# Patient Record
Sex: Male | Born: 1945 | Hispanic: No | Marital: Married | Smoking: Never smoker
Health system: Southern US, Community
[De-identification: ages and names within clinical notes are randomized; demographics above are authoritative.]

## PROBLEM LIST (undated history)

## (undated) DIAGNOSIS — B54 Unspecified malaria: Secondary | ICD-10-CM

## (undated) DIAGNOSIS — A159 Respiratory tuberculosis unspecified: Secondary | ICD-10-CM

## (undated) DIAGNOSIS — D649 Anemia, unspecified: Secondary | ICD-10-CM

## (undated) DIAGNOSIS — C9000 Multiple myeloma not having achieved remission: Secondary | ICD-10-CM

## (undated) DIAGNOSIS — K769 Liver disease, unspecified: Secondary | ICD-10-CM

## (undated) DIAGNOSIS — C801 Malignant (primary) neoplasm, unspecified: Secondary | ICD-10-CM

## (undated) DIAGNOSIS — R490 Dysphonia: Secondary | ICD-10-CM

## (undated) DIAGNOSIS — K5900 Constipation, unspecified: Secondary | ICD-10-CM

## (undated) DIAGNOSIS — H9190 Unspecified hearing loss, unspecified ear: Secondary | ICD-10-CM

## (undated) MED ORDER — SODIUM CHLORIDE 0.9 % IV SOLN
4.00 mg | Freq: Once | INTRAVENOUS | Status: AC
Start: 2016-08-02 — End: 2016-07-29

## (undated) MED ORDER — DIPHENHYDRAMINE HCL 50 MG/ML IJ SOLN
25.00 mg | Freq: Once | INTRAMUSCULAR | Status: AC | PRN
Start: 2017-12-15 — End: 2017-12-15

## (undated) MED ORDER — ALBUTEROL SULFATE (5 MG/ML) 0.5% IN NEBU
2.50 mg | INHALATION_SOLUTION | Freq: Once | RESPIRATORY_TRACT | Status: AC | PRN
Start: 2017-12-15 — End: 2017-12-15

## (undated) MED ORDER — SODIUM CHLORIDE 0.9 % IV SOLN
4.00 mg | Freq: Once | INTRAVENOUS | Status: AC
Start: 2016-10-21 — End: 2016-10-21

## (undated) MED ORDER — ACETAMINOPHEN 325 MG PO TABS
650.00 mg | ORAL_TABLET | Freq: Once | ORAL | Status: AC | PRN
Start: 2017-12-29 — End: 2017-12-29

## (undated) MED ORDER — SODIUM CHLORIDE 0.9 % IV SOLN
INTRAVENOUS | Status: AC
Start: 2017-11-03 — End: 2017-11-03

## (undated) MED ORDER — SODIUM CHLORIDE 0.9 % IV SOLN
12.00 mg | Freq: Once | INTRAVENOUS | Status: AC | PRN
Start: 2017-12-15 — End: 2017-12-15

## (undated) MED ORDER — CETIRIZINE HCL 10 MG OR TABS
10.00 mg | ORAL_TABLET | Freq: Once | ORAL | Status: AC | PRN
Start: 2017-12-15 — End: 2017-12-15

## (undated) MED ORDER — CETIRIZINE HCL 10 MG OR TABS
10.00 mg | ORAL_TABLET | Freq: Once | ORAL | Status: AC | PRN
Start: 2017-11-03 — End: 2017-11-03

## (undated) MED ORDER — SODIUM CHLORIDE 0.9 % IV SOLN
INTRAVENOUS | Status: AC
Start: 2017-12-15 — End: 2017-12-15

## (undated) MED ORDER — SODIUM CHLORIDE 0.9 % IV SOLN
INTRAVENOUS | Status: AC
Start: 2017-10-13 — End: 2017-10-13

## (undated) MED ORDER — ZOLEDRONIC ACID 4 MG/5ML IV CONC
4.00 mg | Freq: Once | INTRAVENOUS | Status: AC
Start: 2016-10-25 — End: 2016-10-25

## (undated) MED ORDER — SODIUM CHLORIDE 0.9 % IV SOLN
16.00 mg/kg | Freq: Once | INTRAVENOUS | Status: AC
Start: 2017-10-27 — End: 2017-10-27

## (undated) MED ORDER — DIPHENHYDRAMINE HCL 50 MG/ML IJ SOLN
25.00 mg | Freq: Once | INTRAMUSCULAR | Status: AC | PRN
Start: 2017-11-03 — End: 2017-11-03

## (undated) MED ORDER — MEPERIDINE HCL 25 MG/ML IJ SOLN
25.00 mg | Freq: Once | INTRAMUSCULAR | Status: AC | PRN
Start: 2017-11-03 — End: ?

## (undated) MED ORDER — CETIRIZINE HCL 10 MG OR TABS
10.00 mg | ORAL_TABLET | Freq: Once | ORAL | Status: AC
Start: 2017-10-13 — End: 2017-10-13

## (undated) MED ORDER — DIPHENHYDRAMINE HCL 50 MG/ML IJ SOLN
25.00 mg | Freq: Once | INTRAMUSCULAR | Status: AC | PRN
Start: 2017-11-17 — End: 2017-11-17

## (undated) MED ORDER — MEPERIDINE HCL 25 MG/ML IJ SOLN
25.00 mg | Freq: Once | INTRAMUSCULAR | Status: AC | PRN
Start: 2017-10-27 — End: ?

## (undated) MED ORDER — MEPERIDINE HCL 25 MG/ML IJ SOLN
25.00 mg | Freq: Once | INTRAMUSCULAR | Status: AC | PRN
Start: 2017-12-29 — End: ?

## (undated) MED ORDER — DIPHENHYDRAMINE HCL 50 MG/ML IJ SOLN
25.00 mg | Freq: Once | INTRAMUSCULAR | Status: AC | PRN
Start: 2017-12-29 — End: 2017-12-29

## (undated) MED ORDER — ACETAMINOPHEN 325 MG PO TABS
650.00 mg | ORAL_TABLET | Freq: Once | ORAL | Status: AC | PRN
Start: 2017-11-17 — End: 2017-11-17

## (undated) MED ORDER — SODIUM CHLORIDE 0.9 % IV SOLN
10.00 mL/h | Freq: Once | INTRAVENOUS | Status: AC
Start: 2016-08-02 — End: 2016-08-02

## (undated) MED ORDER — CETIRIZINE HCL 10 MG OR TABS
10.00 mg | ORAL_TABLET | Freq: Once | ORAL | Status: AC | PRN
Start: 2017-12-29 — End: 2017-12-29

## (undated) MED ORDER — ACETAMINOPHEN 325 MG PO TABS
650.00 mg | ORAL_TABLET | Freq: Once | ORAL | Status: AC
Start: 2017-10-13 — End: 2017-10-13

## (undated) MED ORDER — DIPHENHYDRAMINE HCL 50 MG/ML IJ SOLN
25.00 mg | Freq: Once | INTRAMUSCULAR | Status: AC | PRN
Start: 2017-10-20 — End: 2017-10-20

## (undated) MED ORDER — MEPERIDINE HCL 25 MG/ML IJ SOLN
25.00 mg | Freq: Once | INTRAMUSCULAR | Status: AC | PRN
Start: 2017-10-20 — End: ?

## (undated) MED ORDER — SODIUM CHLORIDE 0.9 % IV SOLN
INTRAVENOUS | Status: AC
Start: 2017-12-01 — End: 2017-12-01

## (undated) MED ORDER — ACETAMINOPHEN 325 MG PO TABS
650.00 mg | ORAL_TABLET | Freq: Once | ORAL | Status: AC | PRN
Start: 2017-12-01 — End: 2017-12-01

## (undated) MED ORDER — ALBUTEROL SULFATE (5 MG/ML) 0.5% IN NEBU
2.50 mg | INHALATION_SOLUTION | Freq: Once | RESPIRATORY_TRACT | Status: AC | PRN
Start: 2017-11-17 — End: 2017-11-17

## (undated) MED ORDER — MEPERIDINE HCL 25 MG/ML IJ SOLN
25.00 mg | Freq: Once | INTRAMUSCULAR | Status: AC | PRN
Start: 2017-12-01 — End: ?

## (undated) MED ORDER — SODIUM CHLORIDE 0.9 % IV BOLUS
250.00 mL | INJECTION | Freq: Once | INTRAVENOUS | Status: AC
Start: 2016-08-02 — End: 2016-08-02

## (undated) MED ORDER — DARATUMUMAB 400 MG/20ML IV SOLN
16.00 mg/kg | Freq: Once | INTRAVENOUS | Status: AC
Start: 2017-12-15 — End: 2017-12-15

## (undated) MED ORDER — SODIUM CHLORIDE 0.9 % IV BOLUS
250.00 mL | INJECTION | Freq: Once | INTRAVENOUS | Status: AC
Start: 2017-04-13 — End: 2017-04-13

## (undated) MED ORDER — DIPHENHYDRAMINE HCL 50 MG/ML IJ SOLN
25.00 mg | Freq: Once | INTRAMUSCULAR | Status: AC | PRN
Start: 2017-10-13 — End: 2017-10-13

## (undated) MED ORDER — MONTELUKAST SODIUM 10 MG OR TABS
10.00 mg | ORAL_TABLET | Freq: Once | ORAL | Status: AC | PRN
Start: 2017-10-20 — End: 2017-10-20

## (undated) MED ORDER — SODIUM CHLORIDE 0.9 % IV BOLUS
250.00 mL | INJECTION | Freq: Once | INTRAVENOUS | Status: AC
Start: 2017-01-19 — End: 2017-01-19

## (undated) MED ORDER — SODIUM CHLORIDE 0.9 % IV SOLN
INTRAVENOUS | Status: AC
Start: 2017-10-27 — End: 2017-10-27

## (undated) MED ORDER — SODIUM CHLORIDE 0.9 % IV BOLUS
250.00 mL | INJECTION | Freq: Once | INTRAVENOUS | Status: AC
Start: 2017-04-14 — End: 2017-04-14

## (undated) MED ORDER — SODIUM CHLORIDE 0.9 % IV SOLN
20.00 mg | Freq: Once | INTRAVENOUS | Status: AC
Start: 2017-10-13 — End: 2017-10-13

## (undated) MED ORDER — SODIUM CHLORIDE 0.9 % IV SOLN
10.00 mL/h | Freq: Once | INTRAVENOUS | Status: AC
Start: 2016-10-25 — End: 2016-10-25

## (undated) MED ORDER — ZOLEDRONIC ACID 4 MG/5ML IV CONC
4.00 mg | Freq: Once | INTRAVENOUS | Status: AC
Start: 2017-01-19 — End: 2017-01-19

## (undated) MED ORDER — CETIRIZINE HCL 10 MG OR TABS
10.00 mg | ORAL_TABLET | Freq: Once | ORAL | Status: AC | PRN
Start: 2017-10-27 — End: 2017-10-27

## (undated) MED ORDER — SODIUM CHLORIDE 0.9 % IV SOLN
16.00 mg/kg | Freq: Once | INTRAVENOUS | Status: AC
Start: 2017-12-01 — End: 2017-12-01

## (undated) MED ORDER — SODIUM CHLORIDE 0.9 % IV SOLN
4.00 mg | Freq: Once | INTRAVENOUS | Status: AC
Start: 2017-10-13 — End: 2017-05-15

## (undated) MED ORDER — DIPHENHYDRAMINE HCL 25 MG OR TABS OR CAPS CUSTOM
25.00 mg | ORAL_CAPSULE | Freq: Once | ORAL | Status: AC | PRN
Start: 2017-10-13 — End: 2017-10-13

## (undated) MED ORDER — SODIUM CHLORIDE 0.9 % IV SOLN
INTRAVENOUS | Status: AC
Start: 2017-10-20 — End: 2017-10-20

## (undated) MED ORDER — SODIUM CHLORIDE 0.9 % IV SOLN
10.00 mL/h | Freq: Once | INTRAVENOUS | Status: AC
Start: 2016-10-21 — End: 2016-10-21

## (undated) MED ORDER — ALBUTEROL SULFATE (5 MG/ML) 0.5% IN NEBU
2.50 mg | INHALATION_SOLUTION | Freq: Once | RESPIRATORY_TRACT | Status: AC | PRN
Start: 2017-10-13 — End: 2017-10-13

## (undated) MED ORDER — SODIUM CHLORIDE 0.9 % IV SOLN
16.00 mg/kg | Freq: Once | INTRAVENOUS | Status: AC
Start: 2017-10-13 — End: 2017-10-13

## (undated) MED ORDER — DIPHENHYDRAMINE HCL 50 MG/ML IJ SOLN
25.00 mg | Freq: Once | INTRAMUSCULAR | Status: AC | PRN
Start: 2017-10-27 — End: 2017-10-27

## (undated) MED ORDER — HYDROCORTISONE SOD SUCCINATE 100 MG IJ SOLR
100.00 mg | Freq: Once | INTRAMUSCULAR | Status: AC | PRN
Start: 2017-12-29 — End: 2017-12-29

## (undated) MED ORDER — ZOLEDRONIC ACID 4 MG/5ML IV CONC
4.00 mg | Freq: Once | INTRAVENOUS | Status: AC
Start: 2017-04-14 — End: 2017-04-14

## (undated) MED ORDER — ACETAMINOPHEN 325 MG PO TABS
650.00 mg | ORAL_TABLET | Freq: Once | ORAL | Status: AC | PRN
Start: 2017-10-20 — End: 2017-10-20

## (undated) MED ORDER — HYDROCORTISONE SOD SUCCINATE 100 MG IJ SOLR
100.00 mg | Freq: Once | INTRAMUSCULAR | Status: AC | PRN
Start: 2017-11-03 — End: 2017-11-03

## (undated) MED ORDER — MEPERIDINE HCL 25 MG/ML IJ SOLN
25.00 mg | Freq: Once | INTRAMUSCULAR | Status: AC | PRN
Start: 2017-10-13 — End: ?

## (undated) MED ORDER — SODIUM CHLORIDE 0.9 % IV SOLN
10.00 mL/h | Freq: Once | INTRAVENOUS | Status: AC
Start: 2017-01-19 — End: 2017-01-19

## (undated) MED ORDER — SODIUM CHLORIDE 0.9 % IV SOLN
10.00 mL/h | Freq: Once | INTRAVENOUS | Status: AC
Start: 2017-04-14 — End: 2017-04-14

## (undated) MED ORDER — MONTELUKAST SODIUM 10 MG OR TABS
10.00 mg | ORAL_TABLET | Freq: Once | ORAL | Status: AC
Start: 2017-10-13 — End: 2017-10-13

## (undated) MED ORDER — HYDROCORTISONE SOD SUCCINATE 100 MG IJ SOLR
100.00 mg | Freq: Once | INTRAMUSCULAR | Status: AC | PRN
Start: 2017-10-20 — End: 2017-10-20

## (undated) MED ORDER — SODIUM CHLORIDE 0.9 % IV BOLUS
250.00 mL | INJECTION | Freq: Once | INTRAVENOUS | Status: AC
Start: 2016-08-02 — End: 2016-07-29

## (undated) MED ORDER — DIPHENHYDRAMINE HCL 50 MG/ML IJ SOLN
25.00 mg | Freq: Once | INTRAMUSCULAR | Status: AC | PRN
Start: 2017-12-01 — End: 2017-12-01

## (undated) MED ORDER — SODIUM CHLORIDE 0.9 % IV SOLN
INTRAVENOUS | Status: AC
Start: 2017-12-29 — End: 2017-12-29

## (undated) MED ORDER — DARATUMUMAB 400 MG/20ML IV SOLN
16.00 mg/kg | Freq: Once | INTRAVENOUS | Status: AC
Start: 2017-12-29 — End: 2017-12-29

## (undated) MED ORDER — CETIRIZINE HCL 10 MG OR TABS
10.00 mg | ORAL_TABLET | Freq: Once | ORAL | Status: AC | PRN
Start: 2017-11-17 — End: 2017-11-17

## (undated) MED ORDER — SODIUM CHLORIDE 0.9 % IV SOLN
12.00 mg | Freq: Once | INTRAVENOUS | Status: AC | PRN
Start: 2017-10-27 — End: 2017-10-27

## (undated) MED ORDER — SODIUM CHLORIDE 0.9 % IV SOLN
20.00 mg | Freq: Once | INTRAVENOUS | Status: AC | PRN
Start: 2017-10-20 — End: 2017-10-20

## (undated) MED ORDER — SODIUM CHLORIDE 0.9 % IV BOLUS
250.00 mL | INJECTION | Freq: Once | INTRAVENOUS | Status: AC
Start: 2016-10-21 — End: 2016-10-21

## (undated) MED ORDER — SODIUM CHLORIDE 0.9 % IV SOLN
INTRAVENOUS | Status: AC
Start: 2017-11-17 — End: 2017-11-17

## (undated) MED ORDER — SODIUM CHLORIDE 0.9 % IV SOLN
10.00 mL/h | Freq: Once | INTRAVENOUS | Status: AC
Start: 2017-04-13 — End: 2017-04-13

## (undated) MED ORDER — HYDROCORTISONE SOD SUCCINATE 100 MG IJ SOLR
100.00 mg | Freq: Once | INTRAMUSCULAR | Status: AC | PRN
Start: 2017-12-01 — End: 2017-12-01

## (undated) MED ORDER — EPINEPHRINE 0.3 MG/0.3ML IJ SOAJ
0.30 mg | INTRAMUSCULAR | Status: AC | PRN
Start: 2017-11-17 — End: 2017-11-17

## (undated) MED ORDER — HYDROCORTISONE SOD SUCCINATE 100 MG IJ SOLR
100.00 mg | Freq: Once | INTRAMUSCULAR | Status: AC | PRN
Start: 2017-10-27 — End: 2017-10-27

## (undated) MED ORDER — EPINEPHRINE 0.3 MG/0.3ML IJ SOAJ
0.30 mg | INTRAMUSCULAR | Status: AC | PRN
Start: 2017-12-29 — End: 2017-12-29

## (undated) MED ORDER — DARATUMUMAB 400 MG/20ML IV SOLN
16.00 mg/kg | Freq: Once | INTRAVENOUS | Status: AC
Start: 2017-10-20 — End: 2017-10-20

## (undated) MED ORDER — ACETAMINOPHEN 325 MG PO TABS
650.00 mg | ORAL_TABLET | Freq: Once | ORAL | Status: AC | PRN
Start: 2017-12-15 — End: 2017-12-15

## (undated) MED ORDER — SODIUM CHLORIDE 0.9 % IV SOLN
12.00 mg | Freq: Once | INTRAVENOUS | Status: AC | PRN
Start: 2017-11-03 — End: 2017-11-03

## (undated) MED ORDER — MEPERIDINE HCL 25 MG/ML IJ SOLN
25.00 mg | Freq: Once | INTRAMUSCULAR | Status: AC | PRN
Start: 2017-12-15 — End: ?

## (undated) MED ORDER — ALBUTEROL SULFATE (5 MG/ML) 0.5% IN NEBU
2.50 mg | INHALATION_SOLUTION | Freq: Once | RESPIRATORY_TRACT | Status: AC | PRN
Start: 2017-10-20 — End: 2017-10-20

## (undated) MED ORDER — HYDROCORTISONE SOD SUCCINATE 100 MG IJ SOLR
100.00 mg | Freq: Once | INTRAMUSCULAR | Status: AC | PRN
Start: 2017-10-13 — End: 2017-10-13

## (undated) MED ORDER — EPINEPHRINE 0.3 MG/0.3ML IJ SOAJ
0.30 mg | INTRAMUSCULAR | Status: AC | PRN
Start: 2017-12-01 — End: 2017-12-01

## (undated) MED ORDER — HYDROCORTISONE SOD SUCCINATE 100 MG IJ SOLR
100.00 mg | Freq: Once | INTRAMUSCULAR | Status: AC | PRN
Start: 2017-11-17 — End: 2017-11-17

## (undated) MED ORDER — CETIRIZINE HCL 10 MG OR TABS
10.00 mg | ORAL_TABLET | Freq: Once | ORAL | Status: AC | PRN
Start: 2017-10-20 — End: 2017-10-20

## (undated) MED ORDER — SODIUM CHLORIDE 0.9 % IV BOLUS
250.00 mL | INJECTION | Freq: Once | INTRAVENOUS | Status: AC
Start: 2017-10-13 — End: 2017-05-15

## (undated) MED ORDER — ALBUTEROL SULFATE (5 MG/ML) 0.5% IN NEBU
2.50 mg | INHALATION_SOLUTION | Freq: Once | RESPIRATORY_TRACT | Status: AC | PRN
Start: 2017-10-27 — End: 2017-10-27

## (undated) MED ORDER — SODIUM CHLORIDE 0.9 % IV SOLN
4.00 mg | Freq: Once | INTRAVENOUS | Status: AC
Start: 2016-08-02 — End: 2016-08-02

## (undated) MED ORDER — SODIUM CHLORIDE 0.9 % IV SOLN
10.00 mL/h | Freq: Once | INTRAVENOUS | Status: AC
Start: 2016-08-02 — End: 2016-07-29

## (undated) MED ORDER — DEXAMETHASONE SODIUM PHOSPHATE 10 MG/ML IJ SOLN
12.00 mg | Freq: Once | INTRAMUSCULAR | Status: AC | PRN
Start: 2017-12-01 — End: 2017-12-01

## (undated) MED ORDER — ZOLEDRONIC ACID 4 MG/5ML IV CONC
4.00 mg | Freq: Once | INTRAVENOUS | Status: AC
Start: 2016-10-21 — End: 2016-10-21

## (undated) MED ORDER — ACETAMINOPHEN 325 MG PO TABS
650.00 mg | ORAL_TABLET | Freq: Once | ORAL | Status: AC | PRN
Start: 2017-11-03 — End: 2017-11-03

## (undated) MED ORDER — EPINEPHRINE 0.3 MG/0.3ML IJ SOAJ
0.30 mg | INTRAMUSCULAR | Status: AC | PRN
Start: 2017-11-03 — End: 2017-11-03

## (undated) MED ORDER — DARATUMUMAB 400 MG/20ML IV SOLN
16.00 mg/kg | Freq: Once | INTRAVENOUS | Status: AC
Start: 2017-11-17 — End: 2017-11-17

## (undated) MED ORDER — ACETAMINOPHEN 325 MG PO TABS
650.00 mg | ORAL_TABLET | Freq: Once | ORAL | Status: AC | PRN
Start: 2017-10-27 — End: 2017-10-27

## (undated) MED ORDER — SODIUM CHLORIDE 0.9 % IV BOLUS
250.00 mL | INJECTION | Freq: Once | INTRAVENOUS | Status: AC
Start: 2016-10-25 — End: 2016-10-25

## (undated) MED ORDER — DEXAMETHASONE SODIUM PHOSPHATE 10 MG/ML IJ SOLN
12.00 mg | Freq: Once | INTRAMUSCULAR | Status: AC | PRN
Start: 2017-12-29 — End: 2017-12-29

## (undated) MED ORDER — ZOLEDRONIC ACID 4 MG/5ML IV CONC
4.00 mg | Freq: Once | INTRAVENOUS | Status: AC
Start: 2017-04-13 — End: 2017-04-13

## (undated) MED ORDER — SODIUM CHLORIDE 0.9 % IV SOLN
16.00 mg/kg | Freq: Once | INTRAVENOUS | Status: AC
Start: 2017-11-03 — End: 2017-11-03

## (undated) MED ORDER — EPINEPHRINE 0.3 MG/0.3ML IJ SOAJ
.30 mg | INTRAMUSCULAR | Status: AC | PRN
Start: 2017-10-27 — End: 2017-10-27

## (undated) MED ORDER — SODIUM CHLORIDE 0.9 % IV SOLN
12.00 mg | Freq: Once | INTRAVENOUS | Status: AC | PRN
Start: 2017-11-17 — End: 2017-11-17

## (undated) MED ORDER — CETIRIZINE HCL 10 MG OR TABS
10.00 mg | ORAL_TABLET | Freq: Once | ORAL | Status: AC | PRN
Start: 2017-12-01 — End: 2017-12-01

## (undated) MED ORDER — EPINEPHRINE 0.3 MG/0.3ML IJ SOAJ
0.30 mg | INTRAMUSCULAR | Status: AC | PRN
Start: 2017-12-15 — End: 2017-12-15

## (undated) MED ORDER — MEPERIDINE HCL 25 MG/ML IJ SOLN
25.00 mg | Freq: Once | INTRAMUSCULAR | Status: AC | PRN
Start: 2017-11-17 — End: ?

## (undated) MED ORDER — EPINEPHRINE 0.3 MG/0.3ML IJ SOAJ
.30 mg | INTRAMUSCULAR | Status: AC | PRN
Start: 2017-10-20 — End: 2017-10-20

## (undated) MED ORDER — ALBUTEROL SULFATE (5 MG/ML) 0.5% IN NEBU
2.50 mg | INHALATION_SOLUTION | Freq: Once | RESPIRATORY_TRACT | Status: AC | PRN
Start: 2017-12-29 — End: 2017-12-29

## (undated) MED ORDER — ALBUTEROL SULFATE (5 MG/ML) 0.5% IN NEBU
2.50 mg | INHALATION_SOLUTION | Freq: Once | RESPIRATORY_TRACT | Status: AC | PRN
Start: 2017-12-01 — End: 2017-12-01

## (undated) MED ORDER — HYDROCORTISONE SOD SUCCINATE 100 MG IJ SOLR
100.00 mg | Freq: Once | INTRAMUSCULAR | Status: AC | PRN
Start: 2017-12-15 — End: 2017-12-15

## (undated) MED ORDER — EPINEPHRINE 0.3 MG/0.3ML IJ SOAJ
.30 mg | INTRAMUSCULAR | Status: AC | PRN
Start: 2017-10-13 — End: 2017-10-13

## (undated) MED ORDER — ALBUTEROL SULFATE (5 MG/ML) 0.5% IN NEBU
2.50 mg | INHALATION_SOLUTION | Freq: Once | RESPIRATORY_TRACT | Status: AC | PRN
Start: 2017-11-03 — End: 2017-11-03

---

## 1983-07-12 DIAGNOSIS — A159 Respiratory tuberculosis unspecified: Secondary | ICD-10-CM

## 1983-07-12 HISTORY — DX: Respiratory tuberculosis unspecified: A15.9

## 2001-07-11 DIAGNOSIS — B54 Unspecified malaria: Secondary | ICD-10-CM

## 2001-07-11 HISTORY — DX: Unspecified malaria: B54

## 2004-07-11 HISTORY — PX: RETINAL DETACHMENT SURGERY: SHX105

## 2016-03-07 ENCOUNTER — Ambulatory Visit
Admission: RE | Admit: 2016-03-07 | Discharge: 2016-03-07 | Disposition: A | Discharge status: Home / Self Care | Payer: Medicare Other | Source: Ambulatory Visit | Attending: Family Medicine | Admitting: Family Medicine

## 2016-03-07 ENCOUNTER — Other Ambulatory Visit: Payer: Self-pay | Admitting: Family Medicine

## 2016-03-07 DIAGNOSIS — R0689 Other abnormalities of breathing: Secondary | ICD-10-CM

## 2016-03-07 DIAGNOSIS — Z8611 Personal history of tuberculosis: Secondary | ICD-10-CM

## 2016-03-07 DIAGNOSIS — R0600 Dyspnea, unspecified: Secondary | ICD-10-CM

## 2016-03-07 DIAGNOSIS — M25551 Pain in right hip: Secondary | ICD-10-CM

## 2016-03-07 DIAGNOSIS — J449 Chronic obstructive pulmonary disease, unspecified: Secondary | ICD-10-CM | POA: Insufficient documentation

## 2016-03-10 ENCOUNTER — Other Ambulatory Visit: Payer: Self-pay | Admitting: Family Medicine

## 2016-03-10 DIAGNOSIS — R9389 Abnormal findings on diagnostic imaging of other specified body structures: Secondary | ICD-10-CM

## 2016-03-15 ENCOUNTER — Ambulatory Visit
Admission: RE | Admit: 2016-03-15 | Discharge: 2016-03-15 | Disposition: A | Discharge status: Home / Self Care | Payer: Medicare Other | Source: Ambulatory Visit | Attending: Family Medicine | Admitting: Family Medicine

## 2016-03-15 DIAGNOSIS — M899 Disorder of bone, unspecified: Secondary | ICD-10-CM | POA: Diagnosis not present

## 2016-03-15 DIAGNOSIS — K769 Liver disease, unspecified: Secondary | ICD-10-CM | POA: Insufficient documentation

## 2016-03-15 DIAGNOSIS — M8580 Other specified disorders of bone density and structure, unspecified site: Secondary | ICD-10-CM | POA: Diagnosis not present

## 2016-03-15 DIAGNOSIS — D492 Neoplasm of unspecified behavior of bone, soft tissue, and skin: Secondary | ICD-10-CM | POA: Insufficient documentation

## 2016-03-15 DIAGNOSIS — R938 Abnormal findings on diagnostic imaging of other specified body structures: Secondary | ICD-10-CM | POA: Insufficient documentation

## 2016-03-15 DIAGNOSIS — M4804 Spinal stenosis, thoracic region: Secondary | ICD-10-CM | POA: Diagnosis not present

## 2016-03-15 DIAGNOSIS — J984 Other disorders of lung: Secondary | ICD-10-CM | POA: Diagnosis not present

## 2016-03-15 DIAGNOSIS — M8448XA Pathological fracture, other site, initial encounter for fracture: Secondary | ICD-10-CM | POA: Diagnosis not present

## 2016-03-15 DIAGNOSIS — J439 Emphysema, unspecified: Secondary | ICD-10-CM | POA: Insufficient documentation

## 2016-03-15 DIAGNOSIS — R9389 Abnormal findings on diagnostic imaging of other specified body structures: Secondary | ICD-10-CM

## 2016-03-15 MED ORDER — IOPAMIDOL (ISOVUE-300) INJECTION 61%
75.0000 mL | Freq: Once | INTRAVENOUS | Status: AC | PRN
  Administered 2016-03-15: 75 mL via INTRAVENOUS

## 2016-03-16 ENCOUNTER — Inpatient Hospital Stay: Payer: Medicare Other | Attending: Internal Medicine | Admitting: Internal Medicine

## 2016-03-16 ENCOUNTER — Encounter: Payer: Self-pay | Admitting: Radiation Oncology

## 2016-03-16 ENCOUNTER — Ambulatory Visit
Admission: RE | Admit: 2016-03-16 | Discharge: 2016-03-16 | Disposition: A | Discharge status: Home / Self Care | Payer: Medicare Other | Source: Ambulatory Visit | Attending: Internal Medicine | Admitting: Internal Medicine

## 2016-03-16 ENCOUNTER — Encounter: Payer: Self-pay | Admitting: Internal Medicine

## 2016-03-16 ENCOUNTER — Other Ambulatory Visit: Payer: Self-pay

## 2016-03-16 ENCOUNTER — Encounter (INDEPENDENT_AMBULATORY_CARE_PROVIDER_SITE_OTHER): Payer: Self-pay

## 2016-03-16 ENCOUNTER — Ambulatory Visit
Admission: RE | Admit: 2016-03-16 | Discharge: 2016-03-16 | Disposition: A | Discharge status: Home / Self Care | Payer: Medicare Other | Source: Ambulatory Visit | Attending: Radiation Oncology | Admitting: Radiation Oncology

## 2016-03-16 ENCOUNTER — Inpatient Hospital Stay: Payer: Medicare Other

## 2016-03-16 ENCOUNTER — Institutional Professional Consult (permissible substitution): Payer: Medicaid Other | Admitting: Radiation Oncology

## 2016-03-16 VITALS — BP 114/71 | HR 94 | Temp 97.8°F | Resp 20 | Wt 106.0 lb

## 2016-03-16 DIAGNOSIS — M899 Disorder of bone, unspecified: Secondary | ICD-10-CM | POA: Diagnosis not present

## 2016-03-16 DIAGNOSIS — Z510 Encounter for antineoplastic radiation therapy: Secondary | ICD-10-CM | POA: Insufficient documentation

## 2016-03-16 DIAGNOSIS — Z8611 Personal history of tuberculosis: Secondary | ICD-10-CM

## 2016-03-16 DIAGNOSIS — J439 Emphysema, unspecified: Secondary | ICD-10-CM | POA: Diagnosis not present

## 2016-03-16 DIAGNOSIS — Z8613 Personal history of malaria: Secondary | ICD-10-CM | POA: Insufficient documentation

## 2016-03-16 DIAGNOSIS — J449 Chronic obstructive pulmonary disease, unspecified: Secondary | ICD-10-CM | POA: Diagnosis not present

## 2016-03-16 DIAGNOSIS — Z5111 Encounter for antineoplastic chemotherapy: Secondary | ICD-10-CM | POA: Insufficient documentation

## 2016-03-16 DIAGNOSIS — R1310 Dysphagia, unspecified: Secondary | ICD-10-CM

## 2016-03-16 DIAGNOSIS — D649 Anemia, unspecified: Secondary | ICD-10-CM | POA: Insufficient documentation

## 2016-03-16 DIAGNOSIS — R0602 Shortness of breath: Secondary | ICD-10-CM | POA: Insufficient documentation

## 2016-03-16 DIAGNOSIS — D510 Vitamin B12 deficiency anemia due to intrinsic factor deficiency: Secondary | ICD-10-CM

## 2016-03-16 DIAGNOSIS — R630 Anorexia: Secondary | ICD-10-CM | POA: Insufficient documentation

## 2016-03-16 DIAGNOSIS — R490 Dysphonia: Secondary | ICD-10-CM | POA: Insufficient documentation

## 2016-03-16 DIAGNOSIS — R634 Abnormal weight loss: Secondary | ICD-10-CM | POA: Insufficient documentation

## 2016-03-16 DIAGNOSIS — Z79899 Other long term (current) drug therapy: Secondary | ICD-10-CM | POA: Insufficient documentation

## 2016-03-16 DIAGNOSIS — C9000 Multiple myeloma not having achieved remission: Secondary | ICD-10-CM | POA: Diagnosis not present

## 2016-03-16 DIAGNOSIS — I2510 Atherosclerotic heart disease of native coronary artery without angina pectoris: Secondary | ICD-10-CM | POA: Diagnosis not present

## 2016-03-16 DIAGNOSIS — K769 Liver disease, unspecified: Secondary | ICD-10-CM | POA: Insufficient documentation

## 2016-03-16 DIAGNOSIS — C9002 Multiple myeloma in relapse: Secondary | ICD-10-CM | POA: Insufficient documentation

## 2016-03-16 DIAGNOSIS — M549 Dorsalgia, unspecified: Secondary | ICD-10-CM | POA: Diagnosis not present

## 2016-03-16 DIAGNOSIS — D1800 Hemangioma unspecified site: Secondary | ICD-10-CM

## 2016-03-16 DIAGNOSIS — M8580 Other specified disorders of bone density and structure, unspecified site: Secondary | ICD-10-CM | POA: Diagnosis not present

## 2016-03-16 DIAGNOSIS — R509 Fever, unspecified: Secondary | ICD-10-CM | POA: Insufficient documentation

## 2016-03-16 DIAGNOSIS — C7951 Secondary malignant neoplasm of bone: Secondary | ICD-10-CM | POA: Diagnosis not present

## 2016-03-16 DIAGNOSIS — M25551 Pain in right hip: Secondary | ICD-10-CM

## 2016-03-16 DIAGNOSIS — C649 Malignant neoplasm of unspecified kidney, except renal pelvis: Secondary | ICD-10-CM | POA: Diagnosis not present

## 2016-03-16 DIAGNOSIS — K5900 Constipation, unspecified: Secondary | ICD-10-CM | POA: Insufficient documentation

## 2016-03-16 DIAGNOSIS — C801 Malignant (primary) neoplasm, unspecified: Secondary | ICD-10-CM | POA: Insufficient documentation

## 2016-03-16 DIAGNOSIS — R140 Abdominal distension (gaseous): Secondary | ICD-10-CM

## 2016-03-16 HISTORY — DX: Unspecified malaria: B54

## 2016-03-16 HISTORY — DX: Liver disease, unspecified: K76.9

## 2016-03-16 HISTORY — DX: Anemia, unspecified: D64.9

## 2016-03-16 HISTORY — DX: Respiratory tuberculosis unspecified: A15.9

## 2016-03-16 HISTORY — DX: Unspecified hearing loss, unspecified ear: H91.90

## 2016-03-16 HISTORY — DX: Malignant (primary) neoplasm, unspecified: C80.1

## 2016-03-16 LAB — LACTATE DEHYDROGENASE: LDH: 214 U/L — AB (ref 98–192)

## 2016-03-16 LAB — FOLATE: Folate: 19.7 ng/mL (ref >5.9)

## 2016-03-16 LAB — VITAMIN B12: Vitamin B-12: 175 pg/mL — ABNORMAL LOW (ref 180–914)

## 2016-03-16 MED ORDER — HYDROCODONE-ACETAMINOPHEN 5-325 MG PO TABS: 1.0000 | ORAL_TABLET | Freq: Four times a day (QID) | ORAL | 0 refills | Status: DC | PRN

## 2016-03-16 NOTE — Assessment & Plan Note (Addendum)
Multiple lytic lesions- soft tissue mass ninth posterior rib. I reviewed the images myself ; and with the patient and family. Given the anemia hemoglobin 10 normocytic; elevated globulin; highly suspicious for multiple myeloma. No evidence of renal dysfunction or hypercalcemia on the chemistries.  # Recommend a PET scan for further evaluation. Recommend getting a biopsy- a bone marrow biopsy versus soft tissue CT-guided biopsy of the posterior ninth rib lesion. If elevated Lambda light chain- recommend bone marrow biopsy.   # Bone pain- multiple lytic lesions- if positive for multiple myeloma recommend Zometa. Also recommend hydrocodone every 4-6 hours for pain control. Patient being evaluated by Dr. Donella Stade planning starting radiation for symptomatic bone metastases.  # With regards to prognosis/treatment difficult to address at this time- as we are awaiting the confirmation of the above malignancy.  The above plan of care was discussed with the patient and family [son-in-law] in detail. Patient is tentatively follow-up with me in approximately 2 weeks; or sooner based upon above workup. Plan was also discussed with Dr. Donella Stade.  #  [phone] home; Barbara- [place] can leave message.

## 2016-03-16 NOTE — Progress Notes (Signed)
[facility] NOTE  Patient Care Team: Alfonse Flavors, MD as PCP - General (Family Medicine)  [name]; [facility]  CHIEF COMPLAINTS/PURPOSE OF CONSULTATION:  Multiple lytic lesions  #  Oncology History   # Multiple lytic lesions- Thoracic spine/ vertebral compression fractures-? multiple myeloma vs other.  # Abnormal CXR;? 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  male from [place] a chest x-ray given worsening Back pain and weight loss with his PCP. The chest x-ray was abnormal- which led to a CAT scan that showed multiple lytic lesions in the thoracic vertebrae compression fractures; and also expansile lesion posterior ninth rib soft tissue metastases. Also showed- few liver lesions. He has been referred to oncology for further evaluation and recommendations.   Interestingly patient noted have hoarseness of voice in the last 2-3 months. Also noted to have low-grade fever up to 99 to 100 for the last few weeks. Intermittent constipation and bloating. He denies any headaches. Denies any unusual shortness of breath or cough or chills. Poor appetite.    ROS: A complete 10 point review of system is done which is negative except mentioned above in history of present illness  MEDICAL HISTORY:  Past Medical History:  Diagnosis Date  . Anemia   . Cancer (HCC)    Bone metastasis  . Hearing loss   . Liver lesion   . Malaria 2003  . Tuberculosis     SURGICAL HISTORY: Past Surgical History:  Procedure Laterality Date  . 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 No smoking or alcohol.  Social History   Social History  . Marital status: Married    Spouse name: N/A  . Number of children: N/A  . Years of education: N/A   Occupational History  . Not on file.   Social  History Main Topics  . Smoking status: Never Smoker  . Smokeless tobacco: Never Used  . Alcohol use No  . Drug use: No  . Sexual activity: Not on file   Other Topics Concern  . Not on file   Social History Narrative  . No narrative on file    FAMILY HISTORY: no history of cancers in the family. History reviewed. No pertinent family history.  ALLERGIES:  has No Known Allergies.  MEDICATIONS:  Current Outpatient Prescriptions  Medication Sig Dispense Refill  . Vitamin D, Ergocalciferol, (DRISDOL) 50000 units CAPS capsule Take 50,000 Units by mouth every 7 (seven) days.    . ferrous sulfate 325 (65 FE) MG tablet Take 325 mg by mouth daily with breakfast.    . HYDROcodone-acetaminophen (NORCO/VICODIN) 5-325 MG tablet Take 1 tablet by mouth every 6 (six) hours as needed for moderate pain. 60 tablet 0   No current facility-administered medications for this visit.       Marland Kitchen  PHYSICAL EXAMINATION: ECOG PERFORMANCE STATUS: 1 - Symptomatic but completely ambulatory  There were no vitals filed for this visit. There were no vitals filed for this visit.  GENERAL: Thin built moderately nourished male patient; Alert, no distress and comfortable.   With family.  EYES: no pallor or icterus OROPHARYNX: no thrush or ulceration; good dentition  NECK: supple, no masses felt LYMPH:  no palpable lymphadenopathy in the cervical, axillary or inguinal regions LUNGS: clear to auscultation and  No wheeze or crackles HEART/CVS:  regular rate & rhythm and no murmurs; No lower extremity edema ABDOMEN: abdomen soft, non-tender and normal bowel sounds Musculoskeletal:no cyanosis of digits and no clubbing  PSYCH: alert & oriented x 3 with fluent speech NEURO: no focal motor/sensory deficits SKIN:  no rashes or significant lesions  LABORATORY DATA:  I have reviewed the data as listed No results found for: WBC, HGB, HCT, MCV, PLT No results for input(s): NA, K, CL, CO2, GLUCOSE, BUN, CREATININE,  CALCIUM, GFRNONAA, GFRAA, PROT, ALBUMIN, AST, ALT, ALKPHOS, BILITOT, BILIDIR, IBILI in the last 8760 hours.  RADIOGRAPHIC STUDIES: I have personally reviewed the radiological images as listed and agreed with the findings in the report. Dg Chest 2 View  Result Date: 03/07/2016 CLINICAL DATA:  Shortness of breath for 1 month. History of tuberculosis. EXAM: CHEST  2 VIEW COMPARISON:  None. FINDINGS: There is hyperinflation of the lungs compatible with COPD. Biapical scarring. Heart is normal size. No effusions. Rounded masslike pleural-based density noted posteriorly in the mid chest on the lateral view, not well seen on the frontal view. IMPRESSION: Rounded masslike posterior pleural based density on the lateral view. Recommend chest CT for further evaluation. COPD/ chronic changes. Electronically Signed   By: Rolm Baptise M.D.   On: 03/07/2016 11:46   Ct Chest W Contrast  Result Date: 03/15/2016 CLINICAL DATA:  Abnormal chest x-ray.  Terrible back pain. EXAM: CT CHEST WITH CONTRAST TECHNIQUE: Multidetector CT imaging of the chest was performed during intravenous contrast administration. CONTRAST:  35m ISOVUE-300 IOPAMIDOL (ISOVUE-300) INJECTION 61% COMPARISON:  Two-view chest x-ray 03/07/2016 FINDINGS: Cardiovascular: The heart size is normal. No significant coronary artery calcifications are present. There is no significant pericardial effusion. Mediastinum/Nodes: No significant mediastinal or axillary adenopathy is present. Atherosclerotic calcifications are present at the aortic arch. Lungs/Pleura: Biapical pleural parenchymal scarring is present. There is upward traction on the hila bilaterally. Emphysematous changes are noted. No focal nodule, mass, or airspace disease is evident. Upper Abdomen: Multiple hypodense lesions are present within the liver. The largest lesion is in segment 4 A measuring 14 mm. This may be a cyst. There are other more subtle hypodense lesions. Most worrisome is a far right  lateral lesion in segment 7 on image 62 of series 2. This measures 15 mm. The visualized abdominal organs are otherwise unremarkable. Musculoskeletal: A large soft tissue lesion is centered along the posterior left ninth rib measuring 5.7 x 3.3 x 4.5 cm. There is no significant invasion of the spinal canal although the lesion does extend to the left T8-9 foramen. There is tumor invasion and the posterior elements of T9 on the left. An additional more lateral left T9 lytic lesion and pathologic fracture is noted. Focal irregularity of the posterior left T12 rib the likely represents a metastatic lesion as well. The pathologic fractures present at T10 with extraosseous tumor extending into the spinal canal at T10 and to the right. There is tumor in both feet T9-10 and T10-11 foramen on the right. Marked osteopenia is present. There is lytic tumor involving the posterior elements on the right at T3 with probable involvement of the right T3-4 foramen. There is also a lytic lesion anteriorly within the T3 vertebral body without collapse. A far right T4 lesion measures 14 mm without collapse. In anterior lesion in T6 measures 14 mm. A 9 mm lesion is present within the right posterior element of T9. Infiltrative tumor is suspected posteriorly at T11 without collapse or extraosseous extension of tumor. Multiple other smaller spinous lesions  are suspected. MRI of the thoracic spine without and with contrast would be useful for further evaluation. IMPRESSION: 1. Multiple lytic lesions throughout the thoracic spine and ribs as described suggesting metastatic disease. 2. The lesion evident on the chest x-ray corresponds with a 5.7 x 3.3 x 4.5 cm mass infiltrating the left T9 rib with extension to the left T8-9 foramen but no invasion of the foramen. 3. 4.1 x 3.1 cm exophytic mass lesion at T10 with pathologic fracture. The tumor extends into the spinal canal with moderate central canal stenosis and right greater than left  foraminal narrowing at T9-10 and T10-11. 4. Prominent infiltrative tumor posteriorly at T11 without compression fracture. 5. Additional pathologic fracture within the more distal left ninth rib. 6. Marked osteopenia with probable hemangioma is an high suspicion for multiple additional lesions throughout the thoracic spine. Recommend MRI of the thoracic spine without and with contrast for further evaluation 7. Multiple hypodense lesions within the liver also raise concern for metastatic disease. Dedicated CT of the abdomen and pelvis with contrast is recommended for further evaluation of metastatic disease and possible primary source. 8. Emphysema and biapical scarring in the lungs without evidence for primary tumor. These results were called by telephone at the time of interpretation on 03/15/2016 at 11:22 am to [facility], [place], who verbally acknowledged these results. Electronically Signed   By: San Morelle M.D.   On: 03/15/2016 11:44   Dg Bone Survey Met  Result Date: 03/16/2016 CLINICAL DATA:  Multiple myeloma EXAM: METASTATIC BONE SURVEY COMPARISON:  03/15/2016 FINDINGS: Lateral view of the skull shows no lytic or blastic lesion. Lateral view of the cervical spine shows diffuse osteopenia. No lytic or blastic lesions. Mild disc space flattening at C3-C4-C4-C5 and C5-C6 level. Frontal view bilateral shoulder shows no lytic or blastic lesion. Frontal view of the neck shows no lytic or blastic lesion. Bilateral humerus demonstrates no lytic or blastic lesion. Bilateral forearm shows no lytic or blastic lesions. There is dextroscoliosis lower thoracic spine. Mild levoscoliosis lumbar spine. Significant compression fracture of T10 vertebral body suspicious for pathologic fracture. Expansile lesion of the left eighth rib suspicious for metastatic disease. Frontal view of the pelvis shows no lytic or blastic lesions. Frontal view bilateral hip and proximal femur is unremarkable. Frontal view bilateral femur  shows no lytic or blastic lesions. Bilateral tibia and fibula shows no lytic or blastic lesions. IMPRESSION: 1. There is probable pathologic fracture of T10 vertebral body suspicious for bony lesion. Expansile lesion of left posterior eighth rib suspicious for metastatic disease. Please see patient's CT scan of the chest from yesterday. Electronically Signed   By: Lahoma Crocker M.D.   On: 03/16/2016 17:00   Dg Hip Unilat With Pelvis 2-3 Views Right  Result Date: 03/07/2016 CLINICAL DATA:  Right hip pain.  No known injury. EXAM: DG HIP (WITH OR WITHOUT PELVIS) 2-3V RIGHT COMPARISON:  None. FINDINGS: Early degenerative changes in the hips bilaterally with early spurring. Joint space is maintained. No acute bony abnormality. Specifically, no fracture, subluxation, or dislocation. Soft tissues are intact. SI joints are symmetric and unremarkable. IMPRESSION: No acute bony abnormality. Electronically Signed   By: Rolm Baptise M.D.   On: 03/07/2016 11:46    ASSESSMENT & PLAN:   Multiple myeloma in relapse ([place]) Multiple lytic lesions- soft tissue mass ninth posterior rib. I reviewed the images myself ; and with the patient and family. Given the anemia hemoglobin 10 normocytic; elevated globulin; highly suspicious for multiple myeloma. No evidence  of renal dysfunction or hypercalcemia on the chemistries.  # Recommend a PET scan for further evaluation. Recommend getting a biopsy- a bone marrow biopsy versus soft tissue CT-guided biopsy of the posterior ninth rib lesion. If elevated Lambda light chain- recommend bone marrow biopsy.   # Bone pain- multiple lytic lesions- if positive for multiple myeloma recommend Zometa. Also recommend hydrocodone every 4-6 hours for pain control. Patient being evaluated by Dr. Donella Stade planning starting radiation for symptomatic bone metastases.  # With regards to prognosis/treatment difficult to address at this time- as we are awaiting the confirmation of the above  malignancy.  The above plan of care was discussed with the patient and family [son-in-law] in detail. Patient is tentatively follow-up with me in approximately 2 weeks; or sooner based upon above workup. Plan was also discussed with Dr. Donella Stade.  #  [phone] home; Barbara- [place] can leave message.   All questions were answered. The patient knows to call the clinic with any problems, questions or concerns.    Cammie Sickle, MD 03/16/2016 6:35 PM

## 2016-03-16 NOTE — Consult Note (Signed)
Except an outstanding is perfect of Radiation Oncology NEW PATIENT EVALUATION  Name: Francisco [name]  MRN: [id number]  Date:   03/16/2016     DOB: [birth year]   This 70 y.o. male patient presents to the clinic for initial evaluation of thoracic spine metastasis from tumor as of yet unknown primary.  REFERRING PHYSICIAN: Zhou-Talbert, Elwyn Lade,*  CHIEF COMPLAINT:  Chief Complaint  Patient presents with  . Cancer    Pt is here for initial consultation of bone metastasis    DIAGNOSIS: The encounter diagnosis was Bone metastasis ([place]).   PREVIOUS INVESTIGATIONS:  CT scans reviewed Clinical notes reviewed CT-guided biopsy for histologic confirmation will be ordered  HPI: Patient is a 70 year old male who has presented with increasing shortness of breath and severe back pain over the past several months. He denies any evidence of motor or sensory levels. CT scan was performed showing multiple areas of lytic lesions throughout the thoracic spine and ribs suggestive of metastatic disease. There was a 6 cm mass of treatment left T9 rib as well as a 4 cm mass with pathologic fracture of T10. Also had prominent infiltrative tumor posteriorly at T11 patient also had multiple hypodense lesions within the liver concerning for metastatic disease. I was asked to see the patient for consideration of palliative treatment. He is scheduled to see medical oncology this afternoon and CT scan guided biopsy of one of his chest lesions will be ordered for histologic confirmation of malignancy. Patient specifically denies hemoptysis. He is ambulating well.  PLANNED TREATMENT REGIMEN: Palliative radiation therapy to thoracic spine  PAST MEDICAL HISTORY:  has a past medical history of Anemia; Cancer ([place]); Hearing loss; Liver lesion; Malaria (2003); and Tuberculosis.    PAST SURGICAL HISTORY:  Past Surgical History:  Procedure Laterality Date  . RETINAL DETACHMENT SURGERY  2006    FAMILY HISTORY: family  history is not on file.  SOCIAL HISTORY:  reports that he has never smoked. He has never used smokeless tobacco. He reports that he does not drink alcohol or use drugs.  ALLERGIES: Review of patient's allergies indicates no known allergies.  MEDICATIONS:  Current Outpatient Prescriptions  Medication Sig Dispense Refill  . ferrous sulfate 325 (65 FE) MG tablet Take 325 mg by mouth daily with breakfast.    . Vitamin D, Ergocalciferol, (DRISDOL) 50000 units CAPS capsule Take 50,000 Units by mouth every 7 (seven) days.     No current facility-administered medications for this encounter.     ECOG PERFORMANCE STATUS:  1 - Symptomatic but completely ambulatory  REVIEW OF SYSTEMS: Except for the significant pain in his back and increased shortness of breath Patient denies any weight loss, fatigue, weakness, fever, chills or night sweats. Patient denies any loss of vision, blurred vision. Patient denies any ringing  of the ears or hearing loss. No irregular heartbeat. Patient denies heart murmur or history of fainting. Patient denies any chest pain or pain radiating to her upper extremities. Patient denies any shortness of breath, difficulty breathing at night, cough or hemoptysis. Patient denies any swelling in the lower legs. Patient denies any nausea vomiting, vomiting of blood, or coffee ground material in the vomitus. Patient denies any stomach pain. Patient states has had normal bowel movements no significant constipation or diarrhea. Patient denies any dysuria, hematuria or significant nocturia. Patient denies any problems walking, swelling in the joints or loss of balance. Patient denies any skin changes, loss of hair or loss of weight. Patient denies any excessive worrying or anxiety  or significant depression. Patient denies any problems with insomnia. Patient denies excessive thirst, polyuria, polydipsia. Patient denies any swollen glands, patient denies easy bruising or easy bleeding. Patient  denies any recent infections, allergies or URI. Patient "s visual fields have not changed significantly in recent time.    PHYSICAL EXAM: BP 114/71   Pulse 94   Temp 97.8 F (36.6 C)   Resp 20   Wt 106 lb 0.7 oz (48.1 kg)  Thin slightly cachectic male in NAD. There is pain elicited on deep palpation of his thoracic spine. Slight motor weakness in his left lower extremity. No peripheral adenopathy identified. Well-developed well-nourished patient in NAD. HEENT reveals PERLA, EOMI, discs not visualized.  Oral cavity is clear. No oral mucosal lesions are identified. Neck is clear without evidence of cervical or supraclavicular adenopathy. Lungs are clear to A&P. Cardiac examination is essentially unremarkable with regular rate and rhythm without murmur rub or thrill. Abdomen is benign with no organomegaly or masses noted. Motor sensory and DTR levels are equal and symmetric in the upper and lower extremities. Cranial nerves II through XII are grossly intact. Proprioception is intact. No peripheral adenopathy or edema is identified. No motor or sensory levels are noted. Crude visual fields are within normal range.  LABORATORY DATA: CT-guided biopsy to be ordered    RADIOLOGY RESULTS: CT scan reviewed and compatible above-stated findings   IMPRESSION: Widespread metastatic disease from tumor as yet unknown primary in 70 year old male  PLAN: At this time like to start treatment planning for his thoracic lesions. I will target the area of T9-T11 and the large exophytic masses that are involved by CT criteria. I would plan on delivering 3000 cGy in 10 fractions. I discussed the case briefly with medical oncology who will be seeing the patient this afternoon and trying to obtain tissue confirmation. He also needs narcotic analgesics and that will be taken care of by medical oncology. I personally ordered CT simulation for tomorrow. Risks and benefits of radiation including possible dysphasia alteration  of blood counts skin reaction fatigue all were discussed in detail with the patient and his caregiver. Patient seems to comprehend my treatment plan well.  I would like to take this opportunity to thank you for allowing me to participate in the care of your patient.Armstead Peaks., MD

## 2016-03-16 NOTE — Progress Notes (Signed)
Bone pain per pt unbearable at times.

## 2016-03-17 ENCOUNTER — Ambulatory Visit
Admission: RE | Admit: 2016-03-17 | Discharge: 2016-03-17 | Disposition: A | Discharge status: Home / Self Care | Payer: Medicare Other | Source: Ambulatory Visit | Attending: Radiation Oncology | Admitting: Radiation Oncology

## 2016-03-17 ENCOUNTER — Ambulatory Visit: Payer: Medicaid Other | Admitting: Internal Medicine

## 2016-03-17 DIAGNOSIS — Z510 Encounter for antineoplastic radiation therapy: Secondary | ICD-10-CM | POA: Diagnosis not present

## 2016-03-17 LAB — KAPPA/LAMBDA LIGHT CHAINS
KAPPA FREE LGHT CHN: 1005.1 mg/L — AB (ref 3.3–19.4)
KAPPA, LAMDA LIGHT CHAIN RATIO: 141.56 — AB (ref 0.26–1.65)
Lambda free light chains: 7.1 mg/L (ref 5.7–26.3)

## 2016-03-17 LAB — HAPTOGLOBIN: Haptoglobin: 204 mg/dL — ABNORMAL HIGH (ref 34–200)

## 2016-03-17 LAB — BETA 2 MICROGLOBULIN, SERUM: BETA 2 MICROGLOBULIN: 2.5 mg/L — AB (ref 0.6–2.4)

## 2016-03-18 ENCOUNTER — Other Ambulatory Visit: Payer: Self-pay | Admitting: *Deleted

## 2016-03-18 ENCOUNTER — Other Ambulatory Visit: Payer: Self-pay | Admitting: Internal Medicine

## 2016-03-18 ENCOUNTER — Telehealth: Payer: Self-pay | Admitting: *Deleted

## 2016-03-18 DIAGNOSIS — M899 Disorder of bone, unspecified: Secondary | ICD-10-CM

## 2016-03-18 DIAGNOSIS — D510 Vitamin B12 deficiency anemia due to intrinsic factor deficiency: Secondary | ICD-10-CM

## 2016-03-18 DIAGNOSIS — C9002 Multiple myeloma in relapse: Secondary | ICD-10-CM

## 2016-03-18 MED ORDER — DEXAMETHASONE 4 MG PO TABS: ORAL_TABLET | ORAL | 1 refills | Status: DC

## 2016-03-18 MED ORDER — HYDROCODONE-ACETAMINOPHEN 5-325 MG PO TABS: 1.0000 | ORAL_TABLET | ORAL | 0 refills | Status: DC | PRN

## 2016-03-18 NOTE — Telephone Encounter (Signed)
Pt's son in law came to cancer center today to pick up instructions for pet scan. These instructions were explained to the patient's son in law. Teach back process performed.

## 2016-03-18 NOTE — Progress Notes (Signed)
START ON PATHWAY REGIMEN - Multiple Myeloma  [id number]: VRd (Bortezomib 1.3 mg/m2 Subcut D1, 4, 8, 11 + Lenalidomide 25 mg + Dexamethasone 20 mg) q21 Days x 4-6 Cycles Maximum Prior to Stem Cell Harvest   A cycle is every 21 days:     Bortezomib (Velcade(R)) 1.3 mg/m2 subcut twice weekly on days 1,4,8, and 11 Dose Mod: None     Lenalidomide (Revlimid(R)) 25 mg orally days 1-14 Dose Mod: None     Dexamethasone (Decadron(R)) 20 mg orally days 1, 2, 4, 5, 8, 9, 11, and 12 Dose Mod: None Additional Orders: Herpes zoster prophylaxis recommended. Antithrombotic therapy: aspirin 81-325 mg PO daily for patients at low risk; enoxaparin 40 mg subcut daily or warfarin to an INR of 2-3 for patients at higher risk.  **Always confirm dose/schedule in your pharmacy ordering system**    Patient Characteristics: Newly Diagnosed, Transplant Eligible, Standard Risk R-ISS Staging: II Disease Classification: Newly Diagnosed Is Patient Eligible for Transplant? Transplant Eligible Risk Status: Standard Risk  Intent of Therapy: Non-Curative / Palliative Intent, Discussed with Patient

## 2016-03-18 NOTE — Progress Notes (Signed)
msg given to [place] to contact pt's family member to set up these additional appointments.

## 2016-03-21 DIAGNOSIS — Z510 Encounter for antineoplastic radiation therapy: Secondary | ICD-10-CM | POA: Diagnosis not present

## 2016-03-21 LAB — MULTIPLE MYELOMA PANEL, SERUM
ALBUMIN SERPL ELPH-MCNC: 3.6 g/dL (ref 2.9–4.4)
ALPHA2 GLOB SERPL ELPH-MCNC: 0.9 g/dL (ref 0.4–1.0)
Albumin/Glob SerPl: 0.7 (ref 0.7–1.7)
Alpha 1: 0.3 g/dL (ref 0.0–0.4)
B-GLOBULIN SERPL ELPH-MCNC: 1 g/dL (ref 0.7–1.3)
Gamma Glob SerPl Elph-Mcnc: 3.6 g/dL — ABNORMAL HIGH (ref 0.4–1.8)
Globulin, Total: 5.8 g/dL — ABNORMAL HIGH (ref 2.2–3.9)
IGM, SERUM: 17 mg/dL (ref 15–143)
IgA: 22 mg/dL — ABNORMAL LOW (ref 61–437)
IgG (Immunoglobin G), Serum: 3817 mg/dL — ABNORMAL HIGH (ref 700–1600)
M Protein SerPl Elph-Mcnc: 3.1 g/dL — ABNORMAL HIGH
TOTAL PROTEIN ELP: 9.4 g/dL — AB (ref 6.0–8.5)

## 2016-03-22 ENCOUNTER — Inpatient Hospital Stay: Payer: Medicare Other

## 2016-03-22 ENCOUNTER — Telehealth: Payer: Self-pay | Admitting: *Deleted

## 2016-03-22 ENCOUNTER — Other Ambulatory Visit: Payer: Self-pay | Admitting: Radiology

## 2016-03-22 ENCOUNTER — Inpatient Hospital Stay (HOSPITAL_BASED_OUTPATIENT_CLINIC_OR_DEPARTMENT_OTHER): Payer: Medicare Other | Admitting: Internal Medicine

## 2016-03-22 ENCOUNTER — Other Ambulatory Visit: Payer: Self-pay | Admitting: *Deleted

## 2016-03-22 ENCOUNTER — Ambulatory Visit
Admission: RE | Admit: 2016-03-22 | Discharge: 2016-03-22 | Disposition: A | Discharge status: Home / Self Care | Payer: Medicare Other | Source: Ambulatory Visit | Attending: Internal Medicine | Admitting: Internal Medicine

## 2016-03-22 VITALS — BP 101/62 | HR 51 | Temp 98.3°F | Resp 16 | Ht 66.0 in | Wt 108.0 lb

## 2016-03-22 DIAGNOSIS — C9000 Multiple myeloma not having achieved remission: Secondary | ICD-10-CM

## 2016-03-22 DIAGNOSIS — M549 Dorsalgia, unspecified: Secondary | ICD-10-CM | POA: Diagnosis not present

## 2016-03-22 DIAGNOSIS — Z79899 Other long term (current) drug therapy: Secondary | ICD-10-CM

## 2016-03-22 DIAGNOSIS — C9002 Multiple myeloma in relapse: Secondary | ICD-10-CM

## 2016-03-22 DIAGNOSIS — M899 Disorder of bone, unspecified: Secondary | ICD-10-CM

## 2016-03-22 DIAGNOSIS — K5900 Constipation, unspecified: Secondary | ICD-10-CM

## 2016-03-22 DIAGNOSIS — I700 Atherosclerosis of aorta: Secondary | ICD-10-CM | POA: Diagnosis not present

## 2016-03-22 DIAGNOSIS — R509 Fever, unspecified: Secondary | ICD-10-CM

## 2016-03-22 DIAGNOSIS — K769 Liver disease, unspecified: Secondary | ICD-10-CM

## 2016-03-22 DIAGNOSIS — Z598 Other problems related to housing and economic circumstances: Secondary | ICD-10-CM

## 2016-03-22 DIAGNOSIS — D1800 Hemangioma unspecified site: Secondary | ICD-10-CM

## 2016-03-22 DIAGNOSIS — J439 Emphysema, unspecified: Secondary | ICD-10-CM

## 2016-03-22 DIAGNOSIS — R490 Dysphonia: Secondary | ICD-10-CM

## 2016-03-22 DIAGNOSIS — R140 Abdominal distension (gaseous): Secondary | ICD-10-CM

## 2016-03-22 DIAGNOSIS — R634 Abnormal weight loss: Secondary | ICD-10-CM

## 2016-03-22 DIAGNOSIS — R630 Anorexia: Secondary | ICD-10-CM

## 2016-03-22 DIAGNOSIS — J449 Chronic obstructive pulmonary disease, unspecified: Secondary | ICD-10-CM

## 2016-03-22 DIAGNOSIS — Z8613 Personal history of malaria: Secondary | ICD-10-CM

## 2016-03-22 DIAGNOSIS — D510 Vitamin B12 deficiency anemia due to intrinsic factor deficiency: Secondary | ICD-10-CM

## 2016-03-22 DIAGNOSIS — R0602 Shortness of breath: Secondary | ICD-10-CM

## 2016-03-22 DIAGNOSIS — I2510 Atherosclerotic heart disease of native coronary artery without angina pectoris: Secondary | ICD-10-CM

## 2016-03-22 DIAGNOSIS — R1310 Dysphagia, unspecified: Secondary | ICD-10-CM

## 2016-03-22 DIAGNOSIS — M8580 Other specified disorders of bone density and structure, unspecified site: Secondary | ICD-10-CM

## 2016-03-22 DIAGNOSIS — Z8611 Personal history of tuberculosis: Secondary | ICD-10-CM

## 2016-03-22 DIAGNOSIS — Z5111 Encounter for antineoplastic chemotherapy: Secondary | ICD-10-CM | POA: Diagnosis not present

## 2016-03-22 DIAGNOSIS — D649 Anemia, unspecified: Secondary | ICD-10-CM

## 2016-03-22 DIAGNOSIS — Z5989 Other problems related to housing and economic circumstances: Secondary | ICD-10-CM

## 2016-03-22 DIAGNOSIS — Z599 Problem related to housing and economic circumstances, unspecified: Secondary | ICD-10-CM

## 2016-03-22 DIAGNOSIS — M25551 Pain in right hip: Secondary | ICD-10-CM

## 2016-03-22 HISTORY — DX: Multiple myeloma not having achieved remission: C90.00

## 2016-03-22 LAB — CBC WITH DIFFERENTIAL/PLATELET
Basophils Absolute: 0 10*3/uL (ref 0–0.1)
Basophils Relative: 0 %
EOS ABS: 0 10*3/uL (ref 0–0.7)
EOS PCT: 0 %
HCT: 24 % — ABNORMAL LOW (ref 40.0–52.0)
Hemoglobin: 8 g/dL — ABNORMAL LOW (ref 13.0–18.0)
LYMPHS ABS: 0.7 10*3/uL — AB (ref 1.0–3.6)
LYMPHS PCT: 10 %
MCH: 29.5 pg (ref 26.0–34.0)
MCHC: 33.1 g/dL (ref 32.0–36.0)
MCV: 89 fL (ref 80.0–100.0)
MONO ABS: 0.3 10*3/uL (ref 0.2–1.0)
Monocytes Relative: 5 %
Neutro Abs: 5.4 10*3/uL (ref 1.4–6.5)
Neutrophils Relative %: 85 %
PLATELETS: 136 10*3/uL — AB (ref 150–440)
RBC: 2.69 MIL/uL — ABNORMAL LOW (ref 4.40–5.90)
RDW: 14.4 % (ref 11.5–14.5)
WBC: 6.4 10*3/uL (ref 3.8–10.6)

## 2016-03-22 LAB — COMPREHENSIVE METABOLIC PANEL
ALT: 38 U/L (ref 17–63)
ANION GAP: 5 (ref 5–15)
AST: 35 U/L (ref 15–41)
Albumin: 3.2 g/dL — ABNORMAL LOW (ref 3.5–5.0)
Alkaline Phosphatase: 87 U/L (ref 38–126)
BUN: 32 mg/dL — ABNORMAL HIGH (ref 6–20)
CHLORIDE: 99 mmol/L — AB (ref 101–111)
CO2: 25 mmol/L (ref 22–32)
Calcium: 9.2 mg/dL (ref 8.9–10.3)
Creatinine, Ser: 0.9 mg/dL (ref 0.61–1.24)
Glucose, Bld: 128 mg/dL — ABNORMAL HIGH (ref 65–99)
POTASSIUM: 4.5 mmol/L (ref 3.5–5.1)
SODIUM: 129 mmol/L — AB (ref 135–145)
Total Bilirubin: 0.7 mg/dL (ref 0.3–1.2)
Total Protein: 8.1 g/dL (ref 6.5–8.1)

## 2016-03-22 LAB — GLUCOSE, CAPILLARY: Glucose-Capillary: 105 mg/dL — ABNORMAL HIGH (ref 65–99)

## 2016-03-22 MED ORDER — DEXAMETHASONE 4 MG PO TABS: ORAL_TABLET | ORAL | 1 refills | Status: DC

## 2016-03-22 MED ORDER — FLUDEOXYGLUCOSE F - 18 (FDG) INJECTION
14.1890 | Freq: Once | INTRAVENOUS | Status: AC | PRN
  Administered 2016-03-22: 14.189 via INTRAVENOUS

## 2016-03-22 MED ORDER — SODIUM CHLORIDE 0.9 % IV SOLN
Freq: Once | INTRAVENOUS | Status: AC
  Administered 2016-03-22: 11:00:00 via INTRAVENOUS
  Filled 2016-03-22: qty 1000

## 2016-03-22 MED ORDER — ZOLEDRONIC ACID 4 MG/100ML IV SOLN
4.0000 mg | Freq: Once | INTRAVENOUS | Status: AC
  Administered 2016-03-22: 4 mg via INTRAVENOUS
  Filled 2016-03-22: qty 100

## 2016-03-22 MED ORDER — LENALIDOMIDE 20 MG PO CAPS: 20.0000 mg | ORAL_CAPSULE | Freq: Every day | ORAL | 4 refills | Status: DC

## 2016-03-22 NOTE — Progress Notes (Signed)
Patient here for f/u for lab results and NEW zometa treatment.  The patient c/o thoracic pain rates pain 5/10. Took 1 dose of norco 5/325 last evening. "i only want to take my pain medication at night because I get sleepy when I take the pain med." "My back pain moves to both of my sides on the left and right. I feel like it moves down from both shoulders and radiates down to my ribs on my side."  Pt c/o hoarseness of voice that has persisted for some time.  Pt whispers responses back to the nurse. Pt reports 4-5 bms a week. Does not use any laxatives. Pt has decreased appetite. Discussed and explored pt's dietary preferences with the patient. Pt is a vegetarian. He will intake milk products but in "general does not like milk."  Son present with patient in exam room. Per son- pt hesitant to start boost/ensure. "I'm afraid that I will not like it. I only eat lentils in my diet to get protein." "I could try to blend my lentils and do a veggie drink." "i also only like beets."  Per son- requested 1-2 samples to have pt try of boost/ensure.   Pt states he is willing to try the boost/ensure but he is not sure that he will like it.  Samples provided of boost plus for patient to try.  RN Discussed the role of nutritional supplements in length with patient and son.

## 2016-03-22 NOTE — Patient Instructions (Signed)
Lenalidomide Oral Capsules  What is this medicine?  LENALIDOMIDE (len a LID oh mide) is a chemotherapy drug that targets specific proteins within cancer cells and stops the cancer cell from growing. It is used to treat multiple myeloma, mantle cell lymphoma, and some myelodysplastic syndromes that cause severe anemia requiring blood transfusions.  This medicine may be used for other purposes; ask your health care provider or pharmacist if you have questions.  What should I tell my health care provider before I take this medicine?  They need to know if you have any of these conditions:  -blood clots in the legs or the lungs  -high blood pressure  -high cholesterol  -infection  -irregular monthly periods or menstrual cycles  -kidney disease  -liver disease  -smoke tobacco  -thyroid disease  -an unusual or allergic reaction to lenalidomide, other medicines, foods, dyes, or preservatives  -pregnant or trying to get pregnant  -breast-feeding  How should I use this medicine?  Take this medicine by mouth with a glass of water. Follow the directions on the prescription label. Do not cut, crush, or chew this medicine. Take your medicine at regular intervals. Do not take it more often than directed. Do not stop taking except on your doctor's advice.  A MedGuide will be given with each prescription and refill. Read this guide carefully each time. The MedGuide may change frequently.  Talk to your pediatrician regarding the use of this medicine in children. Special care may be needed.  Overdosage: If you think you have taken too much of this medicine contact a poison control center or emergency room at once.  NOTE: This medicine is only for you. Do not share this medicine with others.  What if I miss a dose?  If you miss a dose, take it as soon as you can. If your next dose is to be taken in less than 12 hours, then do not take the missed dose. Take the next dose at your regular time. Do not take double or extra doses.  What may  interact with this medicine?  This medicine may interact with the following medications:  -digoxin  -medicines that increase the risk of thrombosis like estrogens or erythropoietic agents (e.g., epoetin alfa and darbepoetin alfa)  -warfarin  This list may not describe all possible interactions. Give your health care provider a list of all the medicines, herbs, non-prescription drugs, or dietary supplements you use. Also tell them if you smoke, drink alcohol, or use illegal drugs. Some items may interact with your medicine.  What should I watch for while using this medicine?  Visit your doctor for regular check ups. Tell your doctor or healthcare professional if your symptoms do not start to get better or if they get worse. You will need to have important blood work done while you are taking this medicine.  This medicine is available only through a special program. Doctors, pharmacies, and patients must meet all of the conditions of the program. Your health care provider will help you get signed up with the program if you need this medicine. Through the program you will only receive up to a 28 day supply of the medicine at one time. You will need a new prescription for each refill.  This medicine can cause birth defects. Do not get pregnant while taking this drug. Females with child-bearing potential will need to have 2 negative pregnancy tests before starting this medicine. Pregnancy testing must be done every 2 to 4 weeks as   directed while taking this medicine. Use 2 reliable forms of birth control together while you are taking this medicine and for 1 month after you stop taking this medicine. If you think that you might be pregnant talk to your doctor right away.  Men must use a latex condom during sexual contact with a woman while taking this medicine and for 28 days after you stop taking this medicine. A latex condom is needed even if you have had a vasectomy. Contact your doctor right away if your partner  becomes pregnant. Do not donate sperm while taking this medicine and for 28 days after you stop taking this medicine.  Do not give blood while taking the medicine and for 1 month after completion of treatment to avoid exposing pregnant women to the medicine through the donated blood.  Talk to your doctor about your risk of cancer. You may be more at risk for certain types of cancers if you take this medicine.  What side effects may I notice from receiving this medicine?  Side effects that you should report to your doctor or health care professional as soon as possible:  -allergic reactions like skin rash, itching or hives, swelling of the face, lips, or tongue  -breathing problems  -chest pain or tightness  -fast, irregular heartbeat  -low blood counts - this medicine may decrease the number of white blood cells, red blood cells and platelets. You may be at increased risk for infections and bleeding.  -seizures  -signs and symptoms of bleeding such as bloody or black, tarry stools; red or dark-brown urine; spitting up blood or brown material that looks like coffee grounds; red spots on the skin; unusual bruising or bleeding from the eye, gums, or nose  -signs and symptoms of a blood clot such as breathing problems; changes in vision; chest pain; severe, sudden headache; pain, swelling, warmth in the leg; trouble speaking; sudden numbness or weakness of the face, arm or leg  -signs and symptoms of liver injury like dark yellow or brown urine; general ill feeling or flu-like symptoms; light-colored stools; loss of appetite; nausea; right upper belly pain; unusually weak or tired; yellowing of the eyes or skin  -signs and symptoms of a stroke like changes in vision; confusion; trouble speaking or understanding; severe headaches; sudden numbness or weakness of the face, arm or leg; trouble walking; dizziness; loss of balance or coordination  -sweating  -vomiting  Side effects that usually do not require medical  attention (report to your doctor or health care professional if they continue or are bothersome):  -constipation  -cough  -diarrhea  -tiredness  This list may not describe all possible side effects. Call your doctor for medical advice about side effects. You may report side effects to FDA at 1-800-FDA-1088.  Where should I keep my medicine?  Keep out of the reach of children.  Store at room temperature between 15 and 30 degrees C (59 and 86 degrees F). Throw away any unused medicine after the expiration date.  NOTE: This sheet is a summary. It may not cover all possible information. If you have questions about this medicine, talk to your doctor, pharmacist, or health care provider.      2016, Elsevier/Gold Standard. (2013-10-01 18:30:01)

## 2016-03-22 NOTE — Progress Notes (Signed)
[place] NOTE  Patient Care Team: Alfonse Flavors, MD as PCP - General (Family Medicine)  [name]; [facility]  CHIEF COMPLAINTS/PURPOSE OF CONSULTATION:  Multiple lytic lesions  #  Oncology History   # Multiple Myeloma lytic lesions- Thoracic spine/ vertebral compression fractures; IgG Kappa- 3.1gm/dl; K/L-140.   # Hoarseness of voice?  # Abnormal CXR;? 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  male  with multiple bone lesions- is here to review the results of his blood work that were ordered at last visit.  Patient continues to complain of significant pain in his back. He has been taking only one to 2 hydrocodone today. He has not noted significant benefit of his pain while on the steroids [DEXA 40 mg a day for 4 days]. Complains of bloating nausea    Continues to have hoarseness of voice. Poor by mouth intake. No constipation. He is taking MiraLAX.  ROS: A complete 10 point review of system is done which is negative except mentioned above in history of present illness  MEDICAL HISTORY:  Past Medical History:  Diagnosis Date  . Anemia   . Cancer (HCC)    Bone metastasis  . Hearing loss   . Liver lesion   . Malaria 2003  . Multiple myeloma ([place]) 03/22/2016   Per Dr. Rogue Bussing on his PET order.  . Tuberculosis     SURGICAL HISTORY: Past Surgical History:  Procedure Laterality Date  . 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 No smoking or alcohol.  Social History   Social History  . Marital status: Married    Spouse name: N/A  . Number of children: N/A  . Years of education: N/A   Occupational History  . Not on file.   Social History Main Topics  . Smoking status: Never Smoker  . Smokeless tobacco: Never Used  . Alcohol use No  . Drug use: No   . Sexual activity: Not on file   Other Topics Concern  . Not on file   Social History Narrative  . No narrative on file    FAMILY HISTORY: no history of cancers in the family. No family history on file.  ALLERGIES:  has No Known Allergies.  MEDICATIONS:  Current Outpatient Prescriptions  Medication Sig Dispense Refill  . HYDROcodone-acetaminophen (NORCO/VICODIN) 5-325 MG tablet Take 1 tablet by mouth every 4 (four) hours as needed for moderate pain. 60 tablet 0  . Vitamin D, Ergocalciferol, (DRISDOL) 50000 units CAPS capsule Take 50,000 Units by mouth every 7 (seven) days.    Marland Kitchen dexamethasone (DECADRON) 4 MG tablet 10 pills once a day with food once today 03/22/16 40 tablet 1  . ferrous sulfate 325 (65 FE) MG tablet Take 325 mg by mouth daily with breakfast.     No current facility-administered medications for this visit.       Marland Kitchen  PHYSICAL EXAMINATION: ECOG PERFORMANCE STATUS: 1 - Symptomatic but completely ambulatory  Vitals:   03/22/16 0915  BP: 101/62  Pulse: (!) 51  Resp: 16  Temp: 98.3 F (36.8 C)   Filed Weights   03/22/16 0915  Weight: 108 lb 0.4 oz (49 kg)    GENERAL: Thin built moderately nourished male patient; Alert, no distress and comfortable.   With family.  EYES: no pallor or icterus OROPHARYNX:  no thrush or ulceration; good dentition  NECK: supple, no masses felt LYMPH:  no palpable lymphadenopathy in the cervical, axillary or inguinal regions LUNGS: clear to auscultation and  No wheeze or crackles HEART/CVS: regular rate & rhythm and no murmurs; No lower extremity edema ABDOMEN: abdomen soft, non-tender and normal bowel sounds Musculoskeletal:no cyanosis of digits and no clubbing  PSYCH: alert & oriented x 3 with fluent speech NEURO: no focal motor/sensory deficits SKIN:  no rashes or significant lesions  LABORATORY DATA:  I have reviewed the data as listed Lab Results  Component Value Date   WBC 6.4 03/22/2016   HGB 8.0 (L) 03/22/2016    HCT 24.0 (L) 03/22/2016   MCV 89.0 03/22/2016   PLT 136 (L) 03/22/2016    Recent Labs  03/22/16 0859  NA 129*  K 4.5  CL 99*  CO2 25  GLUCOSE 128*  BUN 32*  CREATININE 0.90  CALCIUM 9.2  GFRNONAA >60  GFRAA >60  PROT 8.1  ALBUMIN 3.2*  AST 35  ALT 38  ALKPHOS 87  BILITOT 0.7    RADIOGRAPHIC STUDIES: I have personally reviewed the radiological images as listed and agreed with the findings in the report. Dg Chest 2 View  Result Date: 03/07/2016 CLINICAL DATA:  Shortness of breath for 1 month. History of tuberculosis. EXAM: CHEST  2 VIEW COMPARISON:  None. FINDINGS: There is hyperinflation of the lungs compatible with COPD. Biapical scarring. Heart is normal size. No effusions. Rounded masslike pleural-based density noted posteriorly in the mid chest on the lateral view, not well seen on the frontal view. IMPRESSION: Rounded masslike posterior pleural based density on the lateral view. Recommend chest CT for further evaluation. COPD/ chronic changes. Electronically Signed   By: Rolm Baptise M.D.   On: 03/07/2016 11:46   Ct Chest W Contrast  Result Date: 03/15/2016 CLINICAL DATA:  Abnormal chest x-ray.  Terrible back pain. EXAM: CT CHEST WITH CONTRAST TECHNIQUE: Multidetector CT imaging of the chest was performed during intravenous contrast administration. CONTRAST:  51m ISOVUE-300 IOPAMIDOL (ISOVUE-300) INJECTION 61% COMPARISON:  Two-view chest x-ray 03/07/2016 FINDINGS: Cardiovascular: The heart size is normal. No significant coronary artery calcifications are present. There is no significant pericardial effusion. Mediastinum/Nodes: No significant mediastinal or axillary adenopathy is present. Atherosclerotic calcifications are present at the aortic arch. Lungs/Pleura: Biapical pleural parenchymal scarring is present. There is upward traction on the hila bilaterally. Emphysematous changes are noted. No focal nodule, mass, or airspace disease is evident. Upper Abdomen: Multiple  hypodense lesions are present within the liver. The largest lesion is in segment 4 A measuring 14 mm. This may be a cyst. There are other more subtle hypodense lesions. Most worrisome is a far right lateral lesion in segment 7 on image 62 of series 2. This measures 15 mm. The visualized abdominal organs are otherwise unremarkable. Musculoskeletal: A large soft tissue lesion is centered along the posterior left ninth rib measuring 5.7 x 3.3 x 4.5 cm. There is no significant invasion of the spinal canal although the lesion does extend to the left T8-9 foramen. There is tumor invasion and the posterior elements of T9 on the left. An additional more lateral left T9 lytic lesion and pathologic fracture is noted. Focal irregularity of the posterior left T12 rib the likely represents a metastatic lesion as well. The pathologic fractures present at T10 with extraosseous tumor extending into the spinal canal at T10 and to the right. There is tumor in both feet T9-10 and T10-11 foramen on  the right. Marked osteopenia is present. There is lytic tumor involving the posterior elements on the right at T3 with probable involvement of the right T3-4 foramen. There is also a lytic lesion anteriorly within the T3 vertebral body without collapse. A far right T4 lesion measures 14 mm without collapse. In anterior lesion in T6 measures 14 mm. A 9 mm lesion is present within the right posterior element of T9. Infiltrative tumor is suspected posteriorly at T11 without collapse or extraosseous extension of tumor. Multiple other smaller spinous lesions are suspected. MRI of the thoracic spine without and with contrast would be useful for further evaluation. IMPRESSION: 1. Multiple lytic lesions throughout the thoracic spine and ribs as described suggesting metastatic disease. 2. The lesion evident on the chest x-ray corresponds with a 5.7 x 3.3 x 4.5 cm mass infiltrating the left T9 rib with extension to the left T8-9 foramen but no  invasion of the foramen. 3. 4.1 x 3.1 cm exophytic mass lesion at T10 with pathologic fracture. The tumor extends into the spinal canal with moderate central canal stenosis and right greater than left foraminal narrowing at T9-10 and T10-11. 4. Prominent infiltrative tumor posteriorly at T11 without compression fracture. 5. Additional pathologic fracture within the more distal left ninth rib. 6. Marked osteopenia with probable hemangioma is an high suspicion for multiple additional lesions throughout the thoracic spine. Recommend MRI of the thoracic spine without and with contrast for further evaluation 7. Multiple hypodense lesions within the liver also raise concern for metastatic disease. Dedicated CT of the abdomen and pelvis with contrast is recommended for further evaluation of metastatic disease and possible primary source. 8. Emphysema and biapical scarring in the lungs without evidence for primary tumor. These results were called by telephone at the time of interpretation on 03/15/2016 at 11:22 am to [facility], [place], who verbally acknowledged these results. Electronically Signed   By: San Morelle M.D.   On: 03/15/2016 11:44   Nm Pet Image Initial (pi) Whole Body  Result Date: 03/22/2016 CLINICAL DATA:  Subsequent treatment strategy for multiple myeloma EXAM: NUCLEAR MEDICINE PET WHOLE BODY TECHNIQUE: 14.2 mCi F-18 FDG was injected intravenously. CT data was obtained and used for attenuation correction and anatomic localization only. (This was not acquired as a diagnostic CT examination.) Additional exam technical data entered on technologist worksheet. FASTING BLOOD GLUCOSE:  Value: 105 mg/dl COMPARISON:  Chest CT 03/15/2016. FINDINGS: Comment: Per report from the technologist, the patient was in considerable pain during the examination. Despite efforts to maintain an acceptable level of comfort for the patient, the patient was unable to tolerate the entire examination. CT images were obtained,  but PET images were discontinued after acquisition through only the head. PET images through the neck, chest, abdomen, pelvis and lower extremities were not obtained. Accordingly, the examination will be interpreted as a noncontrast CT of the head, neck, chest, abdomen, pelvis, skeleton and extremities. Head/Neck: No definite intracranial mass. No hydrocephalus. No aggressive appearing osseous lesions are noted in the skull. No definite cervical lymphadenopathy noted on the noncontrast CT images. Chest: Heart size is normal. There is no significant pericardial fluid, thickening or pericardial calcification. Aortic atherosclerosis, without definite aneurysm. No definite lymphadenopathy noted in the mediastinal or hilar nodal stations. Please note that accurate exclusion of hilar adenopathy is limited on noncontrast CT scans. Esophagus is unremarkable in appearance. No axillary lymphadenopathy. Mass effect upon the left lower lobe (discussed below) related to expansile left ninth rib lesion. No suspicious appearing pulmonary  nodules or masses. Mild bilateral apical nodular pleuroparenchymal thickening, most compatible with chronic post infectious or inflammatory scarring. No acute consolidative airspace disease. No pleural effusions. Abdomen/Pelvis: Several low-attenuation lesions are again noted in the liver, largest of which is in the central aspect of segment 8 measuring 1.4 x 0.9 cm. These are incompletely characterized on today's noncontrast CT examination, but are favored to represent tiny cysts. Gallbladder is poorly visualized and may be decompressed or surgically absent (no surgical clips are noted in the gallbladder fossa). Unenhanced appearance of the pancreas, spleen, bilateral adrenal glands and bilateral kidneys is unremarkable. No hydroureteronephrosis. Unenhanced appearance of the urinary bladder is normal. No pathologic dilatation of small bowel or colon. Skeleton/Extremities: Again noted are  expansile lytic lesions involving the left ninth rib and T10 vertebral body where there is a previously recognized pathologic compression fracture. The lesion of the left ninth rib currently measures approximately 3.2 x 6.1 cm (image 130 of series 3) which is similar to the recent chest CT. Lesion in the right side of the T10 vertebral body extends into the adjacent paraspinal soft tissues and currently measures 3.4 x 3.5 cm, also similar to the prior examination. Large lytic lesion in the anterior aspect of the L4 vertebral body measuring 3.2 x 3.1 cm. Large lytic lesion in the right side of the sacrum just medial to the sacroiliac joint measuring 1.9 x 2.3 cm. Lytic lesion in the right ilium measuring 2.9 x 1.4 cm. Several other smaller lytic lesions are also noted. IMPRESSION: 1. Widespread lytic lesions throughout the visualized axial and appendicular skeleton, compatible with the reported clinical history of multiple myeloma. This is most notable for the lytic lesion with pathologic compression fracture in the T10 vertebral body. No definite extra-skeletal involvement noted on today's examination, which was limited by lack of acquisition of diagnostic PET images secondary to patient pain during the examination. 2. Aortic atherosclerosis. 3. Additional incidental findings, as above. Electronically Signed   By: Vinnie Langton M.D.   On: 03/22/2016 14:41   Dg Bone Survey Met  Result Date: 03/16/2016 CLINICAL DATA:  Multiple myeloma EXAM: METASTATIC BONE SURVEY COMPARISON:  03/15/2016 FINDINGS: Lateral view of the skull shows no lytic or blastic lesion. Lateral view of the cervical spine shows diffuse osteopenia. No lytic or blastic lesions. Mild disc space flattening at C3-C4-C4-C5 and C5-C6 level. Frontal view bilateral shoulder shows no lytic or blastic lesion. Frontal view of the neck shows no lytic or blastic lesion. Bilateral humerus demonstrates no lytic or blastic lesion. Bilateral forearm shows no  lytic or blastic lesions. There is dextroscoliosis lower thoracic spine. Mild levoscoliosis lumbar spine. Significant compression fracture of T10 vertebral body suspicious for pathologic fracture. Expansile lesion of the left eighth rib suspicious for metastatic disease. Frontal view of the pelvis shows no lytic or blastic lesions. Frontal view bilateral hip and proximal femur is unremarkable. Frontal view bilateral femur shows no lytic or blastic lesions. Bilateral tibia and fibula shows no lytic or blastic lesions. IMPRESSION: 1. There is probable pathologic fracture of T10 vertebral body suspicious for bony lesion. Expansile lesion of left posterior eighth rib suspicious for metastatic disease. Please see patient's CT scan of the chest from yesterday. Electronically Signed   By: Lahoma Crocker M.D.   On: 03/16/2016 17:00   Dg Hip Unilat With Pelvis 2-3 Views Right  Result Date: 03/07/2016 CLINICAL DATA:  Right hip pain.  No known injury. EXAM: DG HIP (WITH OR WITHOUT PELVIS) 2-3V RIGHT COMPARISON:  None.  FINDINGS: Early degenerative changes in the hips bilaterally with early spurring. Joint space is maintained. No acute bony abnormality. Specifically, no fracture, subluxation, or dislocation. Soft tissues are intact. SI joints are symmetric and unremarkable. IMPRESSION: No acute bony abnormality. Electronically Signed   By: Rolm Baptise M.D.   On: 03/07/2016 11:46    ASSESSMENT & PLAN:   Multiple myeloma in relapse (HCC) Multiple myeloma M protein- 3.1 g/dL; kappa/lambda ratio 140. Skeletal survey multiple bone lesions. PET scan pending  # Patient awaiting a bone marrow biopsy this Friday. Discussed the importance; cytogenetics etc.  # Recommend RVD- triplet therapy. Discussed the schedule; potential side effects including but not limited to neuropathy risk of shingles etc. also discussed the teratogenic side effects of Revlimid. Discussed the role of bone marrow transplant/autologous. Discussed that  myeloma is incurable however patient usually have life expectancy in years.  # Pain control-increase the patient to take hydrocodone more frequently; every 6-8 hours. Recommend increased fluid intake. Patient starting radiation this week for pain control.  # Anemia hemoglobin 8; hold of blood transfusion at this time repeat the labs next week if low recommend PRBC transfusion.  # Plan start Zometa today; recommend calcium and vitamin D; also discussed osteonecrosis of the jaw hypocalcemia.  # Plan follow-up next week 20th/wed/[place]- start Velcade day 1 and day 4 weekly; weekly dex; revlimid when available.   # 40 minutes face-to-face with the patient discussing the above plan of care; more than 50% of time spent on prognosis/ natural history; counseling and coordination.  All questions were answered. The patient knows to call the clinic with any problems, questions or concerns.    Cammie Sickle, MD 03/22/2016 5:12 PM

## 2016-03-22 NOTE — Progress Notes (Signed)
Patient could not recall security security #. Unable to enroll patient in REMS program at this time until this information is know. Pt survey unable to be performed until patient is enrolled in REMS.   Son will contact RN back this afternoon. Ph number of nursing station provided to pt's son.   Patient will be enrolled electronically with REMS program and patient's consent paper will be printed for patient's signature. Pt will return to clinic tomorrow at 4pm to further discuss teaching for Revlimid as time if limited for today's teaching due to sch. pet scan this afternoon. Patient provided with verbal and written information on the REMS program and written information on the Revlimid.  Dr. Rogue Bussing will prescribe 25 mg daily x 21 days.   He verbalizes understanding of the purpose of REMS program and the safety component of handling the drug safely and precautions. He understands the importance of the patient survey.  He gave verbal understanding not to share his drug with anyone and use protection during intercourse. He understands that the drug could potentially cause birth defects.   Patient's son requesting patient to apply for medicare. He would like to meet with social worker to help with this process and understand this process.  Referral to [facility] will be initiated per pt request.

## 2016-03-22 NOTE — Assessment & Plan Note (Addendum)
Multiple myeloma M protein- 3.1 g/dL; kappa/lambda ratio 140. Skeletal survey multiple bone lesions. PET scan pending  # Patient awaiting a bone marrow biopsy this Friday. Discussed the importance; cytogenetics etc.  # Recommend RVD- triplet therapy. Discussed the schedule; potential side effects including but not limited to neuropathy risk of shingles etc. also discussed the teratogenic side effects of Revlimid. Discussed the role of bone marrow transplant/autologous. Discussed that myeloma is incurable however patient usually have life expectancy in years.  # Pain control-increase the patient to take hydrocodone more frequently; every 6-8 hours. Recommend increased fluid intake. Patient starting radiation this week for pain control.  # Anemia hemoglobin 8; hold of blood transfusion at this time repeat the labs next week if low recommend PRBC transfusion.  # Plan start Zometa today; recommend calcium and vitamin D; also discussed osteonecrosis of the jaw hypocalcemia.  # Plan follow-up next week 20th/wed/[place]- start Velcade day 1 and day 4 weekly; weekly dex; revlimid when available.   # 40 minutes face-to-face with the patient discussing the above plan of care; more than 50% of time spent on prognosis/ natural history; counseling and coordination.

## 2016-03-22 NOTE — Telephone Encounter (Signed)
Patient unable to get pet performed today.  Caregiver requesting this apt be at 7am tomorrow or on Thursday. MD spoke with RN. Ok to even delay pet scan until next Monday if needed. Priority is bone marrow bx on Friday as scheduled.  Encouraged pt to take narcotics before going to pet scan to avoid pain when lying flat.

## 2016-03-22 NOTE — Progress Notes (Signed)
RN present with MD tx plan discussion.   md discussed the role of velcade-discussed the plan to potentially start on velcade next week in [place].  MD discussed the rems program purpose- use of revlimid, side effects, drug dispensing precautions and drug handling, pt instructed not to have unprotected sex while taking revlimid or to have sex with anyone that could be pregnant. Pt instructed on potential risk for birth defects with revlmid. Pt instructed not to share his revlimid with anyone.  Pt understands that the radiation is only for pain control.  He will re-start his steroids after his pet scan today.  Plan bone marrow biopsy this Friday. Pt understand that he should bring a driver and be NPO for this procedure.  Purpose and procedure of bone marrow discussed with patient.  Pt educated to drink 2-3 Liters of fluid daily. MD encouraged pt to drink Gatorade.  Pt given samples of boost/ensure. MD encouraged pt to try boost/ensure for adequate nutrition

## 2016-03-22 NOTE — Telephone Encounter (Signed)
Patient contacted RN back to provide s.S. Number: [phone].   Return phone call number: [phone].  Patient wanted to know if MD was going to send another rx for steroids to [facility].  RN spoke with md- md would like rx sent Decadron 4 mg - 10 pills once a day with food once today 03/22/16.  This is a 1 time dose only.  MD will write new orders at next visit next office visit.  I contacted [facility]. Spoke with pharmacist.  Per Pharmacy only has 1 mg dosages. This means pt will need to take 40 tablets today if 1 mg dosages are only available.  V/o to have 1 mg dosage if 4 mg not available.

## 2016-03-23 ENCOUNTER — Ambulatory Visit
Admission: RE | Admit: 2016-03-23 | Discharge: 2016-03-23 | Disposition: A | Discharge status: Home / Self Care | Payer: Medicare Other | Source: Ambulatory Visit | Attending: Radiation Oncology | Admitting: Radiation Oncology

## 2016-03-23 ENCOUNTER — Other Ambulatory Visit: Payer: Self-pay | Admitting: *Deleted

## 2016-03-23 DIAGNOSIS — Z510 Encounter for antineoplastic radiation therapy: Secondary | ICD-10-CM | POA: Diagnosis not present

## 2016-03-23 DIAGNOSIS — C9002 Multiple myeloma in relapse: Secondary | ICD-10-CM

## 2016-03-23 DIAGNOSIS — M899 Disorder of bone, unspecified: Secondary | ICD-10-CM

## 2016-03-23 MED ORDER — LENALIDOMIDE 20 MG PO CAPS: 20.0000 mg | ORAL_CAPSULE | Freq: Every day | ORAL | 4 refills | Status: DC

## 2016-03-23 NOTE — Patient Instructions (Signed)
Bortezomib injection What is this medicine? BORTEZOMIB (bor TEZ oh mib) is a medicine that targets proteins in cancer cells and stops the cancer cells from growing. It is used to treat multiple myeloma and mantle-cell lymphoma. This medicine may be used for other purposes; ask your health care provider or pharmacist if you have questions. What should I tell my health care provider before I take this medicine? They need to know if you have any of these conditions: -diabetes -heart disease -irregular heartbeat -liver disease -on hemodialysis -low blood counts, like low white blood cells, platelets, or hemoglobin -peripheral neuropathy -taking medicine for blood pressure -an unusual or allergic reaction to bortezomib, mannitol, boron, other medicines, foods, dyes, or preservatives -pregnant or trying to get pregnant -breast-feeding How should I use this medicine? This medicine is for injection into a vein or for injection under the skin. It is given by a health care professional in a hospital or clinic setting. Talk to your pediatrician regarding the use of this medicine in children. Special care may be needed. Overdosage: If you think you have taken too much of this medicine contact a poison control center or emergency room at once. NOTE: This medicine is only for you. Do not share this medicine with others. What if I miss a dose? It is important not to miss your dose. Call your doctor or health care professional if you are unable to keep an appointment. What may interact with this medicine? This medicine may interact with the following medications: -ketoconazole -rifampin -ritonavir -St. John's Wort This list may not describe all possible interactions. Give your health care provider a list of all the medicines, herbs, non-prescription drugs, or dietary supplements you use. Also tell them if you smoke, drink alcohol, or use illegal drugs. Some items may interact with your medicine. What  should I watch for while using this medicine? Visit your doctor for checks on your progress. This drug may make you feel generally unwell. This is not uncommon, as chemotherapy can affect healthy cells as well as cancer cells. Report any side effects. Continue your course of treatment even though you feel ill unless your doctor tells you to stop. You may get drowsy or dizzy. Do not drive, use machinery, or do anything that needs mental alertness until you know how this medicine affects you. Do not stand or sit up quickly, especially if you are an older patient. This reduces the risk of dizzy or fainting spells. In some cases, you may be given additional medicines to help with side effects. Follow all directions for their use. Call your doctor or health care professional for advice if you get a fever, chills or sore throat, or other symptoms of a cold or flu. Do not treat yourself. This drug decreases your body's ability to fight infections. Try to avoid being around people who are sick. This medicine may increase your risk to bruise or bleed. Call your doctor or health care professional if you notice any unusual bleeding. You may need blood work done while you are taking this medicine. In some patients, this medicine may cause a serious brain infection that may cause death. If you have any problems seeing, thinking, speaking, walking, or standing, tell your doctor right away. If you cannot reach your doctor, urgently seek other source of medical care. Do not become pregnant while taking this medicine. Women should inform their doctor if they wish to become pregnant or think they might be pregnant. There is a potential for serious  side effects to an unborn child. Talk to your health care professional or pharmacist for more information. Do not breast-feed an infant while taking this medicine. Check with your doctor or health care professional if you get an attack of severe diarrhea, nausea and vomiting, or if  you sweat a lot. The loss of too much body fluid can make it dangerous for you to take this medicine. What side effects may I notice from receiving this medicine? Side effects that you should report to your doctor or health care professional as soon as possible: -allergic reactions like skin rash, itching or hives, swelling of the face, lips, or tongue -breathing problems -changes in hearing -changes in vision -fast, irregular heartbeat -feeling faint or lightheaded, falls -pain, tingling, numbness in the hands or feet -right upper belly pain -seizures -swelling of the ankles, feet, hands -unusual bleeding or bruising -unusually weak or tired -vomiting -yellowing of the eyes or skin Side effects that usually do not require medical attention (report to your doctor or health care professional if they continue or are bothersome): -changes in emotions or moods -constipation -diarrhea -loss of appetite -headache -irritation at site where injected -nausea This list may not describe all possible side effects. Call your doctor for medical advice about side effects. You may report side effects to FDA at 1-800-FDA-1088. Where should I keep my medicine? This drug is given in a hospital or clinic and will not be stored at home. NOTE: This sheet is a summary. It may not cover all possible information. If you have questions about this medicine, talk to your doctor, pharmacist, or health care provider.    2016, Elsevier/Gold Standard. (2014-08-26 14:47:04)

## 2016-03-24 ENCOUNTER — Other Ambulatory Visit: Payer: Self-pay | Admitting: *Deleted

## 2016-03-24 ENCOUNTER — Inpatient Hospital Stay: Payer: Medicare Other

## 2016-03-24 ENCOUNTER — Ambulatory Visit
Admission: RE | Admit: 2016-03-24 | Discharge: 2016-03-24 | Disposition: A | Discharge status: Home / Self Care | Payer: Medicare Other | Source: Ambulatory Visit | Attending: Radiation Oncology | Admitting: Radiation Oncology

## 2016-03-24 DIAGNOSIS — Z510 Encounter for antineoplastic radiation therapy: Secondary | ICD-10-CM | POA: Diagnosis not present

## 2016-03-24 DIAGNOSIS — T451X5A Adverse effect of antineoplastic and immunosuppressive drugs, initial encounter: Principal | ICD-10-CM

## 2016-03-24 DIAGNOSIS — R112 Nausea with vomiting, unspecified: Secondary | ICD-10-CM

## 2016-03-24 MED ORDER — ONDANSETRON HCL 8 MG PO TABS: 8.0000 mg | ORAL_TABLET | Freq: Three times a day (TID) | ORAL | 3 refills | Status: DC | PRN

## 2016-03-24 MED ORDER — PROCHLORPERAZINE MALEATE 10 MG PO TABS: 10.0000 mg | ORAL_TABLET | Freq: Four times a day (QID) | ORAL | 3 refills | Status: DC | PRN

## 2016-03-25 ENCOUNTER — Ambulatory Visit
Admission: RE | Admit: 2016-03-25 | Discharge: 2016-03-25 | Disposition: A | Discharge status: Home / Self Care | Payer: Medicare Other | Source: Ambulatory Visit | Attending: Internal Medicine | Admitting: Internal Medicine

## 2016-03-25 ENCOUNTER — Ambulatory Visit
Admission: RE | Admit: 2016-03-25 | Discharge: 2016-03-25 | Disposition: A | Discharge status: Home / Self Care | Payer: Medicare Other | Source: Ambulatory Visit | Attending: Radiation Oncology | Admitting: Radiation Oncology

## 2016-03-25 ENCOUNTER — Encounter: Payer: Self-pay | Admitting: Internal Medicine

## 2016-03-25 ENCOUNTER — Telehealth: Payer: Self-pay | Admitting: *Deleted

## 2016-03-25 DIAGNOSIS — M899 Disorder of bone, unspecified: Secondary | ICD-10-CM | POA: Insufficient documentation

## 2016-03-25 DIAGNOSIS — D649 Anemia, unspecified: Secondary | ICD-10-CM | POA: Diagnosis not present

## 2016-03-25 DIAGNOSIS — Z510 Encounter for antineoplastic radiation therapy: Secondary | ICD-10-CM | POA: Diagnosis not present

## 2016-03-25 HISTORY — DX: Constipation, unspecified: K59.00

## 2016-03-25 HISTORY — DX: Dysphonia: R49.0

## 2016-03-25 LAB — CBC WITH DIFFERENTIAL/PLATELET
BASOS ABS: 0 10*3/uL (ref 0–0.1)
BASOS PCT: 0 %
EOS ABS: 0.1 10*3/uL (ref 0–0.7)
EOS PCT: 1 %
HEMATOCRIT: 24.2 % — AB (ref 40.0–52.0)
Hemoglobin: 8.4 g/dL — ABNORMAL LOW (ref 13.0–18.0)
Lymphocytes Relative: 14 %
Lymphs Abs: 0.9 10*3/uL — ABNORMAL LOW (ref 1.0–3.6)
MCH: 30.6 pg (ref 26.0–34.0)
MCHC: 34.7 g/dL (ref 32.0–36.0)
MCV: 88.3 fL (ref 80.0–100.0)
MONO ABS: 0.2 10*3/uL (ref 0.2–1.0)
MONOS PCT: 3 %
Neutro Abs: 5.2 10*3/uL (ref 1.4–6.5)
Neutrophils Relative %: 82 %
PLATELETS: 153 10*3/uL (ref 150–440)
RBC: 2.74 MIL/uL — ABNORMAL LOW (ref 4.40–5.90)
RDW: 14.4 % (ref 11.5–14.5)
WBC: 6.3 10*3/uL (ref 3.8–10.6)

## 2016-03-25 LAB — PROTIME-INR
INR: 1.32
PROTHROMBIN TIME: 16.5 s — AB (ref 11.4–15.2)

## 2016-03-25 LAB — APTT: APTT: 27 s (ref 24–36)

## 2016-03-25 MED ORDER — SODIUM CHLORIDE 0.9 % IV SOLN: INTRAVENOUS | Status: DC

## 2016-03-25 MED ORDER — MIDAZOLAM HCL 2 MG/2ML IJ SOLN
INTRAMUSCULAR | Status: AC | PRN
  Administered 2016-03-25: 1 mg via INTRAVENOUS

## 2016-03-25 MED ORDER — FENTANYL CITRATE (PF) 100 MCG/2ML IJ SOLN
INTRAMUSCULAR | Status: AC | PRN
  Administered 2016-03-25: 50 ug via INTRAVENOUS

## 2016-03-25 MED ORDER — HYDROCODONE-ACETAMINOPHEN 5-325 MG PO TABS: 1.0000 | ORAL_TABLET | ORAL | Status: DC | PRN

## 2016-03-25 NOTE — Discharge Instructions (Signed)
Bone Marrow Aspiration and Bone Marrow Biopsy, Care After °Refer to this sheet in the next few weeks. These instructions provide you with information about caring for yourself after your procedure. Your health care provider may also give you more specific instructions. Your treatment has been planned according to current medical practices, but problems sometimes occur. Call your health care provider if you have any problems or questions after your procedure. °WHAT TO EXPECT AFTER THE PROCEDURE °After your procedure, it is common to have: °· Soreness or tenderness around the puncture site. °· Bruising. °HOME CARE INSTRUCTIONS °· Take medicines only as directed by your health care provider. °· Follow your health care provider's instructions about: °¨ Puncture site care. °¨ Bandage (dressing) changes and removal. °· Bathe and shower as directed by your health care provider. °· Check your puncture site every day for signs of infection. Watch for: °¨ Redness, swelling, or pain. °¨ Fluid, blood, or pus. °· Return to your normal activities as directed by your health care provider. °· Keep all follow-up visits as directed by your health care provider. This is important. °SEEK MEDICAL CARE IF: °· You have a fever. °· You have uncontrollable bleeding. °· You have redness, swelling, or pain at the site of your puncture. °· You have fluid, blood, or pus coming from your puncture site. °  °This information is not intended to replace advice given to you by your health care provider. Make sure you discuss any questions you have with your health care provider. °  °Document Released: 01/14/2005 Document Revised: 11/11/2014 Document Reviewed: 06/18/2014 °Elsevier Interactive Patient Education ©2016 Elsevier Inc. ° °

## 2016-03-25 NOTE — CV Procedure (Signed)
Under CT guidance, bone marrow aspiration and core biopsy was performed of right iliac bone. No immediate complications.

## 2016-03-25 NOTE — Progress Notes (Unsigned)
Oncology nurse informed PSN that patient was wanting to know if he qualified for Medicare benefits.  PSN sent patient information, via mail, about Medicare qualifications and advised him to speak with a representative from the local Social Security office about the matter.

## 2016-03-25 NOTE — CV Procedure (Signed)
Procedure and risks discussed with patient and family. Informed consent obtained. Will perform CT-guided bone marrow biopsy.

## 2016-03-25 NOTE — Telephone Encounter (Signed)
Informed that rx was in with the letter he picked up today and he needs to take the to the pharmacy

## 2016-03-25 NOTE — Progress Notes (Signed)
Pt resting at this time, may be discharged at 1100 per orders. bandade dressing dry and intact. Daughter(caregiver) with patient with discharge instructions given, questions answered.

## 2016-03-25 NOTE — Telephone Encounter (Signed)
States prescription was to be sent in today and the pharmacy states they do not have one form [place] for him

## 2016-03-28 ENCOUNTER — Inpatient Hospital Stay: Payer: Medicare Other

## 2016-03-28 ENCOUNTER — Inpatient Hospital Stay (HOSPITAL_BASED_OUTPATIENT_CLINIC_OR_DEPARTMENT_OTHER): Payer: Medicare Other | Admitting: Internal Medicine

## 2016-03-28 ENCOUNTER — Ambulatory Visit
Admission: RE | Admit: 2016-03-28 | Discharge: 2016-03-28 | Disposition: A | Discharge status: Home / Self Care | Payer: Medicare Other | Source: Ambulatory Visit | Attending: Radiation Oncology | Admitting: Radiation Oncology

## 2016-03-28 ENCOUNTER — Other Ambulatory Visit: Payer: Self-pay

## 2016-03-28 DIAGNOSIS — R634 Abnormal weight loss: Secondary | ICD-10-CM

## 2016-03-28 DIAGNOSIS — C9002 Multiple myeloma in relapse: Secondary | ICD-10-CM

## 2016-03-28 DIAGNOSIS — R140 Abdominal distension (gaseous): Secondary | ICD-10-CM

## 2016-03-28 DIAGNOSIS — M899 Disorder of bone, unspecified: Secondary | ICD-10-CM | POA: Diagnosis not present

## 2016-03-28 DIAGNOSIS — D649 Anemia, unspecified: Secondary | ICD-10-CM | POA: Diagnosis not present

## 2016-03-28 DIAGNOSIS — K769 Liver disease, unspecified: Secondary | ICD-10-CM

## 2016-03-28 DIAGNOSIS — M8580 Other specified disorders of bone density and structure, unspecified site: Secondary | ICD-10-CM

## 2016-03-28 DIAGNOSIS — Z5111 Encounter for antineoplastic chemotherapy: Secondary | ICD-10-CM | POA: Diagnosis not present

## 2016-03-28 DIAGNOSIS — Z79899 Other long term (current) drug therapy: Secondary | ICD-10-CM

## 2016-03-28 DIAGNOSIS — I2510 Atherosclerotic heart disease of native coronary artery without angina pectoris: Secondary | ICD-10-CM

## 2016-03-28 DIAGNOSIS — J449 Chronic obstructive pulmonary disease, unspecified: Secondary | ICD-10-CM

## 2016-03-28 DIAGNOSIS — Z8613 Personal history of malaria: Secondary | ICD-10-CM

## 2016-03-28 DIAGNOSIS — Z510 Encounter for antineoplastic radiation therapy: Secondary | ICD-10-CM | POA: Diagnosis not present

## 2016-03-28 DIAGNOSIS — R1310 Dysphagia, unspecified: Secondary | ICD-10-CM

## 2016-03-28 DIAGNOSIS — C9000 Multiple myeloma not having achieved remission: Secondary | ICD-10-CM | POA: Diagnosis not present

## 2016-03-28 DIAGNOSIS — R509 Fever, unspecified: Secondary | ICD-10-CM

## 2016-03-28 DIAGNOSIS — D1800 Hemangioma unspecified site: Secondary | ICD-10-CM

## 2016-03-28 DIAGNOSIS — M549 Dorsalgia, unspecified: Secondary | ICD-10-CM

## 2016-03-28 DIAGNOSIS — K5900 Constipation, unspecified: Secondary | ICD-10-CM

## 2016-03-28 DIAGNOSIS — R490 Dysphonia: Secondary | ICD-10-CM

## 2016-03-28 DIAGNOSIS — Z8611 Personal history of tuberculosis: Secondary | ICD-10-CM

## 2016-03-28 DIAGNOSIS — R630 Anorexia: Secondary | ICD-10-CM

## 2016-03-28 DIAGNOSIS — R0602 Shortness of breath: Secondary | ICD-10-CM

## 2016-03-28 DIAGNOSIS — J439 Emphysema, unspecified: Secondary | ICD-10-CM

## 2016-03-28 DIAGNOSIS — M25551 Pain in right hip: Secondary | ICD-10-CM

## 2016-03-28 LAB — BASIC METABOLIC PANEL
ANION GAP: 6 (ref 5–15)
BUN: 13 mg/dL (ref 6–20)
CHLORIDE: 101 mmol/L (ref 101–111)
CO2: 22 mmol/L (ref 22–32)
Calcium: 7.9 mg/dL — ABNORMAL LOW (ref 8.9–10.3)
Creatinine, Ser: 0.86 mg/dL (ref 0.61–1.24)
GFR calc Af Amer: >60 mL/min (ref >60)
GFR calc non Af Amer: >60 mL/min (ref >60)
GLUCOSE: 115 mg/dL — AB (ref 65–99)
POTASSIUM: 5.7 mmol/L — AB (ref 3.5–5.1)
Sodium: 129 mmol/L — ABNORMAL LOW (ref 135–145)

## 2016-03-28 LAB — CBC WITH DIFFERENTIAL/PLATELET
Basophils Absolute: 0 10*3/uL (ref 0–0.1)
Basophils Relative: 0 %
EOS PCT: 2 %
Eosinophils Absolute: 0.1 10*3/uL (ref 0–0.7)
HCT: 24.8 % — ABNORMAL LOW (ref 40.0–52.0)
Hemoglobin: 8.6 g/dL — ABNORMAL LOW (ref 13.0–18.0)
LYMPHS ABS: 0.9 10*3/uL — AB (ref 1.0–3.6)
LYMPHS PCT: 16 %
MCH: 30.5 pg (ref 26.0–34.0)
MCHC: 34.7 g/dL (ref 32.0–36.0)
MCV: 87.9 fL (ref 80.0–100.0)
MONO ABS: 0.2 10*3/uL (ref 0.2–1.0)
Monocytes Relative: 4 %
Neutro Abs: 4.2 10*3/uL (ref 1.4–6.5)
Neutrophils Relative %: 78 %
Platelets: 161 10*3/uL (ref 150–440)
RBC: 2.83 MIL/uL — ABNORMAL LOW (ref 4.40–5.90)
RDW: 14.8 % — AB (ref 11.5–14.5)
WBC: 5.4 10*3/uL (ref 3.8–10.6)

## 2016-03-28 LAB — SAMPLE TO BLOOD BANK

## 2016-03-28 MED ORDER — BORTEZOMIB CHEMO SQ INJECTION 3.5 MG (2.5MG/ML)
1.3000 mg/m2 | Freq: Once | INTRAMUSCULAR | Status: AC
  Administered 2016-03-28: 2 mg via SUBCUTANEOUS
  Filled 2016-03-28 (×2): qty 0.8

## 2016-03-28 MED ORDER — PROCHLORPERAZINE MALEATE 10 MG PO TABS
10.0000 mg | ORAL_TABLET | Freq: Once | ORAL | Status: AC
  Administered 2016-03-28: 10 mg via ORAL
  Filled 2016-03-28: qty 1

## 2016-03-28 MED ORDER — DEXAMETHASONE 4 MG PO TABS
40.0000 mg | ORAL_TABLET | ORAL | Status: DC
  Administered 2016-03-28: 40 mg via ORAL

## 2016-03-28 MED ORDER — BORTEZOMIB CHEMO IV INJECTION 3.5 MG
1.3000 mg/m2 | Freq: Once | INTRAMUSCULAR | Status: DC
  Filled 2016-03-28: qty 2

## 2016-03-28 MED ORDER — SODIUM CHLORIDE 0.9 % IV SOLN
Freq: Once | INTRAVENOUS | Status: AC
  Administered 2016-03-28: 11:00:00 via INTRAVENOUS
  Filled 2016-03-28: qty 1000

## 2016-03-28 MED ORDER — ACYCLOVIR 400 MG PO TABS: 400.0000 mg | ORAL_TABLET | Freq: Two times a day (BID) | ORAL | 2 refills | Status: DC

## 2016-03-28 NOTE — Progress Notes (Signed)
Pt c/o consitpation- using otc milk of mag. Stopped using miralax.

## 2016-03-28 NOTE — Assessment & Plan Note (Signed)
Multiple myeloma M protein- 3.1 g/dL; kappa/lambda ratio 140. Skeletal survey multiple bone lesions. PET scan pending; status post bone marrow biopsy last week.  # Patient started Velcade day 1 and day 8 subcutaneous; day 1 today; weekly dexamethasone 40 mg a day.  We'll start Revlimid when available.  # Pain control-improved currently on hydrocodone twice a day;  also radiation. Status post Zometa.  # Hypocalcemia- recommend calcium and vitamin D twice a day  # Anemia hemoglobin 8. 6 improving.  # Follow-up with me on the 28th/Velcade. Weekly labs.

## 2016-03-28 NOTE — Progress Notes (Signed)
Initial treatment plan released was for Velcade IV. RN started IV fluids then pharmacy changed order to subcutaneous route for the velcade. Explained change to family and patient. IV was discontinued.

## 2016-03-28 NOTE — Progress Notes (Signed)
[facility] NOTE  Patient Care Team: Alfonse Flavors, MD as PCP - General (Family Medicine)  Francisco [name]; [facility]  CHIEF COMPLAINTS/PURPOSE OF CONSULTATION:  Multiple lytic lesions  #  Oncology History   # Multiple Myeloma lytic lesions- Thoracic spine/ vertebral compression fractures; IgG Kappa- 3.1gm/dl; K/L-140.   # Hoarseness of voice?  # Abnormal CXR;? 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  male  With New diagnosis of multiple myeloma IgG kappa- multiple bone lesions/anemia is here to proceed with cycle #1 day 1 of Velcade.   Patient started radiation last week to this posterior chest wall pain. Patient also had a bone marrow biopsy last week; patient also had a PET scan however could not complete secondary to pain.  Patient's pain is improved; his appetite is improving. He is taking hydrocodone twice a day.  Continues to have hoarseness of voice. Complains of constipation; is taking milk of magnesia.  ROS: A complete 10 point review of system is done which is negative except mentioned above in history of present illness  MEDICAL HISTORY:  Past Medical History:  Diagnosis Date  . Anemia   . Cancer (HCC)    Bone metastasis  . Constipation   . Hearing loss   . Hoarse voice quality   . Liver lesion   . Malaria 2003  . Multiple myeloma ([place]) 03/22/2016   Per Dr. Rogue Bussing on his PET order.  . Tuberculosis 1985    SURGICAL HISTORY: Past Surgical History:  Procedure Laterality Date  . 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 No smoking or alcohol.  Social History   Social History  . Marital status: Married    Spouse name: N/A  . Number of children: N/A  . Years of education: N/A   Occupational History  . Not on file.   Social History Main  Topics  . Smoking status: Never Smoker  . Smokeless tobacco: Never Used  . Alcohol use No  . Drug use: No  . Sexual activity: Not on file   Other Topics Concern  . Not on file   Social History Narrative  . No narrative on file    FAMILY HISTORY: no history of cancers in the family. No family history on file.  ALLERGIES:  has No Known Allergies.  MEDICATIONS:  Current Outpatient Prescriptions  Medication Sig Dispense Refill  . Cyanocobalamin (VITAMIN B 12 PO) Take 1 tablet by mouth.    . ferrous sulfate 325 (65 FE) MG tablet Take 325 mg by mouth daily with breakfast.    . HYDROcodone-acetaminophen (NORCO/VICODIN) 5-325 MG tablet Take 1 tablet by mouth every 4 (four) hours as needed for moderate pain. 60 tablet 0  . magnesium hydroxide (MILK OF MAGNESIA) 400 MG/5ML suspension Take 5 mLs by mouth daily as needed for mild constipation.    . Vitamin D, Ergocalciferol, (DRISDOL) 50000 units CAPS capsule Take 50,000 Units by mouth every 7 (seven) days.    Marland Kitchen acyclovir (ZOVIRAX) 400 MG tablet Take 1 tablet (400 mg total) by mouth 2 (two) times daily. 120 tablet 2  . dexamethasone (DECADRON) 4 MG tablet 10 pills once a day with food once today 03/22/16 (Patient not taking: Reported on 03/28/2016) 40 tablet 1  . lenalidomide (REVLIMID) 20 MG capsule Take 1 capsule (20 mg total) by  mouth daily. 3 weeks on and 1 week off. (Patient not taking: Reported on 03/28/2016) 21 capsule 4  . ondansetron (ZOFRAN) 8 MG tablet Take 1 tablet (8 mg total) by mouth every 8 (eight) hours as needed for nausea or vomiting. (Patient not taking: Reported on 03/28/2016) 30 tablet 3  . prochlorperazine (COMPAZINE) 10 MG tablet Take 1 tablet (10 mg total) by mouth every 6 (six) hours as needed for nausea or vomiting. (Patient not taking: Reported on 03/28/2016) 30 tablet 3   No current facility-administered medications for this visit.       Marland Kitchen  PHYSICAL EXAMINATION: ECOG PERFORMANCE STATUS: 1 - Symptomatic but  completely ambulatory  There were no vitals filed for this visit. There were no vitals filed for this visit.  GENERAL: Thin built moderately nourished male patient; Alert, no distress and comfortable.   With family.  EYES: no pallor or icterus OROPHARYNX: no thrush or ulceration; good dentition  NECK: supple, no masses felt LYMPH:  no palpable lymphadenopathy in the cervical, axillary or inguinal regions LUNGS: clear to auscultation and  No wheeze or crackles HEART/CVS: regular rate & rhythm and no murmurs; No lower extremity edema ABDOMEN: abdomen soft, non-tender and normal bowel sounds Musculoskeletal:no cyanosis of digits and no clubbing  PSYCH: alert & oriented x 3 with fluent speech NEURO: no focal motor/sensory deficits SKIN:  no rashes or significant lesions  LABORATORY DATA:  I have reviewed the data as listed Lab Results  Component Value Date   WBC 5.4 03/28/2016   HGB 8.6 (L) 03/28/2016   HCT 24.8 (L) 03/28/2016   MCV 87.9 03/28/2016   PLT 161 03/28/2016    Recent Labs  03/22/16 0859 03/28/16 0835  NA 129* 129*  K 4.5 5.7*  CL 99* 101  CO2 25 22  GLUCOSE 128* 115*  BUN 32* 13  CREATININE 0.90 0.86  CALCIUM 9.2 7.9*  GFRNONAA >60 >60  GFRAA >60 >60  PROT 8.1  --   ALBUMIN 3.2*  --   AST 35  --   ALT 38  --   ALKPHOS 87  --   BILITOT 0.7  --     RADIOGRAPHIC STUDIES: I have personally reviewed the radiological images as listed and agreed with the findings in the report. Dg Chest 2 View  Result Date: 03/07/2016 CLINICAL DATA:  Shortness of breath for 1 month. History of tuberculosis. EXAM: CHEST  2 VIEW COMPARISON:  None. FINDINGS: There is hyperinflation of the lungs compatible with COPD. Biapical scarring. Heart is normal size. No effusions. Rounded masslike pleural-based density noted posteriorly in the mid chest on the lateral view, not well seen on the frontal view. IMPRESSION: Rounded masslike posterior pleural based density on the lateral view.  Recommend chest CT for further evaluation. COPD/ chronic changes. Electronically Signed   By: Rolm Baptise M.D.   On: 03/07/2016 11:46   Ct Chest W Contrast  Result Date: 03/15/2016 CLINICAL DATA:  Abnormal chest x-ray.  Terrible back pain. EXAM: CT CHEST WITH CONTRAST TECHNIQUE: Multidetector CT imaging of the chest was performed during intravenous contrast administration. CONTRAST:  77m ISOVUE-300 IOPAMIDOL (ISOVUE-300) INJECTION 61% COMPARISON:  Two-view chest x-ray 03/07/2016 FINDINGS: Cardiovascular: The heart size is normal. No significant coronary artery calcifications are present. There is no significant pericardial effusion. Mediastinum/Nodes: No significant mediastinal or axillary adenopathy is present. Atherosclerotic calcifications are present at the aortic arch. Lungs/Pleura: Biapical pleural parenchymal scarring is present. There is upward traction on the hila bilaterally. Emphysematous changes are  noted. No focal nodule, mass, or airspace disease is evident. Upper Abdomen: Multiple hypodense lesions are present within the liver. The largest lesion is in segment 4 A measuring 14 mm. This may be a cyst. There are other more subtle hypodense lesions. Most worrisome is a far right lateral lesion in segment 7 on image 62 of series 2. This measures 15 mm. The visualized abdominal organs are otherwise unremarkable. Musculoskeletal: A large soft tissue lesion is centered along the posterior left ninth rib measuring 5.7 x 3.3 x 4.5 cm. There is no significant invasion of the spinal canal although the lesion does extend to the left T8-9 foramen. There is tumor invasion and the posterior elements of T9 on the left. An additional more lateral left T9 lytic lesion and pathologic fracture is noted. Focal irregularity of the posterior left T12 rib the likely represents a metastatic lesion as well. The pathologic fractures present at T10 with extraosseous tumor extending into the spinal canal at T10 and to the  right. There is tumor in both feet T9-10 and T10-11 foramen on the right. Marked osteopenia is present. There is lytic tumor involving the posterior elements on the right at T3 with probable involvement of the right T3-4 foramen. There is also a lytic lesion anteriorly within the T3 vertebral body without collapse. A far right T4 lesion measures 14 mm without collapse. In anterior lesion in T6 measures 14 mm. A 9 mm lesion is present within the right posterior element of T9. Infiltrative tumor is suspected posteriorly at T11 without collapse or extraosseous extension of tumor. Multiple other smaller spinous lesions are suspected. MRI of the thoracic spine without and with contrast would be useful for further evaluation. IMPRESSION: 1. Multiple lytic lesions throughout the thoracic spine and ribs as described suggesting metastatic disease. 2. The lesion evident on the chest x-ray corresponds with a 5.7 x 3.3 x 4.5 cm mass infiltrating the left T9 rib with extension to the left T8-9 foramen but no invasion of the foramen. 3. 4.1 x 3.1 cm exophytic mass lesion at T10 with pathologic fracture. The tumor extends into the spinal canal with moderate central canal stenosis and right greater than left foraminal narrowing at T9-10 and T10-11. 4. Prominent infiltrative tumor posteriorly at T11 without compression fracture. 5. Additional pathologic fracture within the more distal left ninth rib. 6. Marked osteopenia with probable hemangioma is an high suspicion for multiple additional lesions throughout the thoracic spine. Recommend MRI of the thoracic spine without and with contrast for further evaluation 7. Multiple hypodense lesions within the liver also raise concern for metastatic disease. Dedicated CT of the abdomen and pelvis with contrast is recommended for further evaluation of metastatic disease and possible primary source. 8. Emphysema and biapical scarring in the lungs without evidence for primary tumor. These  results were called by telephone at the time of interpretation on 03/15/2016 at 11:22 am to [facility], [place], who verbally acknowledged these results. Electronically Signed   By: San Morelle M.D.   On: 03/15/2016 11:44   Nm Pet Image Initial (pi) Whole Body  Result Date: 03/22/2016 CLINICAL DATA:  Subsequent treatment strategy for multiple myeloma EXAM: NUCLEAR MEDICINE PET WHOLE BODY TECHNIQUE: 14.2 mCi F-18 FDG was injected intravenously. CT data was obtained and used for attenuation correction and anatomic localization only. (This was not acquired as a diagnostic CT examination.) Additional exam technical data entered on technologist worksheet. FASTING BLOOD GLUCOSE:  Value: 105 mg/dl COMPARISON:  Chest CT 03/15/2016. FINDINGS: Comment:  Per report from the technologist, the patient was in considerable pain during the examination. Despite efforts to maintain an acceptable level of comfort for the patient, the patient was unable to tolerate the entire examination. CT images were obtained, but PET images were discontinued after acquisition through only the head. PET images through the neck, chest, abdomen, pelvis and lower extremities were not obtained. Accordingly, the examination will be interpreted as a noncontrast CT of the head, neck, chest, abdomen, pelvis, skeleton and extremities. Head/Neck: No definite intracranial mass. No hydrocephalus. No aggressive appearing osseous lesions are noted in the skull. No definite cervical lymphadenopathy noted on the noncontrast CT images. Chest: Heart size is normal. There is no significant pericardial fluid, thickening or pericardial calcification. Aortic atherosclerosis, without definite aneurysm. No definite lymphadenopathy noted in the mediastinal or hilar nodal stations. Please note that accurate exclusion of hilar adenopathy is limited on noncontrast CT scans. Esophagus is unremarkable in appearance. No axillary lymphadenopathy. Mass effect upon the left  lower lobe (discussed below) related to expansile left ninth rib lesion. No suspicious appearing pulmonary nodules or masses. Mild bilateral apical nodular pleuroparenchymal thickening, most compatible with chronic post infectious or inflammatory scarring. No acute consolidative airspace disease. No pleural effusions. Abdomen/Pelvis: Several low-attenuation lesions are again noted in the liver, largest of which is in the central aspect of segment 8 measuring 1.4 x 0.9 cm. These are incompletely characterized on today's noncontrast CT examination, but are favored to represent tiny cysts. Gallbladder is poorly visualized and may be decompressed or surgically absent (no surgical clips are noted in the gallbladder fossa). Unenhanced appearance of the pancreas, spleen, bilateral adrenal glands and bilateral kidneys is unremarkable. No hydroureteronephrosis. Unenhanced appearance of the urinary bladder is normal. No pathologic dilatation of small bowel or colon. Skeleton/Extremities: Again noted are expansile lytic lesions involving the left ninth rib and T10 vertebral body where there is a previously recognized pathologic compression fracture. The lesion of the left ninth rib currently measures approximately 3.2 x 6.1 cm (image 130 of series 3) which is similar to the recent chest CT. Lesion in the right side of the T10 vertebral body extends into the adjacent paraspinal soft tissues and currently measures 3.4 x 3.5 cm, also similar to the prior examination. Large lytic lesion in the anterior aspect of the L4 vertebral body measuring 3.2 x 3.1 cm. Large lytic lesion in the right side of the sacrum just medial to the sacroiliac joint measuring 1.9 x 2.3 cm. Lytic lesion in the right ilium measuring 2.9 x 1.4 cm. Several other smaller lytic lesions are also noted. IMPRESSION: 1. Widespread lytic lesions throughout the visualized axial and appendicular skeleton, compatible with the reported clinical history of multiple  myeloma. This is most notable for the lytic lesion with pathologic compression fracture in the T10 vertebral body. No definite extra-skeletal involvement noted on today's examination, which was limited by lack of acquisition of diagnostic PET images secondary to patient pain during the examination. 2. Aortic atherosclerosis. 3. Additional incidental findings, as above. Electronically Signed   By: Vinnie Langton M.D.   On: 03/22/2016 14:41   Ct Biopsy  Result Date: 03/25/2016 INDICATION: Multiple bony lytic lesions.  Possible multiple myeloma. EXAM: CT BIOPSY MEDICATIONS: None. ANESTHESIA/SEDATION: Moderate (conscious) sedation was employed during this procedure. A total of Versed 1.0 mg and Fentanyl 50 mcg was administered intravenously. Moderate Sedation Time: 18 minutes. The patient's level of consciousness and vital signs were monitored continuously by radiology nursing throughout the procedure under my  direct supervision. COMPLICATIONS: None immediate. PROCEDURE: Informed written consent was obtained from the patient after a thorough discussion of the procedural risks, benefits and alternatives. All questions were addressed. Maximal Sterile Barrier Technique was utilized including mask, sterile gloves, sterile drape, hand hygiene and skin antiseptic. A timeout was performed prior to the initiation of the procedure. The patient was placed prone on the CT scanner. Posterior flank region was prepped and draped using sterile technique. Local anesthetic was applied. Under CT guidance, 22 gauge spinal needle is directed toward the posterior margin of the right iliac bone. Lidocaine was injected subperiosteally. Needle was removed. Then, also under CT guidance, trocar was directed through posterior cortex of right iliac bone. Bone marrow aspirations were obtained with and without heparin, and given to the pathology technologist who was present at that time. Then, bone marrow core biopsy was obtained, and this  sample was also given to the pathology technologist. Needle was removed and appropriate dressing was applied. IMPRESSION: Under CT guidance, percutaneous bone marrow aspiration and core biopsy of right iliac bone was performed. Electronically Signed   By: Marijo Conception, M.D.   On: 03/25/2016 10:14   Dg Bone Survey Met  Result Date: 03/16/2016 CLINICAL DATA:  Multiple myeloma EXAM: METASTATIC BONE SURVEY COMPARISON:  03/15/2016 FINDINGS: Lateral view of the skull shows no lytic or blastic lesion. Lateral view of the cervical spine shows diffuse osteopenia. No lytic or blastic lesions. Mild disc space flattening at C3-C4-C4-C5 and C5-C6 level. Frontal view bilateral shoulder shows no lytic or blastic lesion. Frontal view of the neck shows no lytic or blastic lesion. Bilateral humerus demonstrates no lytic or blastic lesion. Bilateral forearm shows no lytic or blastic lesions. There is dextroscoliosis lower thoracic spine. Mild levoscoliosis lumbar spine. Significant compression fracture of T10 vertebral body suspicious for pathologic fracture. Expansile lesion of the left eighth rib suspicious for metastatic disease. Frontal view of the pelvis shows no lytic or blastic lesions. Frontal view bilateral hip and proximal femur is unremarkable. Frontal view bilateral femur shows no lytic or blastic lesions. Bilateral tibia and fibula shows no lytic or blastic lesions. IMPRESSION: 1. There is probable pathologic fracture of T10 vertebral body suspicious for bony lesion. Expansile lesion of left posterior eighth rib suspicious for metastatic disease. Please see patient's CT scan of the chest from yesterday. Electronically Signed   By: Lahoma Crocker M.D.   On: 03/16/2016 17:00   Dg Hip Unilat With Pelvis 2-3 Views Right  Result Date: 03/07/2016 CLINICAL DATA:  Right hip pain.  No known injury. EXAM: DG HIP (WITH OR WITHOUT PELVIS) 2-3V RIGHT COMPARISON:  None. FINDINGS: Early degenerative changes in the hips bilaterally  with early spurring. Joint space is maintained. No acute bony abnormality. Specifically, no fracture, subluxation, or dislocation. Soft tissues are intact. SI joints are symmetric and unremarkable. IMPRESSION: No acute bony abnormality. Electronically Signed   By: Rolm Baptise M.D.   On: 03/07/2016 11:46    ASSESSMENT & PLAN:   Multiple myeloma in relapse (HCC) Multiple myeloma M protein- 3.1 g/dL; kappa/lambda ratio 140. Skeletal survey multiple bone lesions. PET scan pending; status post bone marrow biopsy last week.  # Patient started Velcade day 1 and day 8 subcutaneous; day 1 today; weekly dexamethasone 40 mg a day.  We'll start Revlimid when available.  # Pain control-improved currently on hydrocodone twice a day;  also radiation. Status post Zometa.  # Hypocalcemia- recommend calcium and vitamin D twice a day  # Anemia hemoglobin  8. 6 improving.  # Follow-up with me on the 28th/Velcade. Weekly labs.   All questions were answered. The patient knows to call the clinic with any problems, questions or concerns.    Cammie Sickle, MD 03/28/2016 5:28 PM

## 2016-03-29 ENCOUNTER — Encounter
Admission: RE | Admit: 2016-03-29 | Discharge: 2016-03-29 | Disposition: A | Discharge status: Home / Self Care | Payer: Medicare Other | Source: Ambulatory Visit | Attending: Internal Medicine | Admitting: Internal Medicine

## 2016-03-29 ENCOUNTER — Inpatient Hospital Stay: Payer: Medicare Other

## 2016-03-29 ENCOUNTER — Ambulatory Visit
Admission: RE | Admit: 2016-03-29 | Discharge: 2016-03-29 | Disposition: A | Discharge status: Home / Self Care | Payer: Medicare Other | Source: Ambulatory Visit | Attending: Radiation Oncology | Admitting: Radiation Oncology

## 2016-03-29 DIAGNOSIS — M899 Disorder of bone, unspecified: Secondary | ICD-10-CM | POA: Diagnosis present

## 2016-03-29 DIAGNOSIS — Z510 Encounter for antineoplastic radiation therapy: Secondary | ICD-10-CM | POA: Diagnosis not present

## 2016-03-29 LAB — GLUCOSE, CAPILLARY: GLUCOSE-CAPILLARY: 119 mg/dL — AB (ref 65–99)

## 2016-03-29 MED ORDER — FLUDEOXYGLUCOSE F - 18 (FDG) INJECTION
12.8300 | Freq: Once | INTRAVENOUS | Status: AC
  Administered 2016-03-29: 12.83 via INTRAVENOUS

## 2016-03-30 ENCOUNTER — Ambulatory Visit
Admission: RE | Admit: 2016-03-30 | Discharge: 2016-03-30 | Disposition: A | Discharge status: Home / Self Care | Payer: Medicare Other | Source: Ambulatory Visit | Attending: Radiation Oncology | Admitting: Radiation Oncology

## 2016-03-30 ENCOUNTER — Ambulatory Visit: Payer: Medicaid Other

## 2016-03-30 ENCOUNTER — Ambulatory Visit: Payer: Medicaid Other | Admitting: Internal Medicine

## 2016-03-30 ENCOUNTER — Other Ambulatory Visit: Payer: Medicaid Other

## 2016-03-30 DIAGNOSIS — Z510 Encounter for antineoplastic radiation therapy: Secondary | ICD-10-CM | POA: Diagnosis not present

## 2016-03-31 ENCOUNTER — Inpatient Hospital Stay: Payer: Medicare Other

## 2016-03-31 ENCOUNTER — Ambulatory Visit
Admission: RE | Admit: 2016-03-31 | Discharge: 2016-03-31 | Disposition: A | Discharge status: Home / Self Care | Payer: Medicare Other | Source: Ambulatory Visit | Attending: Radiation Oncology | Admitting: Radiation Oncology

## 2016-03-31 ENCOUNTER — Other Ambulatory Visit: Payer: Self-pay | Admitting: Internal Medicine

## 2016-03-31 VITALS — BP 112/72 | HR 72 | Temp 97.6°F | Resp 20

## 2016-03-31 DIAGNOSIS — Z510 Encounter for antineoplastic radiation therapy: Secondary | ICD-10-CM | POA: Diagnosis not present

## 2016-03-31 DIAGNOSIS — C9002 Multiple myeloma in relapse: Secondary | ICD-10-CM

## 2016-03-31 DIAGNOSIS — Z5111 Encounter for antineoplastic chemotherapy: Secondary | ICD-10-CM | POA: Diagnosis not present

## 2016-03-31 LAB — BASIC METABOLIC PANEL
ANION GAP: 3 — AB (ref 5–15)
BUN: 21 mg/dL — ABNORMAL HIGH (ref 6–20)
CHLORIDE: 104 mmol/L (ref 101–111)
CO2: 24 mmol/L (ref 22–32)
Calcium: 8.1 mg/dL — ABNORMAL LOW (ref 8.9–10.3)
Creatinine, Ser: 1.2 mg/dL (ref 0.61–1.24)
GFR calc non Af Amer: 59 mL/min — ABNORMAL LOW (ref >60)
Glucose, Bld: 93 mg/dL (ref 65–99)
POTASSIUM: 4.6 mmol/L (ref 3.5–5.1)
SODIUM: 131 mmol/L — AB (ref 135–145)

## 2016-03-31 MED ORDER — PROCHLORPERAZINE MALEATE 10 MG PO TABS
10.0000 mg | ORAL_TABLET | Freq: Once | ORAL | Status: AC
  Administered 2016-03-31: 10 mg via ORAL
  Filled 2016-03-31: qty 1

## 2016-03-31 MED ORDER — BORTEZOMIB CHEMO SQ INJECTION 3.5 MG (2.5MG/ML)
1.3000 mg/m2 | Freq: Once | INTRAMUSCULAR | Status: AC
  Administered 2016-03-31: 2 mg via SUBCUTANEOUS
  Filled 2016-03-31: qty 2

## 2016-03-31 MED ORDER — FENTANYL 12 MCG/HR TD PT72: 25.0000 ug | MEDICATED_PATCH | TRANSDERMAL | 0 refills | Status: DC

## 2016-04-01 ENCOUNTER — Ambulatory Visit
Admission: RE | Admit: 2016-04-01 | Discharge: 2016-04-01 | Disposition: A | Discharge status: Home / Self Care | Payer: Medicare Other | Source: Ambulatory Visit | Attending: Radiation Oncology | Admitting: Radiation Oncology

## 2016-04-01 DIAGNOSIS — Z510 Encounter for antineoplastic radiation therapy: Secondary | ICD-10-CM | POA: Diagnosis not present

## 2016-04-04 ENCOUNTER — Inpatient Hospital Stay: Payer: Medicare Other

## 2016-04-04 ENCOUNTER — Ambulatory Visit
Admission: RE | Admit: 2016-04-04 | Discharge: 2016-04-04 | Disposition: A | Discharge status: Home / Self Care | Payer: Medicare Other | Source: Ambulatory Visit | Attending: Radiation Oncology | Admitting: Radiation Oncology

## 2016-04-04 VITALS — BP 107/72 | HR 68 | Temp 97.7°F | Resp 16

## 2016-04-04 DIAGNOSIS — Z5111 Encounter for antineoplastic chemotherapy: Secondary | ICD-10-CM | POA: Diagnosis not present

## 2016-04-04 DIAGNOSIS — Z510 Encounter for antineoplastic radiation therapy: Secondary | ICD-10-CM | POA: Diagnosis not present

## 2016-04-04 DIAGNOSIS — C9002 Multiple myeloma in relapse: Secondary | ICD-10-CM

## 2016-04-04 LAB — CBC WITH DIFFERENTIAL/PLATELET
BASOS ABS: 0 10*3/uL (ref 0–0.1)
BASOS PCT: 0 %
Eosinophils Absolute: 0 10*3/uL (ref 0–0.7)
Eosinophils Relative: 0 %
HEMATOCRIT: 27 % — AB (ref 40.0–52.0)
Hemoglobin: 9.2 g/dL — ABNORMAL LOW (ref 13.0–18.0)
LYMPHS PCT: 12 %
Lymphs Abs: 0.4 10*3/uL — ABNORMAL LOW (ref 1.0–3.6)
MCH: 29.8 pg (ref 26.0–34.0)
MCHC: 34 g/dL (ref 32.0–36.0)
MCV: 87.8 fL (ref 80.0–100.0)
MONO ABS: 0.2 10*3/uL (ref 0.2–1.0)
Monocytes Relative: 7 %
NEUTROS ABS: 2.7 10*3/uL (ref 1.4–6.5)
NEUTROS PCT: 81 %
Platelets: 124 10*3/uL — ABNORMAL LOW (ref 150–440)
RBC: 3.07 MIL/uL — AB (ref 4.40–5.90)
RDW: 15.4 % — ABNORMAL HIGH (ref 11.5–14.5)
WBC: 3.3 10*3/uL — ABNORMAL LOW (ref 3.8–10.6)

## 2016-04-04 LAB — SAMPLE TO BLOOD BANK

## 2016-04-04 LAB — BASIC METABOLIC PANEL
ANION GAP: 3 — AB (ref 5–15)
BUN: 24 mg/dL — ABNORMAL HIGH (ref 6–20)
CALCIUM: 8.4 mg/dL — AB (ref 8.9–10.3)
CHLORIDE: 105 mmol/L (ref 101–111)
CO2: 23 mmol/L (ref 22–32)
Creatinine, Ser: 1.06 mg/dL (ref 0.61–1.24)
GFR calc non Af Amer: >60 mL/min (ref >60)
Glucose, Bld: 131 mg/dL — ABNORMAL HIGH (ref 65–99)
POTASSIUM: 4.3 mmol/L (ref 3.5–5.1)
Sodium: 131 mmol/L — ABNORMAL LOW (ref 135–145)

## 2016-04-04 MED ORDER — BORTEZOMIB CHEMO SQ INJECTION 3.5 MG (2.5MG/ML)
1.3000 mg/m2 | Freq: Once | INTRAMUSCULAR | Status: AC
  Administered 2016-04-04: 2 mg via SUBCUTANEOUS
  Filled 2016-04-04: qty 2

## 2016-04-04 MED ORDER — SODIUM CHLORIDE 0.9 % IV SOLN
Freq: Once | INTRAVENOUS | Status: DC
  Filled 2016-04-04: qty 1000

## 2016-04-04 MED ORDER — PROCHLORPERAZINE MALEATE 10 MG PO TABS
10.0000 mg | ORAL_TABLET | Freq: Once | ORAL | Status: AC
  Administered 2016-04-04: 10 mg via ORAL
  Filled 2016-04-04: qty 1

## 2016-04-04 MED ORDER — DEXAMETHASONE 4 MG PO TABS
40.0000 mg | ORAL_TABLET | ORAL | Status: DC
  Administered 2016-04-04: 40 mg via ORAL

## 2016-04-05 ENCOUNTER — Inpatient Hospital Stay: Payer: Medicare Other

## 2016-04-05 ENCOUNTER — Other Ambulatory Visit: Payer: Self-pay | Admitting: Internal Medicine

## 2016-04-05 ENCOUNTER — Inpatient Hospital Stay
Admission: EM | Admit: 2016-04-05 | Discharge: 2016-04-13 | DRG: 391 | Disposition: A | Discharge status: Home Health | Payer: Medicare Other | Attending: Internal Medicine | Admitting: Internal Medicine

## 2016-04-05 ENCOUNTER — Telehealth: Payer: Self-pay | Admitting: *Deleted

## 2016-04-05 ENCOUNTER — Ambulatory Visit
Admission: RE | Admit: 2016-04-05 | Discharge: 2016-04-05 | Disposition: A | Discharge status: Home / Self Care | Payer: Medicare Other | Source: Ambulatory Visit | Attending: Radiation Oncology | Admitting: Radiation Oncology

## 2016-04-05 DIAGNOSIS — R1310 Dysphagia, unspecified: Secondary | ICD-10-CM | POA: Diagnosis present

## 2016-04-05 DIAGNOSIS — Z79899 Other long term (current) drug therapy: Secondary | ICD-10-CM

## 2016-04-05 DIAGNOSIS — R634 Abnormal weight loss: Secondary | ICD-10-CM

## 2016-04-05 DIAGNOSIS — C9002 Multiple myeloma in relapse: Secondary | ICD-10-CM

## 2016-04-05 DIAGNOSIS — M549 Dorsalgia, unspecified: Secondary | ICD-10-CM | POA: Diagnosis present

## 2016-04-05 DIAGNOSIS — R490 Dysphonia: Secondary | ICD-10-CM | POA: Diagnosis present

## 2016-04-05 DIAGNOSIS — F329 Major depressive disorder, single episode, unspecified: Secondary | ICD-10-CM | POA: Diagnosis present

## 2016-04-05 DIAGNOSIS — E43 Unspecified severe protein-calorie malnutrition: Secondary | ICD-10-CM | POA: Diagnosis present

## 2016-04-05 DIAGNOSIS — K5900 Constipation, unspecified: Secondary | ICD-10-CM | POA: Diagnosis present

## 2016-04-05 DIAGNOSIS — Z681 Body mass index (BMI) 19 or less, adult: Secondary | ICD-10-CM

## 2016-04-05 DIAGNOSIS — K2900 Acute gastritis without bleeding: Principal | ICD-10-CM | POA: Diagnosis present

## 2016-04-05 DIAGNOSIS — D6959 Other secondary thrombocytopenia: Secondary | ICD-10-CM | POA: Diagnosis present

## 2016-04-05 DIAGNOSIS — E876 Hypokalemia: Secondary | ICD-10-CM | POA: Diagnosis present

## 2016-04-05 DIAGNOSIS — Z452 Encounter for adjustment and management of vascular access device: Secondary | ICD-10-CM

## 2016-04-05 DIAGNOSIS — H9190 Unspecified hearing loss, unspecified ear: Secondary | ICD-10-CM | POA: Diagnosis present

## 2016-04-05 DIAGNOSIS — C9000 Multiple myeloma not having achieved remission: Secondary | ICD-10-CM | POA: Diagnosis present

## 2016-04-05 DIAGNOSIS — D510 Vitamin B12 deficiency anemia due to intrinsic factor deficiency: Secondary | ICD-10-CM

## 2016-04-05 DIAGNOSIS — R64 Cachexia: Secondary | ICD-10-CM | POA: Diagnosis present

## 2016-04-05 DIAGNOSIS — G893 Neoplasm related pain (acute) (chronic): Secondary | ICD-10-CM | POA: Diagnosis present

## 2016-04-05 DIAGNOSIS — R066 Hiccough: Secondary | ICD-10-CM | POA: Diagnosis not present

## 2016-04-05 DIAGNOSIS — E860 Dehydration: Secondary | ICD-10-CM

## 2016-04-05 DIAGNOSIS — T451X5A Adverse effect of antineoplastic and immunosuppressive drugs, initial encounter: Secondary | ICD-10-CM | POA: Diagnosis present

## 2016-04-05 DIAGNOSIS — M6281 Muscle weakness (generalized): Secondary | ICD-10-CM

## 2016-04-05 DIAGNOSIS — R1110 Vomiting, unspecified: Secondary | ICD-10-CM

## 2016-04-05 DIAGNOSIS — R627 Adult failure to thrive: Secondary | ICD-10-CM | POA: Diagnosis present

## 2016-04-05 DIAGNOSIS — R112 Nausea with vomiting, unspecified: Secondary | ICD-10-CM | POA: Diagnosis present

## 2016-04-05 DIAGNOSIS — D630 Anemia in neoplastic disease: Secondary | ICD-10-CM | POA: Diagnosis present

## 2016-04-05 DIAGNOSIS — Z5111 Encounter for antineoplastic chemotherapy: Secondary | ICD-10-CM | POA: Diagnosis not present

## 2016-04-05 DIAGNOSIS — Z8611 Personal history of tuberculosis: Secondary | ICD-10-CM

## 2016-04-05 DIAGNOSIS — Z510 Encounter for antineoplastic radiation therapy: Secondary | ICD-10-CM | POA: Diagnosis not present

## 2016-04-05 DIAGNOSIS — R630 Anorexia: Secondary | ICD-10-CM

## 2016-04-05 MED ORDER — SODIUM CHLORIDE 0.9 % IV SOLN
Freq: Once | INTRAVENOUS | Status: AC
Start: 2016-04-05 — End: 2016-04-05
  Administered 2016-04-05: 11:00:00 via INTRAVENOUS
  Filled 2016-04-05: qty 1000

## 2016-04-05 MED ORDER — SODIUM CHLORIDE 0.9 % IV SOLN
Freq: Once | INTRAVENOUS | Status: AC
  Administered 2016-04-05: 11:00:00 via INTRAVENOUS
  Filled 2016-04-05: qty 4

## 2016-04-05 MED ORDER — MORPHINE SULFATE 2 MG/ML IJ SOLN
1.0000 mg | Freq: Once | INTRAMUSCULAR | Status: AC
Start: 2016-04-05 — End: 2016-04-05
  Administered 2016-04-05: 1 mg via INTRAVENOUS
  Filled 2016-04-05: qty 1

## 2016-04-05 NOTE — ED Triage Notes (Signed)
Pt had multiple myeloma and has been not had any intake since yest. Saw pmd today and had IVF but still not able to keep food down. Had chemo yesterday and radiation today.

## 2016-04-05 NOTE — ED Provider Notes (Signed)
[facility] Emergency Department Provider Note    First MD Initiated Contact with Patient 04/05/16 2340     (approximate)  I have reviewed the triage vital signs and the nursing notes.   HISTORY  Chief Complaint Emesis   HPI Francisco [name] is a 70 y.o. male with history of multiple myeloma currently undergoing radiation and chemotherapy presents to the emergency department with very poor by mouth intake times one week. He states he's had a total of a half a cup of liquid today which he subsequently vomited. In addition the family states after vomiting associated patient has had coughing spells. Family denies any fever patient afebrile on presentation with temperature 98.2.   Past Medical History:  Diagnosis Date  . Anemia   . Cancer (HCC)    Bone metastasis  . Constipation   . Hearing loss   . Hoarse voice quality   . Liver lesion   . Malaria 2003  . Multiple myeloma ([place]) 03/22/2016   Per Dr. Rogue Bussing on his PET order.  . Tuberculosis 1985    Patient Active Problem List   Diagnosis Date Noted  . Intractable nausea and vomiting 04/06/2016  . Dehydration 04/05/2016  . Multiple myeloma in relapse ([place]) 03/16/2016  . Pernicious anemia 03/16/2016    Past Surgical History:  Procedure Laterality Date  . RETINAL DETACHMENT SURGERY  2006    Prior to Admission medications   Medication Sig Start Date End Date Taking? Authorizing Provider  acyclovir (ZOVIRAX) 400 MG tablet Take 1 tablet (400 mg total) by mouth 2 (two) times daily. 03/28/16  Yes Cammie Sickle, MD  cholecalciferol (VITAMIN D) 1000 units tablet Take 1,000 Units by mouth daily.   Yes Historical Provider, MD  HYDROcodone-acetaminophen (NORCO/VICODIN) 5-325 MG tablet Take 1 tablet by mouth every 4 (four) hours as needed for moderate pain. 03/18/16  Yes Cammie Sickle, MD  magnesium hydroxide (MILK OF MAGNESIA) 400 MG/5ML suspension Take 5 mLs by mouth daily as needed for  mild constipation.   Yes Historical Provider, MD  ondansetron (ZOFRAN) 8 MG tablet Take 1 tablet (8 mg total) by mouth every 8 (eight) hours as needed for nausea or vomiting. 03/24/16  Yes Cammie Sickle, MD  prochlorperazine (COMPAZINE) 10 MG tablet Take 1 tablet (10 mg total) by mouth every 6 (six) hours as needed for nausea or vomiting. 03/24/16  Yes Cammie Sickle, MD  vitamin B-12 (CYANOCOBALAMIN) 1000 MCG tablet Take 1,000 mcg by mouth daily.   Yes Historical Provider, MD  Vitamin D, Ergocalciferol, (DRISDOL) 50000 units CAPS capsule Take 50,000 Units by mouth every 7 (seven) days.   Yes Historical Provider, MD  lenalidomide (REVLIMID) 20 MG capsule Take 1 capsule (20 mg total) by mouth daily. 3 weeks on and 1 week off. Patient not taking: Reported on 04/06/2016 03/23/16   Cammie Sickle, MD    Allergies No known drug allergies History reviewed. No pertinent family history.  Social History Social History  Substance Use Topics  . Smoking status: Never Smoker  . Smokeless tobacco: Never Used  . Alcohol use No    Review of Systems Constitutional: No fever/chills Eyes: No visual changes. ENT: No sore throat. Cardiovascular: Denies chest pain. Respiratory: Denies shortness of breath. Gastrointestinal: No abdominal pain.  No nausea, no vomiting.  No diarrhea.  No constipation. Genitourinary: Negative for dysuria. Musculoskeletal: Negative for back pain. Skin: Negative for rash. Neurological: Negative for headaches, focal weakness or numbness.  10-point ROS otherwise negative.  ____________________________________________   PHYSICAL EXAM:  VITAL SIGNS: ED Triage Vitals  Enc Vitals Group     BP 04/05/16 2259 123/71     Pulse Rate 04/05/16 2259 72     Resp 04/05/16 2259 18     Temp 04/05/16 2259 98.2 F (36.8 C)     Temp Source 04/05/16 2259 Oral     SpO2 --      Weight 04/05/16 2300 108 lb (49 kg)     Height --      Head Circumference --      Peak Flow  --      Pain Score 04/05/16 2300 5     Pain Loc --      Pain Edu? --      Excl. in [place]? --     Constitutional: Alert and oriented. Cachectic Eyes: Conjunctivae are normal. PERRL. EOMI. Head: Atraumatic. Mouth/Throat: Dry oral mucosa.  Oropharynx non-erythematous. Neck: No stridor.  No meningeal signs.   Cardiovascular: Normal rate, regular rhythm. Good peripheral circulation. Grossly normal heart sounds. Respiratory: Normal respiratory effort.  No retractions. Lungs CTAB. Gastrointestinal: Soft and nontender. No distention.  Musculoskeletal: No lower extremity tenderness nor edema. No gross deformities of extremities. Neurologic:  Normal speech and language. No gross focal neurologic deficits are appreciated.  Skin:  Skin is warm, dry and intact. No rash noted. Psychiatric: Mood and affect are normal. Speech and behavior are normal.  ____________________________________________   LABS (all labs ordered are listed, but only abnormal results are displayed)  Labs Reviewed  CBC - Abnormal; Notable for the following:       Result Value   RBC 2.84 (*)    Hemoglobin 8.6 (*)    HCT 25.0 (*)    RDW 15.3 (*)    Platelets 93 (*)    All other components within normal limits  COMPREHENSIVE METABOLIC PANEL - Abnormal; Notable for the following:    Chloride 113 (*)    Glucose, Bld 141 (*)    BUN 42 (*)    Calcium 8.4 (*)    Albumin 2.6 (*)    Anion gap 3 (*)    All other components within normal limits  TSH - Abnormal; Notable for the following:    TSH 0.149 (*)    All other components within normal limits  PHOSPHORUS  MAGNESIUM  HEMOGLOBIN A1C  CBC  BASIC METABOLIC PANEL   ____________________________________________    RADIOLOGY I, Anawalt N BROWN, personally viewed and evaluated these images (plain radiographs) as part of my medical decision making, as well as reviewing the written report by the radiologist.  Dg Chest Portable 1 View  Result Date: 04/06/2016 CLINICAL  DATA:  Cough, dyspnea. History of multiple myeloma, T10 pathologic fracture. EXAM: PORTABLE CHEST 1 VIEW COMPARISON:  PET-CT March 29, 2016 FINDINGS: Cardiac silhouette is moderately enlarged, mediastinal silhouette is nonsuspicious, mildly calcified aortic knob. Increased lung volumes. No pleural effusion or focal consolidation. No pneumothorax. Apical pleural thickening. Osteopenia. Cachexia. Bold severe T10 compression fracture. IMPRESSION: Moderate cardiomegaly.  Increased lung volumes. Osteopenia with severe T10 fracture corresponding known pathologic fracture. Electronically Signed   By: Elon Alas M.D.   On: 04/06/2016 03:07     Procedures    INITIAL IMPRESSION / ASSESSMENT AND PLAN / ED COURSE  Pertinent labs & imaging results that were available during my care of the patient were reviewed by me and considered in my medical decision making (see chart for details).  Patient given IV Zofran 8 mg 2  L IV normal saline. Given patient's inability to tolerate by mouth patient discussed with Dr. Marcille Blanco for hospital admission for further evaluation and management.   Clinical Course    ____________________________________________  FINAL CLINICAL IMPRESSION(S) / ED DIAGNOSES  Final diagnoses:  Intractable vomiting with nausea, vomiting of unspecified type     MEDICATIONS GIVEN DURING THIS VISIT:  Medications  HYDROcodone-acetaminophen (NORCO/VICODIN) 5-325 MG per tablet 1 tablet (1 tablet Oral Given 04/06/16 1833)  magnesium hydroxide (MILK OF MAGNESIA) suspension 5 mL (not administered)  enoxaparin (LOVENOX) injection 40 mg (40 mg Subcutaneous Not Given 04/06/16 0413)  0.9 %  sodium chloride infusion ( Intravenous New Bag/Given 04/06/16 1700)  acetaminophen (TYLENOL) tablet 650 mg (not administered)    Or  acetaminophen (TYLENOL) suppository 650 mg (not administered)  docusate sodium (COLACE) capsule 100 mg (100 mg Oral Not Given 04/06/16 0942)  ondansetron (ZOFRAN)  tablet 4 mg ( Oral See Alternative 04/06/16 0920)    Or  ondansetron (ZOFRAN) injection 4 mg (4 mg Intravenous Given 04/06/16 0920)  prochlorperazine (COMPAZINE) injection 10 mg (10 mg Intravenous Given 04/06/16 1213)  dronabinol (MARINOL) capsule 2.5 mg (2.5 mg Oral Given 04/06/16 1701)  famotidine (PEPCID) IVPB 20 mg premix (20 mg Intravenous Given 04/06/16 1207)  acyclovir (ZOVIRAX) 200 MG capsule 400 mg (400 mg Oral Not Given 04/06/16 0945)  MEDLINE mouth rinse (15 mLs Mouth Rinse Given 04/06/16 1211)  metoCLOPramide (REGLAN) injection 5 mg (5 mg Intravenous Given 04/06/16 1701)  chlorproMAZINE (THORAZINE) tablet 10 mg (not administered)  sucralfate (CARAFATE) tablet 1 g (1 g Oral Given 04/06/16 2034)  sodium chloride 0.9 % bolus 1,000 mL (0 mLs Intravenous Stopped 04/06/16 0219)  sodium chloride 0.9 % bolus 1,000 mL (0 mLs Intravenous Stopped 04/06/16 0219)  ondansetron (ZOFRAN) injection 4 mg (4 mg Intravenous Given 04/06/16 0019)  ondansetron (ZOFRAN) injection 4 mg (4 mg Intravenous Given 04/06/16 0221)  morphine 4 MG/ML injection 4 mg (4 mg Intravenous Given 04/06/16 0251)     NEW OUTPATIENT MEDICATIONS STARTED DURING THIS VISIT:  Current Discharge Medication List      Current Discharge Medication List      Current Discharge Medication List       Note:  This document was prepared using Dragon voice recognition software and may include unintentional dictation errors.    Gregor Hams, MD 04/06/16 [phone]

## 2016-04-05 NOTE — Telephone Encounter (Signed)
Patient's caregiver called Dr. Grayland Ormond on call md last night. Pt c/o dehydration.  MD advised pt's caregiver to take pt to ED.  Reviewed chart - pt never went to ED for evaluation.  Spoke with Dr. Rogue Bussing- pt needs to be added to sch. Today in infusion.  Spoke with Blima Singer., RN  in the infusion suite. Will accommodate patient today.   Attempted to contact patient no answer.  Spoke with Maudie Mercury, RN in radiation - pt currently in radiation dept. She will let pt and caregiver know to send pt to infusion. Tia will put patient in infusion sch and arrive patient in chl.

## 2016-04-06 ENCOUNTER — Other Ambulatory Visit: Payer: Self-pay | Admitting: Internal Medicine

## 2016-04-06 ENCOUNTER — Emergency Department: Payer: Medicare Other

## 2016-04-06 ENCOUNTER — Ambulatory Visit: Payer: Medicare Other

## 2016-04-06 DIAGNOSIS — R490 Dysphonia: Secondary | ICD-10-CM

## 2016-04-06 DIAGNOSIS — D696 Thrombocytopenia, unspecified: Secondary | ICD-10-CM

## 2016-04-06 DIAGNOSIS — R1013 Epigastric pain: Secondary | ICD-10-CM

## 2016-04-06 DIAGNOSIS — K5900 Constipation, unspecified: Secondary | ICD-10-CM

## 2016-04-06 DIAGNOSIS — G893 Neoplasm related pain (acute) (chronic): Secondary | ICD-10-CM | POA: Diagnosis not present

## 2016-04-06 DIAGNOSIS — C9000 Multiple myeloma not having achieved remission: Secondary | ICD-10-CM | POA: Diagnosis not present

## 2016-04-06 DIAGNOSIS — K2900 Acute gastritis without bleeding: Secondary | ICD-10-CM | POA: Diagnosis not present

## 2016-04-06 DIAGNOSIS — Z8611 Personal history of tuberculosis: Secondary | ICD-10-CM

## 2016-04-06 DIAGNOSIS — Z8613 Personal history of malaria: Secondary | ICD-10-CM

## 2016-04-06 DIAGNOSIS — R630 Anorexia: Secondary | ICD-10-CM | POA: Diagnosis not present

## 2016-04-06 DIAGNOSIS — M549 Dorsalgia, unspecified: Secondary | ICD-10-CM

## 2016-04-06 DIAGNOSIS — Z79899 Other long term (current) drug therapy: Secondary | ICD-10-CM

## 2016-04-06 DIAGNOSIS — R112 Nausea with vomiting, unspecified: Secondary | ICD-10-CM | POA: Diagnosis not present

## 2016-04-06 DIAGNOSIS — R066 Hiccough: Secondary | ICD-10-CM

## 2016-04-06 DIAGNOSIS — R531 Weakness: Secondary | ICD-10-CM

## 2016-04-06 DIAGNOSIS — K769 Liver disease, unspecified: Secondary | ICD-10-CM

## 2016-04-06 DIAGNOSIS — D649 Anemia, unspecified: Secondary | ICD-10-CM

## 2016-04-06 LAB — COMPREHENSIVE METABOLIC PANEL
ALBUMIN: 2.6 g/dL — AB (ref 3.5–5.0)
ALK PHOS: 78 U/L (ref 38–126)
ALT: 23 U/L (ref 17–63)
AST: 29 U/L (ref 15–41)
Anion gap: 3 — ABNORMAL LOW (ref 5–15)
BILIRUBIN TOTAL: 0.6 mg/dL (ref 0.3–1.2)
BUN: 42 mg/dL — AB (ref 6–20)
CALCIUM: 8.4 mg/dL — AB (ref 8.9–10.3)
CO2: 22 mmol/L (ref 22–32)
CREATININE: 1.15 mg/dL (ref 0.61–1.24)
Chloride: 113 mmol/L — ABNORMAL HIGH (ref 101–111)
GFR calc Af Amer: >60 mL/min (ref >60)
GFR calc non Af Amer: >60 mL/min (ref >60)
GLUCOSE: 141 mg/dL — AB (ref 65–99)
Potassium: 4.1 mmol/L (ref 3.5–5.1)
SODIUM: 138 mmol/L (ref 135–145)
TOTAL PROTEIN: 7.8 g/dL (ref 6.5–8.1)

## 2016-04-06 LAB — CBC
HCT: 25 % — ABNORMAL LOW (ref 40.0–52.0)
HEMOGLOBIN: 8.6 g/dL — AB (ref 13.0–18.0)
MCH: 30.3 pg (ref 26.0–34.0)
MCHC: 34.4 g/dL (ref 32.0–36.0)
MCV: 88 fL (ref 80.0–100.0)
Platelets: 93 10*3/uL — ABNORMAL LOW (ref 150–440)
RBC: 2.84 MIL/uL — AB (ref 4.40–5.90)
RDW: 15.3 % — ABNORMAL HIGH (ref 11.5–14.5)
WBC: 5.7 10*3/uL (ref 3.8–10.6)

## 2016-04-06 LAB — TSH: TSH: 0.149 u[IU]/mL — ABNORMAL LOW (ref 0.350–4.500)

## 2016-04-06 LAB — MAGNESIUM: Magnesium: 2.2 mg/dL (ref 1.7–2.4)

## 2016-04-06 LAB — PHOSPHORUS: Phosphorus: 2.9 mg/dL (ref 2.5–4.6)

## 2016-04-06 MED ORDER — SUCRALFATE 1 G PO TABS
1.0000 g | ORAL_TABLET | Freq: Three times a day (TID) | ORAL | Status: DC
  Administered 2016-04-06 – 2016-04-12 (×8): 1 g via ORAL
  Filled 2016-04-06 (×13): qty 1

## 2016-04-06 MED ORDER — FERROUS SULFATE 325 (65 FE) MG PO TABS
325.0000 mg | ORAL_TABLET | Freq: Every day | ORAL | Status: DC
  Filled 2016-04-06: qty 1

## 2016-04-06 MED ORDER — ONDANSETRON HCL 4 MG/2ML IJ SOLN
4.0000 mg | Freq: Once | INTRAMUSCULAR | Status: AC
  Administered 2016-04-06: 4 mg via INTRAVENOUS
  Filled 2016-04-06: qty 2

## 2016-04-06 MED ORDER — ACYCLOVIR 200 MG PO CAPS
400.0000 mg | ORAL_CAPSULE | Freq: Two times a day (BID) | ORAL | Status: DC
  Administered 2016-04-08 – 2016-04-10 (×3): 400 mg via ORAL
  Filled 2016-04-06 (×9): qty 2

## 2016-04-06 MED ORDER — FENTANYL 25 MCG/HR TD PT72: 25.0000 ug | MEDICATED_PATCH | TRANSDERMAL | Status: DC

## 2016-04-06 MED ORDER — ONDANSETRON HCL 4 MG PO TABS: 4.0000 mg | ORAL_TABLET | Freq: Four times a day (QID) | ORAL | Status: DC | PRN

## 2016-04-06 MED ORDER — ACYCLOVIR 400 MG PO TABS
400.0000 mg | ORAL_TABLET | Freq: Two times a day (BID) | ORAL | Status: DC
  Filled 2016-04-06: qty 1

## 2016-04-06 MED ORDER — DOCUSATE SODIUM 100 MG PO CAPS
100.0000 mg | ORAL_CAPSULE | Freq: Two times a day (BID) | ORAL | Status: DC
  Administered 2016-04-09 – 2016-04-12 (×4): 100 mg via ORAL
  Filled 2016-04-06 (×12): qty 1

## 2016-04-06 MED ORDER — LENALIDOMIDE 20 MG PO CAPS: 20.0000 mg | ORAL_CAPSULE | Freq: Every day | ORAL | Status: DC

## 2016-04-06 MED ORDER — VITAMIN D (ERGOCALCIFEROL) 1.25 MG (50000 UNIT) PO CAPS
50000.0000 [IU] | ORAL_CAPSULE | ORAL | Status: DC
  Filled 2016-04-06: qty 1

## 2016-04-06 MED ORDER — ONDANSETRON HCL 4 MG/2ML IJ SOLN
4.0000 mg | Freq: Once | INTRAMUSCULAR | Status: AC
  Administered 2016-04-06: 4 mg via INTRAVENOUS

## 2016-04-06 MED ORDER — MAGNESIUM HYDROXIDE 400 MG/5ML PO SUSP
5.0000 mL | Freq: Every day | ORAL | Status: DC | PRN
  Administered 2016-04-07 – 2016-04-09 (×3): 5 mL via ORAL
  Filled 2016-04-06 (×3): qty 30

## 2016-04-06 MED ORDER — ONDANSETRON HCL 4 MG/2ML IJ SOLN
INTRAMUSCULAR | Status: AC
  Administered 2016-04-06: 4 mg via INTRAVENOUS
  Filled 2016-04-06: qty 2

## 2016-04-06 MED ORDER — SODIUM CHLORIDE 0.9 % IV BOLUS (SEPSIS)
1000.0000 mL | Freq: Once | INTRAVENOUS | Status: AC
Start: 2016-04-06 — End: 2016-04-06
  Administered 2016-04-06: 1000 mL via INTRAVENOUS

## 2016-04-06 MED ORDER — ACETAMINOPHEN 650 MG RE SUPP: 650.0000 mg | Freq: Four times a day (QID) | RECTAL | Status: DC | PRN

## 2016-04-06 MED ORDER — PROCHLORPERAZINE EDISYLATE 5 MG/ML IJ SOLN
10.0000 mg | Freq: Four times a day (QID) | INTRAMUSCULAR | Status: DC | PRN
Start: 2016-04-06 — End: 2016-04-07
  Administered 2016-04-06: 10 mg via INTRAVENOUS
  Filled 2016-04-06 (×2): qty 2

## 2016-04-06 MED ORDER — ACETAMINOPHEN 325 MG PO TABS: 650.0000 mg | ORAL_TABLET | Freq: Four times a day (QID) | ORAL | Status: DC | PRN

## 2016-04-06 MED ORDER — DRONABINOL 2.5 MG PO CAPS
2.5000 mg | ORAL_CAPSULE | Freq: Two times a day (BID) | ORAL | Status: DC
  Administered 2016-04-06 – 2016-04-09 (×6): 2.5 mg via ORAL
  Filled 2016-04-06 (×10): qty 1

## 2016-04-06 MED ORDER — HYDROCODONE-ACETAMINOPHEN 5-325 MG PO TABS
1.0000 | ORAL_TABLET | ORAL | Status: DC | PRN
  Administered 2016-04-06 (×2): 1 via ORAL
  Filled 2016-04-06 (×2): qty 1

## 2016-04-06 MED ORDER — FAMOTIDINE IN NACL 20-0.9 MG/50ML-% IV SOLN
20.0000 mg | Freq: Every morning | INTRAVENOUS | Status: DC
  Administered 2016-04-06: 12:00:00 20 mg via INTRAVENOUS
  Filled 2016-04-06 (×2): qty 50

## 2016-04-06 MED ORDER — ORAL CARE MOUTH RINSE
15.0000 mL | Freq: Two times a day (BID) | OROMUCOSAL | Status: DC
  Administered 2016-04-06 – 2016-04-10 (×6): 15 mL via OROMUCOSAL

## 2016-04-06 MED ORDER — MORPHINE SULFATE (PF) 4 MG/ML IV SOLN
4.0000 mg | Freq: Once | INTRAVENOUS | Status: AC
  Administered 2016-04-06: 4 mg via INTRAVENOUS
  Filled 2016-04-06: qty 1

## 2016-04-06 MED ORDER — ENOXAPARIN SODIUM 40 MG/0.4ML ~~LOC~~ SOLN
40.0000 mg | Freq: Every day | SUBCUTANEOUS | Status: DC
  Administered 2016-04-07: 40 mg via SUBCUTANEOUS

## 2016-04-06 MED ORDER — CHLORPROMAZINE HCL 10 MG PO TABS
10.0000 mg | ORAL_TABLET | Freq: Four times a day (QID) | ORAL | Status: DC | PRN
Start: 2016-04-06 — End: 2016-04-07
  Administered 2016-04-06: 10 mg via ORAL
  Filled 2016-04-06 (×2): qty 1

## 2016-04-06 MED ORDER — VITAMIN B-12 1000 MCG PO TABS
1000.0000 ug | ORAL_TABLET | Freq: Every day | ORAL | Status: DC
  Filled 2016-04-06: qty 1

## 2016-04-06 MED ORDER — SODIUM CHLORIDE 0.9 % IV SOLN
INTRAVENOUS | Status: DC
  Administered 2016-04-06 – 2016-04-11 (×9): via INTRAVENOUS

## 2016-04-06 MED ORDER — ONDANSETRON HCL 4 MG/2ML IJ SOLN
4.0000 mg | Freq: Four times a day (QID) | INTRAMUSCULAR | Status: DC | PRN
  Administered 2016-04-06 – 2016-04-09 (×4): 4 mg via INTRAVENOUS
  Filled 2016-04-06 (×5): qty 2

## 2016-04-06 MED ORDER — METOCLOPRAMIDE HCL 5 MG/ML IJ SOLN
5.0000 mg | Freq: Three times a day (TID) | INTRAMUSCULAR | Status: DC | PRN
  Administered 2016-04-06: 17:00:00 5 mg via INTRAVENOUS
  Filled 2016-04-06: qty 2

## 2016-04-06 NOTE — Plan of Care (Signed)
Problem: SLP Dysphagia Goals Goal: Misc Dysphagia Goal Pt will safely tolerate po diet of least restrictive consistency w/ no overt s/s of aspiration noted by Staff/pt/family x3 sessions.    

## 2016-04-06 NOTE — Care Management (Signed)
Admitted to [facility] with the diagnosis of intractable nausea and vomiting. Lives with wife. Francisco [name], daughter-in-law is at the bedside. Friend is [name] [phone])  Prescriptions are filled at Pharmacy in [facility]. Last was at [facility] yesterday. No home Health. No skilled facility. No home oxygen. Uses a rolling walker to aid in ambulation. Decreased appetite. Lost 106 lbs in 4-5 weeks per daughter-in-law. Self feed, self dress, needs help with baths. No falls. Family will transport.  Shelbie Ammons RN MSN CCM Care Management [phone]

## 2016-04-06 NOTE — H&P (Signed)
Francisco [name] is an 70 y.o. male.   Chief Complaint: Nausea and vomiting HPI: The patient with past medical history significant for multiple myeloma presents emergency department complaining of recurrent nausea and vomiting. The patient recently underwent chemotherapy and received intravenous fluid in clinic. He tries to eat at home but vomits shortly after swallowing. The patient admits that he has difficulty swallowing and that his throat is sore. He denies fevers or diarrhea. Due to clinical dehydration and intractable nausea and vomiting the emergency department staff called for admission.  Past Medical History:  Diagnosis Date  . Anemia   . Cancer (HCC)    Bone metastasis  . Constipation   . Hearing loss   . Hoarse voice quality   . Liver lesion   . Malaria 2003  . Multiple myeloma ([place]) 03/22/2016   Per Dr. Rogue Bussing on his PET order.  . Tuberculosis 1985    Past Surgical History:  Procedure Laterality Date  . RETINAL DETACHMENT SURGERY  2006    History reviewed. No pertinent family history. Social History:  reports that he has never smoked. He has never used smokeless tobacco. He reports that he does not drink alcohol or use drugs.  Allergies: No Known Allergies  Medications Prior to Admission  Medication Sig Dispense Refill  . acyclovir (ZOVIRAX) 400 MG tablet Take 1 tablet (400 mg total) by mouth 2 (two) times daily. 120 tablet 2  . cholecalciferol (VITAMIN D) 1000 units tablet Take 1,000 Units by mouth daily.    Marland Kitchen HYDROcodone-acetaminophen (NORCO/VICODIN) 5-325 MG tablet Take 1 tablet by mouth every 4 (four) hours as needed for moderate pain. 60 tablet 0  . magnesium hydroxide (MILK OF MAGNESIA) 400 MG/5ML suspension Take 5 mLs by mouth daily as needed for mild constipation.    . ondansetron (ZOFRAN) 8 MG tablet Take 1 tablet (8 mg total) by mouth every 8 (eight) hours as needed for nausea or vomiting. 30 tablet 3  . prochlorperazine (COMPAZINE) 10 MG tablet Take  1 tablet (10 mg total) by mouth every 6 (six) hours as needed for nausea or vomiting. 30 tablet 3  . vitamin B-12 (CYANOCOBALAMIN) 1000 MCG tablet Take 1,000 mcg by mouth daily.    . Vitamin D, Ergocalciferol, (DRISDOL) 50000 units CAPS capsule Take 50,000 Units by mouth every 7 (seven) days.    Marland Kitchen lenalidomide (REVLIMID) 20 MG capsule Take 1 capsule (20 mg total) by mouth daily. 3 weeks on and 1 week off. (Patient not taking: Reported on 04/06/2016) 21 capsule 4    Results for orders placed or performed during the hospital encounter of 04/05/16 (from the past 48 hour(s))  CBC     Status: Abnormal   Collection Time: 04/06/16 12:14 AM  Result Value Ref Range   WBC 5.7 3.8 - 10.6 K/uL   RBC 2.84 (L) 4.40 - 5.90 MIL/uL   Hemoglobin 8.6 (L) 13.0 - 18.0 g/dL   HCT 25.0 (L) 40.0 - 52.0 %   MCV 88.0 80.0 - 100.0 fL   MCH 30.3 26.0 - 34.0 pg   MCHC 34.4 32.0 - 36.0 g/dL   RDW 15.3 (H) 11.5 - 14.5 %   Platelets 93 (L) 150 - 440 K/uL  Comprehensive metabolic panel     Status: Abnormal   Collection Time: 04/06/16 12:14 AM  Result Value Ref Range   Sodium 138 135 - 145 mmol/L   Potassium 4.1 3.5 - 5.1 mmol/L   Chloride 113 (H) 101 - 111 mmol/L   CO2 22  22 - 32 mmol/L   Glucose, Bld 141 (H) 65 - 99 mg/dL   BUN 42 (H) 6 - 20 mg/dL   Creatinine, Ser 1.15 0.61 - 1.24 mg/dL   Calcium 8.4 (L) 8.9 - 10.3 mg/dL   Total Protein 7.8 6.5 - 8.1 g/dL   Albumin 2.6 (L) 3.5 - 5.0 g/dL   AST 29 15 - 41 U/L   ALT 23 17 - 63 U/L   Alkaline Phosphatase 78 38 - 126 U/L   Total Bilirubin 0.6 0.3 - 1.2 mg/dL   GFR calc non Af Amer >60 >60 mL/min   GFR calc Af Amer >60 >60 mL/min    Comment: (NOTE) The eGFR has been calculated using the CKD EPI equation. This calculation has not been validated in all clinical situations. eGFR's persistently <60 mL/min signify possible Chronic Kidney Disease.    Anion gap 3 (L) 5 - 15  Phosphorus     Status: None   Collection Time: 04/06/16 12:14 AM  Result Value Ref Range    Phosphorus 2.9 2.5 - 4.6 mg/dL  Magnesium     Status: None   Collection Time: 04/06/16 12:14 AM  Result Value Ref Range   Magnesium 2.2 1.7 - 2.4 mg/dL  TSH     Status: Abnormal   Collection Time: 04/06/16 12:14 AM  Result Value Ref Range   TSH 0.149 (L) 0.350 - 4.500 uIU/mL   Dg Chest Portable 1 View  Result Date: 04/06/2016 CLINICAL DATA:  Cough, dyspnea. History of multiple myeloma, T10 pathologic fracture. EXAM: PORTABLE CHEST 1 VIEW COMPARISON:  PET-CT March 29, 2016 FINDINGS: Cardiac silhouette is moderately enlarged, mediastinal silhouette is nonsuspicious, mildly calcified aortic knob. Increased lung volumes. No pleural effusion or focal consolidation. No pneumothorax. Apical pleural thickening. Osteopenia. Cachexia. Bold severe T10 compression fracture. IMPRESSION: Moderate cardiomegaly.  Increased lung volumes. Osteopenia with severe T10 fracture corresponding known pathologic fracture. Electronically Signed   By: Elon Alas M.D.   On: 04/06/2016 03:07    Review of Systems  Constitutional: Negative for chills and fever.  HENT: Negative for sore throat and tinnitus.   Eyes: Negative for blurred vision and redness.  Respiratory: Negative for cough and shortness of breath.   Cardiovascular: Negative for chest pain, palpitations, orthopnea and PND.  Gastrointestinal: Positive for nausea and vomiting. Negative for abdominal pain and diarrhea.  Genitourinary: Negative for dysuria, frequency and urgency.  Musculoskeletal: Positive for back pain (chronic). Negative for joint pain and myalgias.  Skin: Negative for rash.       No lesions  Neurological: Negative for speech change, focal weakness and weakness.  Endo/Heme/Allergies: Does not bruise/bleed easily.       No temperature intolerance  Psychiatric/Behavioral: Negative for depression and suicidal ideas.    Blood pressure (!) 111/56, pulse 65, temperature 97.9 F (36.6 C), temperature source Oral, resp. rate 20,  height 5' 10"  (1.778 m), weight 47.1 kg (103 lb 14.4 oz), SpO2 100 %. Physical Exam  Constitutional: He is oriented to person, place, and time. He appears well-developed. He appears cachectic. No distress.  HENT:  Head: Normocephalic and atraumatic.  Mouth/Throat: Oropharynx is clear and moist.  Eyes: Conjunctivae and EOM are normal. Pupils are equal, round, and reactive to light. No scleral icterus.  Neck: Normal range of motion. Neck supple. No JVD present. No tracheal deviation present. No thyromegaly present.  Cardiovascular: Normal rate and regular rhythm.  Exam reveals no gallop and no friction rub.   No murmur heard. Respiratory: Effort  normal and breath sounds normal. No respiratory distress.  GI: Soft. Bowel sounds are normal. He exhibits no distension. There is no tenderness.  Genitourinary:  Genitourinary Comments: Deferred  Musculoskeletal: Normal range of motion. He exhibits no edema.  Lymphadenopathy:    He has no cervical adenopathy.  Neurological: He is alert and oriented to person, place, and time. No cranial nerve deficit.  Skin: Skin is warm and dry. No rash noted. No erythema.  Psychiatric: He has a normal mood and affect. His behavior is normal. Judgment and thought content normal.     Assessment/Plan This is a 70 year old male admitted for intractable nausea and vomiting. 1. Intractable nausea and vomiting: Zofran as needed. Compazine for refractory nausea. The patient appears dehydrated clinically but has normal renal function. Marinol for nausea and appetite stimulation 2. Multiple myeloma: Continue lenalidomide 3. Dysphagia: Consult speech pathology 4. DVT prophylaxis: Lovenox 5. GI prophylaxis: H2 blocker as needed  Harrie Foreman, MD 04/06/2016, 5:26 AM

## 2016-04-06 NOTE — Evaluation (Signed)
Clinical/Bedside Swallow Evaluation Patient Details  Name: Francisco [name] MRN: [id number] Date of Birth: 04-26-1946  Today's Date: 04/06/2016 Time: SLP Start Time (ACUTE ONLY): 0900 SLP Stop Time (ACUTE ONLY): 1000 SLP Time Calculation (min) (ACUTE ONLY): 60 min  Past Medical History:  Past Medical History:  Diagnosis Date  . Anemia   . Cancer (HCC)    Bone metastasis  . Constipation   . Hearing loss   . Hoarse voice quality   . Liver lesion   . Malaria 2003  . Multiple myeloma ([place]) 03/22/2016   Per Dr. Rogue Bussing on his PET order.  . Tuberculosis 1985   Past Surgical History:  Past Surgical History:  Procedure Laterality Date  . RETINAL DETACHMENT SURGERY  2006   HPI:  Pt is a 70 y.o. male with a past medical history significant for multiple myeloma presents emergency department complaining of recurrent nausea and vomiting. The patient recently underwent chemotherapy and received intravenous fluid in clinic. He tries to eat at home but vomits shortly after swallowing. The patient admits that he has difficulty swallowing and that his throat is sore. He denies fevers or diarrhea. Due to clinical dehydration and intractable nausea and vomiting the emergency department staff called for admission. Per report from staff/family member Pt has experienced significant weight loss in the past 4-6 weeks. Pt has been eating some food and drinking sips per report by family, but Pt usually experiences nausea and vomiting. Family and Pt denied choking or coughing with POs.    Assessment / Plan / Recommendation Clinical Impression  Pt appeared to adequately tolerate (and swallow) PO trials of thin liquids with no immediate overt s/s of aspiration. Pt developed the hiccups after drinking each extremely small sip of thin liquid from a cup. Unsure if this is due to esophageal discomfort due to recent Chemo and Radiation therapy and swallowing of extra air with the liquid trials (aerophagia). Pt  stated small bites of solid foods give him less discomfort then liquids, and following the hiccups he experiences feelings of nausea. After eval session Pt recieved breakfast tray and was able to adequately swallow trials of soft solid foods with no oral pharyngeal phase dysphagia, per reported by family. ST services recommends a Regular diet with Thin liquids, foods and liquids at the discretion and comfort of the Pt, following aspiration precautions. Dietician was consulted due to staff/family report of significant weightloss in the past 4-6 weeks. Reccomend follow up with GI services due to apparent esophageal discomfort and consistent N/V in order to better address nutrition needs. Nursing and MD consulted and agree with recommendation.       Aspiration Risk   (Reduced Aspiration Risk)    Diet Recommendation Regular;Thin liquid (Soft foods) , Aspiration Precautions, Reflux Precautions  Liquid Administration via: Cup;Straw Medication Administration: Crushed with puree (as able) Supervision: Patient able to self feed Compensations: Minimize environmental distractions;Slow rate;Small sips/bites;Follow solids with liquid Postural Changes: Seated upright at 90 degrees;Remain upright for at least 30 minutes after po intake    Other  Recommendations Recommended Consults: Consider GI evaluation (Dietician Consult) Oral Care Recommendations: Oral care BID;Staff/trained caregiver to provide oral care   Follow up Recommendations None      Frequency and Duration            Prognosis Prognosis for Safe Diet Advancement: Good (For swallowing) Barriers to Reach Goals:  (Medical Condition)      Swallow Study   General Date of Onset: 04/05/16 HPI: Pt is a 70  y.o. male with a past medical history significant for multiple myeloma presents emergency department complaining of recurrent nausea and vomiting. The patient recently underwent chemotherapy and received intravenous fluid in clinic. He tries to  eat at home but vomits shortly after swallowing. The patient admits that he has difficulty swallowing and that his throat is sore. He denies fevers or diarrhea. Due to clinical dehydration and intractable nausea and vomiting the emergency department staff called for admission. Per report from staff/family member Pt has experienced significant weight loss in the past 4-6 weeks. Pt has been eating some food and drinking sips per report by family, but Pt usually experiences nausea and vomiting. Family and Pt denied choking or coughing with POs.  Type of Study: Bedside Swallow Evaluation Previous Swallow Assessment: None indicated Diet Prior to this Study: Regular;Thin liquids Temperature Spikes Noted: No (WBC 5.7) Respiratory Status: Room air History of Recent Intubation: No Behavior/Cognition: Alert;Cooperative;Requires cueing Oral Cavity Assessment: Within Functional Limits Oral Care Completed by SLP: Recent completion by staff Vision: Functional for self-feeding Self-Feeding Abilities: Able to feed self;Needs set up Patient Positioning: Upright in bed Baseline Vocal Quality: Hoarse;Low vocal intensity (Family reported ENT consult pending) Volitional Cough: Strong Volitional Swallow: Able to elicit    Oral/Motor/Sensory Function Overall Oral Motor/Sensory Function: Within functional limits (with bolus management)   Ice Chips Ice chips: Not tested   Thin Liquid Thin Liquid: Within functional limits Presentation: Cup (2 extremely small trials) Other Comments: Pt immediatly exhibited hiccups following the liquid trials and Pt complained of nausea    Nectar Thick Nectar Thick Liquid: Not tested   Honey Thick Honey Thick Liquid: Not tested   Puree Puree: Not tested Other Comments: Pt declined   Solid   GO   Solid: Not tested Other Comments: Pt declined at time of the eval        Rivka Safer, B.A. Clinical Graduate Student 04/06/2016,1:02 PM   This information has been reviewed and  agreed upon by this supervising clinician.  Orinda Kenner, [place], CCC-SLP

## 2016-04-06 NOTE — Progress Notes (Signed)
Initial Nutrition Assessment  DOCUMENTATION CODES:   Severe malnutrition in context of chronic illness  INTERVENTION:  -Recommend continue Marinol -- as home med as well -Magic cup TID with meals, each supplement provides 290 kcal and 9 grams of protein -Nausea and vomiting needs to be under control first, then patient should be able to consume food  NUTRITION DIAGNOSIS:   Malnutrition related to chronic illness as evidenced by severe depletion of muscle mass, severe depletion of body fat, percent weight loss.  GOAL:   Patient will meet greater than or equal to 90% of their needs  MONITOR:   PO intake, I & O's, Supplement acceptance, Labs, Weight trends  REASON FOR ASSESSMENT:   Other (Comment) (Low BMI)    ASSESSMENT:   The patient with past medical history significant for multiple myeloma presents emergency department complaining of recurrent nausea and vomiting. The patient recently underwent chemotherapy and received intravenous fluid in clinic.  Spoke with Mr. Wiedeman daughter, she acted as a Optometrist. Pt has lost a few pounds since previous admission, 5#/4.6% over 15 days, severe for the time frame. Daughter states patient experiences burping and hiccups when consuming liquids. She denies a history of acid reflux or GERD. PO intake has been poor for the past 2 days with consistent nausea and vomiting. Daughter tried to feed pt this morning which resulted in vomiting, but states if we get that under control patient will eat.  Nutrition-Focused physical exam completed. Findings are severe fat depletion, severe muscle depletion, and no edema.  Patient was requesting buttermilk, but apparently we do not have any in the kitchen. Labs and medications reviewed: B12, Vitamin D, Iron, Marinol, Colace, Milk of Magnesia NS @ 166m/hr   Diet Order:  Diet regular Room service appropriate? Yes; Fluid consistency: Thin  Skin:     Last BM:  9/25  Height:   Ht Readings from  Last 1 Encounters:  04/06/16 5' 10"  (1.778 m)    Weight:   Wt Readings from Last 1 Encounters:  04/06/16 103 lb 14.4 oz (47.1 kg)    Ideal Body Weight:  75.45 kg  BMI:  Body mass index is 14.91 kg/m.  Estimated Nutritional Needs:   Kcal:  1400-1650 calories  Protein:  56-70 gm  Fluid:  >/= 1.4L  EDUCATION NEEDS:   No education needs identified at this time  WSatira Anis Reeves Musick, MS, RD LDN Inpatient Clinical Dietitian Pager [phone]

## 2016-04-06 NOTE — Progress Notes (Addendum)
Admitted  this morning because of nausea, vomiting. Last chemotherapy was the on Monday. No abdominal pain, no difficulty in swallowing. No throat pain. No fever. Has some dry cough.  Vitals: Blood pressure 105/70, heart rate 82, respirations 18, sats 97% on room air. Review of systems: Alert, awake, oriented Cardiovascular/regular no murmurs GI patient has nausea, hiccups. And the patient's daughter says that the patient cannot take liquids but the patient takes solids.  Physical examination: Patient appears cachectic.   Cardiovascular S1-S2 regular Lungs clear to auscultation, no wheezes no rales. GI abdomen is soft nontender nondistended positive. Neurological he is alert, awake, oriented. No focal neurological deficit.  Lab data revealed no acute abnormality. Has mild dehydration with BUN up to 42. Assessment and plan: #1 multiple myeloma with chemotherapy and radiation, came in because of intractable nausea, vomiting, poor by mouth intake. Patient last chemotherapy was Monday. Continue IV fluids, IV nausea medicine, IV PPIs patient has no dysphagia,  Or odynophagia. Will discuss with oncology. Discussed with patient, patient's daughter at length. Speech therapy, patient nurse present during the rounds. Start the patient on regular diet and see how  He does. 2 /back pain secondary to compression fracture of T12 with multiple myeloma.MM with  Lytic lesions continue pain control. Patient getting radiation therapy also.  Spoke to  Dr.Brahmanday ,he recommends GI ,ENT consult for nausea ,vomting,and hoarseness respectively, He will see the patient also. Time spent ;30 min

## 2016-04-06 NOTE — Plan of Care (Signed)
Problem: Education: Goal: Knowledge of [facility] Education information/materials will improve Outcome: Progressing Pt likes to be called Jeramiah  MEDICAL HISTORY:      Past Medical History:  Diagnosis Date  . Anemia   . Cancer (HCC)    Bone metastasis  . Constipation   . Hearing loss   . Hoarse voice quality   . Liver lesion   . Malaria 2003  . Multiple myeloma ([place]) 03/22/2016   Per Dr. Rogue Bussing on his PET order.  . Tuberculosis    Pt is well controlled with home medications

## 2016-04-06 NOTE — Plan of Care (Signed)
Problem: Fluid Volume: Goal: Ability to maintain a balanced intake and output will improve Outcome: Not Progressing Poor appetite. Pt unable to tolerate most of his meds due to nausea. Nausea meds given x3 during the shift. Pt continues to feel nauseated. MD is aware. IVF infusing.   Problem: Nutrition: Goal: Adequate nutrition will be maintained Outcome: Not Progressing As above.

## 2016-04-06 NOTE — Progress Notes (Signed)
[facility] CONSULT NOTE  Patient Care Team: Alfonse Flavors, MD as PCP - General (Family Medicine)  CHIEF COMPLAINTS/PURPOSE OF CONSULTATION:  Persistent nausea vomiting/multiple myeloma  HISTORY OF PRESENTING ILLNESS:  Francisco [name] 70 y.o.  male [place]- recently diagnosed multiple myeloma- currently on Velcade and dexamethasone- is currently admitted to the hospital for intractable nausea vomiting poor by mouth intake.  As per the family, patient noted to have persistent nausea vomiting- green-colored vomitus; no blood for the last 3-4 days. Complaints of dyspepsia; Hicupps- with difficulty ingesting any for liquids. His by mouth intake has been significantly low in the last few days.   Patient's pain- the back is improved; currently on radiation.   Denies any abdominal pain. Denies any constipation or diarrhea. No fever no chills. Generalized weakness. Also complains of hoarseness of voice for the last 4-6 weeks.  ROS: A complete 10 point review of system is done which is negative except mentioned above in history of present illness  MEDICAL HISTORY:  Past Medical History:  Diagnosis Date  . Anemia   . Cancer (HCC)    Bone metastasis  . Constipation   . Hearing loss   . Hoarse voice quality   . Liver lesion   . Malaria 2003  . Multiple myeloma ([place]) 03/22/2016   Per Dr. Rogue Bussing on his PET order.  . Tuberculosis 1985    SURGICAL HISTORY: Past Surgical History:  Procedure Laterality Date  . RETINAL DETACHMENT SURGERY  2006    SOCIAL HISTORY: Lives in [place]. No smoking or alcohol. he teaches yoga. Social History   Social History  . Marital status: Married    Spouse name: N/A  . Number of children: N/A  . Years of education: N/A   Occupational History  . Not on file.   Social History Main Topics  . Smoking status: Never Smoker  . Smokeless tobacco: Never Used  . Alcohol use No  . Drug use: No  . Sexual activity: Not on file    Other Topics Concern  . Not on file   Social History Narrative  . No narrative on file    FAMILY HISTORY: History reviewed. No pertinent family history.  ALLERGIES:  has No Known Allergies.  MEDICATIONS:  Current Facility-Administered Medications  Medication Dose Route Frequency Provider Last Rate Last Dose  . 0.9 %  sodium chloride infusion   Intravenous Continuous Harrie Foreman, MD 100 mL/hr at 04/06/16 1700    . acetaminophen (TYLENOL) tablet 650 mg  650 mg Oral Q6H PRN Harrie Foreman, MD       Or  . acetaminophen (TYLENOL) suppository 650 mg  650 mg Rectal Q6H PRN Harrie Foreman, MD      . acyclovir (ZOVIRAX) 200 MG capsule 400 mg  400 mg Oral BID Harrie Foreman, MD      . docusate sodium (COLACE) capsule 100 mg  100 mg Oral BID Harrie Foreman, MD      . dronabinol (MARINOL) capsule 2.5 mg  2.5 mg Oral BID AC Harrie Foreman, MD   2.5 mg at 04/06/16 1701  . enoxaparin (LOVENOX) injection 40 mg  40 mg Subcutaneous Daily Harrie Foreman, MD      . famotidine (PEPCID) IVPB 20 mg premix  20 mg Intravenous q morning - 10a [name], MD   20 mg at 04/06/16 1207  . ferrous sulfate tablet 325 mg  325 mg Oral Q breakfast Harrie Foreman, MD      .  HYDROcodone-acetaminophen (NORCO/VICODIN) 5-325 MG per tablet 1 tablet  1 tablet Oral Q4H PRN Harrie Foreman, MD      . magnesium hydroxide (MILK OF MAGNESIA) suspension 5 mL  5 mL Oral Daily PRN Harrie Foreman, MD      . MEDLINE mouth rinse  15 mL Mouth Rinse BID [name], MD   15 mL at 04/06/16 1211  . metoCLOPramide (REGLAN) injection 5 mg  5 mg Intravenous Q8H PRN [name], MD   5 mg at 04/06/16 1701  . ondansetron (ZOFRAN) tablet 4 mg  4 mg Oral Q6H PRN Harrie Foreman, MD       Or  . ondansetron [facility]) injection 4 mg  4 mg Intravenous Q6H PRN Harrie Foreman, MD   4 mg at 04/06/16 0920  . prochlorperazine (COMPAZINE) injection 10 mg  10 mg Intravenous Q6H PRN Harrie Foreman, MD   10 mg at 04/06/16 1213  . vitamin B-12 (CYANOCOBALAMIN) tablet 1,000 mcg  1,000 mcg Oral Daily Harrie Foreman, MD      . Vitamin D (Ergocalciferol) (DRISDOL) capsule 50,000 Units  50,000 Units Oral Q7 days Harrie Foreman, MD          .  PHYSICAL EXAMINATION:  Vitals:   04/06/16 0346 04/06/16 1259  BP: (!) 111/56 119/70  Pulse: 65 85  Resp: 20 18  Temp: 97.9 F (36.6 C) 97.5 F (36.4 C)   Filed Weights   04/05/16 2300 04/06/16 0511  Weight: 108 lb (49 kg) 103 lb 14.4 oz (47.1 kg)    GENERAL: Cachectic appearing; mild distress because of nausea and vomiting.  EYES: no pallor or icterus OROPHARYNX: no thrush or ulceration. NECK: supple, no masses felt LYMPH:  no palpable lymphadenopathy in the cervical, axillary or inguinal regions LUNGS: decreased breath sounds to auscultation at bases and  No wheeze or crackles HEART/CVS: regular rate & rhythm and no murmurs; No lower extremity edema ABDOMEN: abdomen soft, non-tender and normal bowel sounds Musculoskeletal:no cyanosis of digits and no clubbing  PSYCH: alert & oriented x 3 with fluent speech NEURO: no focal motor/sensory deficits SKIN:  no rashes or significant lesions  LABORATORY DATA:  I have reviewed the data as listed Lab Results  Component Value Date   WBC 5.7 04/06/2016   HGB 8.6 (L) 04/06/2016   HCT 25.0 (L) 04/06/2016   MCV 88.0 04/06/2016   PLT 93 (L) 04/06/2016    Recent Labs  03/22/16 0859  03/31/16 1443 04/04/16 1511 04/06/16 0014  NA 129*  < > 131* 131* 138  K 4.5  < > 4.6 4.3 4.1  CL 99*  < > 104 105 113*  CO2 25  < > 24 23 22   GLUCOSE 128*  < > 93 131* 141*  BUN 32*  < > 21* 24* 42*  CREATININE 0.90  < > 1.20 1.06 1.15  CALCIUM 9.2  < > 8.1* 8.4* 8.4*  GFRNONAA >60  < > 59* >60 >60  GFRAA >60  < > >60 >60 >60  PROT 8.1  --   --   --  7.8  ALBUMIN 3.2*  --   --   --  2.6*  AST 35  --   --   --  29  ALT 38  --   --   --  23  ALKPHOS 87  --   --   --  78  BILITOT 0.7   --   --   --  0.6  < > = values in this interval not displayed.  RADIOGRAPHIC STUDIES: I have personally reviewed the radiological images as listed and agreed with the findings in the report. Ct Chest W Contrast  Result Date: 03/15/2016 CLINICAL DATA:  Abnormal chest x-ray.  Terrible back pain. EXAM: CT CHEST WITH CONTRAST TECHNIQUE: Multidetector CT imaging of the chest was performed during intravenous contrast administration. CONTRAST:  71m ISOVUE-300 IOPAMIDOL (ISOVUE-300) INJECTION 61% COMPARISON:  Two-view chest x-ray 03/07/2016 FINDINGS: Cardiovascular: The heart size is normal. No significant coronary artery calcifications are present. There is no significant pericardial effusion. Mediastinum/Nodes: No significant mediastinal or axillary adenopathy is present. Atherosclerotic calcifications are present at the aortic arch. Lungs/Pleura: Biapical pleural parenchymal scarring is present. There is upward traction on the hila bilaterally. Emphysematous changes are noted. No focal nodule, mass, or airspace disease is evident. Upper Abdomen: Multiple hypodense lesions are present within the liver. The largest lesion is in segment 4 A measuring 14 mm. This may be a cyst. There are other more subtle hypodense lesions. Most worrisome is a far right lateral lesion in segment 7 on image 62 of series 2. This measures 15 mm. The visualized abdominal organs are otherwise unremarkable. Musculoskeletal: A large soft tissue lesion is centered along the posterior left ninth rib measuring 5.7 x 3.3 x 4.5 cm. There is no significant invasion of the spinal canal although the lesion does extend to the left T8-9 foramen. There is tumor invasion and the posterior elements of T9 on the left. An additional more lateral left T9 lytic lesion and pathologic fracture is noted. Focal irregularity of the posterior left T12 rib the likely represents a metastatic lesion as well. The pathologic fractures present at T10 with extraosseous  tumor extending into the spinal canal at T10 and to the right. There is tumor in both feet T9-10 and T10-11 foramen on the right. Marked osteopenia is present. There is lytic tumor involving the posterior elements on the right at T3 with probable involvement of the right T3-4 foramen. There is also a lytic lesion anteriorly within the T3 vertebral body without collapse. A far right T4 lesion measures 14 mm without collapse. In anterior lesion in T6 measures 14 mm. A 9 mm lesion is present within the right posterior element of T9. Infiltrative tumor is suspected posteriorly at T11 without collapse or extraosseous extension of tumor. Multiple other smaller spinous lesions are suspected. MRI of the thoracic spine without and with contrast would be useful for further evaluation. IMPRESSION: 1. Multiple lytic lesions throughout the thoracic spine and ribs as described suggesting metastatic disease. 2. The lesion evident on the chest x-ray corresponds with a 5.7 x 3.3 x 4.5 cm mass infiltrating the left T9 rib with extension to the left T8-9 foramen but no invasion of the foramen. 3. 4.1 x 3.1 cm exophytic mass lesion at T10 with pathologic fracture. The tumor extends into the spinal canal with moderate central canal stenosis and right greater than left foraminal narrowing at T9-10 and T10-11. 4. Prominent infiltrative tumor posteriorly at T11 without compression fracture. 5. Additional pathologic fracture within the more distal left ninth rib. 6. Marked osteopenia with probable hemangioma is an high suspicion for multiple additional lesions throughout the thoracic spine. Recommend MRI of the thoracic spine without and with contrast for further evaluation 7. Multiple hypodense lesions within the liver also raise concern for metastatic disease. Dedicated CT of the abdomen and pelvis with contrast is recommended for further evaluation of metastatic disease and  possible primary source. 8. Emphysema and biapical scarring in  the lungs without evidence for primary tumor. These results were called by telephone at the time of interpretation on 03/15/2016 at 11:22 am to [facility], [place], who verbally acknowledged these results. Electronically Signed   By: San Morelle M.D.   On: 03/15/2016 11:44   Nm Pet Image Initial (pi) Whole Body  Result Date: 03/29/2016 CLINICAL DATA:  Initial treatment strategy for multiple myeloma. PET-CT was attempted on 03/22/2016, however the PET portion of the study could not be completed due to patient discomfort. Status post radiation therapy and chemotherapy 1 day prior with steroids. EXAM: NUCLEAR MEDICINE PET WHOLE BODY TECHNIQUE: 12.8 mCi F-18 FDG was injected intravenously. Full-ring PET imaging was performed from the vertex to the feet after the radiotracer. CT data was obtained and used for attenuation correction and anatomic localization. FASTING BLOOD GLUCOSE:  Value:  119 mg/dl COMPARISON:  03/22/2016 incomplete PET-CT and 03/15/2016 chest CT. FINDINGS: Head/Neck: No hypermetabolic lymph nodes in the neck. Chest: No hypermetabolic axillary, mediastinal or hilar nodes. Top-normal heart size. Atherosclerotic nonaneurysmal thoracic aorta. No pleural effusions. There are 4 mm pulmonary nodules in the anterior right upper lobe (series 3/image 96) and right middle lobe (series 3/image 147), below PET resolution. No acute consolidative airspace disease. Abdomen/Pelvis: No abnormal hypermetabolic activity within the liver, pancreas, adrenal glands, or spleen. No hypermetabolic lymph nodes in the abdomen or pelvis. There are 2 simple right liver cysts, largest 1.4 cm. Atherosclerotic nonaneurysmal abdominal aorta. Skeleton: There are numerous lytic bone lesions with associated mild hypermetabolism throughout the spine, ribs and pelvis, several of which are expansile liver extraosseous soft tissue components. For example an expansile 5.1 x 3.1 cm posterior left ninth rib lytic bone lesion with max SUV  3.1 (series 3/ image 130), a right T10 expansile 4.3 x 3.3 cm lytic bone lesion with max SUV 2.9 and associated pathologic T10 compression fracture, and a right upper sacral 2.7 x 2.0 cm mildly expansile lytic bone lesion (series 3/ image 217) with adjacent pathologic vertical right sacral ala fracture with max SUV 3.0. Extremities: No hypermetabolic activity to suggest metastasis. FDG uptake in the left antecubital fossa/left forearm is injection related. IMPRESSION: 1. Mildly hypermetabolic lytic bone lesions throughout the axial skeleton, several of which are expansile with extra-osseous soft tissue components, consistent with multiple myeloma. Pathologic fractures in the T10 vertebral body and right sacral ala. 2. Two 4 mm right pulmonary nodules, below PET resolution. A follow-up unenhanced chest CT could be obtained in 3-6 months. 3. Aortic atherosclerosis. Electronically Signed   By: Ilona Sorrel M.D.   On: 03/29/2016 10:25   Nm Pet Image Initial (pi) Whole Body  Result Date: 03/22/2016 CLINICAL DATA:  Subsequent treatment strategy for multiple myeloma EXAM: NUCLEAR MEDICINE PET WHOLE BODY TECHNIQUE: 14.2 mCi F-18 FDG was injected intravenously. CT data was obtained and used for attenuation correction and anatomic localization only. (This was not acquired as a diagnostic CT examination.) Additional exam technical data entered on technologist worksheet. FASTING BLOOD GLUCOSE:  Value: 105 mg/dl COMPARISON:  Chest CT 03/15/2016. FINDINGS: Comment: Per report from the technologist, the patient was in considerable pain during the examination. Despite efforts to maintain an acceptable level of comfort for the patient, the patient was unable to tolerate the entire examination. CT images were obtained, but PET images were discontinued after acquisition through only the head. PET images through the neck, chest, abdomen, pelvis and lower extremities were not obtained. Accordingly, the examination  will be  interpreted as a noncontrast CT of the head, neck, chest, abdomen, pelvis, skeleton and extremities. Head/Neck: No definite intracranial mass. No hydrocephalus. No aggressive appearing osseous lesions are noted in the skull. No definite cervical lymphadenopathy noted on the noncontrast CT images. Chest: Heart size is normal. There is no significant pericardial fluid, thickening or pericardial calcification. Aortic atherosclerosis, without definite aneurysm. No definite lymphadenopathy noted in the mediastinal or hilar nodal stations. Please note that accurate exclusion of hilar adenopathy is limited on noncontrast CT scans. Esophagus is unremarkable in appearance. No axillary lymphadenopathy. Mass effect upon the left lower lobe (discussed below) related to expansile left ninth rib lesion. No suspicious appearing pulmonary nodules or masses. Mild bilateral apical nodular pleuroparenchymal thickening, most compatible with chronic post infectious or inflammatory scarring. No acute consolidative airspace disease. No pleural effusions. Abdomen/Pelvis: Several low-attenuation lesions are again noted in the liver, largest of which is in the central aspect of segment 8 measuring 1.4 x 0.9 cm. These are incompletely characterized on today's noncontrast CT examination, but are favored to represent tiny cysts. Gallbladder is poorly visualized and may be decompressed or surgically absent (no surgical clips are noted in the gallbladder fossa). Unenhanced appearance of the pancreas, spleen, bilateral adrenal glands and bilateral kidneys is unremarkable. No hydroureteronephrosis. Unenhanced appearance of the urinary bladder is normal. No pathologic dilatation of small bowel or colon. Skeleton/Extremities: Again noted are expansile lytic lesions involving the left ninth rib and T10 vertebral body where there is a previously recognized pathologic compression fracture. The lesion of the left ninth rib currently measures  approximately 3.2 x 6.1 cm (image 130 of series 3) which is similar to the recent chest CT. Lesion in the right side of the T10 vertebral body extends into the adjacent paraspinal soft tissues and currently measures 3.4 x 3.5 cm, also similar to the prior examination. Large lytic lesion in the anterior aspect of the L4 vertebral body measuring 3.2 x 3.1 cm. Large lytic lesion in the right side of the sacrum just medial to the sacroiliac joint measuring 1.9 x 2.3 cm. Lytic lesion in the right ilium measuring 2.9 x 1.4 cm. Several other smaller lytic lesions are also noted. IMPRESSION: 1. Widespread lytic lesions throughout the visualized axial and appendicular skeleton, compatible with the reported clinical history of multiple myeloma. This is most notable for the lytic lesion with pathologic compression fracture in the T10 vertebral body. No definite extra-skeletal involvement noted on today's examination, which was limited by lack of acquisition of diagnostic PET images secondary to patient pain during the examination. 2. Aortic atherosclerosis. 3. Additional incidental findings, as above. Electronically Signed   By: Vinnie Langton M.D.   On: 03/22/2016 14:41   Ct Biopsy  Result Date: 03/25/2016 INDICATION: Multiple bony lytic lesions.  Possible multiple myeloma. EXAM: CT BIOPSY MEDICATIONS: None. ANESTHESIA/SEDATION: Moderate (conscious) sedation was employed during this procedure. A total of Versed 1.0 mg and Fentanyl 50 mcg was administered intravenously. Moderate Sedation Time: 18 minutes. The patient's level of consciousness and vital signs were monitored continuously by radiology nursing throughout the procedure under my direct supervision. COMPLICATIONS: None immediate. PROCEDURE: Informed written consent was obtained from the patient after a thorough discussion of the procedural risks, benefits and alternatives. All questions were addressed. Maximal Sterile Barrier Technique was utilized including  mask, sterile gloves, sterile drape, hand hygiene and skin antiseptic. A timeout was performed prior to the initiation of the procedure. The patient was placed prone on the CT scanner.  Posterior flank region was prepped and draped using sterile technique. Local anesthetic was applied. Under CT guidance, 22 gauge spinal needle is directed toward the posterior margin of the right iliac bone. Lidocaine was injected subperiosteally. Needle was removed. Then, also under CT guidance, trocar was directed through posterior cortex of right iliac bone. Bone marrow aspirations were obtained with and without heparin, and given to the pathology technologist who was present at that time. Then, bone marrow core biopsy was obtained, and this sample was also given to the pathology technologist. Needle was removed and appropriate dressing was applied. IMPRESSION: Under CT guidance, percutaneous bone marrow aspiration and core biopsy of right iliac bone was performed. Electronically Signed   By: Marijo Conception, M.D.   On: 03/25/2016 10:14   Dg Chest Portable 1 View  Result Date: 04/06/2016 CLINICAL DATA:  Cough, dyspnea. History of multiple myeloma, T10 pathologic fracture. EXAM: PORTABLE CHEST 1 VIEW COMPARISON:  PET-CT March 29, 2016 FINDINGS: Cardiac silhouette is moderately enlarged, mediastinal silhouette is nonsuspicious, mildly calcified aortic knob. Increased lung volumes. No pleural effusion or focal consolidation. No pneumothorax. Apical pleural thickening. Osteopenia. Cachexia. Bold severe T10 compression fracture. IMPRESSION: Moderate cardiomegaly.  Increased lung volumes. Osteopenia with severe T10 fracture corresponding known pathologic fracture. Electronically Signed   By: Elon Alas M.D.   On: 04/06/2016 03:07   Dg Bone Survey Met  Result Date: 03/16/2016 CLINICAL DATA:  Multiple myeloma EXAM: METASTATIC BONE SURVEY COMPARISON:  03/15/2016 FINDINGS: Lateral view of the skull shows no lytic or blastic  lesion. Lateral view of the cervical spine shows diffuse osteopenia. No lytic or blastic lesions. Mild disc space flattening at C3-C4-C4-C5 and C5-C6 level. Frontal view bilateral shoulder shows no lytic or blastic lesion. Frontal view of the neck shows no lytic or blastic lesion. Bilateral humerus demonstrates no lytic or blastic lesion. Bilateral forearm shows no lytic or blastic lesions. There is dextroscoliosis lower thoracic spine. Mild levoscoliosis lumbar spine. Significant compression fracture of T10 vertebral body suspicious for pathologic fracture. Expansile lesion of the left eighth rib suspicious for metastatic disease. Frontal view of the pelvis shows no lytic or blastic lesions. Frontal view bilateral hip and proximal femur is unremarkable. Frontal view bilateral femur shows no lytic or blastic lesions. Bilateral tibia and fibula shows no lytic or blastic lesions. IMPRESSION: 1. There is probable pathologic fracture of T10 vertebral body suspicious for bony lesion. Expansile lesion of left posterior eighth rib suspicious for metastatic disease. Please see patient's CT scan of the chest from yesterday. Electronically Signed   By: Lahoma Crocker M.D.   On: 03/16/2016 17:00    ASSESSMENT & PLAN:   # 70 year old male patient with newly diagnosed multiple myeloma currently admitted to the hospital for intractable nausea and vomiting/ poor by mouth intake.  # Intractable nausea vomiting- with poor by mouth intake. Patient's symptoms are out of proportion to the expected side effects from Velcade/or radiation. Patient has been on high-dose of dexamethasone 40 mg weekly- which could potentially be a cause of his gastritis. Patient is on Pepcid twice a day IV. Continue Zofran /Compazine as needed. Also recommend GI evaluation.  # Hicupps/secondary to gastritis- recommend Thorazine 10 mg every 6 hours.  # Hoarseness of voice/for the last 4-6 weeks on improving- PET scan did not show any significant  abnormality in the neck. Recommend ENT evaluation.  # Multiple myeloma/anemia mild thrombocytopenia- newly diagnosed on Velcade and dexamethasone; hold cycle #1 day 11 Velcade tomorrow. Continue acyclovir.  #  Pain from malignancy- continue hydrocodone as needed.  # Above plan of care was discussed with the patient and family in detail.  # Also discussed with Dr.Konidena.    Thank you Dr. Vianne Bulls for allowing me to participate in the care of your pleasant patient. Please do not hesitate to contact me with questions or concerns in the interim.     Cammie Sickle, MD 04/06/2016 5:27 PM

## 2016-04-07 ENCOUNTER — Inpatient Hospital Stay: Payer: Medicare Other | Admitting: Internal Medicine

## 2016-04-07 ENCOUNTER — Inpatient Hospital Stay: Payer: Medicare Other

## 2016-04-07 ENCOUNTER — Ambulatory Visit: Payer: Medicare Other

## 2016-04-07 DIAGNOSIS — R630 Anorexia: Secondary | ICD-10-CM | POA: Diagnosis not present

## 2016-04-07 DIAGNOSIS — R112 Nausea with vomiting, unspecified: Secondary | ICD-10-CM | POA: Diagnosis not present

## 2016-04-07 DIAGNOSIS — C9000 Multiple myeloma not having achieved remission: Secondary | ICD-10-CM | POA: Diagnosis not present

## 2016-04-07 DIAGNOSIS — K2900 Acute gastritis without bleeding: Secondary | ICD-10-CM | POA: Diagnosis not present

## 2016-04-07 DIAGNOSIS — D696 Thrombocytopenia, unspecified: Secondary | ICD-10-CM | POA: Diagnosis not present

## 2016-04-07 LAB — CBC
HEMATOCRIT: 23.2 % — AB (ref 40.0–52.0)
Hemoglobin: 8 g/dL — ABNORMAL LOW (ref 13.0–18.0)
MCH: 30.2 pg (ref 26.0–34.0)
MCHC: 34.4 g/dL (ref 32.0–36.0)
MCV: 87.8 fL (ref 80.0–100.0)
PLATELETS: 67 10*3/uL — AB (ref 150–440)
RBC: 2.64 MIL/uL — ABNORMAL LOW (ref 4.40–5.90)
RDW: 15.1 % — AB (ref 11.5–14.5)
WBC: 2.6 10*3/uL — AB (ref 3.8–10.6)

## 2016-04-07 LAB — BASIC METABOLIC PANEL
Anion gap: 2 — ABNORMAL LOW (ref 5–15)
BUN: 22 mg/dL — AB (ref 6–20)
CALCIUM: 7.2 mg/dL — AB (ref 8.9–10.3)
CO2: 20 mmol/L — AB (ref 22–32)
CREATININE: 0.87 mg/dL (ref 0.61–1.24)
Chloride: 117 mmol/L — ABNORMAL HIGH (ref 101–111)
GFR calc Af Amer: >60 mL/min (ref >60)
GLUCOSE: 82 mg/dL (ref 65–99)
Potassium: 3.7 mmol/L (ref 3.5–5.1)
Sodium: 139 mmol/L (ref 135–145)

## 2016-04-07 LAB — HEMOGLOBIN A1C
Hgb A1c MFr Bld: 6.2 % — ABNORMAL HIGH (ref 4.8–5.6)
Mean Plasma Glucose: 131 mg/dL

## 2016-04-07 MED ORDER — HYDROMORPHONE HCL 1 MG/ML IJ SOLN
1.0000 mg | INTRAMUSCULAR | Status: DC | PRN
Start: 2016-04-07 — End: 2016-04-14
  Administered 2016-04-07 – 2016-04-13 (×15): 1 mg via INTRAVENOUS
  Filled 2016-04-07 (×16): qty 1

## 2016-04-07 MED ORDER — ALUM & MAG HYDROXIDE-SIMETH 200-200-20 MG/5ML PO SUSP: 30.0000 mL | ORAL | Status: DC | PRN

## 2016-04-07 MED ORDER — PROCHLORPERAZINE EDISYLATE 5 MG/ML IJ SOLN
10.0000 mg | Freq: Four times a day (QID) | INTRAMUSCULAR | Status: DC | PRN
  Administered 2016-04-07 – 2016-04-08 (×2): 10 mg via INTRAVENOUS
  Filled 2016-04-07 (×4): qty 2

## 2016-04-07 MED ORDER — SODIUM CHLORIDE 0.9 % IV SOLN
8.0000 mg/h | INTRAVENOUS | Status: AC
Start: 2016-04-07 — End: 2016-04-10
  Administered 2016-04-07 – 2016-04-10 (×7): 8 mg/h via INTRAVENOUS
  Filled 2016-04-07 (×8): qty 80

## 2016-04-07 NOTE — Progress Notes (Signed)
[facility] NAME: Francisco [name]    MR#:  [id number]  DATE OF BIRTH:  01-16-1946  SUBJECTIVE: Admitted for intractable nausea, vomiting. Patient started on IV PPI yesterday. Little  better but not much. Continue  hiccups present, poor by mouth intake.   CHIEF COMPLAINT:   Chief Complaint  Patient presents with  . Emesis    REVIEW OF SYSTEMS:    Review of Systems  Constitutional: Positive for weight loss. Negative for chills and fever.  HENT: Negative for hearing loss.   Eyes: Negative for blurred vision, double vision and photophobia.  Respiratory: Negative for cough, hemoptysis and shortness of breath.   Cardiovascular: Negative for palpitations, orthopnea and leg swelling.  Gastrointestinal: Positive for heartburn, nausea and vomiting. Negative for abdominal pain and diarrhea.  Genitourinary: Negative for dysuria and urgency.  Musculoskeletal: Negative for myalgias and neck pain.  Skin: Negative for rash.  Neurological: Positive for weakness. Negative for dizziness, focal weakness, seizures and headaches.  Psychiatric/Behavioral: Negative for memory loss. The patient does not have insomnia.     Nutrition:  Tolerating Diet:no Tolerating PT:      DRUG ALLERGIES:  No Known Allergies  VITALS:  Blood pressure 124/69, pulse (!) 105, temperature 98.4 F (36.9 C), temperature source Oral, resp. rate (!) 24, height 5' 10"  (1.778 m), weight 45.7 kg (100 lb 12.8 oz), SpO2 99 %.  PHYSICAL EXAMINATION:   Physical Exam  GENERAL:  70 y.o.-year-old patient lying in the bed with no acute distress.cachectic. EYES: Pupils equal, round, reactive to light and accommodation. No scleral icterus. Extraocular muscles intact.  HEENT: Head atraumatic, normocephalic. Oropharynx and nasopharynx clear.  NECK:  Supple, no jugular venous distention. No thyroid enlargement, no tenderness.  LUNGS: Normal breath sounds bilaterally, no  wheezing, rales,rhonchi or crepitation. No use of accessory muscles of respiration.  CARDIOVASCULAR: S1, S2 normal. No murmurs, rubs, or gallops.  ABDOMEN: Soft, nontender, nondistended. Bowel sounds present. No organomegaly or mass.  EXTREMITIES: No pedal edema, cyanosis, or clubbing.  NEUROLOGIC: Cranial nerves II through XII are intact. Muscle strength 5/5 in all extremities. Sensation intact. Gait not checked.  PSYCHIATRIC: The patient is alert and oriented x 3.  SKIN: No obvious rash, lesion, or ulcer.    LABORATORY PANEL:   CBC  Recent Labs Lab 04/07/16 0457  WBC 2.6*  HGB 8.0*  HCT 23.2*  PLT 67*   ------------------------------------------------------------------------------------------------------------------  Chemistries   Recent Labs Lab 04/06/16 0014 04/07/16 0457  NA 138 139  K 4.1 3.7  CL 113* 117*  CO2 22 20*  GLUCOSE 141* 82  BUN 42* 22*  CREATININE 1.15 0.87  CALCIUM 8.4* 7.2*  MG 2.2  --   AST 29  --   ALT 23  --   ALKPHOS 78  --   BILITOT 0.6  --    ------------------------------------------------------------------------------------------------------------------  Cardiac Enzymes No results for input(s): TROPONINI in the last 168 hours. ------------------------------------------------------------------------------------------------------------------  RADIOLOGY:  Dg Chest Portable 1 View  Result Date: 04/06/2016 CLINICAL DATA:  Cough, dyspnea. History of multiple myeloma, T10 pathologic fracture. EXAM: PORTABLE CHEST 1 VIEW COMPARISON:  PET-CT March 29, 2016 FINDINGS: Cardiac silhouette is moderately enlarged, mediastinal silhouette is nonsuspicious, mildly calcified aortic knob. Increased lung volumes. No pleural effusion or focal consolidation. No pneumothorax. Apical pleural thickening. Osteopenia. Cachexia. Bold severe T10 compression fracture. IMPRESSION: Moderate cardiomegaly.  Increased lung volumes. Osteopenia with severe T10 fracture  corresponding known pathologic fracture. Electronically Signed  By: Elon Alas M.D.   On: 04/06/2016 03:07     ASSESSMENT AND PLAN:   Active Problems:   Intractable nausea and vomiting   1. intractable nausea and vomiting likely secondary to acute gastritis from steroid use. Protonix drip, IV Zofran, IV Phenergan., IV hydration #2 multiple myeloma with chronic bone pains: Continue IV pain medicines. #3 severe malnutrition in the context of chronic illness: Continue Magic cups, Marinol line  #4. 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   All the records are reviewed and case discussed with Care Management/Social Workerr. Management plans discussed with the patient, family and they are in agreement.  CODE STATUS: full  TOTAL TIME TAKING CARE OF THIS PATIENT: 35 minutes.   POSSIBLE D/C IN  1-2 DAYS, DEPENDING ON CLINICAL CONDITION.   [name] M.D on 04/07/2016 at 12:43 PM  Between 7am to 6pm - Pager - [phone]  After 6pm go to [email or web address] - password [email or web address] [facility]  Office  [phone]  CC: Primary care physician; Alfonse Flavors, MD

## 2016-04-07 NOTE — [facility] Notes (Signed)
[place] NOTE  Patient Care Team: Alfonse Flavors, MD as PCP - General (Family Medicine)  CHIEF COMPLAINTS/PURPOSE OF CONSULTATION:  Persistent nausea vomiting/multiple myeloma  CC; nausea vomiting/poor by mouth intake continued- slightly improved. Hicupps slightly improved. Patient threw up Thorazine/still not tolerating pills.  Continues to feel weak. No fever no chills. No bleeding.  MEDICAL HISTORY:  Past Medical History:  Diagnosis Date  . Anemia   . Cancer (HCC)    Bone metastasis  . Constipation   . Hearing loss   . Hoarse voice quality   . Liver lesion   . Malaria 2003  . Multiple myeloma ([place]) 03/22/2016   Per Dr. Rogue Bussing on his PET order.  . Tuberculosis 1985    SURGICAL HISTORY: Past Surgical History:  Procedure Laterality Date  . RETINAL DETACHMENT SURGERY  2006    SOCIAL HISTORY: Lives in [place]. No smoking or alcohol. he teaches yoga. Social History   Social History  . Marital status: Married    Spouse name: N/A  . Number of children: N/A  . Years of education: N/A   Occupational History  . Not on file.   Social History Main Topics  . Smoking status: Never Smoker  . Smokeless tobacco: Never Used  . Alcohol use No  . Drug use: No  . Sexual activity: Not on file   Other Topics Concern  . Not on file   Social History Narrative  . No narrative on file    FAMILY HISTORY: History reviewed. No pertinent family history.  ALLERGIES:  has No Known Allergies.  MEDICATIONS:  Current Facility-Administered Medications  Medication Dose Route Frequency Provider Last Rate Last Dose  . 0.9 %  sodium chloride infusion   Intravenous Continuous [name], MD 50 mL/hr at 04/07/16 1643    . acetaminophen (TYLENOL) tablet 650 mg  650 mg Oral Q6H PRN Harrie Foreman, MD       Or  . acetaminophen (TYLENOL) suppository 650 mg  650 mg Rectal Q6H PRN Harrie Foreman, MD      . acyclovir (ZOVIRAX) 200 MG capsule  400 mg  400 mg Oral BID Harrie Foreman, MD      . alum & mag hydroxide-simeth (MAALOX/MYLANTA) 200-200-20 MG/5ML suspension 30 mL  30 mL Oral Q4H PRN Cammie Sickle, MD      . docusate sodium (COLACE) capsule 100 mg  100 mg Oral BID Harrie Foreman, MD      . dronabinol (MARINOL) capsule 2.5 mg  2.5 mg Oral BID AC Harrie Foreman, MD   2.5 mg at 04/06/16 1701  . HYDROmorphone (DILAUDID) injection 1 mg  1 mg Intravenous Q4H PRN [name], MD   1 mg at 04/07/16 1406  . magnesium hydroxide (MILK OF MAGNESIA) suspension 5 mL  5 mL Oral Daily PRN Harrie Foreman, MD      . MEDLINE mouth rinse  15 mL Mouth Rinse BID [name], MD   15 mL at 04/07/16 1205  . metoCLOPramide (REGLAN) injection 5 mg  5 mg Intravenous Q8H PRN [name], MD   5 mg at 04/06/16 1701  . ondansetron (ZOFRAN) tablet 4 mg  4 mg Oral Q6H PRN Harrie Foreman, MD       Or  . ondansetron [facility]) injection 4 mg  4 mg Intravenous Q6H PRN Harrie Foreman, MD   4 mg at 04/06/16 0920  . pantoprazole (PROTONIX) 80 mg in sodium chloride 0.9 %  250 mL (0.32 mg/mL) infusion  8 mg/hr Intravenous Continuous [name], MD 25 mL/hr at 04/07/16 1203 8 mg/hr at 04/07/16 1203  . prochlorperazine (COMPAZINE) injection 10 mg  10 mg Intravenous Q6H PRN [name], MD   10 mg at 04/07/16 0926  . sucralfate (CARAFATE) tablet 1 g  1 g Oral TID Cammie Sickle, MD   1 g at 04/06/16 2317      .  PHYSICAL EXAMINATION:  Vitals:   04/07/16 0525 04/07/16 1349  BP: 124/69 129/79  Pulse: (!) 105 98  Resp:  18  Temp: 98.4 F (36.9 C) 98.3 F (36.8 C)   Filed Weights   04/05/16 2300 04/06/16 0511 04/07/16 0500  Weight: 108 lb (49 kg) 103 lb 14.4 oz (47.1 kg) 100 lb 12.8 oz (45.7 kg)    GENERAL: Cachectic appearing; mild distress because of nausea and vomiting. Accompanied by his daughter and/family EYES: no pallor or icterus OROPHARYNX: no thrush or ulceration. NECK: supple,  no masses felt LYMPH:  no palpable lymphadenopathy in the cervical, axillary or inguinal regions LUNGS: decreased breath sounds to auscultation at bases and  No wheeze or crackles HEART/CVS: regular rate & rhythm and no murmurs; No lower extremity edema ABDOMEN: abdomen soft, non-tender and normal bowel sounds Musculoskeletal:no cyanosis of digits and no clubbing  PSYCH: alert & oriented x 3 with fluent speech NEURO: no focal motor/sensory deficits SKIN:  no rashes or significant lesions  LABORATORY DATA:  I have reviewed the data as listed Lab Results  Component Value Date   WBC 2.6 (L) 04/07/2016   HGB 8.0 (L) 04/07/2016   HCT 23.2 (L) 04/07/2016   MCV 87.8 04/07/2016   PLT 67 (L) 04/07/2016    Recent Labs  03/22/16 0859  04/04/16 1511 04/06/16 0014 04/07/16 0457  NA 129*  < > 131* 138 139  K 4.5  < > 4.3 4.1 3.7  CL 99*  < > 105 113* 117*  CO2 25  < > 23 22 20*  GLUCOSE 128*  < > 131* 141* 82  BUN 32*  < > 24* 42* 22*  CREATININE 0.90  < > 1.06 1.15 0.87  CALCIUM 9.2  < > 8.4* 8.4* 7.2*  GFRNONAA >60  < > >60 >60 >60  GFRAA >60  < > >60 >60 >60  PROT 8.1  --   --  7.8  --   ALBUMIN 3.2*  --   --  2.6*  --   AST 35  --   --  29  --   ALT 38  --   --  23  --   ALKPHOS 87  --   --  78  --   BILITOT 0.7  --   --  0.6  --   < > = values in this interval not displayed.  RADIOGRAPHIC STUDIES: I have personally reviewed the radiological images as listed and agreed with the findings in the report. Ct Chest W Contrast  Result Date: 03/15/2016 CLINICAL DATA:  Abnormal chest x-ray.  Terrible back pain. EXAM: CT CHEST WITH CONTRAST TECHNIQUE: Multidetector CT imaging of the chest was performed during intravenous contrast administration. CONTRAST:  46m ISOVUE-300 IOPAMIDOL (ISOVUE-300) INJECTION 61% COMPARISON:  Two-view chest x-ray 03/07/2016 FINDINGS: Cardiovascular: The heart size is normal. No significant coronary artery calcifications are present. There is no significant  pericardial effusion. Mediastinum/Nodes: No significant mediastinal or axillary adenopathy is present. Atherosclerotic calcifications are present at the aortic arch. Lungs/Pleura: Biapical pleural parenchymal scarring  is present. There is upward traction on the hila bilaterally. Emphysematous changes are noted. No focal nodule, mass, or airspace disease is evident. Upper Abdomen: Multiple hypodense lesions are present within the liver. The largest lesion is in segment 4 A measuring 14 mm. This may be a cyst. There are other more subtle hypodense lesions. Most worrisome is a far right lateral lesion in segment 7 on image 62 of series 2. This measures 15 mm. The visualized abdominal organs are otherwise unremarkable. Musculoskeletal: A large soft tissue lesion is centered along the posterior left ninth rib measuring 5.7 x 3.3 x 4.5 cm. There is no significant invasion of the spinal canal although the lesion does extend to the left T8-9 foramen. There is tumor invasion and the posterior elements of T9 on the left. An additional more lateral left T9 lytic lesion and pathologic fracture is noted. Focal irregularity of the posterior left T12 rib the likely represents a metastatic lesion as well. The pathologic fractures present at T10 with extraosseous tumor extending into the spinal canal at T10 and to the right. There is tumor in both feet T9-10 and T10-11 foramen on the right. Marked osteopenia is present. There is lytic tumor involving the posterior elements on the right at T3 with probable involvement of the right T3-4 foramen. There is also a lytic lesion anteriorly within the T3 vertebral body without collapse. A far right T4 lesion measures 14 mm without collapse. In anterior lesion in T6 measures 14 mm. A 9 mm lesion is present within the right posterior element of T9. Infiltrative tumor is suspected posteriorly at T11 without collapse or extraosseous extension of tumor. Multiple other smaller spinous lesions are  suspected. MRI of the thoracic spine without and with contrast would be useful for further evaluation. IMPRESSION: 1. Multiple lytic lesions throughout the thoracic spine and ribs as described suggesting metastatic disease. 2. The lesion evident on the chest x-ray corresponds with a 5.7 x 3.3 x 4.5 cm mass infiltrating the left T9 rib with extension to the left T8-9 foramen but no invasion of the foramen. 3. 4.1 x 3.1 cm exophytic mass lesion at T10 with pathologic fracture. The tumor extends into the spinal canal with moderate central canal stenosis and right greater than left foraminal narrowing at T9-10 and T10-11. 4. Prominent infiltrative tumor posteriorly at T11 without compression fracture. 5. Additional pathologic fracture within the more distal left ninth rib. 6. Marked osteopenia with probable hemangioma is an high suspicion for multiple additional lesions throughout the thoracic spine. Recommend MRI of the thoracic spine without and with contrast for further evaluation 7. Multiple hypodense lesions within the liver also raise concern for metastatic disease. Dedicated CT of the abdomen and pelvis with contrast is recommended for further evaluation of metastatic disease and possible primary source. 8. Emphysema and biapical scarring in the lungs without evidence for primary tumor. These results were called by telephone at the time of interpretation on 03/15/2016 at 11:22 am to [facility], [place], who verbally acknowledged these results. Electronically Signed   By: San Morelle M.D.   On: 03/15/2016 11:44   Nm Pet Image Initial (pi) Whole Body  Result Date: 03/29/2016 CLINICAL DATA:  Initial treatment strategy for multiple myeloma. PET-CT was attempted on 03/22/2016, however the PET portion of the study could not be completed due to patient discomfort. Status post radiation therapy and chemotherapy 1 day prior with steroids. EXAM: NUCLEAR MEDICINE PET WHOLE BODY TECHNIQUE: 12.8 mCi F-18 FDG was  injected intravenously.  Full-ring PET imaging was performed from the vertex to the feet after the radiotracer. CT data was obtained and used for attenuation correction and anatomic localization. FASTING BLOOD GLUCOSE:  Value:  119 mg/dl COMPARISON:  03/22/2016 incomplete PET-CT and 03/15/2016 chest CT. FINDINGS: Head/Neck: No hypermetabolic lymph nodes in the neck. Chest: No hypermetabolic axillary, mediastinal or hilar nodes. Top-normal heart size. Atherosclerotic nonaneurysmal thoracic aorta. No pleural effusions. There are 4 mm pulmonary nodules in the anterior right upper lobe (series 3/image 96) and right middle lobe (series 3/image 147), below PET resolution. No acute consolidative airspace disease. Abdomen/Pelvis: No abnormal hypermetabolic activity within the liver, pancreas, adrenal glands, or spleen. No hypermetabolic lymph nodes in the abdomen or pelvis. There are 2 simple right liver cysts, largest 1.4 cm. Atherosclerotic nonaneurysmal abdominal aorta. Skeleton: There are numerous lytic bone lesions with associated mild hypermetabolism throughout the spine, ribs and pelvis, several of which are expansile liver extraosseous soft tissue components. For example an expansile 5.1 x 3.1 cm posterior left ninth rib lytic bone lesion with max SUV 3.1 (series 3/ image 130), a right T10 expansile 4.3 x 3.3 cm lytic bone lesion with max SUV 2.9 and associated pathologic T10 compression fracture, and a right upper sacral 2.7 x 2.0 cm mildly expansile lytic bone lesion (series 3/ image 217) with adjacent pathologic vertical right sacral ala fracture with max SUV 3.0. Extremities: No hypermetabolic activity to suggest metastasis. FDG uptake in the left antecubital fossa/left forearm is injection related. IMPRESSION: 1. Mildly hypermetabolic lytic bone lesions throughout the axial skeleton, several of which are expansile with extra-osseous soft tissue components, consistent with multiple myeloma. Pathologic fractures  in the T10 vertebral body and right sacral ala. 2. Two 4 mm right pulmonary nodules, below PET resolution. A follow-up unenhanced chest CT could be obtained in 3-6 months. 3. Aortic atherosclerosis. Electronically Signed   By: Ilona Sorrel M.D.   On: 03/29/2016 10:25   Nm Pet Image Initial (pi) Whole Body  Result Date: 03/22/2016 CLINICAL DATA:  Subsequent treatment strategy for multiple myeloma EXAM: NUCLEAR MEDICINE PET WHOLE BODY TECHNIQUE: 14.2 mCi F-18 FDG was injected intravenously. CT data was obtained and used for attenuation correction and anatomic localization only. (This was not acquired as a diagnostic CT examination.) Additional exam technical data entered on technologist worksheet. FASTING BLOOD GLUCOSE:  Value: 105 mg/dl COMPARISON:  Chest CT 03/15/2016. FINDINGS: Comment: Per report from the technologist, the patient was in considerable pain during the examination. Despite efforts to maintain an acceptable level of comfort for the patient, the patient was unable to tolerate the entire examination. CT images were obtained, but PET images were discontinued after acquisition through only the head. PET images through the neck, chest, abdomen, pelvis and lower extremities were not obtained. Accordingly, the examination will be interpreted as a noncontrast CT of the head, neck, chest, abdomen, pelvis, skeleton and extremities. Head/Neck: No definite intracranial mass. No hydrocephalus. No aggressive appearing osseous lesions are noted in the skull. No definite cervical lymphadenopathy noted on the noncontrast CT images. Chest: Heart size is normal. There is no significant pericardial fluid, thickening or pericardial calcification. Aortic atherosclerosis, without definite aneurysm. No definite lymphadenopathy noted in the mediastinal or hilar nodal stations. Please note that accurate exclusion of hilar adenopathy is limited on noncontrast CT scans. Esophagus is unremarkable in appearance. No axillary  lymphadenopathy. Mass effect upon the left lower lobe (discussed below) related to expansile left ninth rib lesion. No suspicious appearing pulmonary nodules or masses. Mild bilateral  apical nodular pleuroparenchymal thickening, most compatible with chronic post infectious or inflammatory scarring. No acute consolidative airspace disease. No pleural effusions. Abdomen/Pelvis: Several low-attenuation lesions are again noted in the liver, largest of which is in the central aspect of segment 8 measuring 1.4 x 0.9 cm. These are incompletely characterized on today's noncontrast CT examination, but are favored to represent tiny cysts. Gallbladder is poorly visualized and may be decompressed or surgically absent (no surgical clips are noted in the gallbladder fossa). Unenhanced appearance of the pancreas, spleen, bilateral adrenal glands and bilateral kidneys is unremarkable. No hydroureteronephrosis. Unenhanced appearance of the urinary bladder is normal. No pathologic dilatation of small bowel or colon. Skeleton/Extremities: Again noted are expansile lytic lesions involving the left ninth rib and T10 vertebral body where there is a previously recognized pathologic compression fracture. The lesion of the left ninth rib currently measures approximately 3.2 x 6.1 cm (image 130 of series 3) which is similar to the recent chest CT. Lesion in the right side of the T10 vertebral body extends into the adjacent paraspinal soft tissues and currently measures 3.4 x 3.5 cm, also similar to the prior examination. Large lytic lesion in the anterior aspect of the L4 vertebral body measuring 3.2 x 3.1 cm. Large lytic lesion in the right side of the sacrum just medial to the sacroiliac joint measuring 1.9 x 2.3 cm. Lytic lesion in the right ilium measuring 2.9 x 1.4 cm. Several other smaller lytic lesions are also noted. IMPRESSION: 1. Widespread lytic lesions throughout the visualized axial and appendicular skeleton, compatible with the  reported clinical history of multiple myeloma. This is most notable for the lytic lesion with pathologic compression fracture in the T10 vertebral body. No definite extra-skeletal involvement noted on today's examination, which was limited by lack of acquisition of diagnostic PET images secondary to patient pain during the examination. 2. Aortic atherosclerosis. 3. Additional incidental findings, as above. Electronically Signed   By: Vinnie Langton M.D.   On: 03/22/2016 14:41   Ct Biopsy  Result Date: 03/25/2016 INDICATION: Multiple bony lytic lesions.  Possible multiple myeloma. EXAM: CT BIOPSY MEDICATIONS: None. ANESTHESIA/SEDATION: Moderate (conscious) sedation was employed during this procedure. A total of Versed 1.0 mg and Fentanyl 50 mcg was administered intravenously. Moderate Sedation Time: 18 minutes. The patient's level of consciousness and vital signs were monitored continuously by radiology nursing throughout the procedure under my direct supervision. COMPLICATIONS: None immediate. PROCEDURE: Informed written consent was obtained from the patient after a thorough discussion of the procedural risks, benefits and alternatives. All questions were addressed. Maximal Sterile Barrier Technique was utilized including mask, sterile gloves, sterile drape, hand hygiene and skin antiseptic. A timeout was performed prior to the initiation of the procedure. The patient was placed prone on the CT scanner. Posterior flank region was prepped and draped using sterile technique. Local anesthetic was applied. Under CT guidance, 22 gauge spinal needle is directed toward the posterior margin of the right iliac bone. Lidocaine was injected subperiosteally. Needle was removed. Then, also under CT guidance, trocar was directed through posterior cortex of right iliac bone. Bone marrow aspirations were obtained with and without heparin, and given to the pathology technologist who was present at that time. Then, bone marrow  core biopsy was obtained, and this sample was also given to the pathology technologist. Needle was removed and appropriate dressing was applied. IMPRESSION: Under CT guidance, percutaneous bone marrow aspiration and core biopsy of right iliac bone was performed. Electronically Signed   By: Jeneen Rinks  Murlean Caller, M.D.   On: 03/25/2016 10:14   Dg Chest Portable 1 View  Result Date: 04/06/2016 CLINICAL DATA:  Cough, dyspnea. History of multiple myeloma, T10 pathologic fracture. EXAM: PORTABLE CHEST 1 VIEW COMPARISON:  PET-CT March 29, 2016 FINDINGS: Cardiac silhouette is moderately enlarged, mediastinal silhouette is nonsuspicious, mildly calcified aortic knob. Increased lung volumes. No pleural effusion or focal consolidation. No pneumothorax. Apical pleural thickening. Osteopenia. Cachexia. Bold severe T10 compression fracture. IMPRESSION: Moderate cardiomegaly.  Increased lung volumes. Osteopenia with severe T10 fracture corresponding known pathologic fracture. Electronically Signed   By: Elon Alas M.D.   On: 04/06/2016 03:07   Dg Bone Survey Met  Result Date: 03/16/2016 CLINICAL DATA:  Multiple myeloma EXAM: METASTATIC BONE SURVEY COMPARISON:  03/15/2016 FINDINGS: Lateral view of the skull shows no lytic or blastic lesion. Lateral view of the cervical spine shows diffuse osteopenia. No lytic or blastic lesions. Mild disc space flattening at C3-C4-C4-C5 and C5-C6 level. Frontal view bilateral shoulder shows no lytic or blastic lesion. Frontal view of the neck shows no lytic or blastic lesion. Bilateral humerus demonstrates no lytic or blastic lesion. Bilateral forearm shows no lytic or blastic lesions. There is dextroscoliosis lower thoracic spine. Mild levoscoliosis lumbar spine. Significant compression fracture of T10 vertebral body suspicious for pathologic fracture. Expansile lesion of the left eighth rib suspicious for metastatic disease. Frontal view of the pelvis shows no lytic or blastic  lesions. Frontal view bilateral hip and proximal femur is unremarkable. Frontal view bilateral femur shows no lytic or blastic lesions. Bilateral tibia and fibula shows no lytic or blastic lesions. IMPRESSION: 1. There is probable pathologic fracture of T10 vertebral body suspicious for bony lesion. Expansile lesion of left posterior eighth rib suspicious for metastatic disease. Please see patient's CT scan of the chest from yesterday. Electronically Signed   By: Lahoma Crocker M.D.   On: 03/16/2016 17:00    ASSESSMENT & PLAN:   # 70 year old male patient with newly diagnosed multiple myeloma currently admitted to the hospital for intractable nausea and vomiting/ poor by mouth intake.  # Intractable nausea vomiting/Hicupps/likely secondary to gastritis-currently on Pepcid IV twice a day. Recommend continuous IV Protonix drip. Await GI evaluation. Recommend Thorazine 10 mg every 6 hours as needed. Also recommend Maalox Carafate.  # Hoarseness of voice/for the last 4-6 weeks- discussed with Dr. Pryor Ochoa. Plan out patient evaluation/ endoscopy.  # Multiple myeloma/anemia mild thrombocytopenia- newly diagnosed on Velcade and dexamethasone; hold cycle #1 day 11 Velcade today. Continue acyclovir.  # Pain from malignancy- continue hydrocodone as needed.  Discussed with Dr.Konidena.     Cammie Sickle, MD 04/07/2016 5:11 PM

## 2016-04-08 ENCOUNTER — Ambulatory Visit: Payer: Medicare Other

## 2016-04-08 ENCOUNTER — Ambulatory Visit: Payer: Medicaid Other | Admitting: Internal Medicine

## 2016-04-08 DIAGNOSIS — R112 Nausea with vomiting, unspecified: Secondary | ICD-10-CM | POA: Diagnosis present

## 2016-04-08 DIAGNOSIS — K2900 Acute gastritis without bleeding: Secondary | ICD-10-CM | POA: Diagnosis present

## 2016-04-08 DIAGNOSIS — R066 Hiccough: Secondary | ICD-10-CM | POA: Diagnosis not present

## 2016-04-08 DIAGNOSIS — M549 Dorsalgia, unspecified: Secondary | ICD-10-CM | POA: Diagnosis present

## 2016-04-08 DIAGNOSIS — Z681 Body mass index (BMI) 19 or less, adult: Secondary | ICD-10-CM | POA: Diagnosis not present

## 2016-04-08 DIAGNOSIS — E860 Dehydration: Secondary | ICD-10-CM | POA: Diagnosis present

## 2016-04-08 DIAGNOSIS — E876 Hypokalemia: Secondary | ICD-10-CM | POA: Diagnosis present

## 2016-04-08 DIAGNOSIS — R1310 Dysphagia, unspecified: Secondary | ICD-10-CM | POA: Diagnosis present

## 2016-04-08 DIAGNOSIS — T451X5A Adverse effect of antineoplastic and immunosuppressive drugs, initial encounter: Secondary | ICD-10-CM | POA: Diagnosis present

## 2016-04-08 DIAGNOSIS — R490 Dysphonia: Secondary | ICD-10-CM | POA: Diagnosis present

## 2016-04-08 DIAGNOSIS — C9000 Multiple myeloma not having achieved remission: Secondary | ICD-10-CM | POA: Diagnosis present

## 2016-04-08 DIAGNOSIS — G893 Neoplasm related pain (acute) (chronic): Secondary | ICD-10-CM | POA: Diagnosis present

## 2016-04-08 DIAGNOSIS — K5900 Constipation, unspecified: Secondary | ICD-10-CM | POA: Diagnosis present

## 2016-04-08 DIAGNOSIS — R64 Cachexia: Secondary | ICD-10-CM | POA: Diagnosis present

## 2016-04-08 DIAGNOSIS — D696 Thrombocytopenia, unspecified: Secondary | ICD-10-CM | POA: Diagnosis not present

## 2016-04-08 DIAGNOSIS — D6959 Other secondary thrombocytopenia: Secondary | ICD-10-CM | POA: Diagnosis present

## 2016-04-08 DIAGNOSIS — E43 Unspecified severe protein-calorie malnutrition: Secondary | ICD-10-CM | POA: Diagnosis present

## 2016-04-08 DIAGNOSIS — D630 Anemia in neoplastic disease: Secondary | ICD-10-CM | POA: Diagnosis present

## 2016-04-08 DIAGNOSIS — F329 Major depressive disorder, single episode, unspecified: Secondary | ICD-10-CM | POA: Diagnosis present

## 2016-04-08 LAB — CBC WITH DIFFERENTIAL/PLATELET
BASOS ABS: 0 10*3/uL (ref 0–0.1)
Basophils Relative: 0 %
EOS ABS: 0 10*3/uL (ref 0–0.7)
EOS PCT: 0 %
HCT: 23 % — ABNORMAL LOW (ref 40.0–52.0)
Hemoglobin: 7.8 g/dL — ABNORMAL LOW (ref 13.0–18.0)
Lymphocytes Relative: 3 %
Lymphs Abs: 0.1 10*3/uL — ABNORMAL LOW (ref 1.0–3.6)
MCH: 29.8 pg (ref 26.0–34.0)
MCHC: 34 g/dL (ref 32.0–36.0)
MCV: 87.7 fL (ref 80.0–100.0)
MONO ABS: 0.3 10*3/uL (ref 0.2–1.0)
Monocytes Relative: 7 %
Neutro Abs: 3.5 10*3/uL (ref 1.4–6.5)
Neutrophils Relative %: 90 %
PLATELETS: 64 10*3/uL — AB (ref 150–440)
RBC: 2.62 MIL/uL — AB (ref 4.40–5.90)
RDW: 15.4 % — AB (ref 11.5–14.5)
WBC: 3.9 10*3/uL (ref 3.8–10.6)

## 2016-04-08 LAB — ABO/RH: ABO/RH(D): O POS

## 2016-04-08 MED ORDER — HYDROCODONE-ACETAMINOPHEN 5-325 MG PO TABS: 1.0000 | ORAL_TABLET | ORAL | Status: DC | PRN

## 2016-04-08 MED ORDER — SODIUM CHLORIDE 0.9 % IV SOLN
Freq: Once | INTRAVENOUS | Status: AC
  Administered 2016-04-09: 01:00:00 via INTRAVENOUS

## 2016-04-08 MED ORDER — FUROSEMIDE 10 MG/ML IJ SOLN
20.0000 mg | Freq: Once | INTRAMUSCULAR | Status: AC
  Administered 2016-04-09: 04:00:00 20 mg via INTRAVENOUS
  Filled 2016-04-08: qty 2

## 2016-04-08 MED ORDER — ACETAMINOPHEN 325 MG PO TABS
650.0000 mg | ORAL_TABLET | Freq: Once | ORAL | Status: DC
  Filled 2016-04-08: qty 2

## 2016-04-08 NOTE — Progress Notes (Signed)
[place] NOTE  Patient Care Team: Alfonse Flavors, MD as PCP - General (Family Medicine)  CHIEF COMPLAINTS/PURPOSE OF CONSULTATION:  Persistent nausea vomiting/multiple myeloma  CC; nausea vomiting/poor by mouth intake continued- slight improvement" as per family Hicupps; continues to have poor appetite /poor by mouth intake. Feels weak . No fever no chills. No bleeding.  MEDICAL HISTORY:  Past Medical History:  Diagnosis Date  . Anemia   . Cancer (HCC)    Bone metastasis  . Constipation   . Hearing loss   . Hoarse voice quality   . Liver lesion   . Malaria 2003  . Multiple myeloma ([place]) 03/22/2016   Per Dr. Rogue Bussing on his PET order.  . Tuberculosis 1985    SURGICAL HISTORY: Past Surgical History:  Procedure Laterality Date  . RETINAL DETACHMENT SURGERY  2006    SOCIAL HISTORY: Lives in [place]. No smoking or alcohol. he teaches yoga. Social History   Social History  . Marital status: Married    Spouse name: N/A  . Number of children: N/A  . Years of education: N/A   Occupational History  . Not on file.   Social History Main Topics  . Smoking status: Never Smoker  . Smokeless tobacco: Never Used  . Alcohol use No  . Drug use: No  . Sexual activity: Not on file   Other Topics Concern  . Not on file   Social History Narrative  . No narrative on file    FAMILY HISTORY: History reviewed. No pertinent family history.  ALLERGIES:  has No Known Allergies.  MEDICATIONS:  Current Facility-Administered Medications  Medication Dose Route Frequency Provider Last Rate Last Dose  . 0.9 %  sodium chloride infusion   Intravenous Continuous [name], MD 50 mL/hr at 04/08/16 1158    . acetaminophen (TYLENOL) tablet 650 mg  650 mg Oral Q6H PRN Harrie Foreman, MD       Or  . acetaminophen (TYLENOL) suppository 650 mg  650 mg Rectal Q6H PRN Harrie Foreman, MD      . acyclovir (ZOVIRAX) 200 MG capsule 400 mg  400 mg  Oral BID Harrie Foreman, MD   400 mg at 04/08/16 0951  . alum & mag hydroxide-simeth (MAALOX/MYLANTA) 200-200-20 MG/5ML suspension 30 mL  30 mL Oral Q4H PRN Cammie Sickle, MD      . docusate sodium (COLACE) capsule 100 mg  100 mg Oral BID Harrie Foreman, MD      . dronabinol (MARINOL) capsule 2.5 mg  2.5 mg Oral BID AC Harrie Foreman, MD   2.5 mg at 04/08/16 1749  . HYDROmorphone (DILAUDID) injection 1 mg  1 mg Intravenous Q4H PRN [name], MD   1 mg at 04/08/16 0436  . magnesium hydroxide (MILK OF MAGNESIA) suspension 5 mL  5 mL Oral Daily PRN Harrie Foreman, MD   5 mL at 04/08/16 1747  . MEDLINE mouth rinse  15 mL Mouth Rinse BID [name], MD   15 mL at 04/08/16 0952  . metoCLOPramide (REGLAN) injection 5 mg  5 mg Intravenous Q8H PRN [name], MD   5 mg at 04/06/16 1701  . ondansetron (ZOFRAN) tablet 4 mg  4 mg Oral Q6H PRN Harrie Foreman, MD       Or  . ondansetron [facility]) injection 4 mg  4 mg Intravenous Q6H PRN Harrie Foreman, MD   4 mg at 04/06/16 0920  . pantoprazole (PROTONIX)  80 mg in sodium chloride 0.9 % 250 mL (0.32 mg/mL) infusion  8 mg/hr Intravenous Continuous [name], MD 25 mL/hr at 04/08/16 1403 8 mg/hr at 04/08/16 1403  . prochlorperazine (COMPAZINE) injection 10 mg  10 mg Intravenous Q6H PRN [name], MD   10 mg at 04/08/16 1253  . sucralfate (CARAFATE) tablet 1 g  1 g Oral TID Cammie Sickle, MD   1 g at 04/08/16 1403      .  PHYSICAL EXAMINATION:  Vitals:   04/07/16 2111 04/08/16 0443  BP: 103/61 113/64  Pulse: 78 82  Resp: 16 18  Temp: 97.8 F (36.6 C) 98.4 F (36.9 C)   Filed Weights   04/06/16 0511 04/07/16 0500 04/08/16 0500  Weight: 103 lb 14.4 oz (47.1 kg) 100 lb 12.8 oz (45.7 kg) 101 lb 4.8 oz (45.9 kg)    GENERAL: Cachectic appearing; mild distress because of nausea and vomiting. Accompanied by his daughter and/family EYES: no pallor or icterus OROPHARYNX: no  thrush or ulceration. NECK: supple, no masses felt LYMPH:  no palpable lymphadenopathy in the cervical, axillary or inguinal regions LUNGS: decreased breath sounds to auscultation at bases and  No wheeze or crackles HEART/CVS: regular rate & rhythm and no murmurs; No lower extremity edema ABDOMEN: abdomen soft, non-tender and normal bowel sounds Musculoskeletal:no cyanosis of digits and no clubbing  PSYCH: alert & oriented x 3 with fluent speech NEURO: no focal motor/sensory deficits SKIN:  no rashes or significant lesions  LABORATORY DATA:  I have reviewed the data as listed Lab Results  Component Value Date   WBC 2.6 (L) 04/07/2016   HGB 8.0 (L) 04/07/2016   HCT 23.2 (L) 04/07/2016   MCV 87.8 04/07/2016   PLT 67 (L) 04/07/2016    Recent Labs  03/22/16 0859  04/04/16 1511 04/06/16 0014 04/07/16 0457  NA 129*  < > 131* 138 139  K 4.5  < > 4.3 4.1 3.7  CL 99*  < > 105 113* 117*  CO2 25  < > 23 22 20*  GLUCOSE 128*  < > 131* 141* 82  BUN 32*  < > 24* 42* 22*  CREATININE 0.90  < > 1.06 1.15 0.87  CALCIUM 9.2  < > 8.4* 8.4* 7.2*  GFRNONAA >60  < > >60 >60 >60  GFRAA >60  < > >60 >60 >60  PROT 8.1  --   --  7.8  --   ALBUMIN 3.2*  --   --  2.6*  --   AST 35  --   --  29  --   ALT 38  --   --  23  --   ALKPHOS 87  --   --  78  --   BILITOT 0.7  --   --  0.6  --   < > = values in this interval not displayed.  RADIOGRAPHIC STUDIES: I have personally reviewed the radiological images as listed and agreed with the findings in the report. Ct Chest W Contrast  Result Date: 03/15/2016 CLINICAL DATA:  Abnormal chest x-ray.  Terrible back pain. EXAM: CT CHEST WITH CONTRAST TECHNIQUE: Multidetector CT imaging of the chest was performed during intravenous contrast administration. CONTRAST:  57m ISOVUE-300 IOPAMIDOL (ISOVUE-300) INJECTION 61% COMPARISON:  Two-view chest x-ray 03/07/2016 FINDINGS: Cardiovascular: The heart size is normal. No significant coronary artery calcifications  are present. There is no significant pericardial effusion. Mediastinum/Nodes: No significant mediastinal or axillary adenopathy is present. Atherosclerotic calcifications are present at  the aortic arch. Lungs/Pleura: Biapical pleural parenchymal scarring is present. There is upward traction on the hila bilaterally. Emphysematous changes are noted. No focal nodule, mass, or airspace disease is evident. Upper Abdomen: Multiple hypodense lesions are present within the liver. The largest lesion is in segment 4 A measuring 14 mm. This may be a cyst. There are other more subtle hypodense lesions. Most worrisome is a far right lateral lesion in segment 7 on image 62 of series 2. This measures 15 mm. The visualized abdominal organs are otherwise unremarkable. Musculoskeletal: A large soft tissue lesion is centered along the posterior left ninth rib measuring 5.7 x 3.3 x 4.5 cm. There is no significant invasion of the spinal canal although the lesion does extend to the left T8-9 foramen. There is tumor invasion and the posterior elements of T9 on the left. An additional more lateral left T9 lytic lesion and pathologic fracture is noted. Focal irregularity of the posterior left T12 rib the likely represents a metastatic lesion as well. The pathologic fractures present at T10 with extraosseous tumor extending into the spinal canal at T10 and to the right. There is tumor in both feet T9-10 and T10-11 foramen on the right. Marked osteopenia is present. There is lytic tumor involving the posterior elements on the right at T3 with probable involvement of the right T3-4 foramen. There is also a lytic lesion anteriorly within the T3 vertebral body without collapse. A far right T4 lesion measures 14 mm without collapse. In anterior lesion in T6 measures 14 mm. A 9 mm lesion is present within the right posterior element of T9. Infiltrative tumor is suspected posteriorly at T11 without collapse or extraosseous extension of tumor.  Multiple other smaller spinous lesions are suspected. MRI of the thoracic spine without and with contrast would be useful for further evaluation. IMPRESSION: 1. Multiple lytic lesions throughout the thoracic spine and ribs as described suggesting metastatic disease. 2. The lesion evident on the chest x-ray corresponds with a 5.7 x 3.3 x 4.5 cm mass infiltrating the left T9 rib with extension to the left T8-9 foramen but no invasion of the foramen. 3. 4.1 x 3.1 cm exophytic mass lesion at T10 with pathologic fracture. The tumor extends into the spinal canal with moderate central canal stenosis and right greater than left foraminal narrowing at T9-10 and T10-11. 4. Prominent infiltrative tumor posteriorly at T11 without compression fracture. 5. Additional pathologic fracture within the more distal left ninth rib. 6. Marked osteopenia with probable hemangioma is an high suspicion for multiple additional lesions throughout the thoracic spine. Recommend MRI of the thoracic spine without and with contrast for further evaluation 7. Multiple hypodense lesions within the liver also raise concern for metastatic disease. Dedicated CT of the abdomen and pelvis with contrast is recommended for further evaluation of metastatic disease and possible primary source. 8. Emphysema and biapical scarring in the lungs without evidence for primary tumor. These results were called by telephone at the time of interpretation on 03/15/2016 at 11:22 am to [place] [facility], [place], who verbally acknowledged these results. Electronically Signed   By: San Morelle M.D.   On: 03/15/2016 11:44   Nm Pet Image Initial (pi) Whole Body  Result Date: 03/29/2016 CLINICAL DATA:  Initial treatment strategy for multiple myeloma. PET-CT was attempted on 03/22/2016, however the PET portion of the study could not be completed due to patient discomfort. Status post radiation therapy and chemotherapy 1 day prior with steroids. EXAM: NUCLEAR MEDICINE PET WHOLE  BODY  TECHNIQUE: 12.8 mCi F-18 FDG was injected intravenously. Full-ring PET imaging was performed from the vertex to the feet after the radiotracer. CT data was obtained and used for attenuation correction and anatomic localization. FASTING BLOOD GLUCOSE:  Value:  119 mg/dl COMPARISON:  03/22/2016 incomplete PET-CT and 03/15/2016 chest CT. FINDINGS: Head/Neck: No hypermetabolic lymph nodes in the neck. Chest: No hypermetabolic axillary, mediastinal or hilar nodes. Top-normal heart size. Atherosclerotic nonaneurysmal thoracic aorta. No pleural effusions. There are 4 mm pulmonary nodules in the anterior right upper lobe (series 3/image 96) and right middle lobe (series 3/image 147), below PET resolution. No acute consolidative airspace disease. Abdomen/Pelvis: No abnormal hypermetabolic activity within the liver, pancreas, adrenal glands, or spleen. No hypermetabolic lymph nodes in the abdomen or pelvis. There are 2 simple right liver cysts, largest 1.4 cm. Atherosclerotic nonaneurysmal abdominal aorta. Skeleton: There are numerous lytic bone lesions with associated mild hypermetabolism throughout the spine, ribs and pelvis, several of which are expansile liver extraosseous soft tissue components. For example an expansile 5.1 x 3.1 cm posterior left ninth rib lytic bone lesion with max SUV 3.1 (series 3/ image 130), a right T10 expansile 4.3 x 3.3 cm lytic bone lesion with max SUV 2.9 and associated pathologic T10 compression fracture, and a right upper sacral 2.7 x 2.0 cm mildly expansile lytic bone lesion (series 3/ image 217) with adjacent pathologic vertical right sacral ala fracture with max SUV 3.0. Extremities: No hypermetabolic activity to suggest metastasis. FDG uptake in the left antecubital fossa/left forearm is injection related. IMPRESSION: 1. Mildly hypermetabolic lytic bone lesions throughout the axial skeleton, several of which are expansile with extra-osseous soft tissue components, consistent with  multiple myeloma. Pathologic fractures in the T10 vertebral body and right sacral ala. 2. Two 4 mm right pulmonary nodules, below PET resolution. A follow-up unenhanced chest CT could be obtained in 3-6 months. 3. Aortic atherosclerosis. Electronically Signed   By: Ilona Sorrel M.D.   On: 03/29/2016 10:25   Nm Pet Image Initial (pi) Whole Body  Result Date: 03/22/2016 CLINICAL DATA:  Subsequent treatment strategy for multiple myeloma EXAM: NUCLEAR MEDICINE PET WHOLE BODY TECHNIQUE: 14.2 mCi F-18 FDG was injected intravenously. CT data was obtained and used for attenuation correction and anatomic localization only. (This was not acquired as a diagnostic CT examination.) Additional exam technical data entered on technologist worksheet. FASTING BLOOD GLUCOSE:  Value: 105 mg/dl COMPARISON:  Chest CT 03/15/2016. FINDINGS: Comment: Per report from the technologist, the patient was in considerable pain during the examination. Despite efforts to maintain an acceptable level of comfort for the patient, the patient was unable to tolerate the entire examination. CT images were obtained, but PET images were discontinued after acquisition through only the head. PET images through the neck, chest, abdomen, pelvis and lower extremities were not obtained. Accordingly, the examination will be interpreted as a noncontrast CT of the head, neck, chest, abdomen, pelvis, skeleton and extremities. Head/Neck: No definite intracranial mass. No hydrocephalus. No aggressive appearing osseous lesions are noted in the skull. No definite cervical lymphadenopathy noted on the noncontrast CT images. Chest: Heart size is normal. There is no significant pericardial fluid, thickening or pericardial calcification. Aortic atherosclerosis, without definite aneurysm. No definite lymphadenopathy noted in the mediastinal or hilar nodal stations. Please note that accurate exclusion of hilar adenopathy is limited on noncontrast CT scans. Esophagus is  unremarkable in appearance. No axillary lymphadenopathy. Mass effect upon the left lower lobe (discussed below) related to expansile left ninth rib lesion. No  suspicious appearing pulmonary nodules or masses. Mild bilateral apical nodular pleuroparenchymal thickening, most compatible with chronic post infectious or inflammatory scarring. No acute consolidative airspace disease. No pleural effusions. Abdomen/Pelvis: Several low-attenuation lesions are again noted in the liver, largest of which is in the central aspect of segment 8 measuring 1.4 x 0.9 cm. These are incompletely characterized on today's noncontrast CT examination, but are favored to represent tiny cysts. Gallbladder is poorly visualized and may be decompressed or surgically absent (no surgical clips are noted in the gallbladder fossa). Unenhanced appearance of the pancreas, spleen, bilateral adrenal glands and bilateral kidneys is unremarkable. No hydroureteronephrosis. Unenhanced appearance of the urinary bladder is normal. No pathologic dilatation of small bowel or colon. Skeleton/Extremities: Again noted are expansile lytic lesions involving the left ninth rib and T10 vertebral body where there is a previously recognized pathologic compression fracture. The lesion of the left ninth rib currently measures approximately 3.2 x 6.1 cm (image 130 of series 3) which is similar to the recent chest CT. Lesion in the right side of the T10 vertebral body extends into the adjacent paraspinal soft tissues and currently measures 3.4 x 3.5 cm, also similar to the prior examination. Large lytic lesion in the anterior aspect of the L4 vertebral body measuring 3.2 x 3.1 cm. Large lytic lesion in the right side of the sacrum just medial to the sacroiliac joint measuring 1.9 x 2.3 cm. Lytic lesion in the right ilium measuring 2.9 x 1.4 cm. Several other smaller lytic lesions are also noted. IMPRESSION: 1. Widespread lytic lesions throughout the visualized axial and  appendicular skeleton, compatible with the reported clinical history of multiple myeloma. This is most notable for the lytic lesion with pathologic compression fracture in the T10 vertebral body. No definite extra-skeletal involvement noted on today's examination, which was limited by lack of acquisition of diagnostic PET images secondary to patient pain during the examination. 2. Aortic atherosclerosis. 3. Additional incidental findings, as above. Electronically Signed   By: Vinnie Langton M.D.   On: 03/22/2016 14:41   Ct Biopsy  Result Date: 03/25/2016 INDICATION: Multiple bony lytic lesions.  Possible multiple myeloma. EXAM: CT BIOPSY MEDICATIONS: None. ANESTHESIA/SEDATION: Moderate (conscious) sedation was employed during this procedure. A total of Versed 1.0 mg and Fentanyl 50 mcg was administered intravenously. Moderate Sedation Time: 18 minutes. The patient's level of consciousness and vital signs were monitored continuously by radiology nursing throughout the procedure under my direct supervision. COMPLICATIONS: None immediate. PROCEDURE: Informed written consent was obtained from the patient after a thorough discussion of the procedural risks, benefits and alternatives. All questions were addressed. Maximal Sterile Barrier Technique was utilized including mask, sterile gloves, sterile drape, hand hygiene and skin antiseptic. A timeout was performed prior to the initiation of the procedure. The patient was placed prone on the CT scanner. Posterior flank region was prepped and draped using sterile technique. Local anesthetic was applied. Under CT guidance, 22 gauge spinal needle is directed toward the posterior margin of the right iliac bone. Lidocaine was injected subperiosteally. Needle was removed. Then, also under CT guidance, trocar was directed through posterior cortex of right iliac bone. Bone marrow aspirations were obtained with and without heparin, and given to the pathology technologist who  was present at that time. Then, bone marrow core biopsy was obtained, and this sample was also given to the pathology technologist. Needle was removed and appropriate dressing was applied. IMPRESSION: Under CT guidance, percutaneous bone marrow aspiration and core biopsy of right iliac bone  was performed. Electronically Signed   By: Marijo Conception, M.D.   On: 03/25/2016 10:14   Dg Chest Portable 1 View  Result Date: 04/06/2016 CLINICAL DATA:  Cough, dyspnea. History of multiple myeloma, T10 pathologic fracture. EXAM: PORTABLE CHEST 1 VIEW COMPARISON:  PET-CT March 29, 2016 FINDINGS: Cardiac silhouette is moderately enlarged, mediastinal silhouette is nonsuspicious, mildly calcified aortic knob. Increased lung volumes. No pleural effusion or focal consolidation. No pneumothorax. Apical pleural thickening. Osteopenia. Cachexia. Bold severe T10 compression fracture. IMPRESSION: Moderate cardiomegaly.  Increased lung volumes. Osteopenia with severe T10 fracture corresponding known pathologic fracture. Electronically Signed   By: Elon Alas M.D.   On: 04/06/2016 03:07   Dg Bone Survey Met  Result Date: 03/16/2016 CLINICAL DATA:  Multiple myeloma EXAM: METASTATIC BONE SURVEY COMPARISON:  03/15/2016 FINDINGS: Lateral view of the skull shows no lytic or blastic lesion. Lateral view of the cervical spine shows diffuse osteopenia. No lytic or blastic lesions. Mild disc space flattening at C3-C4-C4-C5 and C5-C6 level. Frontal view bilateral shoulder shows no lytic or blastic lesion. Frontal view of the neck shows no lytic or blastic lesion. Bilateral humerus demonstrates no lytic or blastic lesion. Bilateral forearm shows no lytic or blastic lesions. There is dextroscoliosis lower thoracic spine. Mild levoscoliosis lumbar spine. Significant compression fracture of T10 vertebral body suspicious for pathologic fracture. Expansile lesion of the left eighth rib suspicious for metastatic disease. Frontal view of  the pelvis shows no lytic or blastic lesions. Frontal view bilateral hip and proximal femur is unremarkable. Frontal view bilateral femur shows no lytic or blastic lesions. Bilateral tibia and fibula shows no lytic or blastic lesions. IMPRESSION: 1. There is probable pathologic fracture of T10 vertebral body suspicious for bony lesion. Expansile lesion of left posterior eighth rib suspicious for metastatic disease. Please see patient's CT scan of the chest from yesterday. Electronically Signed   By: Lahoma Crocker M.D.   On: 03/16/2016 17:00    ASSESSMENT & PLAN:   # 70 year old male patient with newly diagnosed multiple myeloma currently admitted to the hospital for intractable nausea and vomiting/ poor by mouth intake.  # Intractable nausea vomiting/Hicupps/likely secondary to gastritis-currently on Protonix drip. Slight improvement noted.  # Hoarseness of voice/for the last 4-6 weeks- discussed with Dr. Pryor Ochoa. Plan out patient evaluation/ endoscopy.  # Multiple myeloma/anemia mild thrombocytopenia- newly diagnosed on Velcade and dexamethasone; currently treatment on hold.   # Anemia/thrombocytopenia platelets 67- recommend repeating CBC- if hemoglobin less than 8 recommend 2 units of PRBC transfusion.  # Pain from malignancy- continue hydrocodone as needed.  Discussed with Dr.Konidena.     Cammie Sickle, MD 04/08/2016 6:41 PM

## 2016-04-08 NOTE — Progress Notes (Signed)
PT Cancellation Note  Patient Details Name: Francisco [name] MRN: [id number] DOB: March 16, 1946   Cancelled Treatment:    Reason Eval/Treat Not Completed: Patient declined, no reason specified Pt reports he is in too much pain to work with PT today.  Pt's Hgb is also low (8.0, on downward trend).  Pt agrees to try PT tomorrow.  Kreg Shropshire 04/08/2016, 4:41 PM

## 2016-04-08 NOTE — Consult Note (Signed)
..  [name], [name] [id number] 07/16/1945 Francisco Lesches, MD  Reason for Consult: dysphonia  HPI: 70 y.o. Male admitted for intractable N/V and hiccups after chemotherapy.  Family members state that over the last 2 years his voice has gradually gotten weaker and more difficult to understand.  PET scan performed recently did not reveal any head and neck soft tissue disease or abnormality of vocal cords.  Recent MBSS revealed no evidence of aspiration and patient was cleared for thin liquids.  Allergies: No Known Allergies  ROS: Review of systems normal other than 12 systems except per HPI.  PMH:  Past Medical History:  Diagnosis Date  . Anemia   . Cancer (HCC)    Bone metastasis  . Constipation   . Hearing loss   . Hoarse voice quality   . Liver lesion   . Malaria 2003  . Multiple myeloma ([place]) 03/22/2016   Per Dr. Rogue Bussing on his PET order.  . Tuberculosis 1985    FH: History reviewed. No pertinent family history.  SH:  Social History   Social History  . Marital status: Married    Spouse name: N/A  . Number of children: N/A  . Years of education: N/A   Occupational History  . Not on file.   Social History Main Topics  . Smoking status: Never Smoker  . Smokeless tobacco: Never Used  . Alcohol use No  . Drug use: No  . Sexual activity: Not on file   Other Topics Concern  . Not on file   Social History Narrative  . No narrative on file    PSH:  Past Surgical History:  Procedure Laterality Date  . 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  Given recent severe N/V after discussion with patient and family, decision to hold on flexible laryngoscopy at this time.  Please have patient follow up as outpatient for stroboscopy.   Francisco [name] 04/08/2016 1:33 PM

## 2016-04-08 NOTE — Progress Notes (Signed)
Speech Therapy Note: reviewed chart notes; consulted NSG who reported pt was eating and drinking little bits of foods/liquids at a time. Recommended encouraging pt to choose broken down, soft, moist foods and follow Reflux precautions. Noted GI consult as well.  No ST services indicated at this time as no oropharyngeal phase dysphagia was assessed; more GI dysmotility w/ regurgitation noted. Recommend general aspiration precautions w/ any oral intake, meds in Pure[place]. NSG agreed.

## 2016-04-08 NOTE — Progress Notes (Signed)
[name] [place] NAME: Francisco [name]    MR#:  [id number]  DATE OF BIRTH:  Jul 07, 1946  SUBJECTIVE: Admitted for intractable nausea, vomiting. Started on Protonix drip. Eating little better than yesterday. No hiccups. Plan is to today ambulate him and see how he does . continue Protonix drip. Appreciate GI following the patient today.   CHIEF COMPLAINT:   Chief Complaint  Patient presents with  . Emesis    REVIEW OF SYSTEMS:    Review of Systems  Constitutional: Positive for weight loss. Negative for chills and fever.  HENT: Negative for hearing loss.   Eyes: Negative for blurred vision, double vision and photophobia.  Respiratory: Negative for cough, hemoptysis and shortness of breath.   Cardiovascular: Negative for palpitations, orthopnea and leg swelling.  Gastrointestinal: Positive for heartburn, nausea and vomiting. Negative for abdominal pain and diarrhea.  Genitourinary: Negative for dysuria and urgency.  Musculoskeletal: Negative for myalgias and neck pain.  Skin: Negative for rash.  Neurological: Positive for weakness. Negative for dizziness, focal weakness, seizures and headaches.  Psychiatric/Behavioral: Negative for memory loss. The patient does not have insomnia.     Nutrition:  Tolerating Diet:no Tolerating PT:      DRUG ALLERGIES:  No Known Allergies  VITALS:  Blood pressure 113/64, pulse 82, temperature 98.4 F (36.9 C), temperature source Oral, resp. rate 18, height 5' 10" (1.778 m), weight 45.9 kg (101 lb 4.8 oz), SpO2 99 %.  PHYSICAL EXAMINATION:   Physical Exam  GENERAL:  70 y.o.-year-old patient lying in the bed with no acute distress.cachectic. EYES: Pupils equal, round, reactive to light and accommodation. No scleral icterus. Extraocular muscles intact.  HEENT: Head atraumatic, normocephalic. Oropharynx and nasopharynx clear.  NECK:  Supple, no jugular venous distention. No thyroid  enlargement, no tenderness.  LUNGS: Normal breath sounds bilaterally, no wheezing, rales,rhonchi or crepitation. No use of accessory muscles of respiration.  CARDIOVASCULAR: S1, S2 normal. No murmurs, rubs, or gallops.  ABDOMEN: Soft, nontender, nondistended. Bowel sounds present. No organomegaly or mass.  EXTREMITIES: No pedal edema, cyanosis, or clubbing.  NEUROLOGIC: Cranial nerves II through XII are intact. Muscle strength 5/5 in all extremities. Sensation intact. Gait not checked.  PSYCHIATRIC: The patient is alert and oriented x 3.  SKIN: No obvious rash, lesion, or ulcer.    LABORATORY PANEL:   CBC  Recent Labs Lab 04/07/16 0457  WBC 2.6*  HGB 8.0*  HCT 23.2*  PLT 67*   ------------------------------------------------------------------------------------------------------------------  Chemistries   Recent Labs Lab 04/06/16 0014 04/07/16 0457  NA 138 139  K 4.1 3.7  CL 113* 117*  CO2 22 20*  GLUCOSE 141* 82  BUN 42* 22*  CREATININE 1.15 0.87  CALCIUM 8.4* 7.2*  MG 2.2  --   AST 29  --   ALT 23  --   ALKPHOS 78  --   BILITOT 0.6  --    ------------------------------------------------------------------------------------------------------------------  Cardiac Enzymes No results for input(s): TROPONINI in the last 168 hours. ------------------------------------------------------------------------------------------------------------------  RADIOLOGY:  No results found.   ASSESSMENT AND PLAN:   Active Problems:   Intractable nausea and vomiting   1. intractable nausea and vomiting likely secondary to acute gastritis from steroid use. Protonix drip, IV Zofran, IV Phenergan., IV hydration'Slowly improving, discuss with oncology, continue the IV  protonix drip today. #2 multiple myeloma with chronic bone pains: Continue IV pain medicines. #3 severe malnutrition in the context of chronic illness: Continue Magic cups, Marinol   #  4. 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 #6 physical therapy consult today., Appreciate GI following the patient today. d/w patient's daughter, Location manager.   All the records are reviewed and case discussed with Care Management/Social Workerr. Management plans discussed with the patient, family and they are in agreement.  CODE STATUS: full  TOTAL TIME TAKING CARE OF THIS PATIENT: 35 minutes.   POSSIBLE D/C IN  1-2 DAYS, DEPENDING ON CLINICAL CONDITION.   Epifanio [name] M.D on 04/08/2016 at 9:22 AM  Between 7am to 6pm - Pager - [phone]  After 6pm go to [email or web address] - password [email or web address] [facility]  Office  [phone]  CC: Primary care physician; Alfonse Flavors, MD

## 2016-04-09 DIAGNOSIS — K2900 Acute gastritis without bleeding: Secondary | ICD-10-CM | POA: Diagnosis not present

## 2016-04-09 DIAGNOSIS — R112 Nausea with vomiting, unspecified: Secondary | ICD-10-CM | POA: Diagnosis not present

## 2016-04-09 DIAGNOSIS — R630 Anorexia: Secondary | ICD-10-CM

## 2016-04-09 DIAGNOSIS — R634 Abnormal weight loss: Secondary | ICD-10-CM

## 2016-04-09 LAB — CBC
HEMATOCRIT: 31.7 % — AB (ref 40.0–52.0)
Hemoglobin: 10.8 g/dL — ABNORMAL LOW (ref 13.0–18.0)
MCH: 28.8 pg (ref 26.0–34.0)
MCHC: 34.2 g/dL (ref 32.0–36.0)
MCV: 84.4 fL (ref 80.0–100.0)
Platelets: 69 10*3/uL — ABNORMAL LOW (ref 150–440)
RBC: 3.76 MIL/uL — ABNORMAL LOW (ref 4.40–5.90)
RDW: 17.2 % — AB (ref 11.5–14.5)
WBC: 3.7 10*3/uL — AB (ref 3.8–10.6)

## 2016-04-09 LAB — BASIC METABOLIC PANEL
ANION GAP: 6 (ref 5–15)
BUN: 15 mg/dL (ref 6–20)
CALCIUM: 7.1 mg/dL — AB (ref 8.9–10.3)
CO2: 21 mmol/L — AB (ref 22–32)
Chloride: 113 mmol/L — ABNORMAL HIGH (ref 101–111)
Creatinine, Ser: 0.85 mg/dL (ref 0.61–1.24)
GFR calc Af Amer: >60 mL/min (ref >60)
GFR calc non Af Amer: >60 mL/min (ref >60)
GLUCOSE: 98 mg/dL (ref 65–99)
Potassium: 2.8 mmol/L — ABNORMAL LOW (ref 3.5–5.1)
Sodium: 140 mmol/L (ref 135–145)

## 2016-04-09 MED ORDER — PANTOPRAZOLE SODIUM 40 MG IV SOLR
40.0000 mg | Freq: Two times a day (BID) | INTRAVENOUS | Status: DC
  Administered 2016-04-10: 21:00:00 40 mg via INTRAVENOUS
  Filled 2016-04-09: qty 40

## 2016-04-09 MED ORDER — SODIUM CHLORIDE 0.9% FLUSH: 3.0000 mL | INTRAVENOUS | Status: DC | PRN

## 2016-04-09 MED ORDER — SODIUM CHLORIDE 0.9% FLUSH
3.0000 mL | Freq: Two times a day (BID) | INTRAVENOUS | Status: DC
  Administered 2016-04-09 – 2016-04-13 (×6): 3 mL via INTRAVENOUS

## 2016-04-09 MED ORDER — POTASSIUM CHLORIDE 10 MEQ/100ML IV SOLN
10.0000 meq | INTRAVENOUS | Status: AC
  Administered 2016-04-09 (×4): 10 meq via INTRAVENOUS
  Filled 2016-04-09 (×4): qty 100

## 2016-04-09 MED ORDER — FENTANYL 12 MCG/HR TD PT72
12.5000 ug | MEDICATED_PATCH | TRANSDERMAL | Status: DC
  Administered 2016-04-09 – 2016-04-12 (×2): 12.5 ug via TRANSDERMAL
  Filled 2016-04-09 (×2): qty 1

## 2016-04-09 NOTE — Progress Notes (Signed)
PT Cancellation Note  Patient Details Name: Francisco [name] MRN: [id number] DOB: [birth year]   Cancelled Treatment:    Reason Eval/Treat Not Completed:  (Family declined as pt had not slept, may try later if time) permits and pt is able.   Ramond Dial 04/09/2016, 11:41 AM    [name], PT MS Acute Rehab Dept. Number: [place] [facility] [place]

## 2016-04-09 NOTE — Progress Notes (Signed)
PT Cancellation Note  Patient Details Name: Francisco [name] MRN: [id number] DOB: 1946/01/28   Cancelled Treatment:    Reason Eval/Treat Not Completed: Medical issues which prohibited therapy.  Has consult for PEG tube and will wait due to declined labs that make pt weak and not a good candidate to move today.  See in the AM.   Ramond Dial 04/09/2016, 4:17 PM    [name], PT MS Acute Rehab Dept. Number: [place] [facility] [place]

## 2016-04-09 NOTE — Plan of Care (Signed)
Problem: Nutrition: Goal: Adequate nutrition will be maintained Outcome: Not Progressing Limited appetite with pt eating small bites of foods/sips of liquids primarily water. Family providing food preferences and offering frequently

## 2016-04-09 NOTE — Progress Notes (Signed)
Generalized weakness with poor appetite with family very active and supportive in care. Cough produces small amounts stringy clear mucus. Up in chair alternating with periods of bedrest. Ambulated in hall with walker to Rm 15 and back. Dilaudid IV x 1 effective. K+ IV supplementation in process.

## 2016-04-09 NOTE — Progress Notes (Signed)
[facility] at [place] NAME: Francisco [name]    MR#:  [id number]  DATE OF BIRTH:  1946-06-19  SUBJECTIVE; received 2 units of transfusion. Eating little better than yesterday. Wants to work with physical therapy from tomorrow. DIL  requesting fentanyl patch for the pain control.   CHIEF COMPLAINT:   Chief Complaint  Patient presents with  . Emesis    REVIEW OF SYSTEMS:    Review of Systems  Constitutional: Positive for weight loss. Negative for chills and fever.  HENT: Negative for hearing loss.   Eyes: Negative for blurred vision, double vision and photophobia.  Respiratory: Negative for cough, hemoptysis and shortness of breath.   Cardiovascular: Negative for palpitations, orthopnea and leg swelling.  Gastrointestinal: Positive for heartburn, nausea and vomiting. Negative for abdominal pain and diarrhea.  Genitourinary: Negative for dysuria and urgency.  Musculoskeletal: Negative for myalgias and neck pain.  Skin: Negative for rash.  Neurological: Positive for weakness. Negative for dizziness, focal weakness, seizures and headaches.  Psychiatric/Behavioral: Negative for memory loss. The patient does not have insomnia.     Nutrition:  Tolerating Diet:no Tolerating PT:      DRUG ALLERGIES:  No Known Allergies  VITALS:  Blood pressure 130/74, pulse 68, temperature 97.6 F (36.4 C), temperature source Axillary, resp. rate 16, height 5' 10"  (1.778 m), weight 45.3 kg (99 lb 14.4 oz), SpO2 99 %.  PHYSICAL EXAMINATION:   Physical Exam  GENERAL:  70 y.o.-year-old patient lying in the bed with no acute distress.cachectic. EYES: Pupils equal, round, reactive to light and accommodation. No scleral icterus. Extraocular muscles intact.  HEENT: Head atraumatic, normocephalic. Oropharynx and nasopharynx clear.  NECK:  Supple, no jugular venous distention. No thyroid enlargement, no tenderness.  LUNGS: Normal breath sounds  bilaterally, no wheezing, rales,rhonchi or crepitation. No use of accessory muscles of respiration.  CARDIOVASCULAR: S1, S2 normal. No murmurs, rubs, or gallops.  ABDOMEN: Soft, nontender, nondistended. Bowel sounds present. No organomegaly or mass.  EXTREMITIES: No pedal edema, cyanosis, or clubbing.  NEUROLOGIC: Cranial nerves II through XII are intact. Muscle strength 5/5 in all extremities. Sensation intact. Gait not checked.  PSYCHIATRIC: The patient is alert and oriented x 3.  SKIN: No obvious rash, lesion, or ulcer.    LABORATORY PANEL:   CBC  Recent Labs Lab 04/09/16 0743  WBC 3.7*  HGB 10.8*  HCT 31.7*  PLT 69*   ------------------------------------------------------------------------------------------------------------------  Chemistries   Recent Labs Lab 04/06/16 0014  04/09/16 0743  NA 138  < > 140  K 4.1  < > 2.8*  CL 113*  < > 113*  CO2 22  < > 21*  GLUCOSE 141*  < > 98  BUN 42*  < > 15  CREATININE 1.15  < > 0.85  CALCIUM 8.4*  < > 7.1*  MG 2.2  --   --   AST 29  --   --   ALT 23  --   --   ALKPHOS 78  --   --   BILITOT 0.6  --   --   < > = values in this interval not displayed. ------------------------------------------------------------------------------------------------------------------  Cardiac Enzymes No results for input(s): TROPONINI in the last 168 hours. ------------------------------------------------------------------------------------------------------------------  RADIOLOGY:  No results found.   ASSESSMENT AND PLAN:   Active Problems:   Intractable nausea and vomiting   1. intractable nausea and vomiting likely secondary to acute gastritis from steroid use. Protonix drip, IV Zofran, IV Phenergan., IV  hydration'Slowly improving, discuss with oncology, continue the IV  protonix drip today. Appreciate  GI following the patient. #2 multiple myeloma with chronic bone pains; fentanyl patch today, continue IV when necessary  morphine. #3 severe malnutrition in the context of chronic illness: Continue Magic cups, Marinol ./  #4. 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 5.hoarseness of voice: ENT will follow the patient is an outpatient. #6 physical therapy consult  Change to Po meds;nausea meds; d/w patient's daughter in Sports coach, Location manager.   All the records are reviewed and case discussed with Care Management/Social Workerr. Management plans discussed with the patient, family and they are in agreement.  CODE STATUS: full  TOTAL TIME TAKING CARE OF THIS PATIENT: 35 minutes.   POSSIBLE D/C IN  1-2 DAYS, DEPENDING ON CLINICAL CONDITION.   [name] M.D on 04/09/2016 at 9:57 AM  Between 7am to 6pm - Pager - [phone]  After 6pm go to [email or web address] - password [email or web address] [facility]  Office  [phone]  CC: Primary care physician; Alfonse Flavors, MD

## 2016-04-09 NOTE — Consult Note (Signed)
Consultation  Referring Provider:     No ref. provider found Primary Care Physician:  Alfonse Flavors, MD Primary Gastroenterologist:  Dr.Aseem Sessums        Reason for Consultation:    Anorexia ,nausea, vomiting and weight loss  Date of Admission:  04/05/2016 Date of Consultation:  04/09/2016         HPI:   Francisco [name] is a 70 y.o. male with multiple myeloma with nausea ,vomiting anorexia and weight loss. Significant decease in oral nutrition over recent history.  Past Medical History:  Diagnosis Date  . Anemia   . Cancer (HCC)    Bone metastasis  . Constipation   . Hearing loss   . Hoarse voice quality   . Liver lesion   . Malaria 2003  . Multiple myeloma ([place]) 03/22/2016   Per Dr. Rogue Bussing on his PET order.  . Tuberculosis 1985    Past Surgical History:  Procedure Laterality Date  . RETINAL DETACHMENT SURGERY  2006    Prior to Admission medications   Medication Sig Start Date End Date Taking? Authorizing Provider  acyclovir (ZOVIRAX) 400 MG tablet Take 1 tablet (400 mg total) by mouth 2 (two) times daily. 03/28/16  Yes Cammie Sickle, MD  cholecalciferol (VITAMIN D) 1000 units tablet Take 1,000 Units by mouth daily.   Yes Historical Provider, MD  HYDROcodone-acetaminophen (NORCO/VICODIN) 5-325 MG tablet Take 1 tablet by mouth every 4 (four) hours as needed for moderate pain. 03/18/16  Yes Cammie Sickle, MD  magnesium hydroxide (MILK OF MAGNESIA) 400 MG/5ML suspension Take 5 mLs by mouth daily as needed for mild constipation.   Yes Historical Provider, MD  ondansetron (ZOFRAN) 8 MG tablet Take 1 tablet (8 mg total) by mouth every 8 (eight) hours as needed for nausea or vomiting. 03/24/16  Yes Cammie Sickle, MD  prochlorperazine (COMPAZINE) 10 MG tablet Take 1 tablet (10 mg total) by mouth every 6 (six) hours as needed for nausea or vomiting. 03/24/16  Yes Cammie Sickle, MD  vitamin B-12 (CYANOCOBALAMIN) 1000 MCG tablet Take 1,000 mcg by mouth  daily.   Yes Historical Provider, MD  Vitamin D, Ergocalciferol, (DRISDOL) 50000 units CAPS capsule Take 50,000 Units by mouth every 7 (seven) days.   Yes Historical Provider, MD  lenalidomide (REVLIMID) 20 MG capsule Take 1 capsule (20 mg total) by mouth daily. 3 weeks on and 1 week off. Patient not taking: Reported on 04/06/2016 03/23/16   Cammie Sickle, MD    History reviewed. No pertinent family history.   Social History  Substance Use Topics  . Smoking status: Never Smoker  . Smokeless tobacco: Never Used  . Alcohol use No    Allergies as of 04/05/2016  . (No Known Allergies)    Review of Systems:    All systems reviewed and negative except where noted in HPI.   Physical Exam:  Vital signs in last 24 hours: Temp:  [97.6 F (36.4 C)-98.9 F (37.2 C)] 97.6 F (36.4 C) (09/30 0651) Pulse Rate:  [68-88] 68 (09/30 0651) Resp:  [12-20] 16 (09/30 0651) BP: (122-130)/(71-79) 130/74 (09/30 0651) SpO2:  [97 %-100 %] 99 % (09/30 0651) Weight:  [45.3 kg (99 lb 14.4 oz)] 45.3 kg (99 lb 14.4 oz) (09/30 0454) Last BM Date: 04/04/16 General:   Pleasant, cooperative in NAD Head:  Normocephalic and atraumatic. Eyes:   No icterus.   Conjunctiva pink. PERRLA. Ears:  Normal auditory acuity. Neck:  Supple; no masses or thyroidomegaly Lungs:  Respirations even and unlabored. Lungs clear to auscultation bilaterally.   No wheezes, crackles, or rhonchi.  Heart:  Regular rate and rhythm;  Without murmur, clicks, rubs or gallops Abdomen:  Soft, nondistended, nontender. Normal bowel sounds. No appreciable masses or hepatomegaly.  No rebound or guarding.  Rectal:  Not performed. Msk:  Symmetrical without gross deformities.  Strength - generalized weakness  Extremities:  Without edema, cyanosis or clubbing. Neurologic:  Alert and oriented x3;  grossly normal neurologically. Skin:  Intact without significant lesions or rashes. Cervical Nodes:  No significant cervical adenopathy. Psych:   Alert and cooperative. Normal affect.  LAB RESULTS:  Recent Labs  04/07/16 0457 04/08/16 1946 04/09/16 0743  WBC 2.6* 3.9 3.7*  HGB 8.0* 7.8* 10.8*  HCT 23.2* 23.0* 31.7*  PLT 67* 64* 69*   BMET  Recent Labs  04/07/16 0457 04/09/16 0743  NA 139 140  K 3.7 2.8*  CL 117* 113*  CO2 20* 21*  GLUCOSE 82 98  BUN 22* 15  CREATININE 0.87 0.85  CALCIUM 7.2* 7.1*   LFT No results for input(s): PROT, ALBUMIN, AST, ALT, ALKPHOS, BILITOT, BILIDIR, IBILI in the last 72 hours. PT/INR No results for input(s): LABPROT, INR in the last 72 hours.  STUDIES: No results found.    Impression / Plan:   Francisco [name] is a 70 y.o. y/o male with multiple myeloma with anorexia ,nausea,vomiting and weight loss . Plan - current therapy with consideration on possible PEG depending on current therapy - discussed with family.  Thank you for involving me in the care of this patient.      LOS: 1 day   Drinda Butts, MD  04/09/2016, 1:25 PM   Note: This dictation was prepared with Dragon dictation along with smaller phrase technology. Any transcriptional errors that result from this process are unintentional.

## 2016-04-10 DIAGNOSIS — K2900 Acute gastritis without bleeding: Secondary | ICD-10-CM | POA: Diagnosis not present

## 2016-04-10 DIAGNOSIS — T451X5A Adverse effect of antineoplastic and immunosuppressive drugs, initial encounter: Secondary | ICD-10-CM | POA: Diagnosis not present

## 2016-04-10 DIAGNOSIS — G893 Neoplasm related pain (acute) (chronic): Secondary | ICD-10-CM | POA: Diagnosis not present

## 2016-04-10 DIAGNOSIS — Z681 Body mass index (BMI) 19 or less, adult: Secondary | ICD-10-CM | POA: Diagnosis not present

## 2016-04-10 DIAGNOSIS — E860 Dehydration: Secondary | ICD-10-CM | POA: Diagnosis not present

## 2016-04-10 DIAGNOSIS — E43 Unspecified severe protein-calorie malnutrition: Secondary | ICD-10-CM | POA: Diagnosis not present

## 2016-04-10 DIAGNOSIS — C9000 Multiple myeloma not having achieved remission: Secondary | ICD-10-CM | POA: Diagnosis not present

## 2016-04-10 DIAGNOSIS — R112 Nausea with vomiting, unspecified: Secondary | ICD-10-CM | POA: Diagnosis present

## 2016-04-10 DIAGNOSIS — R066 Hiccough: Secondary | ICD-10-CM | POA: Diagnosis not present

## 2016-04-10 DIAGNOSIS — D630 Anemia in neoplastic disease: Secondary | ICD-10-CM | POA: Diagnosis not present

## 2016-04-10 DIAGNOSIS — R64 Cachexia: Secondary | ICD-10-CM | POA: Diagnosis not present

## 2016-04-10 DIAGNOSIS — E876 Hypokalemia: Secondary | ICD-10-CM | POA: Diagnosis not present

## 2016-04-10 DIAGNOSIS — M549 Dorsalgia, unspecified: Secondary | ICD-10-CM | POA: Diagnosis not present

## 2016-04-10 DIAGNOSIS — F329 Major depressive disorder, single episode, unspecified: Secondary | ICD-10-CM | POA: Diagnosis not present

## 2016-04-10 DIAGNOSIS — D6959 Other secondary thrombocytopenia: Secondary | ICD-10-CM | POA: Diagnosis not present

## 2016-04-10 DIAGNOSIS — R490 Dysphonia: Secondary | ICD-10-CM | POA: Diagnosis not present

## 2016-04-10 DIAGNOSIS — R1310 Dysphagia, unspecified: Secondary | ICD-10-CM | POA: Diagnosis not present

## 2016-04-10 DIAGNOSIS — K5900 Constipation, unspecified: Secondary | ICD-10-CM | POA: Diagnosis not present

## 2016-04-10 LAB — TYPE AND SCREEN
ABO/RH(D): O POS
ANTIBODY SCREEN: NEGATIVE
UNIT DIVISION: 0
UNIT DIVISION: 0

## 2016-04-10 MED ORDER — SERTRALINE HCL 50 MG PO TABS
25.0000 mg | ORAL_TABLET | Freq: Every day | ORAL | Status: DC
  Administered 2016-04-11: 25 mg via ORAL
  Filled 2016-04-10 (×3): qty 1

## 2016-04-10 MED ORDER — LACTULOSE 10 GM/15ML PO SOLN
20.0000 g | Freq: Two times a day (BID) | ORAL | Status: DC | PRN
  Administered 2016-04-10: 20 g via ORAL
  Filled 2016-04-10 (×3): qty 30

## 2016-04-10 NOTE — Progress Notes (Signed)
Pt more quiet/withdrawn, less active. Poor po intake. Pt refusing some meds; takes much encouragement by staff and family to take meds. No BM with last BM 9/25. Lactulose ordered with pt refusing first attempt, but did eventually take most of 1 dose this p.m. Belching frequently; reports no flatus. Denies nausea. Dilaudid x 1 IV effective for abdominal pain.

## 2016-04-10 NOTE — Plan of Care (Signed)
Problem: Fluid Volume: Goal: Ability to maintain a balanced intake and output will improve Outcome: Not Progressing Less appetite with less po intake despite multiple attempts by staff and family. Pt refusing meds at intervals. Complicated by constipation with pt reluctant to comply with MD orders with laxatives.  Problem: Nutrition: Goal: Adequate nutrition will be maintained Outcome: Not Progressing Poor appetite with minimal po intake with multiple attempts with various foods/beverages offered

## 2016-04-10 NOTE — Progress Notes (Signed)
[name]    MR#:  [id number]  DATE OF BIRTH:  June 03, 1946  SUBJECTIVE;;has very poor by mouth intake . patient seen by gastroenterology recommended PEG . According  the daughter-in-law he was crying yesterday and asking her to take him home   CHIEF COMPLAINT:   Chief Complaint  Patient presents with  . Emesis    REVIEW OF SYSTEMS:    Review of Systems  Constitutional: Positive for weight loss. Negative for chills and fever.  HENT: Negative for hearing loss.   Eyes: Negative for blurred vision, double vision and photophobia.  Respiratory: Negative for cough, hemoptysis and shortness of breath.   Cardiovascular: Negative for palpitations, orthopnea and leg swelling.  Gastrointestinal: Positive for heartburn, nausea and vomiting. Negative for abdominal pain and diarrhea.  Genitourinary: Negative for dysuria and urgency.  Musculoskeletal: Negative for myalgias and neck pain.  Skin: Negative for rash.  Neurological: Positive for weakness. Negative for dizziness, focal weakness, seizures and headaches.  Psychiatric/Behavioral: Negative for memory loss. The patient does not have insomnia.     Nutrition:  Tolerating Diet:no Tolerating PT:      DRUG ALLERGIES:  No Known Allergies  VITALS:  Blood pressure 130/79, pulse 64, temperature 98.2 F (36.8 C), temperature source Oral, resp. rate 16, height _0  (1.778 m), weight 46.6 kg (102 lb 11.2 oz), SpO2 100 %.  PHYSICAL EXAMINATION:   Physical Exam  GENERAL:  70 y.o.-year-old patient lying in the bed with no acute distress.cachectic. EYES: Pupils equal, round, reactive to light and accommodation. No scleral icterus. Extraocular muscles intact.  HEENT: Head atraumatic, normocephalic. Oropharynx and nasopharynx clear.  NECK:  Supple, no jugular venous distention. No thyroid enlargement, no tenderness.  LUNGS: Normal breath sounds  bilaterally, no wheezing, rales,rhonchi or crepitation. No use of accessory muscles of respiration.  CARDIOVASCULAR: S1, S2 normal. No murmurs, rubs, or gallops.  ABDOMEN: Soft, nontender, nondistended. Bowel sounds present. No organomegaly or mass.  EXTREMITIES: No pedal edema, cyanosis, or clubbing.  NEUROLOGIC: Cranial nerves II through XII are intact. Muscle strength 5/5 in all extremities. Sensation intact. Gait not checked.  PSYCHIATRIC: The patient is alert and oriented x 3.  SKIN: No obvious rash, lesion, or ulcer.    LABORATORY PANEL:   CBC  Recent Labs Lab 04/09/16 0743  WBC 3.7*  HGB 10.8*  HCT 31.7*  PLT 69*   ------------------------------------------------------------------------------------------------------------------  Chemistries   Recent Labs Lab 04/06/16 0014  04/09/16 0743  NA 138  < > 140  K 4.1  < > 2.8*  CL 113*  < > 113*  CO2 22  < > 21*  GLUCOSE 141*  < > 98  BUN 42*  < > 15  CREATININE 1.15  < > 0.85  CALCIUM 8.4*  < > 7.1*  MG 2.2  --   --   AST 29  --   --   ALT 23  --   --   ALKPHOS 78  --   --   BILITOT 0.6  --   --   < > = values in this interval not displayed. ------------------------------------------------------------------------------------------------------------------  Cardiac Enzymes No results for input(s): TROPONINI in the last 168 hours. ------------------------------------------------------------------------------------------------------------------  RADIOLOGY:  No results found.   ASSESSMENT AND PLAN:   Active Problems:   Intractable nausea and vomiting   Anorexia   Abnormal loss of weight   1. intractable nausea and vomiting likely secondary to acute gastritis  from steroid use. Protonix drip, IV Zofran, IV Phenergan., IV hydration' Still not eating well continue to have poor by mouth intake.  gastroenterology recommended. Placement. Discussed with daughter-in-law extensively, and told that  PEG   Placement will  be permanent because of his severe malnutrition and poor by mouth intake .she  Is  requesting family meeting on Tuesday morning with oncologist to discuss about his prognosis and further treatments for multiple myeloma.   #2 multiple myeloma with chronic bone pains; fentanyl patch , continue IV when necessary morphine. #3 severe malnutrition in the context of chronic illness: Continue Magic cups, Marinol ./  #4. 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  5.hoarseness of voice: ENT will follow the patient is an outpatient. #6 physical therapy consulted. Home health physical therapy. #7 depression: Start on Zoloft 50 MG daily  d/w patient's daughter in Sports coach, Location manager. Spoke with DIL for about 30 min   All the records are reviewed and case discussed with Care Management/Social Workerr. Management plans discussed with the patient, family and they are in agreement.  CODE STATUS: full  TOTAL TIME TAKING CARE OF THIS PATIENT: 35 minutes.   POSSIBLE D/C IN  1-2 DAYS, DEPENDING ON CLINICAL CONDITION.   Francisco [name] M.D on 04/10/2016 at 10:54 AM  Between 7am to 6pm - Pager - [phone]  After 6pm go to [email or web address] - password [email or web address] [facility]  Office  [phone]  CC: Primary care physician; Francisco Flavors, MD

## 2016-04-10 NOTE — Plan of Care (Signed)
Problem: Fluid Volume: Goal: Ability to maintain a balanced intake and output will improve Outcome: Not Progressing Pt continues to c/o nausea with vomiting. Vomited once this shift, zofran given with improvement. Family continues to report poor PO intake.   Problem: Nutrition: Goal: Adequate nutrition will be maintained Outcome: Not Progressing Pt continues to c/o nausea with vomiting. Vomited once this shift, zofran given with improvement. Family continues to report poor PO intake.

## 2016-04-10 NOTE — Progress Notes (Signed)
    Subjective: Continued decreased oral nutrition  Objective: Vital signs in last 24 hours: Vitals:   04/09/16 0651 04/09/16 1428 04/09/16 1949 04/10/16 0516  BP: 130/74 123/79 122/73 130/79  Pulse: 68 97 87 64  Resp: 16     Temp: 97.6 F (36.4 C) 98.2 F (36.8 C) 98.6 F (37 C) 98.2 F (36.8 C)  TempSrc: Axillary Oral Oral Oral  SpO2: 99% 99% 99% 100%  Weight:    46.6 kg (102 lb 11.2 oz)  Height:       Weight change: 1.27 kg (2 lb 12.8 oz)  Intake/Output Summary (Last 24 hours) at 04/10/16 1324 Last data filed at 04/10/16 0800  Gross per 24 hour  Intake          2233.67 ml  Output                0 ml  Net          2233.67 ml     Exam: Heart:: S1S2 present Lungs: clear to auscultation and percussion Abdomen: soft, nontender, normal bowel sounds   Lab Results: @LABTEST2 @ Micro Results: No results found for this or any previous visit (from the past 240 hour(s)). Studies/Results: No results found. Medications: I have reviewed the patient's current medications. Scheduled Meds: . acetaminophen  650 mg Oral Once  . acyclovir  400 mg Oral BID  . docusate sodium  100 mg Oral BID  . dronabinol  2.5 mg Oral BID AC  . fentaNYL  12.5 mcg Transdermal Q72H  . mouth rinse  15 mL Mouth Rinse BID  . pantoprazole (PROTONIX) IV  40 mg Intravenous Q12H  . sertraline  25 mg Oral Daily  . sodium chloride flush  3 mL Intravenous Q12H  . sucralfate  1 g Oral TID   Continuous Infusions: . sodium chloride 50 mL/hr at 04/10/16 0527   PRN Meds:.acetaminophen **OR** acetaminophen, alum & mag hydroxide-simeth, HYDROmorphone (DILAUDID) injection, lactulose, magnesium hydroxide, metoCLOPramide (REGLAN) injection, ondansetron **OR** ondansetron (ZOFRAN) IV, prochlorperazine, sodium chloride flush   Assessment: Active Problems:   Intractable nausea and vomiting   Anorexia   Abnormal loss of weight    Plan: Current therapy - family still in discussion concerning PEG - I will  follow.   LOS: 2 days   Drinda Butts 04/10/2016, 1:24 PM

## 2016-04-10 NOTE — Evaluation (Signed)
Physical Therapy Evaluation Patient Details Name: Francisco [name] MRN: [id number] DOB: Nov 28, 1945 Today's Date: 04/10/2016   History of Present Illness  70 y/o male here with nausea and vomiting.  Until recently he was able to be active, walk "normally," etc.  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   Clinical Impression  Pt is slow, guarded and cautious with mobility and ambulation but ultimately was able to do most mobility with just extra time and cuing.  He apparently is able to walk quickly and be relatively active but overall he should be safe to go home with family assist.  Pt is weak but with extra time should be safe and capable with most mobility/ADLs situations.    Follow Up Recommendations Home health PT    Equipment Recommendations  Rolling walker with 5" wheels (unsure if pt has one already)    Recommendations for Other Services       Precautions / Restrictions Precautions Precautions: Fall Restrictions Weight Bearing Restrictions: No      Mobility  Bed Mobility Overal bed mobility: Modified Independent             General bed mobility comments: Pt able to get his LEs back into bed w/o assist, slow and guarded with the effort  Transfers Overall transfer level: Modified independent Equipment used: Rolling walker (2 wheeled)             General transfer comment: Pt was able to rise to standing and control descent with slow, cautious effort.   Ambulation/Gait Ambulation/Gait assistance: Min guard Ambulation Distance (Feet): 50 Feet Assistive device: Rolling walker (2 wheeled)       General Gait Details: Pt again with slow, cautious cadance/gait but no safety concens.  He normally does not need AD and walks much faster but should be safe to get around the home w/ AD.   Stairs            Wheelchair Mobility    Modified Rankin (Stroke Patients Only)       Balance Overall balance assessment: Modified  Independent                                           Pertinent Vitals/Pain Pain Assessment:  (reports general moderate pain in back and abdomen)    Home Living Family/patient expects to be discharged to:: Private residence Living Arrangements: Spouse/significant other Available Help at Discharge: Family (family is temporarily her from out of town)             Additional Comments: unsure if he has a walker, he does not normally need one    Prior Function Level of Independence: Independent         Comments: apparently he was able to walk as much as needed, be active out of the house, etc     Hand Dominance        Extremity/Trunk Assessment   Upper Extremity Assessment: Generalized weakness (shows slow, weak effort with shoulder elevation and mobility)           Lower Extremity Assessment: Generalized weakness (functional, but weak, LE AROM t/o LEs)         Communication      Cognition Arousal/Alertness: Lethargic Behavior During Therapy: Flat affect Overall Cognitive Status: Within Functional Limits for tasks assessed  General Comments      Exercises     Assessment/Plan    PT Assessment Patient needs continued PT services  PT Problem List Decreased strength;Decreased activity tolerance;Decreased balance;Decreased mobility;Cardiopulmonary status limiting activity;Pain          PT Treatment Interventions Gait training;DME instruction;Stair training;Functional mobility training;Therapeutic activities;Therapeutic exercise;Balance training;Neuromuscular re-education;Patient/family education    PT Goals (Current goals can be found in the Care Plan section)  Acute Rehab PT Goals Patient Stated Goal: go home PT Goal Formulation: With patient Time For Goal Achievement: 04/24/16 Potential to Achieve Goals: Fair    Frequency Min 2X/week   Barriers to discharge        Co-evaluation                End of Session Equipment Utilized During Treatment: Gait belt Activity Tolerance: Patient limited by fatigue Patient left: with bed alarm set;with call bell/phone within reach;with family/visitor present           Time: 0832-0850 PT Time Calculation (min) (ACUTE ONLY): 18 min   Charges:   PT Evaluation $PT Eval Low Complexity: 1 Procedure     PT G CodesKreg Shropshire, DPT 04/10/2016, 1:09 PM

## 2016-04-11 ENCOUNTER — Ambulatory Visit
Admit: 2016-04-11 | Discharge: 2016-04-11 | Disposition: A | Discharge status: Home / Self Care | Payer: Medicare Other | Attending: Radiation Oncology | Admitting: Radiation Oncology

## 2016-04-11 ENCOUNTER — Ambulatory Visit: Payer: Medicare Other

## 2016-04-11 ENCOUNTER — Inpatient Hospital Stay: Payer: Medicare Other

## 2016-04-11 DIAGNOSIS — E786 Lipoprotein deficiency: Secondary | ICD-10-CM

## 2016-04-11 DIAGNOSIS — K7689 Other specified diseases of liver: Secondary | ICD-10-CM

## 2016-04-11 DIAGNOSIS — R112 Nausea with vomiting, unspecified: Secondary | ICD-10-CM | POA: Diagnosis not present

## 2016-04-11 DIAGNOSIS — M549 Dorsalgia, unspecified: Secondary | ICD-10-CM | POA: Diagnosis not present

## 2016-04-11 DIAGNOSIS — R0602 Shortness of breath: Secondary | ICD-10-CM | POA: Diagnosis not present

## 2016-04-11 DIAGNOSIS — Z79899 Other long term (current) drug therapy: Secondary | ICD-10-CM | POA: Diagnosis not present

## 2016-04-11 DIAGNOSIS — C801 Malignant (primary) neoplasm, unspecified: Secondary | ICD-10-CM | POA: Diagnosis not present

## 2016-04-11 DIAGNOSIS — Z8613 Personal history of malaria: Secondary | ICD-10-CM | POA: Diagnosis not present

## 2016-04-11 DIAGNOSIS — Z8611 Personal history of tuberculosis: Secondary | ICD-10-CM | POA: Diagnosis not present

## 2016-04-11 DIAGNOSIS — C649 Malignant neoplasm of unspecified kidney, except renal pelvis: Secondary | ICD-10-CM | POA: Diagnosis not present

## 2016-04-11 DIAGNOSIS — K2900 Acute gastritis without bleeding: Secondary | ICD-10-CM | POA: Diagnosis not present

## 2016-04-11 DIAGNOSIS — K769 Liver disease, unspecified: Secondary | ICD-10-CM | POA: Diagnosis not present

## 2016-04-11 DIAGNOSIS — C9000 Multiple myeloma not having achieved remission: Secondary | ICD-10-CM | POA: Diagnosis not present

## 2016-04-11 DIAGNOSIS — D696 Thrombocytopenia, unspecified: Secondary | ICD-10-CM | POA: Diagnosis not present

## 2016-04-11 DIAGNOSIS — Z510 Encounter for antineoplastic radiation therapy: Secondary | ICD-10-CM | POA: Diagnosis not present

## 2016-04-11 DIAGNOSIS — C7951 Secondary malignant neoplasm of bone: Secondary | ICD-10-CM | POA: Diagnosis not present

## 2016-04-11 LAB — BASIC METABOLIC PANEL
Anion gap: 4 — ABNORMAL LOW (ref 5–15)
BUN: 17 mg/dL (ref 6–20)
CALCIUM: 7.1 mg/dL — AB (ref 8.9–10.3)
CO2: 23 mmol/L (ref 22–32)
CREATININE: 0.78 mg/dL (ref 0.61–1.24)
Chloride: 114 mmol/L — ABNORMAL HIGH (ref 101–111)
GFR calc Af Amer: >60 mL/min (ref >60)
GLUCOSE: 121 mg/dL — AB (ref 65–99)
POTASSIUM: 2.7 mmol/L — AB (ref 3.5–5.1)
SODIUM: 141 mmol/L (ref 135–145)

## 2016-04-11 LAB — PHOSPHORUS: Phosphorus: 1.1 mg/dL — ABNORMAL LOW (ref 2.5–4.6)

## 2016-04-11 LAB — CBC
HEMATOCRIT: 30.9 % — AB (ref 40.0–52.0)
Hemoglobin: 10.7 g/dL — ABNORMAL LOW (ref 13.0–18.0)
MCH: 28.9 pg (ref 26.0–34.0)
MCHC: 34.7 g/dL (ref 32.0–36.0)
MCV: 83.4 fL (ref 80.0–100.0)
PLATELETS: 118 10*3/uL — AB (ref 150–440)
RBC: 3.7 MIL/uL — AB (ref 4.40–5.90)
RDW: 17.1 % — ABNORMAL HIGH (ref 11.5–14.5)
WBC: 3.2 10*3/uL — ABNORMAL LOW (ref 3.8–10.6)

## 2016-04-11 LAB — MAGNESIUM: MAGNESIUM: 2.2 mg/dL (ref 1.7–2.4)

## 2016-04-11 LAB — GLUCOSE, CAPILLARY: Glucose-Capillary: 114 mg/dL — ABNORMAL HIGH (ref 65–99)

## 2016-04-11 MED ORDER — FAT EMULSION 20 % IV EMUL
240.0000 mL | INTRAVENOUS | Status: AC
  Administered 2016-04-11: 240 mL via INTRAVENOUS
  Filled 2016-04-11: qty 250

## 2016-04-11 MED ORDER — TRACE MINERALS CR-CU-MN-SE-ZN 10-1000-500-60 MCG/ML IV SOLN
INTRAVENOUS | Status: AC
  Administered 2016-04-11: 17:00:00 via INTRAVENOUS
  Filled 2016-04-11: qty 720

## 2016-04-11 MED ORDER — INSULIN ASPART 100 UNIT/ML ~~LOC~~ SOLN
0.0000 [IU] | Freq: Four times a day (QID) | SUBCUTANEOUS | Status: DC
  Filled 2016-04-11: qty 1

## 2016-04-11 MED ORDER — FAMOTIDINE IN NACL 20-0.9 MG/50ML-% IV SOLN
20.0000 mg | Freq: Two times a day (BID) | INTRAVENOUS | Status: DC
  Administered 2016-04-11 – 2016-04-13 (×4): 20 mg via INTRAVENOUS
  Filled 2016-04-11 (×6): qty 50

## 2016-04-11 MED ORDER — POTASSIUM PHOSPHATES 15 MMOLE/5ML IV SOLN
25.0000 mmol | Freq: Once | INTRAVENOUS | Status: AC
  Administered 2016-04-11: 17:00:00 25 mmol via INTRAVENOUS
  Filled 2016-04-11: qty 8.33

## 2016-04-11 MED ORDER — POTASSIUM CHLORIDE 10 MEQ/100ML IV SOLN
10.0000 meq | INTRAVENOUS | Status: DC
  Filled 2016-04-11 (×4): qty 100

## 2016-04-11 NOTE — Progress Notes (Signed)
Reported to Dr Vianne Bulls potassium 2.7 and phophorus 1.1. MD acknowledged, no new orders given at this time.

## 2016-04-11 NOTE — Progress Notes (Signed)
PARENTERAL NUTRITION CONSULT NOTE - INITIAL  Pharmacy Consult for electrolytes and BG monitoring with TPN Indication: New TPN  No Known Allergies  Patient Measurements: Height: _0  (177.8 cm) Weight: 94 lb 11.2 oz (43 kg) IBW/kg (Calculated) : 73 Actual body weight is < ideal body weight  Vital Signs: Temp: 98.7 F (37.1 C) (10/02 1300) Temp Source: Oral (10/02 1300) BP: 118/78 (10/02 1300) Pulse Rate: 83 (10/02 1300) Intake/Output from previous day: 10/01 0701 - 10/02 0700 In: 1470.2 [P.O.:120; I.V.:1350.2] Out: -  Intake/Output from this shift: No intake/output data recorded.  Labs:  Recent Labs  04/08/16 1946 04/09/16 0743 04/11/16 1430  WBC 3.9 3.7* 3.2*  HGB 7.8* 10.8* 10.7*  HCT 23.0* 31.7* 30.9*  PLT 64* 69* 118*     Recent Labs  04/09/16 0743 04/11/16 1430  NA 140 141  K 2.8* 2.7*  CL 113* 114*  CO2 21* 23  GLUCOSE 98 121*  BUN 15 17  CREATININE 0.85 0.78  CALCIUM 7.1* 7.1*  MG  --  2.2  PHOS  --  1.1*   Estimated Creatinine Clearance: 51.5 mL/min (by C-G formula based on SCr of 0.78 mg/dL).   No results for input(s): GLUCAP in the last 72 hours.  Medical History: Past Medical History:  Diagnosis Date  . Anemia   . Cancer (HCC)    Bone metastasis  . Constipation   . Hearing loss   . Hoarse voice quality   . Liver lesion   . Malaria 2003  . Multiple myeloma ([place]) 03/22/2016   Per Dr. Rogue Bussing on his PET order.  . Tuberculosis 1985    Medications:  Scheduled:  . acetaminophen  650 mg Oral Once  . acyclovir  400 mg Oral BID  . docusate sodium  100 mg Oral BID  . dronabinol  2.5 mg Oral BID AC  . fentaNYL  12.5 mcg Transdermal Q72H  . mouth rinse  15 mL Mouth Rinse BID  . pantoprazole (PROTONIX) IV  40 mg Intravenous Q12H  . potassium phosphate IVPB (mmol)  25 mmol Intravenous Once  . sertraline  25 mg Oral Daily  . sodium chloride flush  3 mL Intravenous Q12H  . sucralfate  1 g Oral TID    Insulin Requirements in the  past 24 hours:  Patient has refused all labs in the past 24 hours and refused PO medications this morning.   Random BG this afternoon = 121  Current Nutrition:  Beginning Clinimix E 5/15 @ 30 mL/hr via PICC line today. MVI and multitrace added to TPN.  Order 20% lipids @ 10 mL/hr for 24 hours  Per dietician, patient is at risk for refeeding syndrome - will monitor electrolytes.  Assessment: Patient agreed to lab draw prior to beginning TPN: K = 2.7 Mg = 2.2 Phos = 1.1  Plan:  Order potassium phosphate 25 mmol IV now Will recheck K/Phos/Mg with AM labs tomorrow and replace as needed  Ordered CBG check and SSI q6h beginning at 1800 this evening per protocol   Lenis Noon, PharmD Clinical Pharmacist 04/11/2016,3:43 PM

## 2016-04-11 NOTE — Progress Notes (Signed)
PT Cancellation Note  Patient Details Name: Ashby Kapala MRN: [id number] DOB: 01/07/1946   Cancelled Treatment:    Reason Eval/Treat Not Completed: Patient declined, no reason specified;Other (comment), Treatment attempted; pt refused. Pt reports not feeling well today; family member states he continues to feel nauseated/sick. Re attempt at a later time/date as the schedule allows.    Larae Grooms, PTA 04/11/2016, 12:31 PM

## 2016-04-11 NOTE — Progress Notes (Signed)
    Subjective: More comfortable and responsive today. Still low oral intake.The family does not want to proceed with PEG at this time.     Objective: Vital signs in last 24 hours: Vitals:   04/10/16 1510 04/10/16 2050 04/11/16 0500 04/11/16 0518  BP: 125/75 112/65  117/73  Pulse: 77 69  80  Resp: 16 20  (!) 26  Temp: 98.8 F (37.1 C) 98.3 F (36.8 C)  98.4 F (36.9 C)  TempSrc: Oral Oral  Oral  SpO2: 99% 100%  99%  Weight:   43 kg (94 lb 11.2 oz)   Height:       Weight change: -3.629 kg (-8 lb)  Intake/Output Summary (Last 24 hours) at 04/11/16 1313 Last data filed at 04/11/16 0400  Gross per 24 hour  Intake          1350.17 ml  Output                0 ml  Net          1350.17 ml     Exam: Heart:: Regular rate and rhythm Lungs: clear to auscultation and percussion Abdomen: soft, nontender, normal bowel sounds   Lab Results: @LABTEST2 @ Micro Results: No results found for this or any previous visit (from the past 240 hour(s)). Studies/Results: No results found. Medications: I have reviewed the patient's current medications. Scheduled Meds: . acetaminophen  650 mg Oral Once  . acyclovir  400 mg Oral BID  . docusate sodium  100 mg Oral BID  . dronabinol  2.5 mg Oral BID AC  . fentaNYL  12.5 mcg Transdermal Q72H  . mouth rinse  15 mL Mouth Rinse BID  . pantoprazole (PROTONIX) IV  40 mg Intravenous Q12H  . sertraline  25 mg Oral Daily  . sodium chloride flush  3 mL Intravenous Q12H  . sucralfate  1 g Oral TID   Continuous Infusions: . sodium chloride 50 mL/hr at 04/10/16 2053   PRN Meds:.acetaminophen **OR** acetaminophen, alum & mag hydroxide-simeth, HYDROmorphone (DILAUDID) injection, lactulose, magnesium hydroxide, metoCLOPramide (REGLAN) injection, ondansetron **OR** ondansetron (ZOFRAN) IV, prochlorperazine, sodium chloride flush   Assessment: Active Problems:   Intractable vomiting with nausea   Anorexia   Abnormal loss of  weight    Plan: Current therapy - I will follow.   LOS: 3 days   Drinda Butts 04/11/2016, 1:13 PM

## 2016-04-11 NOTE — Progress Notes (Signed)
[place] NOTE  Patient Care Team: Alfonse Flavors, MD as PCP - General (Family Medicine)  CHIEF COMPLAINTS/PURPOSE OF CONSULTATION:  Persistent nausea vomiting/multiple myeloma  CC; nausea vomiting/poor by mouth intake continued- slight improvement" as per family Hicupps; continues to have poor appetite /poor by mouth intake. Feels weak . No fever no chills. No bleeding. He has been recommended PEG tube by GI. Complains of loose stools this morning.  MEDICAL HISTORY:  Past Medical History:  Diagnosis Date  . Anemia   . Cancer (HCC)    Bone metastasis  . Constipation   . Hearing loss   . Hoarse voice quality   . Liver lesion   . Malaria 2003  . Multiple myeloma ([place]) 03/22/2016   Per Dr. Rogue Bussing on his PET order.  . Tuberculosis 1985    SURGICAL HISTORY: Past Surgical History:  Procedure Laterality Date  . RETINAL DETACHMENT SURGERY  2006    SOCIAL HISTORY: Lives in [place]. No smoking or alcohol. he teaches yoga. Social History   Social History  . Marital status: Married    Spouse name: N/A  . Number of children: N/A  . Years of education: N/A   Occupational History  . Not on file.   Social History Main Topics  . Smoking status: Never Smoker  . Smokeless tobacco: Never Used  . Alcohol use No  . Drug use: No  . Sexual activity: Not on file   Other Topics Concern  . Not on file   Social History Narrative  . No narrative on file    FAMILY HISTORY: History reviewed. No pertinent family history.  ALLERGIES:  has No Known Allergies.  MEDICATIONS:  Current Facility-Administered Medications  Medication Dose Route Frequency Provider Last Rate Last Dose  . 0.9 %  sodium chloride infusion   Intravenous Continuous [name], MD 50 mL/hr at 04/10/16 2053    . acetaminophen (TYLENOL) tablet 650 mg  650 mg Oral Q6H PRN Harrie Foreman, MD       Or  . acetaminophen (TYLENOL) suppository 650 mg  650 mg Rectal Q6H PRN  Harrie Foreman, MD      . acetaminophen (TYLENOL) tablet 650 mg  650 mg Oral Once Cammie Sickle, MD      . acyclovir (ZOVIRAX) 200 MG capsule 400 mg  400 mg Oral BID Harrie Foreman, MD   400 mg at 04/10/16 0726  . alum & mag hydroxide-simeth (MAALOX/MYLANTA) 200-200-20 MG/5ML suspension 30 mL  30 mL Oral Q4H PRN Cammie Sickle, MD      . docusate sodium (COLACE) capsule 100 mg  100 mg Oral BID Harrie Foreman, MD   100 mg at 04/10/16 1037  . dronabinol (MARINOL) capsule 2.5 mg  2.5 mg Oral BID AC Harrie Foreman, MD   2.5 mg at 04/09/16 1700  . famotidine (PEPCID) IVPB 20 mg premix  20 mg Intravenous Q12H [name], MD      . TPN (CLINIMIX-E) Adult   Intravenous Continuous TPN Lenis Noon, RPH 30 mL/hr at 04/11/16 1706     And  . fat emulsion 20 % infusion 240 mL  240 mL Intravenous Continuous TPN Lenis Noon, RPH 10 mL/hr at 04/11/16 1706 240 mL at 04/11/16 1706  . fentaNYL (DURAGESIC - dosed mcg/hr) 12.5 mcg  12.5 mcg Transdermal Q72H [name], MD   12.5 mcg at 04/09/16 1105  . HYDROmorphone (DILAUDID) injection 1 mg  1 mg Intravenous Q4H  PRN [name], MD   1 mg at 04/11/16 1204  . insulin aspart (novoLOG) injection 0-9 Units  0-9 Units Subcutaneous Q6H Darylene Price Swayne, RPH      . lactulose (CHRONULAC) 10 GM/15ML solution 20 g  20 g Oral BID PRN [name], MD   20 g at 04/10/16 1704  . magnesium hydroxide (MILK OF MAGNESIA) suspension 5 mL  5 mL Oral Daily PRN Harrie Foreman, MD   5 mL at 04/09/16 0908  . MEDLINE mouth rinse  15 mL Mouth Rinse BID [name], MD   15 mL at 04/10/16 0730  . metoCLOPramide (REGLAN) injection 5 mg  5 mg Intravenous Q8H PRN [name], MD   5 mg at 04/06/16 1701  . ondansetron (ZOFRAN) tablet 4 mg  4 mg Oral Q6H PRN Harrie Foreman, MD       Or  . ondansetron [facility]) injection 4 mg  4 mg Intravenous Q6H PRN Harrie Foreman, MD   4 mg at 04/09/16 2109  . potassium phosphate  25 mmol in dextrose 5 % 500 mL infusion  25 mmol Intravenous Once Lenis Noon, RPH   25 mmol at 04/11/16 1705  . prochlorperazine (COMPAZINE) injection 10 mg  10 mg Intravenous Q6H PRN [name], MD   10 mg at 04/08/16 1253  . sertraline (ZOLOFT) tablet 25 mg  25 mg Oral Daily [name], MD   25 mg at 04/11/16 1204  . sodium chloride flush (NS) 0.9 % injection 3 mL  3 mL Intravenous Q12H [name], MD   3 mL at 04/11/16 1032  . sodium chloride flush (NS) 0.9 % injection 3 mL  3 mL Intravenous PRN [name], MD      . sucralfate (CARAFATE) tablet 1 g  1 g Oral TID Cammie Sickle, MD   1 g at 04/10/16 0725      .  PHYSICAL EXAMINATION:  Vitals:   04/11/16 0518 04/11/16 1300  BP: 117/73 118/78  Pulse: 80 83  Resp: (!) 26 (!) 22  Temp: 98.4 F (36.9 C) 98.7 F (37.1 C)   Filed Weights   04/09/16 0454 04/10/16 0516 04/11/16 0500  Weight: 99 lb 14.4 oz (45.3 kg) 102 lb 11.2 oz (46.6 kg) 94 lb 11.2 oz (43 kg)    GENERAL: Cachectic appearing; mild distress because of nausea and vomiting. Accompanied by his daughter and/family EYES: no pallor or icterus OROPHARYNX: no thrush or ulceration. NECK: supple, no masses felt LYMPH:  no palpable lymphadenopathy in the cervical, axillary or inguinal regions LUNGS: decreased breath sounds to auscultation at bases and  No wheeze or crackles HEART/CVS: regular rate & rhythm and no murmurs; No lower extremity edema ABDOMEN: abdomen soft, non-tender and normal bowel sounds Musculoskeletal:no cyanosis of digits and no clubbing  PSYCH: alert & oriented x 3 with fluent speech NEURO: no focal motor/sensory deficits SKIN:  no rashes or significant lesions  LABORATORY DATA:  I have reviewed the data as listed Lab Results  Component Value Date   WBC 3.2 (L) 04/11/2016   HGB 10.7 (L) 04/11/2016   HCT 30.9 (L) 04/11/2016   MCV 83.4 04/11/2016   PLT 118 (L) 04/11/2016    Recent Labs  03/22/16 0859   04/06/16 0014 04/07/16 0457 04/09/16 0743 04/11/16 1430  NA 129*  < > 138 139 140 141  K 4.5  < > 4.1 3.7 2.8* 2.7*  CL 99*  < > 113* 117* 113* 114*  CO2  25  < > 22 20* 21* 23  GLUCOSE 128*  < > 141* 82 98 121*  BUN 32*  < > 42* 22* 15 17  CREATININE 0.90  < > 1.15 0.87 0.85 0.78  CALCIUM 9.2  < > 8.4* 7.2* 7.1* 7.1*  GFRNONAA >60  < > >60 >60 >60 >60  GFRAA >60  < > >60 >60 >60 >60  PROT 8.1  --  7.8  --   --   --   ALBUMIN 3.2*  --  2.6*  --   --   --   AST 35  --  29  --   --   --   ALT 38  --  23  --   --   --   ALKPHOS 87  --  78  --   --   --   BILITOT 0.7  --  0.6  --   --   --   < > = values in this interval not displayed.  RADIOGRAPHIC STUDIES: I have personally reviewed the radiological images as listed and agreed with the findings in the report. Ct Chest W Contrast  Result Date: 03/15/2016 CLINICAL DATA:  Abnormal chest x-ray.  Terrible back pain. EXAM: CT CHEST WITH CONTRAST TECHNIQUE: Multidetector CT imaging of the chest was performed during intravenous contrast administration. CONTRAST:  54m ISOVUE-300 IOPAMIDOL (ISOVUE-300) INJECTION 61% COMPARISON:  Two-view chest x-ray 03/07/2016 FINDINGS: Cardiovascular: The heart size is normal. No significant coronary artery calcifications are present. There is no significant pericardial effusion. Mediastinum/Nodes: No significant mediastinal or axillary adenopathy is present. Atherosclerotic calcifications are present at the aortic arch. Lungs/Pleura: Biapical pleural parenchymal scarring is present. There is upward traction on the hila bilaterally. Emphysematous changes are noted. No focal nodule, mass, or airspace disease is evident. Upper Abdomen: Multiple hypodense lesions are present within the liver. The largest lesion is in segment 4 A measuring 14 mm. This may be a cyst. There are other more subtle hypodense lesions. Most worrisome is a far right lateral lesion in segment 7 on image 62 of series 2. This measures 15 mm. The  visualized abdominal organs are otherwise unremarkable. Musculoskeletal: A large soft tissue lesion is centered along the posterior left ninth rib measuring 5.7 x 3.3 x 4.5 cm. There is no significant invasion of the spinal canal although the lesion does extend to the left T8-9 foramen. There is tumor invasion and the posterior elements of T9 on the left. An additional more lateral left T9 lytic lesion and pathologic fracture is noted. Focal irregularity of the posterior left T12 rib the likely represents a metastatic lesion as well. The pathologic fractures present at T10 with extraosseous tumor extending into the spinal canal at T10 and to the right. There is tumor in both feet T9-10 and T10-11 foramen on the right. Marked osteopenia is present. There is lytic tumor involving the posterior elements on the right at T3 with probable involvement of the right T3-4 foramen. There is also a lytic lesion anteriorly within the T3 vertebral body without collapse. A far right T4 lesion measures 14 mm without collapse. In anterior lesion in T6 measures 14 mm. A 9 mm lesion is present within the right posterior element of T9. Infiltrative tumor is suspected posteriorly at T11 without collapse or extraosseous extension of tumor. Multiple other smaller spinous lesions are suspected. MRI of the thoracic spine without and with contrast would be useful for further evaluation. IMPRESSION: 1. Multiple lytic lesions throughout the thoracic  spine and ribs as described suggesting metastatic disease. 2. The lesion evident on the chest x-ray corresponds with a 5.7 x 3.3 x 4.5 cm mass infiltrating the left T9 rib with extension to the left T8-9 foramen but no invasion of the foramen. 3. 4.1 x 3.1 cm exophytic mass lesion at T10 with pathologic fracture. The tumor extends into the spinal canal with moderate central canal stenosis and right greater than left foraminal narrowing at T9-10 and T10-11. 4. Prominent infiltrative tumor  posteriorly at T11 without compression fracture. 5. Additional pathologic fracture within the more distal left ninth rib. 6. Marked osteopenia with probable hemangioma is an high suspicion for multiple additional lesions throughout the thoracic spine. Recommend MRI of the thoracic spine without and with contrast for further evaluation 7. Multiple hypodense lesions within the liver also raise concern for metastatic disease. Dedicated CT of the abdomen and pelvis with contrast is recommended for further evaluation of metastatic disease and possible primary source. 8. Emphysema and biapical scarring in the lungs without evidence for primary tumor. These results were called by telephone at the time of interpretation on 03/15/2016 at 11:22 am to [facility], [place], who verbally acknowledged these results. Electronically Signed   By: San Morelle M.D.   On: 03/15/2016 11:44   Nm Pet Image Initial (pi) Whole Body  Result Date: 03/29/2016 CLINICAL DATA:  Initial treatment strategy for multiple myeloma. PET-CT was attempted on 03/22/2016, however the PET portion of the study could not be completed due to patient discomfort. Status post radiation therapy and chemotherapy 1 day prior with steroids. EXAM: NUCLEAR MEDICINE PET WHOLE BODY TECHNIQUE: 12.8 mCi F-18 FDG was injected intravenously. Full-ring PET imaging was performed from the vertex to the feet after the radiotracer. CT data was obtained and used for attenuation correction and anatomic localization. FASTING BLOOD GLUCOSE:  Value:  119 mg/dl COMPARISON:  03/22/2016 incomplete PET-CT and 03/15/2016 chest CT. FINDINGS: Head/Neck: No hypermetabolic lymph nodes in the neck. Chest: No hypermetabolic axillary, mediastinal or hilar nodes. Top-normal heart size. Atherosclerotic nonaneurysmal thoracic aorta. No pleural effusions. There are 4 mm pulmonary nodules in the anterior right upper lobe (series 3/image 96) and right middle lobe (series 3/image 147), below PET  resolution. No acute consolidative airspace disease. Abdomen/Pelvis: No abnormal hypermetabolic activity within the liver, pancreas, adrenal glands, or spleen. No hypermetabolic lymph nodes in the abdomen or pelvis. There are 2 simple right liver cysts, largest 1.4 cm. Atherosclerotic nonaneurysmal abdominal aorta. Skeleton: There are numerous lytic bone lesions with associated mild hypermetabolism throughout the spine, ribs and pelvis, several of which are expansile liver extraosseous soft tissue components. For example an expansile 5.1 x 3.1 cm posterior left ninth rib lytic bone lesion with max SUV 3.1 (series 3/ image 130), a right T10 expansile 4.3 x 3.3 cm lytic bone lesion with max SUV 2.9 and associated pathologic T10 compression fracture, and a right upper sacral 2.7 x 2.0 cm mildly expansile lytic bone lesion (series 3/ image 217) with adjacent pathologic vertical right sacral ala fracture with max SUV 3.0. Extremities: No hypermetabolic activity to suggest metastasis. FDG uptake in the left antecubital fossa/left forearm is injection related. IMPRESSION: 1. Mildly hypermetabolic lytic bone lesions throughout the axial skeleton, several of which are expansile with extra-osseous soft tissue components, consistent with multiple myeloma. Pathologic fractures in the T10 vertebral body and right sacral ala. 2. Two 4 mm right pulmonary nodules, below PET resolution. A follow-up unenhanced chest CT could be obtained in 3-6 months. 3.  Aortic atherosclerosis. Electronically Signed   By: Ilona Sorrel M.D.   On: 03/29/2016 10:25   Nm Pet Image Initial (pi) Whole Body  Result Date: 03/22/2016 CLINICAL DATA:  Subsequent treatment strategy for multiple myeloma EXAM: NUCLEAR MEDICINE PET WHOLE BODY TECHNIQUE: 14.2 mCi F-18 FDG was injected intravenously. CT data was obtained and used for attenuation correction and anatomic localization only. (This was not acquired as a diagnostic CT examination.) Additional exam  technical data entered on technologist worksheet. FASTING BLOOD GLUCOSE:  Value: 105 mg/dl COMPARISON:  Chest CT 03/15/2016. FINDINGS: Comment: Per report from the technologist, the patient was in considerable pain during the examination. Despite efforts to maintain an acceptable level of comfort for the patient, the patient was unable to tolerate the entire examination. CT images were obtained, but PET images were discontinued after acquisition through only the head. PET images through the neck, chest, abdomen, pelvis and lower extremities were not obtained. Accordingly, the examination will be interpreted as a noncontrast CT of the head, neck, chest, abdomen, pelvis, skeleton and extremities. Head/Neck: No definite intracranial mass. No hydrocephalus. No aggressive appearing osseous lesions are noted in the skull. No definite cervical lymphadenopathy noted on the noncontrast CT images. Chest: Heart size is normal. There is no significant pericardial fluid, thickening or pericardial calcification. Aortic atherosclerosis, without definite aneurysm. No definite lymphadenopathy noted in the mediastinal or hilar nodal stations. Please note that accurate exclusion of hilar adenopathy is limited on noncontrast CT scans. Esophagus is unremarkable in appearance. No axillary lymphadenopathy. Mass effect upon the left lower lobe (discussed below) related to expansile left ninth rib lesion. No suspicious appearing pulmonary nodules or masses. Mild bilateral apical nodular pleuroparenchymal thickening, most compatible with chronic post infectious or inflammatory scarring. No acute consolidative airspace disease. No pleural effusions. Abdomen/Pelvis: Several low-attenuation lesions are again noted in the liver, largest of which is in the central aspect of segment 8 measuring 1.4 x 0.9 cm. These are incompletely characterized on today's noncontrast CT examination, but are favored to represent tiny cysts. Gallbladder is poorly  visualized and may be decompressed or surgically absent (no surgical clips are noted in the gallbladder fossa). Unenhanced appearance of the pancreas, spleen, bilateral adrenal glands and bilateral kidneys is unremarkable. No hydroureteronephrosis. Unenhanced appearance of the urinary bladder is normal. No pathologic dilatation of small bowel or colon. Skeleton/Extremities: Again noted are expansile lytic lesions involving the left ninth rib and T10 vertebral body where there is a previously recognized pathologic compression fracture. The lesion of the left ninth rib currently measures approximately 3.2 x 6.1 cm (image 130 of series 3) which is similar to the recent chest CT. Lesion in the right side of the T10 vertebral body extends into the adjacent paraspinal soft tissues and currently measures 3.4 x 3.5 cm, also similar to the prior examination. Large lytic lesion in the anterior aspect of the L4 vertebral body measuring 3.2 x 3.1 cm. Large lytic lesion in the right side of the sacrum just medial to the sacroiliac joint measuring 1.9 x 2.3 cm. Lytic lesion in the right ilium measuring 2.9 x 1.4 cm. Several other smaller lytic lesions are also noted. IMPRESSION: 1. Widespread lytic lesions throughout the visualized axial and appendicular skeleton, compatible with the reported clinical history of multiple myeloma. This is most notable for the lytic lesion with pathologic compression fracture in the T10 vertebral body. No definite extra-skeletal involvement noted on today's examination, which was limited by lack of acquisition of diagnostic PET images secondary to  patient pain during the examination. 2. Aortic atherosclerosis. 3. Additional incidental findings, as above. Electronically Signed   By: Vinnie Langton M.D.   On: 03/22/2016 14:41   Ct Biopsy  Result Date: 03/25/2016 INDICATION: Multiple bony lytic lesions.  Possible multiple myeloma. EXAM: CT BIOPSY MEDICATIONS: None. ANESTHESIA/SEDATION: Moderate  (conscious) sedation was employed during this procedure. A total of Versed 1.0 mg and Fentanyl 50 mcg was administered intravenously. Moderate Sedation Time: 18 minutes. The patient's level of consciousness and vital signs were monitored continuously by radiology nursing throughout the procedure under my direct supervision. COMPLICATIONS: None immediate. PROCEDURE: Informed written consent was obtained from the patient after a thorough discussion of the procedural risks, benefits and alternatives. All questions were addressed. Maximal Sterile Barrier Technique was utilized including mask, sterile gloves, sterile drape, hand hygiene and skin antiseptic. A timeout was performed prior to the initiation of the procedure. The patient was placed prone on the CT scanner. Posterior flank region was prepped and draped using sterile technique. Local anesthetic was applied. Under CT guidance, 22 gauge spinal needle is directed toward the posterior margin of the right iliac bone. Lidocaine was injected subperiosteally. Needle was removed. Then, also under CT guidance, trocar was directed through posterior cortex of right iliac bone. Bone marrow aspirations were obtained with and without heparin, and given to the pathology technologist who was present at that time. Then, bone marrow core biopsy was obtained, and this sample was also given to the pathology technologist. Needle was removed and appropriate dressing was applied. IMPRESSION: Under CT guidance, percutaneous bone marrow aspiration and core biopsy of right iliac bone was performed. Electronically Signed   By: Marijo Conception, M.D.   On: 03/25/2016 10:14   Dg Chest Port 1 View  Result Date: 04/11/2016 CLINICAL DATA:  Status post PICC placement. EXAM: PORTABLE CHEST 1 VIEW COMPARISON:  04/06/2016. FINDINGS: Mildly enlarged cardiac silhouette with an interval mild decrease in size. Right PICC tip in the region of the upper right atrium. Small amount of linear density  in the left lower lobe. Previously noted T 90 degrees 10 vertebral compression fracture. Possible additional vertebral compression fractures, difficult to assess in the frontal projection. Diffuse osteopenia. Mild scoliosis. IMPRESSION: 1. Right PICC tip in the upper right atrium. This could be retracted approximately 6 mm to place it at the superior cavoatrial junction. 2. Small amount of linear atelectasis and probable scarring in the left lower lobe. 3. Mild cardiomegaly with improvement. 4. T10 and possible additional thoracic vertebral compression deformities. Electronically Signed   By: Claudie Revering M.D.   On: 04/11/2016 15:49   Dg Chest Portable 1 View  Result Date: 04/06/2016 CLINICAL DATA:  Cough, dyspnea. History of multiple myeloma, T10 pathologic fracture. EXAM: PORTABLE CHEST 1 VIEW COMPARISON:  PET-CT March 29, 2016 FINDINGS: Cardiac silhouette is moderately enlarged, mediastinal silhouette is nonsuspicious, mildly calcified aortic knob. Increased lung volumes. No pleural effusion or focal consolidation. No pneumothorax. Apical pleural thickening. Osteopenia. Cachexia. Bold severe T10 compression fracture. IMPRESSION: Moderate cardiomegaly.  Increased lung volumes. Osteopenia with severe T10 fracture corresponding known pathologic fracture. Electronically Signed   By: Elon Alas M.D.   On: 04/06/2016 03:07   Dg Chest Port 1v Same Day  Result Date: 04/11/2016 CLINICAL DATA:  PICC line adjustment EXAM: PORTABLE CHEST 1 VIEW COMPARISON:  Portable exam 1636 hours compared to 1527 hours FINDINGS: Tip of RIGHT arm PICC line now projects over SVC above cavoatrial junction. Enlargement of cardiac silhouette. Atherosclerotic calcification aorta.  Pulmonary vascularity normal. Lungs appear unchanged, with atelectasis versus scarring at LEFT base and question underlying emphysematous changes. No pleural effusion or pneumothorax. Bones demineralized. IMPRESSION: Tip of RIGHT arm PICC line now  projects over SVC. Enlargement of cardiac silhouette with suspected emphysematous changes and LEFT lower lobe atelectasis versus scarring. Electronically Signed   By: Lavonia Dana M.D.   On: 04/11/2016 16:54   Dg Bone Survey Met  Result Date: 03/16/2016 CLINICAL DATA:  Multiple myeloma EXAM: METASTATIC BONE SURVEY COMPARISON:  03/15/2016 FINDINGS: Lateral view of the skull shows no lytic or blastic lesion. Lateral view of the cervical spine shows diffuse osteopenia. No lytic or blastic lesions. Mild disc space flattening at C3-C4-C4-C5 and C5-C6 level. Frontal view bilateral shoulder shows no lytic or blastic lesion. Frontal view of the neck shows no lytic or blastic lesion. Bilateral humerus demonstrates no lytic or blastic lesion. Bilateral forearm shows no lytic or blastic lesions. There is dextroscoliosis lower thoracic spine. Mild levoscoliosis lumbar spine. Significant compression fracture of T10 vertebral body suspicious for pathologic fracture. Expansile lesion of the left eighth rib suspicious for metastatic disease. Frontal view of the pelvis shows no lytic or blastic lesions. Frontal view bilateral hip and proximal femur is unremarkable. Frontal view bilateral femur shows no lytic or blastic lesions. Bilateral tibia and fibula shows no lytic or blastic lesions. IMPRESSION: 1. There is probable pathologic fracture of T10 vertebral body suspicious for bony lesion. Expansile lesion of left posterior eighth rib suspicious for metastatic disease. Please see patient's CT scan of the chest from yesterday. Electronically Signed   By: Lahoma Crocker M.D.   On: 03/16/2016 17:00    ASSESSMENT & PLAN:   # 70 year old male patient with newly diagnosed multiple myeloma currently admitted to the hospital for intractable nausea and vomiting/ poor by mouth intake.  # Intractable nausea vomiting/Hicupps/likely secondary to gastritis-currently off Protonix. Slight improvement noted. Continue PPI  # Poor by mouth  intake. Nutrition/cachexia- patient/family not interested in PEG tube. Recommend TPN. Interested. We had a PICC line.  # Anemia/thrombocytopenia- and due to multiple myeloma; status post terms of transfusion hemoglobin stable around 9-10. Platelets 60 status 70s  # Multiple myeloma/anemia mild thrombocytopenia- newly diagnosed on Velcade and dexamethasone; currently treatment on hold.   # Severe hypokalemia potassium 2.7; supplementation ordered.  # Pain from malignancy- continue hydrocodone as needed.  Discussed with Dr.Konidena. Discussed with the patient's daughter- awaiting son's [[name]] arrival from [place] tomorrow- I will contact him tomorrow- [phone].    Cammie Sickle, MD 04/11/2016 5:54 PM

## 2016-04-11 NOTE — Progress Notes (Signed)
Nutrition Follow-up  DOCUMENTATION CODES:   Severe malnutrition in context of chronic illness  INTERVENTION:  Recommend Begin Clinmix E 5/15 @ 20m/hr via PICC, goal rate of 642mhr Recommend 20% lipids @ 1053mr for 24hr Provides 1502 calories, 72gm protein, 48g fat Recommend monitor K, Phos, Mg, replete as necessary for at least 3 days, patient is at high risk of refeeding Continue Magic cup TID with meals, each supplement provides 290 kcal and 9 grams of protein  NUTRITION DIAGNOSIS:   Malnutrition related to chronic illness as evidenced by severe depletion of muscle mass, severe depletion of body fat, percent weight loss. -ongoing GOAL:   Patient will meet greater than or equal to 90% of their needs -not meeting MONITOR:   PO intake, I & O's, Supplement acceptance, Labs, Weight trends  REASON FOR ASSESSMENT:   Other (Comment) (Low BMI)    ASSESSMENT:   The patient with past medical history significant for multiple myeloma presents emergency department complaining of recurrent nausea and vomiting. The patient recently underwent chemotherapy and received intravenous fluid in clinic.  Continues with poor PO intake, intractable nausea/vomiting.  Pt was on clear liquids and then a regular diet but was unable to tolerate much of anything without vomiting.  He was refusing po medications this morning. Had a BM yesterday. Wt is down an additional 4 kgs since admit. Labs and medications reviewed: K 2.8 Marinol, Colace NS @ 61m14m Urine output not documented thus far.  Diet Order:  Diet regular Room service appropriate? Yes; Fluid consistency: Thin  Skin:  Reviewed, no issues  Last BM:  10/1  Height:   Ht Readings from Last 1 Encounters:  04/06/16 _0  (1.778 m)    Weight:   Wt Readings from Last 1 Encounters:  04/11/16 94 lb 11.2 oz (43 kg)    Ideal Body Weight:  75.45 kg  BMI:  Body mass index is 13.59 kg/m.  Estimated Nutritional Needs:   Kcal:   1400-1650 calories  Protein:  56-70 gm  Fluid:  >/= 1.4L  EDUCATION NEEDS:   No education needs identified at this time  WillSatira Anisrd, MS, RD LDN Inpatient Clinical Dietitian Pager [phone]

## 2016-04-11 NOTE — Progress Notes (Signed)
PT Cancellation Note  Patient Details Name: Francisco [name] MRN: [id number] DOB: 1945-09-30   Cancelled Treatment:    Reason Eval/Treat Not Completed: Other (comment). Treatment attempted this afternoon. Nursing staff in with pt under sterile conditions. Re attempt PT tomorrow.    Larae Grooms, PTA 04/11/2016, 2:59 PM

## 2016-04-11 NOTE — Plan of Care (Signed)
Problem: Fluid Volume: Goal: Ability to maintain a balanced intake and output will improve Outcome: Progressing Poor po intake, BM noted this shift.   Problem: Nutrition: Goal: Adequate nutrition will be maintained Outcome: Not Progressing Pt continues to c/o nausea. Pt refuses all PO medications. Poor po intake.

## 2016-04-11 NOTE — Progress Notes (Signed)
[facility] NAME: Francisco [name]    MR#:  [id number]  DATE OF BIRTH:  July 31, 1945  SUBJECTIVE;; seen today.little  Upbeat today. Still having trouble taking the  Liquids,  CHIEF COMPLAINT:   Chief Complaint  Patient presents with  . Emesis    REVIEW OF SYSTEMS:    Review of Systems  Constitutional: Positive for weight loss. Negative for chills and fever.  HENT: Negative for hearing loss.   Eyes: Negative for blurred vision, double vision and photophobia.  Respiratory: Negative for cough, hemoptysis and shortness of breath.   Cardiovascular: Negative for palpitations, orthopnea and leg swelling.  Gastrointestinal: Positive for heartburn, nausea and vomiting. Negative for abdominal pain and diarrhea.  Genitourinary: Negative for dysuria and urgency.  Musculoskeletal: Negative for myalgias and neck pain.  Skin: Negative for rash.  Neurological: Positive for weakness. Negative for dizziness, focal weakness, seizures and headaches.  Psychiatric/Behavioral: Negative for memory loss. The patient does not have insomnia.     Nutrition:  Tolerating Diet:no Tolerating PT:      DRUG ALLERGIES:  No Known Allergies  VITALS:  Blood pressure 117/73, pulse 80, temperature 98.4 F (36.9 C), temperature source Oral, resp. rate (!) 26, height 5' 10"  (1.778 m), weight 43 kg (94 lb 11.2 oz), SpO2 99 %.  PHYSICAL EXAMINATION:   Physical Exam  GENERAL:  70 y.o.-year-old patient lying in the bed with no acute distress.cachectic. EYES: Pupils equal, round, reactive to light and accommodation. No scleral icterus. Extraocular muscles intact.  HEENT: Head atraumatic, normocephalic. Oropharynx and nasopharynx clear.  NECK:  Supple, no jugular venous distention. No thyroid enlargement, no tenderness.  LUNGS: Normal breath sounds bilaterally, no wheezing, rales,rhonchi or crepitation. No use of accessory muscles of respiration.   CARDIOVASCULAR: S1, S2 normal. No murmurs, rubs, or gallops.  ABDOMEN: Soft, nontender, nondistended. Bowel sounds present. No organomegaly or mass.  EXTREMITIES: No pedal edema, cyanosis, or clubbing.  NEUROLOGIC: Cranial nerves II through XII are intact. Muscle strength 5/5 in all extremities. Sensation intact. Gait not checked.  PSYCHIATRIC: The patient is alert and oriented x 3.  SKIN: No obvious rash, lesion, or ulcer.    LABORATORY PANEL:   CBC  Recent Labs Lab 04/09/16 0743  WBC 3.7*  HGB 10.8*  HCT 31.7*  PLT 69*   ------------------------------------------------------------------------------------------------------------------  Chemistries   Recent Labs Lab 04/06/16 0014  04/09/16 0743  NA 138  < > 140  K 4.1  < > 2.8*  CL 113*  < > 113*  CO2 22  < > 21*  GLUCOSE 141*  < > 98  BUN 42*  < > 15  CREATININE 1.15  < > 0.85  CALCIUM 8.4*  < > 7.1*  MG 2.2  --   --   AST 29  --   --   ALT 23  --   --   ALKPHOS 78  --   --   BILITOT 0.6  --   --   < > = values in this interval not displayed. ------------------------------------------------------------------------------------------------------------------  Cardiac Enzymes No results for input(s): TROPONINI in the last 168 hours. ------------------------------------------------------------------------------------------------------------------  RADIOLOGY:  No results found.   ASSESSMENT AND PLAN:   Active Problems:   Intractable vomiting with nausea   Anorexia   Abnormal loss of weight   1. intractable nausea and vomiting likely secondary to acute gastritis from steroid use. Protonix drip, IV Zofran, IV Phenergan., IV hydration' Still not eating well continue  to have poor by mouth intake.  gastroenterology recommended. Placement. Discussed with daughter-in-law extensively, and told that  PEG   Placement will be permanent because of his severe malnutrition and poor by mouth intake .she  Is  requesting  family meeting on Tuesday morning with oncologist to discuss about his prognosis and further treatments for multiple myeloma.   #2 multiple myeloma with chronic bone pains; fentanyl patch , continue IV when necessary morphine. #3 severe malnutrition in the context of chronic illness: Continue Magic cups, Marinol ./ plan  to start TPN through a PICC line, likely discharge in next 2 days if she tolerates the tpn. Patient agreed for it discussed with family, oncology.   #4. 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  5.hoarseness of voice: ENT will follow the patient is an outpatient. #6 physical therapy consulted.Home health physical therapy.  #7 depression: Start on Zoloft 50 MG daily #8 hypokalemia replace the potassium patient is refusing the blood work d/w patient's daughter in Sports coach, Location manager. Spoke with DIL for about 30 min Discussed with Dr. Rogue Bussing ,RN  All the records are reviewed and case discussed with Care Management/Social Workerr. Management plans discussed with the patient, family and they are in agreement.  CODE STATUS: full  TOTAL TIME TAKING CARE OF THIS PATIENT: 35 minutes.   POSSIBLE D/C IN  1-2 DAYS, DEPENDING ON CLINICAL CONDITION.   [name] M.D on 04/11/2016 at 10:58 AM  Between 7am to 6pm - Pager - [phone]  After 6pm go to [email or web address] - password [email or web address] [facility]  Office  [phone]  CC: Primary care physician; Alfonse Flavors, MD

## 2016-04-12 ENCOUNTER — Telehealth: Payer: Self-pay | Admitting: Internal Medicine

## 2016-04-12 ENCOUNTER — Ambulatory Visit
Admit: 2016-04-12 | Discharge: 2016-04-12 | Disposition: A | Discharge status: Home / Self Care | Payer: Medicare Other | Attending: Radiation Oncology | Admitting: Radiation Oncology

## 2016-04-12 DIAGNOSIS — R112 Nausea with vomiting, unspecified: Secondary | ICD-10-CM | POA: Diagnosis not present

## 2016-04-12 DIAGNOSIS — Z510 Encounter for antineoplastic radiation therapy: Secondary | ICD-10-CM | POA: Diagnosis not present

## 2016-04-12 DIAGNOSIS — K2900 Acute gastritis without bleeding: Secondary | ICD-10-CM | POA: Diagnosis not present

## 2016-04-12 LAB — DIFFERENTIAL
BASOS PCT: 0 %
Basophils Absolute: 0 10*3/uL (ref 0–0.1)
EOS PCT: 1 %
Eosinophils Absolute: 0 10*3/uL (ref 0–0.7)
Lymphocytes Relative: 7 %
Lymphs Abs: 0.2 10*3/uL — ABNORMAL LOW (ref 1.0–3.6)
MONO ABS: 0.2 10*3/uL (ref 0.2–1.0)
MONOS PCT: 7 %
NEUTROS ABS: 2.9 10*3/uL (ref 1.4–6.5)
Neutrophils Relative %: 85 %

## 2016-04-12 LAB — GLUCOSE, CAPILLARY
Glucose-Capillary: 116 mg/dL — ABNORMAL HIGH (ref 65–99)
Glucose-Capillary: 123 mg/dL — ABNORMAL HIGH (ref 65–99)
Glucose-Capillary: 131 mg/dL — ABNORMAL HIGH (ref 65–99)
Glucose-Capillary: 139 mg/dL — ABNORMAL HIGH (ref 65–99)
Glucose-Capillary: 141 mg/dL — ABNORMAL HIGH (ref 65–99)
Glucose-Capillary: 147 mg/dL — ABNORMAL HIGH (ref 65–99)

## 2016-04-12 LAB — COMPREHENSIVE METABOLIC PANEL
ALBUMIN: 2.6 g/dL — AB (ref 3.5–5.0)
ALK PHOS: 76 U/L (ref 38–126)
ALT: 50 U/L (ref 17–63)
ANION GAP: 2 — AB (ref 5–15)
AST: 59 U/L — AB (ref 15–41)
BILIRUBIN TOTAL: 0.6 mg/dL (ref 0.3–1.2)
BUN: 13 mg/dL (ref 6–20)
CALCIUM: 6.6 mg/dL — AB (ref 8.9–10.3)
CO2: 26 mmol/L (ref 22–32)
Chloride: 111 mmol/L (ref 101–111)
Creatinine, Ser: 0.71 mg/dL (ref 0.61–1.24)
GFR calc Af Amer: >60 mL/min (ref >60)
GFR calc non Af Amer: >60 mL/min (ref >60)
GLUCOSE: 123 mg/dL — AB (ref 65–99)
Potassium: 2.8 mmol/L — ABNORMAL LOW (ref 3.5–5.1)
SODIUM: 139 mmol/L (ref 135–145)
TOTAL PROTEIN: 7 g/dL (ref 6.5–8.1)

## 2016-04-12 LAB — CBC
HEMATOCRIT: 30.4 % — AB (ref 40.0–52.0)
Hemoglobin: 10.7 g/dL — ABNORMAL LOW (ref 13.0–18.0)
MCH: 29.5 pg (ref 26.0–34.0)
MCHC: 35.1 g/dL (ref 32.0–36.0)
MCV: 84 fL (ref 80.0–100.0)
Platelets: 106 10*3/uL — ABNORMAL LOW (ref 150–440)
RBC: 3.62 MIL/uL — ABNORMAL LOW (ref 4.40–5.90)
RDW: 17.2 % — AB (ref 11.5–14.5)
WBC: 3.4 10*3/uL — AB (ref 3.8–10.6)

## 2016-04-12 LAB — TRIGLYCERIDES: Triglycerides: 49 mg/dL (ref <150)

## 2016-04-12 LAB — PREALBUMIN: PREALBUMIN: 9.2 mg/dL — AB (ref 18–38)

## 2016-04-12 LAB — MAGNESIUM: Magnesium: 2 mg/dL (ref 1.7–2.4)

## 2016-04-12 LAB — PHOSPHORUS: Phosphorus: 1.2 mg/dL — ABNORMAL LOW (ref 2.5–4.6)

## 2016-04-12 MED ORDER — THIAMINE HCL 100 MG/ML IJ SOLN
100.0000 mg | Freq: Once | INTRAMUSCULAR | Status: AC
  Administered 2016-04-12: 100 mg via INTRAVENOUS
  Filled 2016-04-12: qty 2

## 2016-04-12 MED ORDER — POTASSIUM PHOSPHATES 15 MMOLE/5ML IV SOLN
25.0000 mmol | Freq: Once | INTRAVENOUS | Status: AC
  Administered 2016-04-12: 25 mmol via INTRAVENOUS
  Filled 2016-04-12: qty 8.33

## 2016-04-12 MED ORDER — TRACE MINERALS CR-CU-MN-SE-ZN 10-1000-500-60 MCG/ML IV SOLN
INTRAVENOUS | Status: AC
  Administered 2016-04-12: 18:00:00 via INTRAVENOUS
  Filled 2016-04-12: qty 720

## 2016-04-12 NOTE — Progress Notes (Signed)
Subjective: Sitting up in bed eating -better oral intake and more comfortable.  Objective: Vital signs in last 24 hours: Vitals:   04/11/16 2014 04/12/16 0500 04/12/16 0510 04/12/16 1335  BP: 111/73  114/71 (!) 106/58  Pulse: 88  77 70  Resp: 19  18   Temp: 98.4 F (36.9 C)  98.9 F (37.2 C) 98.7 F (37.1 C)  TempSrc: Oral  Oral Oral  SpO2: 100%  100% 100%  Weight:  108 lb 9.6 oz (49.3 kg)    Height:       Weight change: 13 lb 14.4 oz (6.305 kg)  Intake/Output Summary (Last 24 hours) at 04/12/16 1451 Last data filed at 04/12/16 0324  Gross per 24 hour  Intake             1632 ml  Output                0 ml  Net             1632 ml     Exam: Heart:: Regular rate and rhythm Lungs: clear to auscultation and percussion Abdomen: soft, nontender, normal bowel sounds   Lab Results: @LABTEST2 @ Micro Results: No results found for this or any previous visit (from the past 240 hour(s)). Studies/Results: Dg Chest Port 1 View  Result Date: 04/11/2016 CLINICAL DATA:  Status post PICC placement. EXAM: PORTABLE CHEST 1 VIEW COMPARISON:  04/06/2016. FINDINGS: Mildly enlarged cardiac silhouette with an interval mild decrease in size. Right PICC tip in the region of the upper right atrium. Small amount of linear density in the left lower lobe. Previously noted T 90 degrees 10 vertebral compression fracture. Possible additional vertebral compression fractures, difficult to assess in the frontal projection. Diffuse osteopenia. Mild scoliosis. IMPRESSION: 1. Right PICC tip in the upper right atrium. This could be retracted approximately 6 mm to place it at the superior cavoatrial junction. 2. Small amount of linear atelectasis and probable scarring in the left lower lobe. 3. Mild cardiomegaly with improvement. 4. T10 and possible additional thoracic vertebral compression deformities. Electronically Signed   By: Claudie Revering M.D.   On: 04/11/2016 15:49   Dg Chest Port 1v Same  Day  Result Date: 04/11/2016 CLINICAL DATA:  PICC line adjustment EXAM: PORTABLE CHEST 1 VIEW COMPARISON:  Portable exam 1636 hours compared to 1527 hours FINDINGS: Tip of RIGHT arm PICC line now projects over SVC above cavoatrial junction. Enlargement of cardiac silhouette. Atherosclerotic calcification aorta. Pulmonary vascularity normal. Lungs appear unchanged, with atelectasis versus scarring at LEFT base and question underlying emphysematous changes. No pleural effusion or pneumothorax. Bones demineralized. IMPRESSION: Tip of RIGHT arm PICC line now projects over SVC. Enlargement of cardiac silhouette with suspected emphysematous changes and LEFT lower lobe atelectasis versus scarring. Electronically Signed   By: Lavonia Dana M.D.   On: 04/11/2016 16:54   Medications: I have reviewed the patient's current medications. Scheduled Meds: . acetaminophen  650 mg Oral Once  . acyclovir  400 mg Oral BID  . docusate sodium  100 mg Oral BID  . dronabinol  2.5 mg Oral BID AC  . famotidine (PEPCID) IV  20 mg Intravenous Q12H  . fentaNYL  12.5 mcg Transdermal Q72H  . insulin aspart  0-9 Units Subcutaneous Q6H  . mouth rinse  15 mL Mouth Rinse BID  . potassium phosphate IVPB (mmol)  25 mmol Intravenous Once  . sertraline  25 mg Oral Daily  . sodium chloride flush  3 mL Intravenous  Q12H  . sucralfate  1 g Oral TID   Continuous Infusions: . sodium chloride 50 mL/hr at 04/11/16 2200  . Marland KitchenTPN (CLINIMIX-E) Adult 30 mL/hr at 04/11/16 1706   And  . fat emulsion 240 mL (04/11/16 1706)   PRN Meds:.acetaminophen **OR** acetaminophen, alum & mag hydroxide-simeth, HYDROmorphone (DILAUDID) injection, lactulose, magnesium hydroxide, metoCLOPramide (REGLAN) injection, ondansetron **OR** ondansetron (ZOFRAN) IV, prochlorperazine, sodium chloride flush   Assessment: Active Problems:   Intractable vomiting with nausea   Anorexia   Abnormal loss of weight    Plan: Family does not want a PEG . To proceed with  current therapy and nutrition.   LOS: 4 days   Francisco [name] 04/12/2016, 2:51 PM

## 2016-04-12 NOTE — Plan of Care (Signed)
Problem: Pain Managment: Goal: General experience of comfort will improve Outcome: Progressing C/o back pain and abdominal pain once. Dilaudid given with improvement.  Problem: Physical Regulation: Goal: Ability to maintain clinical measurements within normal limits will improve Outcome: Progressing Potassium serum 2.8. Potassium supplement infusing.  Problem: Fluid Volume: Goal: Ability to maintain a balanced intake and output will improve Outcome: Progressing Poor appetite. Pt eat small amt of home foods per family. TPN and fats infusion continues. IVF infusing. No c/o n/v.  Problem: Nutrition: Goal: Adequate nutrition will be maintained Outcome: Progressing As above.

## 2016-04-12 NOTE — Progress Notes (Signed)
Speech Therapy Note: consulted by MD w/ the request to try the option of thickened liquids when drinking liquids to prevent the hiccups which he still suffers from during oral intake (moreso when drinking). MD stated pt feels the solid foods can be eaten w/ less hiccupping. ST modified the diet to a Regular w/ Nectar liquids; MD updated family on the diet change. ST will be available for any further education as needed. MD agreed. NSG updated.

## 2016-04-12 NOTE — Progress Notes (Signed)
PARENTERAL NUTRITION CONSULT NOTE - INITIAL  Pharmacy Consult for electrolytes and BG monitoring with TPN Indication: Day #2 TPN  No Known Allergies  Patient Measurements: Height: 5' 10"  (177.8 cm) Weight: 108 lb 9.6 oz (49.3 kg) IBW/kg (Calculated) : 73 Actual body weight is < ideal body weight  Vital Signs: Temp: 98.7 F (37.1 C) (10/03 1335) Temp Source: Oral (10/03 1335) BP: 106/58 (10/03 1335) Pulse Rate: 70 (10/03 1335) Intake/Output from previous day: 10/02 0701 - 10/03 0700 In: 1882 [P.O.:250; I.V.:1582; IV Piggyback:50] Out: -  Intake/Output from this shift: No intake/output data recorded.  Labs:  Recent Labs  04/11/16 1430 04/12/16 0820  WBC 3.2* 3.4*  HGB 10.7* 10.7*  HCT 30.9* 30.4*  PLT 118* 106*     Recent Labs  04/11/16 1430 04/12/16 0820  NA 141 139  K 2.7* 2.8*  CL 114* 111  CO2 23 26  GLUCOSE 121* 123*  BUN 17 13  CREATININE 0.78 0.71  CALCIUM 7.1* 6.6*  MG 2.2 2.0  PHOS 1.1* 1.2*  PROT  --  7.0  ALBUMIN  --  2.6*  AST  --  59*  ALT  --  50  ALKPHOS  --  76  BILITOT  --  0.6  PREALBUMIN  --  9.2*  TRIG  --  49   Estimated Creatinine Clearance: 59.1 mL/min (by C-G formula based on SCr of 0.71 mg/dL).    Recent Labs  04/12/16 0508 04/12/16 0735 04/12/16 1150  GLUCAP 116* 131* 147*    Medical History: Past Medical History:  Diagnosis Date  . Anemia   . Cancer (HCC)    Bone metastasis  . Constipation   . Hearing loss   . Hoarse voice quality   . Liver lesion   . Malaria 2003  . Multiple myeloma ([place]) 03/22/2016   Per Dr. Rogue Bussing on his PET order.  . Tuberculosis 1985    Medications:  Scheduled:  . acetaminophen  650 mg Oral Once  . acyclovir  400 mg Oral BID  . docusate sodium  100 mg Oral BID  . dronabinol  2.5 mg Oral BID AC  . famotidine (PEPCID) IV  20 mg Intravenous Q12H  . fentaNYL  12.5 mcg Transdermal Q72H  . insulin aspart  0-9 Units Subcutaneous Q6H  . mouth rinse  15 mL Mouth Rinse BID  .  potassium phosphate IVPB (mmol)  25 mmol Intravenous Once  . sertraline  25 mg Oral Daily  . sodium chloride flush  3 mL Intravenous Q12H  . sucralfate  1 g Oral TID  . thiamine injection  100 mg Intravenous Once    Insulin Requirements in the past 24 hours:  CBG and SSI ordered q6h while TPN is infusing.  BG up to 147 but patient refused sliding scale coverage  Current Nutrition:  Continue Clinimix E 5/15 @ 30 mL/hr via PICC line today. MVI and multitrace added to TPN.   Patient is at risk for refeeding syndrome. Pharmacy is monitoring and replacing electrolytes. Electrolytes remain very low despite repletion yesterday with 25 mmol of potassium phosphate IV. Will need to be closely monitored.  Patient is frequently refusing lab work, which makes monitoring electrolytes difficult. Patient initially refused lab work this morning but later consented. Expressed concern to MD regarding frequent refusal and explained to patient and family members the importance of electrolyte monitoring and replacement.   Assessment: K = 2.8 Mg = 2.0 Phos = 1.2  Plan:  Order another dose of potassium  phosphate 25 mmol IV now  Will recheck K/Phos/Mg @ 1830 this evening after replacement.  Lenis Noon, PharmD Clinical Pharmacist 04/12/2016,3:33 PM

## 2016-04-12 NOTE — Telephone Encounter (Signed)
Tried to reach the patient's son-left a message; we will meet in the morning.

## 2016-04-12 NOTE — Progress Notes (Signed)
Physical Therapy Treatment Patient Details Name: Francisco [name] MRN: [id number] DOB: [birth year] Today's Date: 04/12/2016    History of Present Illness 70 y/o male here with nausea and vomiting.  Until recently he was able to be active, walk "normally," etc.  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     PT Comments    Pt weak and lethargic but did agree to do some supine LE exercises.  He was very slow, guarded and limited with what he was able to do but showed good effort at he was able.  Pt reported he has been getting up some to go to the bathroom, but did not want to try to do a lot of mobility/ambulation today.  Overall he was inconsistent with how much resistance he was able to give/take during exercises, but he showed consistent effort with what he did do.  Discussed with pt/family importance of trying to stay active/increase ambulation as well as some gentle ideas/stretches for neck/spinal stiffness.   Follow Up Recommendations  Home health PT     Equipment Recommendations  Rolling walker with 5" wheels    Recommendations for Other Services       Precautions / Restrictions Precautions Precautions: Fall Restrictions Weight Bearing Restrictions: No    Mobility  Bed Mobility               General bed mobility comments: pt defers any mobility in/out of bed  Transfers                    Ambulation/Gait                 Stairs            Wheelchair Mobility    Modified Rankin (Stroke Patients Only)       Balance                                    Cognition Arousal/Alertness: Lethargic Behavior During Therapy: WFL for tasks assessed/performed Overall Cognitive Status: Within Functional Limits for tasks assessed                      Exercises General Exercises - Lower Extremity Short Arc Quad: Strengthening;10 reps;Both Heel Slides: Strengthening;10 reps;Both Hip  ABduction/ADduction: Strengthening;10 reps;Both    General Comments        Pertinent Vitals/Pain Pain Assessment: 0-10 Pain Score: 1  (reports he recently had meds and is feeling better)    Home Living                      Prior Function            PT Goals (current goals can now be found in the care plan section) Progress towards PT goals:  (Pt deferred walking this afternoon)    Frequency    Min 2X/week      PT Plan Current plan remains appropriate    Co-evaluation             End of Session   Activity Tolerance: Patient limited by fatigue Patient left: with bed alarm set;with call bell/phone within reach;with family/visitor present     Time: [id number] PT Time Calculation (min) (ACUTE ONLY): 16 min  Charges:  $Therapeutic Exercise: 8-22 mins  G CodesKreg Shropshire, DPT 04/12/2016, 4:54 PM

## 2016-04-12 NOTE — Progress Notes (Signed)
[place] [facility] [place] NAME: Francisco [name]    MR#:  [id number]  DATE OF BIRTH:  [birth year]  SUBJECTIVE;;  He is seen today, going for radiation therapy. Received a PICC line and started on TPN.   CHIEF COMPLAINT:   Chief Complaint  Patient presents with  . Emesis    REVIEW OF SYSTEMS:    Review of Systems  Constitutional: Positive for weight loss. Negative for chills and fever.  HENT: Negative for hearing loss.   Eyes: Negative for blurred vision, double vision and photophobia.  Respiratory: Negative for cough, hemoptysis and shortness of breath.   Cardiovascular: Negative for palpitations, orthopnea and leg swelling.  Gastrointestinal: Positive for heartburn, nausea and vomiting. Negative for abdominal pain and diarrhea.  Genitourinary: Negative for dysuria and urgency.  Musculoskeletal: Negative for myalgias and neck pain.  Skin: Negative for rash.  Neurological: Positive for weakness. Negative for dizziness, focal weakness, seizures and headaches.  Psychiatric/Behavioral: Negative for memory loss. The patient does not have insomnia.     Nutrition:  Tolerating Diet:no Tolerating PT:      DRUG ALLERGIES:  No Known Allergies  VITALS:  Blood pressure 114/71, pulse 77, temperature 98.9 F (37.2 C), temperature source Oral, resp. rate 18, height 5' 10"  (1.778 m), weight 49.3 kg (108 lb 9.6 oz), SpO2 100 %.  PHYSICAL EXAMINATION:   Physical Exam  GENERAL:  70 y.o.-year-old patient lying in the bed with no acute distress.cachectic. EYES: Pupils equal, round, reactive to light and accommodation. No scleral icterus. Extraocular muscles intact.  HEENT: Head atraumatic, normocephalic. Oropharynx and nasopharynx clear.  NECK:  Supple, no jugular venous distention. No thyroid enlargement, no tenderness.  LUNGS: Normal breath sounds bilaterally, no wheezing, rales,rhonchi or crepitation. No use of accessory muscles of  respiration.  CARDIOVASCULAR: S1, S2 normal. No murmurs, rubs, or gallops.  ABDOMEN: Soft, nontender, nondistended. Bowel sounds present. No organomegaly or mass.  EXTREMITIES: No pedal edema, cyanosis, or clubbing.  NEUROLOGIC: Cranial nerves II through XII are intact. Muscle strength 5/5 in all extremities. Sensation intact. Gait not checked.  PSYCHIATRIC: The patient is alert and oriented x 3.  SKIN: No obvious rash, lesion, or ulcer.    LABORATORY PANEL:   CBC  Recent Labs Lab 04/12/16 0820  WBC 3.4*  HGB 10.7*  HCT 30.4*  PLT 106*   ------------------------------------------------------------------------------------------------------------------  Chemistries   Recent Labs Lab 04/06/16 0014  04/11/16 1430  NA 138  < > 141  K 4.1  < > 2.7*  CL 113*  < > 114*  CO2 22  < > 23  GLUCOSE 141*  < > 121*  BUN 42*  < > 17  CREATININE 1.15  < > 0.78  CALCIUM 8.4*  < > 7.1*  MG 2.2  --  2.2  AST 29  --   --   ALT 23  --   --   ALKPHOS 78  --   --   BILITOT 0.6  --   --   < > = values in this interval not displayed. ------------------------------------------------------------------------------------------------------------------  Cardiac Enzymes No results for input(s): TROPONINI in the last 168 hours. ------------------------------------------------------------------------------------------------------------------  RADIOLOGY:  Dg Chest Port 1 View  Result Date: 04/11/2016 CLINICAL DATA:  Status post PICC placement. EXAM: PORTABLE CHEST 1 VIEW COMPARISON:  04/06/2016. FINDINGS: Mildly enlarged cardiac silhouette with an interval mild decrease in size. Right PICC tip in the region of the upper right atrium. Small amount of linear density  in the left lower lobe. Previously noted T 90 degrees 10 vertebral compression fracture. Possible additional vertebral compression fractures, difficult to assess in the frontal projection. Diffuse osteopenia. Mild scoliosis. IMPRESSION: 1.  Right PICC tip in the upper right atrium. This could be retracted approximately 6 mm to place it at the superior cavoatrial junction. 2. Small amount of linear atelectasis and probable scarring in the left lower lobe. 3. Mild cardiomegaly with improvement. 4. T10 and possible additional thoracic vertebral compression deformities. Electronically Signed   By: Claudie Revering M.D.   On: 04/11/2016 15:49   Dg Chest Port 1v Same Day  Result Date: 04/11/2016 CLINICAL DATA:  PICC line adjustment EXAM: PORTABLE CHEST 1 VIEW COMPARISON:  Portable exam 1636 hours compared to 1527 hours FINDINGS: Tip of RIGHT arm PICC line now projects over SVC above cavoatrial junction. Enlargement of cardiac silhouette. Atherosclerotic calcification aorta. Pulmonary vascularity normal. Lungs appear unchanged, with atelectasis versus scarring at LEFT base and question underlying emphysematous changes. No pleural effusion or pneumothorax. Bones demineralized. IMPRESSION: Tip of RIGHT arm PICC line now projects over SVC. Enlargement of cardiac silhouette with suspected emphysematous changes and LEFT lower lobe atelectasis versus scarring. Electronically Signed   By: Lavonia Dana M.D.   On: 04/11/2016 16:54     ASSESSMENT AND PLAN:   Active Problems:   Intractable vomiting with nausea   Anorexia   Abnormal loss of weight   1. intractable nausea and vomiting likely secondary to acute gastritis from steroid use. Received Protonix drip,for 3 days but off now.on  IV Zofran, IV Phenergan., IV hydration' Still not eating well continue to have poor by mouth intake.  gastroenterology recommended. PEG Placement. Discussed with daughter-in-law extensively, and told that  PEG   Placement will be permanent because of his severe malnutrition and poor by mouth intake . Now we discussed TPN option,and he has PICC and TPN started.if he tolerates TPN and if electrolytes are corrected,he can be discharged home tomorow   #2 multiple myeloma with  chronic bone pains; fentanyl patch , continue IV when necessary morphine.;Radiation therapy today #3 severe malnutrition in the context of chronic illness: Continue Magic cups, Marinol ./ plan  to start TPN through a PICC line, likely discharge in next 2 days if she tolerates the tpn. Patient agreed for it discussed with family, oncology.   #4. 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  5.hoarseness of voice: ENT will follow the patient is an outpatient. #6 physical therapy consulted.Home health physical therapy.  #7 depression: Start on Zoloft 50 MG daily.  #8 hypokalemia and hypophosphatemia;called Pharmacy yesterday and asked them to replace.  Spoke with DIL for about 30 min Discussed with Dr. Rogue Bussing ,RN  All the records are reviewed and case discussed with Care Management/Social Workerr. Management plans discussed with the patient, family and they are in agreement.  CODE STATUS: full  TOTAL TIME TAKING CARE OF THIS PATIENT: 35 minutes.   POSSIBLE D/C IN  1-2 DAYS, DEPENDING ON CLINICAL CONDITION.   [name] M.D on 04/12/2016 at 9:18 AM  Between 7am to 6pm - Pager - [phone]  After 6pm go to [email or web address] - password [email or web address] [facility]  Office  [phone]  CC: Primary care physician; Alfonse Flavors, MD

## 2016-04-12 NOTE — Progress Notes (Addendum)
Nutrition Follow-up  DOCUMENTATION CODES:   Severe malnutrition in context of chronic illness  INTERVENTION:  ------------------------------------------------------------------------------------------------------------------------ ADDENDUM: Pharmacy called, with patient's lytes not WNL, recommended we not increase Clinmix E 5/15 to goal rate, will be maintained at 84m/hr until lytes are WNL. Agreed, continue to monitor closely ---------------------------------------------------------------------------------------------------------------------- According to pharmacy, patient was refusing lab draws this morning. Pt is suffering from refeeding syndrome Lytes this morning: Phos 1.2 (high risk of mortality), K 2.8, Mg WDL Encourage PO intake, pt continues to consume very little food Continue Magic cup TID with meals, each supplement provides 290 kcal and 9 grams of protein Continue Marinol BID  NUTRITION DIAGNOSIS:   Malnutrition related to chronic illness as evidenced by severe depletion of muscle mass, severe depletion of body fat, percent weight loss. -Ongoing  GOAL:   Patient will meet greater than or equal to 90% of their needs Progressing with TPN  MONITOR:   PO intake, I & O's, Supplement acceptance, Labs, Weight trends  REASON FOR ASSESSMENT:   Other (Comment) (Low BMI)    ASSESSMENT:   The patient with past medical history significant for multiple myeloma presents emergency department complaining of recurrent nausea and vomiting. The patient recently underwent chemotherapy and received intravenous fluid in clinic.  Headed to radiation today Per MD note, if pts electrolytes can be corrected he will d/c tomorrow. Advanced diet to regular with nectar thick liquids per SLP to help with hiccupping.  Diet Order:  TPN (CLINIMIX-E) Adult Diet regular Room service appropriate? Yes with Assist; Fluid consistency: Nectar Thick  Skin:  Reviewed, no issues  Last BM:   10/1  Height:   Ht Readings from Last 1 Encounters:  04/06/16 _0  (1.778 m)    Weight:   Wt Readings from Last 1 Encounters:  04/12/16 108 lb 9.6 oz (49.3 kg)    Ideal Body Weight:  75.45 kg  BMI:  Body mass index is 15.58 kg/m.  Estimated Nutritional Needs:   Kcal:  1400-1650 calories  Protein:  56-70 gm  Fluid:  >/= 1.4L  EDUCATION NEEDS:   No education needs identified at this time  WSatira Anis Krystle Polcyn, MS, RD LDN Inpatient Clinical Dietitian Pager [phone]

## 2016-04-13 DIAGNOSIS — R627 Adult failure to thrive: Secondary | ICD-10-CM

## 2016-04-13 DIAGNOSIS — K2900 Acute gastritis without bleeding: Secondary | ICD-10-CM | POA: Diagnosis not present

## 2016-04-13 DIAGNOSIS — D6481 Anemia due to antineoplastic chemotherapy: Secondary | ICD-10-CM

## 2016-04-13 DIAGNOSIS — C9000 Multiple myeloma not having achieved remission: Secondary | ICD-10-CM | POA: Diagnosis not present

## 2016-04-13 DIAGNOSIS — E876 Hypokalemia: Secondary | ICD-10-CM

## 2016-04-13 DIAGNOSIS — Z510 Encounter for antineoplastic radiation therapy: Secondary | ICD-10-CM | POA: Diagnosis not present

## 2016-04-13 DIAGNOSIS — Z794 Long term (current) use of insulin: Secondary | ICD-10-CM

## 2016-04-13 DIAGNOSIS — D696 Thrombocytopenia, unspecified: Secondary | ICD-10-CM | POA: Diagnosis not present

## 2016-04-13 DIAGNOSIS — R112 Nausea with vomiting, unspecified: Secondary | ICD-10-CM | POA: Diagnosis not present

## 2016-04-13 LAB — COMPREHENSIVE METABOLIC PANEL
ALBUMIN: 2.4 g/dL — AB (ref 3.5–5.0)
ALT: 57 U/L (ref 17–63)
ANION GAP: 2 — AB (ref 5–15)
AST: 57 U/L — ABNORMAL HIGH (ref 15–41)
Alkaline Phosphatase: 72 U/L (ref 38–126)
BUN: 14 mg/dL (ref 6–20)
CHLORIDE: 111 mmol/L (ref 101–111)
CO2: 25 mmol/L (ref 22–32)
CREATININE: 0.65 mg/dL (ref 0.61–1.24)
Calcium: 6.6 mg/dL — ABNORMAL LOW (ref 8.9–10.3)
GFR calc non Af Amer: >60 mL/min (ref >60)
GLUCOSE: 110 mg/dL — AB (ref 65–99)
Potassium: 3.4 mmol/L — ABNORMAL LOW (ref 3.5–5.1)
SODIUM: 138 mmol/L (ref 135–145)
Total Bilirubin: 0.3 mg/dL (ref 0.3–1.2)
Total Protein: 6.8 g/dL (ref 6.5–8.1)

## 2016-04-13 LAB — MAGNESIUM
Magnesium: 2 mg/dL (ref 1.7–2.4)
Magnesium: 2.2 mg/dL (ref 1.7–2.4)
Magnesium: 2 mg/dL (ref 1.7–2.4)

## 2016-04-13 LAB — POTASSIUM
POTASSIUM: 3.6 mmol/L (ref 3.5–5.1)
Potassium: 2.6 mmol/L — CL (ref 3.5–5.1)

## 2016-04-13 LAB — PHOSPHORUS
PHOSPHORUS: 1.2 mg/dL — AB (ref 2.5–4.6)
PHOSPHORUS: 2.5 mg/dL (ref 2.5–4.6)
Phosphorus: 2.2 mg/dL — ABNORMAL LOW (ref 2.5–4.6)

## 2016-04-13 LAB — GLUCOSE, CAPILLARY: Glucose-Capillary: 115 mg/dL — ABNORMAL HIGH (ref 65–99)

## 2016-04-13 MED ORDER — POTASSIUM CHLORIDE IN NACL 20-0.9 MEQ/L-% IV SOLN
INTRAVENOUS | Status: DC
  Administered 2016-04-13: 05:00:00 via INTRAVENOUS
  Filled 2016-04-13 (×2): qty 1000

## 2016-04-13 MED ORDER — SERTRALINE HCL 25 MG PO TABS: 25.0000 mg | ORAL_TABLET | Freq: Every day | ORAL | 0 refills | Status: DC

## 2016-04-13 MED ORDER — DEXTROSE 5 % IV SOLN: 10.0000 mmol | Freq: Once | INTRAVENOUS | Status: DC

## 2016-04-13 MED ORDER — DRONABINOL 2.5 MG PO CAPS: 2.5000 mg | ORAL_CAPSULE | Freq: Two times a day (BID) | ORAL | 0 refills | Status: DC

## 2016-04-13 MED ORDER — DEXTROSE 5 % IV SOLN
15.0000 mmol | Freq: Once | INTRAVENOUS | Status: DC
  Filled 2016-04-13: qty 5

## 2016-04-13 MED ORDER — POTASSIUM CHLORIDE 10 MEQ/100ML IV SOLN
10.0000 meq | INTRAVENOUS | Status: AC
  Administered 2016-04-13 (×4): 10 meq via INTRAVENOUS
  Filled 2016-04-13 (×4): qty 100

## 2016-04-13 MED ORDER — FENTANYL 12 MCG/HR TD PT72: 12.5000 ug | MEDICATED_PATCH | TRANSDERMAL | 0 refills | Status: DC

## 2016-04-13 MED ORDER — TRACE MINERALS CR-CU-MN-SE-ZN 10-1000-500-60 MCG/ML IV SOLN: INTRAVENOUS | Status: DC

## 2016-04-13 MED ORDER — HYDROCODONE-ACETAMINOPHEN 5-325 MG PO TABS: 1.0000 | ORAL_TABLET | ORAL | 0 refills | Status: DC | PRN

## 2016-04-13 MED ORDER — DEXTROSE 5 % IV SOLN
30.0000 mmol | Freq: Once | INTRAVENOUS | Status: AC
  Administered 2016-04-13: 11:00:00 30 mmol via INTRAVENOUS
  Filled 2016-04-13: qty 10

## 2016-04-13 MED ORDER — SUCRALFATE 1 G PO TABS: 1.0000 g | ORAL_TABLET | Freq: Three times a day (TID) | ORAL | 1 refills | Status: DC

## 2016-04-13 MED ORDER — POTASSIUM CHLORIDE CRYS ER 20 MEQ PO TBCR: 40.0000 meq | EXTENDED_RELEASE_TABLET | Freq: Once | ORAL | Status: DC

## 2016-04-13 MED ORDER — THIAMINE HCL 100 MG/ML IJ SOLN
100.0000 mg | Freq: Every day | INTRAMUSCULAR | Status: DC
  Administered 2016-04-13: 100 mg via INTRAVENOUS
  Filled 2016-04-13: qty 2

## 2016-04-13 MED ORDER — K PHOS MONO-SOD PHOS DI & MONO 155-852-130 MG PO TABS: 250.0000 mg | ORAL_TABLET | Freq: Two times a day (BID) | ORAL | 0 refills | Status: DC

## 2016-04-13 MED ORDER — VITAMIN B-1 100 MG PO TABS: 100.0000 mg | ORAL_TABLET | Freq: Every day | ORAL | Status: DC

## 2016-04-13 MED ORDER — VITAMIN B-1 100 MG PO TABS: 100.0000 mg | ORAL_TABLET | Freq: Every day | ORAL | 0 refills | Status: DC

## 2016-04-13 MED ORDER — LACTULOSE 10 GM/15ML PO SOLN: 20.0000 g | Freq: Two times a day (BID) | ORAL | 0 refills | Status: DC | PRN

## 2016-04-13 MED ORDER — ALUM & MAG HYDROXIDE-SIMETH 200-200-20 MG/5ML PO SUSP: 30.0000 mL | ORAL | 0 refills | Status: DC | PRN

## 2016-04-13 NOTE — Progress Notes (Signed)
PARENTERAL NUTRITION CONSULT NOTE - INITIAL  Pharmacy Consult for electrolytes and BG monitoring with TPN Indication: Day #2 TPN  No Known Allergies  Patient Measurements: Height: 5' 10"  (177.8 cm) Weight: 108 lb (49 kg) IBW/kg (Calculated) : 73 Actual body weight is < ideal body weight  Vital Signs: Temp: 99.4 F (37.4 C) (10/04 1019) Temp Source: Oral (10/04 1019) BP: 101/60 (10/04 1019) Pulse Rate: 79 (10/04 1019) Intake/Output from previous day: 10/03 0701 - 10/04 0700 In: 1012 [I.V.:812; IV Piggyback:200] Out: -  Intake/Output from this shift: No intake/output data recorded.  Labs:  Recent Labs  04/11/16 1430 04/12/16 0820  WBC 3.2* 3.4*  HGB 10.7* 10.7*  HCT 30.9* 30.4*  PLT 118* 106*     Recent Labs  04/11/16 1430 04/12/16 0820 04/12/16 2326 04/13/16 0810  NA 141 139  --  138  K 2.7* 2.8* 2.6* 3.4*  CL 114* 111  --  111  CO2 23 26  --  25  GLUCOSE 121* 123*  --  110*  BUN 17 13  --  14  CREATININE 0.78 0.71  --  0.65  CALCIUM 7.1* 6.6*  --  6.6*  MG 2.2 2.0 2.0 2.2  PHOS 1.1* 1.2* 2.2* 1.2*  PROT  --  7.0  --  6.8  ALBUMIN  --  2.6*  --  2.4*  AST  --  59*  --  57*  ALT  --  50  --  57  ALKPHOS  --  76  --  72  BILITOT  --  0.6  --  0.3  PREALBUMIN  --  9.2*  --   --   TRIG  --  49  --   --    Estimated Creatinine Clearance: 58.7 mL/min (by C-G formula based on SCr of 0.65 mg/dL).    Recent Labs  04/12/16 1650 04/12/16 2340 04/13/16 0519  GLUCAP 139* 141* 115*    Medical History: Past Medical History:  Diagnosis Date  . Anemia   . Cancer (HCC)    Bone metastasis  . Constipation   . Hearing loss   . Hoarse voice quality   . Liver lesion   . Malaria 2003  . Multiple myeloma ([place]) 03/22/2016   Per Dr. Rogue Bussing on his PET order.  . Tuberculosis 1985    Medications:  Scheduled:  . acetaminophen  650 mg Oral Once  . acyclovir  400 mg Oral BID  . docusate sodium  100 mg Oral BID  . dronabinol  2.5 mg Oral BID AC  .  famotidine (PEPCID) IV  20 mg Intravenous Q12H  . fentaNYL  12.5 mcg Transdermal Q72H  . insulin aspart  0-9 Units Subcutaneous Q6H  . mouth rinse  15 mL Mouth Rinse BID  . potassium phosphate IVPB (mmol)  30 mmol Intravenous Once  . sertraline  25 mg Oral Daily  . sodium chloride flush  3 mL Intravenous Q12H  . sucralfate  1 g Oral TID    Insulin Requirements in the past 24 hours:  CBG and SSI ordered q6h while TPN is infusing.  BG ranging from 110 -123 but patient refusing sliding scale coverage  Current Nutrition:  Continue Clinimix E 5/15 @ 30 mL/hr via PICC line today. MVI and multitrace added to TPN. Discussed with dietician.  Patient is at risk for refeeding syndrome. Pharmacy is monitoring and replacing electrolytes. Electrolytes remain very low despite repletion and will need to be closely monitored.  Patient is frequently refusing  lab work, which makes monitoring electrolytes difficult. Patient initially refused lab work last night and this morning but later consented. Explained to patient and family members the importance of electrolyte monitoring and replacement on 10/3.  Patient has discharge orders entered for today with plans for outpatient home TPN to be continued. Expressed concern to MD regarding discharge on TPN with critically low electrolytes and frequent refusal of monitoring by patient, especially in regards to refeeding syndrome.   Assessment: Based on morning labs 10/4 K = 3.4 (could be falsely elevated as lab was taken shortly after potassium run finished) Mg = 2.2 (WNL) Phos = 1.2 (critically low)  Plan:  Ordered potassium phosphate 30 mmol IV this morning, which is currently infusing and will infuse over 6 hours.  Will recheck K/Phos/Mg @ 1830 this evening after replacement.  Will also order thiamine 100 mg daily per discussion with MD.  Lenis Noon, PharmD, BCPS Clinical Pharmacist 04/13/2016,1:56 PM

## 2016-04-13 NOTE — Progress Notes (Signed)
Patient discharged home  via EMS at [facility].

## 2016-04-13 NOTE — Discharge Instructions (Signed)
TPN to be continued

## 2016-04-13 NOTE — Care Management (Signed)
Francisco [name] has multiple myeloma and  at this stage requires his upper body to  positioned in ways not feasible with a normal bed. Head must be elevated at least 30 degrees or greater to prevent respiratory distress or increased pain  Shelbie Ammons RN MSN [facility] [phone]

## 2016-04-13 NOTE — Progress Notes (Signed)
Reviewed discharge instructions. Home meds, prescriptions and follow up appointments with patient and family and all verbalized understanding discharged to home via EMS

## 2016-04-13 NOTE — Care Management (Signed)
Francisco [name] is diagnosed with multiple myeloma which impairs his ability to perform basic daily activities of living in his home. S cane, walker or crutch will not resolve his issues with performing activities of daily living. A wheelchair will allow patient to safely perform his daily activities. Francisco [name] can safely propel the wheelchair in the home or have a caregiver who can provide assistance Shelbie Ammons RN MSN [facility] [phone]

## 2016-04-13 NOTE — Progress Notes (Signed)
PARENTERAL NUTRITION CONSULT NOTE - INITIAL  Pharmacy Consult for electrolytes and BG monitoring with TPN Indication: Day #2 TPN  No Known Allergies  Patient Measurements: Height: 5' 10"  (177.8 cm) Weight: 108 lb (49 kg) IBW/kg (Calculated) : 73 Actual body weight is < ideal body weight  Vital Signs: Temp: 98.4 F (36.9 C) (10/04 1950) Temp Source: Oral (10/04 1950) BP: 113/68 (10/04 1950) Pulse Rate: 72 (10/04 1950) Intake/Output from previous day: 10/03 0701 - 10/04 0700 In: 1012 [I.V.:812; IV Piggyback:200] Out: -  Intake/Output from this shift: No intake/output data recorded.  Labs:  Recent Labs  04/11/16 1430 04/12/16 0820  WBC 3.2* 3.4*  HGB 10.7* 10.7*  HCT 30.9* 30.4*  PLT 118* 106*     Recent Labs  04/11/16 1430 04/12/16 0820 04/12/16 2326 04/13/16 0810 04/13/16 1838  NA 141 139  --  138  --   K 2.7* 2.8* 2.6* 3.4* 3.6  CL 114* 111  --  111  --   CO2 23 26  --  25  --   GLUCOSE 121* 123*  --  110*  --   BUN 17 13  --  14  --   CREATININE 0.78 0.71  --  0.65  --   CALCIUM 7.1* 6.6*  --  6.6*  --   MG 2.2 2.0 2.0 2.2 2.0  PHOS 1.1* 1.2* 2.2* 1.2* 2.5  PROT  --  7.0  --  6.8  --   ALBUMIN  --  2.6*  --  2.4*  --   AST  --  59*  --  57*  --   ALT  --  50  --  57  --   ALKPHOS  --  76  --  72  --   BILITOT  --  0.6  --  0.3  --   PREALBUMIN  --  9.2*  --   --   --   TRIG  --  49  --   --   --    Estimated Creatinine Clearance: 58.7 mL/min (by C-G formula based on SCr of 0.65 mg/dL).    Recent Labs  04/12/16 1650 04/12/16 2340 04/13/16 0519  GLUCAP 139* 141* 115*    Medical History: Past Medical History:  Diagnosis Date  . Anemia   . Cancer (HCC)    Bone metastasis  . Constipation   . Hearing loss   . Hoarse voice quality   . Liver lesion   . Malaria 2003  . Multiple myeloma ([place]) 03/22/2016   Per Dr. Rogue Bussing on his PET order.  . Tuberculosis 1985    Medications:  Scheduled:  . acetaminophen  650 mg Oral Once  .  acyclovir  400 mg Oral BID  . docusate sodium  100 mg Oral BID  . dronabinol  2.5 mg Oral BID AC  . famotidine (PEPCID) IV  20 mg Intravenous Q12H  . fentaNYL  12.5 mcg Transdermal Q72H  . insulin aspart  0-9 Units Subcutaneous Q6H  . mouth rinse  15 mL Mouth Rinse BID  . sertraline  25 mg Oral Daily  . sodium chloride flush  3 mL Intravenous Q12H  . sucralfate  1 g Oral TID  . thiamine injection  100 mg Intravenous Daily    Insulin Requirements in the past 24 hours:  CBG and SSI ordered q6h while TPN is infusing.  BG ranging from 110 -123 but patient refusing sliding scale coverage  Current Nutrition:  Continue Clinimix E 5/15 @  30 mL/hr via PICC line today. MVI and multitrace added to TPN. Discussed with dietician.  Patient is at risk for refeeding syndrome. Pharmacy is monitoring and replacing electrolytes. Electrolytes remain very low despite repletion and will need to be closely monitored.  Patient is frequently refusing lab work, which makes monitoring electrolytes difficult. Patient initially refused lab work last night and this morning but later consented. Explained to patient and family members the importance of electrolyte monitoring and replacement on 10/3.  Patient has discharge orders entered for today with plans for outpatient home TPN to be continued. Expressed concern to MD regarding discharge on TPN with critically low electrolytes and frequent refusal of monitoring by patient, especially in regards to refeeding syndrome.   Assessment: Based on morning labs 10/4 K = 3.4 (could be falsely elevated as lab was taken shortly after potassium run finished) Mg = 2.2 (WNL) Phos = 1.2 (critically low)  Plan:  Ordered potassium phosphate 30 mmol IV this morning, which is currently infusing and will infuse over 6 hours.  Will recheck K/Phos/Mg @ 1830 this evening after replacement.  Will also order thiamine 100 mg daily per discussion with MD.  10/04 @ 18:30 :  K = 3.6 ,  Phos = 2.5 , Mag = 2.0  Saniyah Mondesir D, PharmD Clinical Pharmacist 04/13/2016,9:09 PM

## 2016-04-13 NOTE — Progress Notes (Signed)
[place] CONSULT NOTE  Patient Care Team: Alfonse Flavors, MD as PCP - General (Family Medicine)  CHIEF COMPLAINTS/PURPOSE OF CONSULTATION:  Persistent nausea vomiting/multiple myeloma  CC; nausea vomiting improved. Continues to have poor by mouth intake. On TPN. Tolerating TPN fairly well.  Overall feels weak however able to get to the bed and sit in a chair.   MEDICAL HISTORY:  Past Medical History:  Diagnosis Date  . Anemia   . Cancer (HCC)    Bone metastasis  . Constipation   . Hearing loss   . Hoarse voice quality   . Liver lesion   . Malaria 2003  . Multiple myeloma ([place]) 03/22/2016   Per Dr. Rogue Bussing on his PET order.  . Tuberculosis 1985    SURGICAL HISTORY: Past Surgical History:  Procedure Laterality Date  . RETINAL DETACHMENT SURGERY  2006    SOCIAL HISTORY: Lives in [place]. No smoking or alcohol. he teaches yoga. Social History   Social History  . Marital status: Married    Spouse name: N/A  . Number of children: N/A  . Years of education: N/A   Occupational History  . Not on file.   Social History Main Topics  . Smoking status: Never Smoker  . Smokeless tobacco: Never Used  . Alcohol use No  . Drug use: No  . Sexual activity: Not on file   Other Topics Concern  . Not on file   Social History Narrative  . No narrative on file    FAMILY HISTORY: History reviewed. No pertinent family history.  ALLERGIES:  has No Known Allergies.  MEDICATIONS:  Current Facility-Administered Medications  Medication Dose Route Frequency Provider Last Rate Last Dose  . 0.9 % NaCl with KCl 20 mEq/ L  infusion   Intravenous Continuous Saundra Shelling, MD   Stopped at 04/13/16 1815  . acetaminophen (TYLENOL) tablet 650 mg  650 mg Oral Q6H PRN Harrie Foreman, MD       Or  . acetaminophen (TYLENOL) suppository 650 mg  650 mg Rectal Q6H PRN Harrie Foreman, MD      . acetaminophen (TYLENOL) tablet 650 mg  650 mg Oral Once Cammie Sickle, MD      . acyclovir (ZOVIRAX) 200 MG capsule 400 mg  400 mg Oral BID Harrie Foreman, MD   400 mg at 04/10/16 0726  . alum & mag hydroxide-simeth (MAALOX/MYLANTA) 200-200-20 MG/5ML suspension 30 mL  30 mL Oral Q4H PRN Cammie Sickle, MD      . docusate sodium (COLACE) capsule 100 mg  100 mg Oral BID Harrie Foreman, MD   100 mg at 04/12/16 1124  . dronabinol (MARINOL) capsule 2.5 mg  2.5 mg Oral BID AC Harrie Foreman, MD   2.5 mg at 04/09/16 1700  . famotidine (PEPCID) IVPB 20 mg premix  20 mg Intravenous Q12H [name], MD   20 mg at 04/13/16 1003  . fentaNYL (DURAGESIC - dosed mcg/hr) 12.5 mcg  12.5 mcg Transdermal Q72H [name], MD   12.5 mcg at 04/12/16 1129  . HYDROmorphone (DILAUDID) injection 1 mg  1 mg Intravenous Q4H PRN [name], MD   1 mg at 04/13/16 1552  . insulin aspart (novoLOG) injection 0-9 Units  0-9 Units Subcutaneous Q6H Darylene Price Swayne, RPH      . lactulose (CHRONULAC) 10 GM/15ML solution 20 g  20 g Oral BID PRN [name], MD   20 g at 04/10/16 1704  .  magnesium hydroxide (MILK OF MAGNESIA) suspension 5 mL  5 mL Oral Daily PRN Harrie Foreman, MD   5 mL at 04/09/16 0908  . MEDLINE mouth rinse  15 mL Mouth Rinse BID [name], MD   15 mL at 04/10/16 0730  . metoCLOPramide (REGLAN) injection 5 mg  5 mg Intravenous Q8H PRN [name], MD   5 mg at 04/06/16 1701  . ondansetron (ZOFRAN) tablet 4 mg  4 mg Oral Q6H PRN Harrie Foreman, MD       Or  . ondansetron [facility]) injection 4 mg  4 mg Intravenous Q6H PRN Harrie Foreman, MD   4 mg at 04/09/16 2109  . prochlorperazine (COMPAZINE) injection 10 mg  10 mg Intravenous Q6H PRN [name], MD   10 mg at 04/08/16 1253  . sertraline (ZOLOFT) tablet 25 mg  25 mg Oral Daily [name], MD   25 mg at 04/11/16 1204  . sodium chloride flush (NS) 0.9 % injection 3 mL  3 mL Intravenous Q12H [name], MD   3 mL at 04/13/16 1000   . sodium chloride flush (NS) 0.9 % injection 3 mL  3 mL Intravenous PRN [name], MD      . sucralfate (CARAFATE) tablet 1 g  1 g Oral TID Cammie Sickle, MD   1 g at 04/12/16 0836  . thiamine (B-1) injection 100 mg  100 mg Intravenous Daily [name], MD   100 mg at 04/13/16 1805  . TPN (CLINIMIX-E) Adult   Intravenous Continuous TPN Lenis Noon, [facility]   Stopped at 04/13/16 1816      .  PHYSICAL EXAMINATION:  Vitals:   04/13/16 1019 04/13/16 1450  BP: 101/60 100/62  Pulse: 79 83  Resp:    Temp: 99.4 F (37.4 C) 99 F (37.2 C)   Filed Weights   04/11/16 0500 04/12/16 0500 04/13/16 0500  Weight: 94 lb 11.2 oz (43 kg) 108 lb 9.6 oz (49.3 kg) 108 lb (49 kg)    GENERAL: Cachectic appearing; mild distress because of nausea and vomiting. Accompanied by his daughter and/family EYES: no pallor or icterus OROPHARYNX: no thrush or ulceration. NECK: supple, no masses felt LYMPH:  no palpable lymphadenopathy in the cervical, axillary or inguinal regions LUNGS: decreased breath sounds to auscultation at bases and  No wheeze or crackles HEART/CVS: regular rate & rhythm and no murmurs; No lower extremity edema ABDOMEN: abdomen soft, non-tender and normal bowel sounds Musculoskeletal:no cyanosis of digits and no clubbing  PSYCH: alert & oriented x 3 with fluent speech NEURO: no focal motor/sensory deficits SKIN:  no rashes or significant lesions  LABORATORY DATA:  I have reviewed the data as listed Lab Results  Component Value Date   WBC 3.4 (L) 04/12/2016   HGB 10.7 (L) 04/12/2016   HCT 30.4 (L) 04/12/2016   MCV 84.0 04/12/2016   PLT 106 (L) 04/12/2016    Recent Labs  04/06/16 0014  04/11/16 1430 04/12/16 0820 04/12/16 2326 04/13/16 0810  NA 138  < > 141 139  --  138  K 4.1  < > 2.7* 2.8* 2.6* 3.4*  CL 113*  < > 114* 111  --  111  CO2 22  < > 23 26  --  25  GLUCOSE 141*  < > 121* 123*  --  110*  BUN 42*  < > 17 13  --  14  CREATININE 1.15  <  > 0.78 0.71  --  0.65  CALCIUM 8.4*  < > 7.1* 6.6*  --  6.6*  GFRNONAA >60  < > >60 >60  --  >60  GFRAA >60  < > >60 >60  --  >60  PROT 7.8  --   --  7.0  --  6.8  ALBUMIN 2.6*  --   --  2.6*  --  2.4*  AST 29  --   --  59*  --  57*  ALT 23  --   --  50  --  57  ALKPHOS 78  --   --  76  --  72  BILITOT 0.6  --   --  0.6  --  0.3  < > = values in this interval not displayed.  RADIOGRAPHIC STUDIES: I have personally reviewed the radiological images as listed and agreed with the findings in the report. Ct Chest W Contrast  Result Date: 03/15/2016 CLINICAL DATA:  Abnormal chest x-ray.  Terrible back pain. EXAM: CT CHEST WITH CONTRAST TECHNIQUE: Multidetector CT imaging of the chest was performed during intravenous contrast administration. CONTRAST:  68m ISOVUE-300 IOPAMIDOL (ISOVUE-300) INJECTION 61% COMPARISON:  Two-view chest x-ray 03/07/2016 FINDINGS: Cardiovascular: The heart size is normal. No significant coronary artery calcifications are present. There is no significant pericardial effusion. Mediastinum/Nodes: No significant mediastinal or axillary adenopathy is present. Atherosclerotic calcifications are present at the aortic arch. Lungs/Pleura: Biapical pleural parenchymal scarring is present. There is upward traction on the hila bilaterally. Emphysematous changes are noted. No focal nodule, mass, or airspace disease is evident. Upper Abdomen: Multiple hypodense lesions are present within the liver. The largest lesion is in segment 4 A measuring 14 mm. This may be a cyst. There are other more subtle hypodense lesions. Most worrisome is a far right lateral lesion in segment 7 on image 62 of series 2. This measures 15 mm. The visualized abdominal organs are otherwise unremarkable. Musculoskeletal: A large soft tissue lesion is centered along the posterior left ninth rib measuring 5.7 x 3.3 x 4.5 cm. There is no significant invasion of the spinal canal although the lesion does extend to the left  T8-9 foramen. There is tumor invasion and the posterior elements of T9 on the left. An additional more lateral left T9 lytic lesion and pathologic fracture is noted. Focal irregularity of the posterior left T12 rib the likely represents a metastatic lesion as well. The pathologic fractures present at T10 with extraosseous tumor extending into the spinal canal at T10 and to the right. There is tumor in both feet T9-10 and T10-11 foramen on the right. Marked osteopenia is present. There is lytic tumor involving the posterior elements on the right at T3 with probable involvement of the right T3-4 foramen. There is also a lytic lesion anteriorly within the T3 vertebral body without collapse. A far right T4 lesion measures 14 mm without collapse. In anterior lesion in T6 measures 14 mm. A 9 mm lesion is present within the right posterior element of T9. Infiltrative tumor is suspected posteriorly at T11 without collapse or extraosseous extension of tumor. Multiple other smaller spinous lesions are suspected. MRI of the thoracic spine without and with contrast would be useful for further evaluation. IMPRESSION: 1. Multiple lytic lesions throughout the thoracic spine and ribs as described suggesting metastatic disease. 2. The lesion evident on the chest x-ray corresponds with a 5.7 x 3.3 x 4.5 cm mass infiltrating the left T9 rib with extension to the left T8-9 foramen but no invasion of the foramen. 3.  4.1 x 3.1 cm exophytic mass lesion at T10 with pathologic fracture. The tumor extends into the spinal canal with moderate central canal stenosis and right greater than left foraminal narrowing at T9-10 and T10-11. 4. Prominent infiltrative tumor posteriorly at T11 without compression fracture. 5. Additional pathologic fracture within the more distal left ninth rib. 6. Marked osteopenia with probable hemangioma is an high suspicion for multiple additional lesions throughout the thoracic spine. Recommend MRI of the thoracic  spine without and with contrast for further evaluation 7. Multiple hypodense lesions within the liver also raise concern for metastatic disease. Dedicated CT of the abdomen and pelvis with contrast is recommended for further evaluation of metastatic disease and possible primary source. 8. Emphysema and biapical scarring in the lungs without evidence for primary tumor. These results were called by telephone at the time of interpretation on 03/15/2016 at 11:22 am to [facility], [place], who verbally acknowledged these results. Electronically Signed   By: San Morelle M.D.   On: 03/15/2016 11:44   Nm Pet Image Initial (pi) Whole Body  Result Date: 03/29/2016 CLINICAL DATA:  Initial treatment strategy for multiple myeloma. PET-CT was attempted on 03/22/2016, however the PET portion of the study could not be completed due to patient discomfort. Status post radiation therapy and chemotherapy 1 day prior with steroids. EXAM: NUCLEAR MEDICINE PET WHOLE BODY TECHNIQUE: 12.8 mCi F-18 FDG was injected intravenously. Full-ring PET imaging was performed from the vertex to the feet after the radiotracer. CT data was obtained and used for attenuation correction and anatomic localization. FASTING BLOOD GLUCOSE:  Value:  119 mg/dl COMPARISON:  03/22/2016 incomplete PET-CT and 03/15/2016 chest CT. FINDINGS: Head/Neck: No hypermetabolic lymph nodes in the neck. Chest: No hypermetabolic axillary, mediastinal or hilar nodes. Top-normal heart size. Atherosclerotic nonaneurysmal thoracic aorta. No pleural effusions. There are 4 mm pulmonary nodules in the anterior right upper lobe (series 3/image 96) and right middle lobe (series 3/image 147), below PET resolution. No acute consolidative airspace disease. Abdomen/Pelvis: No abnormal hypermetabolic activity within the liver, pancreas, adrenal glands, or spleen. No hypermetabolic lymph nodes in the abdomen or pelvis. There are 2 simple right liver cysts, largest 1.4 cm.  Atherosclerotic nonaneurysmal abdominal aorta. Skeleton: There are numerous lytic bone lesions with associated mild hypermetabolism throughout the spine, ribs and pelvis, several of which are expansile liver extraosseous soft tissue components. For example an expansile 5.1 x 3.1 cm posterior left ninth rib lytic bone lesion with max SUV 3.1 (series 3/ image 130), a right T10 expansile 4.3 x 3.3 cm lytic bone lesion with max SUV 2.9 and associated pathologic T10 compression fracture, and a right upper sacral 2.7 x 2.0 cm mildly expansile lytic bone lesion (series 3/ image 217) with adjacent pathologic vertical right sacral ala fracture with max SUV 3.0. Extremities: No hypermetabolic activity to suggest metastasis. FDG uptake in the left antecubital fossa/left forearm is injection related. IMPRESSION: 1. Mildly hypermetabolic lytic bone lesions throughout the axial skeleton, several of which are expansile with extra-osseous soft tissue components, consistent with multiple myeloma. Pathologic fractures in the T10 vertebral body and right sacral ala. 2. Two 4 mm right pulmonary nodules, below PET resolution. A follow-up unenhanced chest CT could be obtained in 3-6 months. 3. Aortic atherosclerosis. Electronically Signed   By: Ilona Sorrel M.D.   On: 03/29/2016 10:25   Nm Pet Image Initial (pi) Whole Body  Result Date: 03/22/2016 CLINICAL DATA:  Subsequent treatment strategy for multiple myeloma EXAM: NUCLEAR MEDICINE PET WHOLE BODY TECHNIQUE:  14.2 mCi F-18 FDG was injected intravenously. CT data was obtained and used for attenuation correction and anatomic localization only. (This was not acquired as a diagnostic CT examination.) Additional exam technical data entered on technologist worksheet. FASTING BLOOD GLUCOSE:  Value: 105 mg/dl COMPARISON:  Chest CT 03/15/2016. FINDINGS: Comment: Per report from the technologist, the patient was in considerable pain during the examination. Despite efforts to maintain an  acceptable level of comfort for the patient, the patient was unable to tolerate the entire examination. CT images were obtained, but PET images were discontinued after acquisition through only the head. PET images through the neck, chest, abdomen, pelvis and lower extremities were not obtained. Accordingly, the examination will be interpreted as a noncontrast CT of the head, neck, chest, abdomen, pelvis, skeleton and extremities. Head/Neck: No definite intracranial mass. No hydrocephalus. No aggressive appearing osseous lesions are noted in the skull. No definite cervical lymphadenopathy noted on the noncontrast CT images. Chest: Heart size is normal. There is no significant pericardial fluid, thickening or pericardial calcification. Aortic atherosclerosis, without definite aneurysm. No definite lymphadenopathy noted in the mediastinal or hilar nodal stations. Please note that accurate exclusion of hilar adenopathy is limited on noncontrast CT scans. Esophagus is unremarkable in appearance. No axillary lymphadenopathy. Mass effect upon the left lower lobe (discussed below) related to expansile left ninth rib lesion. No suspicious appearing pulmonary nodules or masses. Mild bilateral apical nodular pleuroparenchymal thickening, most compatible with chronic post infectious or inflammatory scarring. No acute consolidative airspace disease. No pleural effusions. Abdomen/Pelvis: Several low-attenuation lesions are again noted in the liver, largest of which is in the central aspect of segment 8 measuring 1.4 x 0.9 cm. These are incompletely characterized on today's noncontrast CT examination, but are favored to represent tiny cysts. Gallbladder is poorly visualized and may be decompressed or surgically absent (no surgical clips are noted in the gallbladder fossa). Unenhanced appearance of the pancreas, spleen, bilateral adrenal glands and bilateral kidneys is unremarkable. No hydroureteronephrosis. Unenhanced appearance  of the urinary bladder is normal. No pathologic dilatation of small bowel or colon. Skeleton/Extremities: Again noted are expansile lytic lesions involving the left ninth rib and T10 vertebral body where there is a previously recognized pathologic compression fracture. The lesion of the left ninth rib currently measures approximately 3.2 x 6.1 cm (image 130 of series 3) which is similar to the recent chest CT. Lesion in the right side of the T10 vertebral body extends into the adjacent paraspinal soft tissues and currently measures 3.4 x 3.5 cm, also similar to the prior examination. Large lytic lesion in the anterior aspect of the L4 vertebral body measuring 3.2 x 3.1 cm. Large lytic lesion in the right side of the sacrum just medial to the sacroiliac joint measuring 1.9 x 2.3 cm. Lytic lesion in the right ilium measuring 2.9 x 1.4 cm. Several other smaller lytic lesions are also noted. IMPRESSION: 1. Widespread lytic lesions throughout the visualized axial and appendicular skeleton, compatible with the reported clinical history of multiple myeloma. This is most notable for the lytic lesion with pathologic compression fracture in the T10 vertebral body. No definite extra-skeletal involvement noted on today's examination, which was limited by lack of acquisition of diagnostic PET images secondary to patient pain during the examination. 2. Aortic atherosclerosis. 3. Additional incidental findings, as above. Electronically Signed   By: Vinnie Langton M.D.   On: 03/22/2016 14:41   Ct Biopsy  Result Date: 03/25/2016 INDICATION: Multiple bony lytic lesions.  Possible multiple myeloma.  EXAM: CT BIOPSY MEDICATIONS: None. ANESTHESIA/SEDATION: Moderate (conscious) sedation was employed during this procedure. A total of Versed 1.0 mg and Fentanyl 50 mcg was administered intravenously. Moderate Sedation Time: 18 minutes. The patient's level of consciousness and vital signs were monitored continuously by radiology nursing  throughout the procedure under my direct supervision. COMPLICATIONS: None immediate. PROCEDURE: Informed written consent was obtained from the patient after a thorough discussion of the procedural risks, benefits and alternatives. All questions were addressed. Maximal Sterile Barrier Technique was utilized including mask, sterile gloves, sterile drape, hand hygiene and skin antiseptic. A timeout was performed prior to the initiation of the procedure. The patient was placed prone on the CT scanner. Posterior flank region was prepped and draped using sterile technique. Local anesthetic was applied. Under CT guidance, 22 gauge spinal needle is directed toward the posterior margin of the right iliac bone. Lidocaine was injected subperiosteally. Needle was removed. Then, also under CT guidance, trocar was directed through posterior cortex of right iliac bone. Bone marrow aspirations were obtained with and without heparin, and given to the pathology technologist who was present at that time. Then, bone marrow core biopsy was obtained, and this sample was also given to the pathology technologist. Needle was removed and appropriate dressing was applied. IMPRESSION: Under CT guidance, percutaneous bone marrow aspiration and core biopsy of right iliac bone was performed. Electronically Signed   By: Marijo Conception, M.D.   On: 03/25/2016 10:14   Dg Chest Port 1 View  Result Date: 04/11/2016 CLINICAL DATA:  Status post PICC placement. EXAM: PORTABLE CHEST 1 VIEW COMPARISON:  04/06/2016. FINDINGS: Mildly enlarged cardiac silhouette with an interval mild decrease in size. Right PICC tip in the region of the upper right atrium. Small amount of linear density in the left lower lobe. Previously noted T 90 degrees 10 vertebral compression fracture. Possible additional vertebral compression fractures, difficult to assess in the frontal projection. Diffuse osteopenia. Mild scoliosis. IMPRESSION: 1. Right PICC tip in the upper right  atrium. This could be retracted approximately 6 mm to place it at the superior cavoatrial junction. 2. Small amount of linear atelectasis and probable scarring in the left lower lobe. 3. Mild cardiomegaly with improvement. 4. T10 and possible additional thoracic vertebral compression deformities. Electronically Signed   By: Claudie Revering M.D.   On: 04/11/2016 15:49   Dg Chest Portable 1 View  Result Date: 04/06/2016 CLINICAL DATA:  Cough, dyspnea. History of multiple myeloma, T10 pathologic fracture. EXAM: PORTABLE CHEST 1 VIEW COMPARISON:  PET-CT March 29, 2016 FINDINGS: Cardiac silhouette is moderately enlarged, mediastinal silhouette is nonsuspicious, mildly calcified aortic knob. Increased lung volumes. No pleural effusion or focal consolidation. No pneumothorax. Apical pleural thickening. Osteopenia. Cachexia. Bold severe T10 compression fracture. IMPRESSION: Moderate cardiomegaly.  Increased lung volumes. Osteopenia with severe T10 fracture corresponding known pathologic fracture. Electronically Signed   By: Elon Alas M.D.   On: 04/06/2016 03:07   Dg Chest Port 1v Same Day  Result Date: 04/11/2016 CLINICAL DATA:  PICC line adjustment EXAM: PORTABLE CHEST 1 VIEW COMPARISON:  Portable exam 1636 hours compared to 1527 hours FINDINGS: Tip of RIGHT arm PICC line now projects over SVC above cavoatrial junction. Enlargement of cardiac silhouette. Atherosclerotic calcification aorta. Pulmonary vascularity normal. Lungs appear unchanged, with atelectasis versus scarring at LEFT base and question underlying emphysematous changes. No pleural effusion or pneumothorax. Bones demineralized. IMPRESSION: Tip of RIGHT arm PICC line now projects over SVC. Enlargement of cardiac silhouette with suspected emphysematous changes and  LEFT lower lobe atelectasis versus scarring. Electronically Signed   By: Lavonia Dana M.D.   On: 04/11/2016 16:54   Dg Bone Survey Met  Result Date: 03/16/2016 CLINICAL DATA:   Multiple myeloma EXAM: METASTATIC BONE SURVEY COMPARISON:  03/15/2016 FINDINGS: Lateral view of the skull shows no lytic or blastic lesion. Lateral view of the cervical spine shows diffuse osteopenia. No lytic or blastic lesions. Mild disc space flattening at C3-C4-C4-C5 and C5-C6 level. Frontal view bilateral shoulder shows no lytic or blastic lesion. Frontal view of the neck shows no lytic or blastic lesion. Bilateral humerus demonstrates no lytic or blastic lesion. Bilateral forearm shows no lytic or blastic lesions. There is dextroscoliosis lower thoracic spine. Mild levoscoliosis lumbar spine. Significant compression fracture of T10 vertebral body suspicious for pathologic fracture. Expansile lesion of the left eighth rib suspicious for metastatic disease. Frontal view of the pelvis shows no lytic or blastic lesions. Frontal view bilateral hip and proximal femur is unremarkable. Frontal view bilateral femur shows no lytic or blastic lesions. Bilateral tibia and fibula shows no lytic or blastic lesions. IMPRESSION: 1. There is probable pathologic fracture of T10 vertebral body suspicious for bony lesion. Expansile lesion of left posterior eighth rib suspicious for metastatic disease. Please see patient's CT scan of the chest from yesterday. Electronically Signed   By: Lahoma Crocker M.D.   On: 03/16/2016 17:00    ASSESSMENT & PLAN:   # 70 year old male patient with newly diagnosed multiple myeloma currently admitted to the hospital for intractable nausea and vomiting/ poor by mouth intake.  # Intractable nausea vomiting/Hicupps/likely secondary to gastritis-currently off Protonix. Improvement noted.   # Failure to thrive /Poor by mouth intake. Nutrition/cachexia- on TPN.  # Anemia/thrombocytopenia- and due to multiple myeloma; status post terms of transfusion hemoglobin stable around 9-10. Platelets are 100-   # Multiple myeloma/anemia mild thrombocytopenia- newly diagnosed on Velcade and dexamethasone;  currently treatment on hold. We will plan treatment next week  # Severe hypokalemia potassium 2.7; on supplementation  # Pain from malignancy- continue hydrocodone as needed; fentanyl patch  Discussed with Dr.Konidena. Spoke to patient's son-in-law/[name] [phone].    Cammie Sickle, MD 04/13/2016 7:04 PM

## 2016-04-13 NOTE — Discharge Summary (Signed)
Francisco [name], is a 70 y.o. male  DOB 01-04-1946  MRN [id number].  Admission date:  04/05/2016  Admitting Physician  Harrie Foreman, MD  Discharge Date:  04/13/2016   Primary MD  Alfonse Flavors, MD  Recommendations for primary care physician for things to follow:   follow Up with Dr. Rogue Bussing  in 1 week   Admission Diagnosis  Emesis - CA patient   Discharge Diagnosis  Emesis - CA patient    Active Problems:   Intractable vomiting with nausea   Anorexia   Abnormal loss of weight      Past Medical History:  Diagnosis Date  . Anemia   . Cancer (HCC)    Bone metastasis  . Constipation   . Hearing loss   . Hoarse voice quality   . Liver lesion   . Malaria 2003  . Multiple myeloma ([place]) 03/22/2016   Per Dr. Rogue Bussing on his PET order.  . Tuberculosis 1985    Past Surgical History:  Procedure Laterality Date  . RETINAL DETACHMENT SURGERY  2006       History of present illness and  Hospital Course:     Kindly see H&P for history of present illness and admission details, please review complete Labs, Consult reports and Test reports for all details in brief  HPI  from the history and physical done on the day of admission  70 year old male patient with history of multiple myeloma admitted because of intractable nausea, vomiting . Noted to medical service for acute gastritis.  Hospital Course  #1 intractable nausea, vomiting secondary to severe acute gastritis due to chemotherapy that he is receiving for multiple myeloma. Patient started on IV fluids, IV Protonix drip, IV nausea medicines, IV Compazine for hiccups. Patient received Protonix drip for 3 days, seen by gastroenterology. Patient continued to have decreased by mouth intake, failure to thrive. Patient started on TPN yesterday. The  plan is to discharge him home today with TPN, we will arrange the home health nurse, physical therapy, home health aide. #2 multiple myeloma with metastases to ask: Getting radiation therapy. Patient also is on fentanyl patch at 12 g every 72 hours. She is taking care of his pain in the back. #3 severe hypokalemia, hypophosphatemia: Patient refusing to get the blood work done . Card the potassium and phosphate replaced. #4 anorexia: Patient is on Marinol  #4  anemia, thrombocytopenia secondary to multiple myeloma. Patient received 2 units of packed RBC transfusion this hospitalization.  #5 therapy saw the patient because of difficulty taking the liquids. Patient has no pain while swallowing. Patient is given the option of thickened liquids to prevent hiccups. Patient can take regular diet with nectar thick liquids #6 depression: Started on Zoloft 25 MG daily. #7. constipation use stool softeners as needed Moderate hoarseness of voice: Seen by ENT, patient can follow up with ENT as an outpatient.   D.ischarge Condition: stable   Follow UP  Follow-up Information    Alfonse Flavors, MD .   Specialty:  Family Medicine Contact information: [place] [address] [phone]        Cammie Sickle, MD. Go on 04/21/2016.   Specialties:  Internal Medicine, Oncology Why:  at 2:15 p.m. Contact information: [place] [address] [phone]             Discharge Instructions  and  Discharge Medications     Discharge Instructions    Discharge instructions  Complete by:  As directed    Continue TPN   Face-to-face encounter (required for Medicare/Medicaid patients)    Complete by:  As directed    I Yanina Knupp certify that this patient is under my care and that I, or a nurse practitioner or physician's assistant working with me, had a face-to-face encounter that meets the physician face-to-face encounter requirements with  this patient on 04/13/2016. The encounter with the patient was in whole, or in part for the following medical condition(s) which is the primary reason for home health care  Multiple myeloma Poor po intake Deconditioning Anorexia/cachexia  Needs TPN as per [facility]   The encounter with the patient was in whole, or in part, for the following medical condition, which is the primary reason for home health care:  whole   I certify that, based on my findings, the following services are medically necessary home health services:   Nursing Physical therapy     Reason for Medically Necessary Home Health Services:  Therapy- Personnel officer, Public librarian   My clinical findings support the need for the above services:  Unable to leave home safely without assistance and/or assistive device   Further, I certify that my clinical findings support that this patient is homebound due to:  Ambulates short distances less than 300 feet   Home Health    Complete by:  As directed    To provide the following care/treatments:   [place]         Medication List    STOP taking these medications   lenalidomide 20 MG capsule Commonly known as:  REVLIMID     TAKE these medications   acyclovir 400 MG tablet Commonly known as:  ZOVIRAX Take 1 tablet (400 mg total) by mouth 2 (two) times daily.   alum & mag hydroxide-simeth 200-200-20 MG/5ML suspension Commonly known as:  MAALOX/MYLANTA Take 30 mLs by mouth every 4 (four) hours as needed for indigestion or heartburn.   cholecalciferol 1000 units tablet Commonly known as:  VITAMIN D Take 1,000 Units by mouth daily.   dronabinol 2.5 MG capsule Commonly known as:  MARINOL Take 1 capsule (2.5 mg total) by mouth 2 (two) times daily before lunch and supper.   fentaNYL 12 MCG/HR Commonly known as:  DURAGESIC - dosed mcg/hr Place 1 patch (12.5 mcg total) onto the skin every 3 (three) days. Start taking on:  04/15/2016    HYDROcodone-acetaminophen 5-325 MG tablet Commonly known as:  NORCO/VICODIN Take 1 tablet by mouth every 4 (four) hours as needed for moderate pain.   lactulose 10 GM/15ML solution Commonly known as:  CHRONULAC Take 30 mLs (20 g total) by mouth 2 (two) times daily as needed for mild constipation, moderate constipation or severe constipation.   magnesium hydroxide 400 MG/5ML suspension Commonly known as:  MILK OF MAGNESIA Take 5 mLs by mouth daily as needed for mild constipation.   ondansetron 8 MG tablet Commonly known as:  ZOFRAN Take 1 tablet (8 mg total) by mouth every 8 (eight) hours as needed for nausea or vomiting.   prochlorperazine 10 MG tablet Commonly known as:  COMPAZINE Take 1 tablet (10 mg total) by mouth every 6 (six) hours as needed for nausea or vomiting.   sertraline 25 MG tablet Commonly known as:  ZOLOFT Take 1 tablet (25 mg total) by mouth daily.   sucralfate 1 g tablet Commonly known as:  CARAFATE Take 1 tablet (1 g total) by mouth 4 (four)  times daily -  with meals and at bedtime.   vitamin B-12 1000 MCG tablet Commonly known as:  CYANOCOBALAMIN Take 1,000 mcg by mouth daily.   Vitamin D (Ergocalciferol) 50000 units Caps capsule Commonly known as:  DRISDOL Take 50,000 Units by mouth every 7 (seven) days.         Diet and Activity recommendation: See Discharge Instructions above   Consults obtained - oncology,ENT,GI   Major procedures and Radiology Reports - PLEASE review detailed and final reports for all details, in brief -      Ct Chest W Contrast  Result Date: 03/15/2016 CLINICAL DATA:  Abnormal chest x-ray.  Terrible back pain. EXAM: CT CHEST WITH CONTRAST TECHNIQUE: Multidetector CT imaging of the chest was performed during intravenous contrast administration. CONTRAST:  64m ISOVUE-300 IOPAMIDOL (ISOVUE-300) INJECTION 61% COMPARISON:  Two-view chest x-ray 03/07/2016 FINDINGS: Cardiovascular: The heart size is normal. No significant  coronary artery calcifications are present. There is no significant pericardial effusion. Mediastinum/Nodes: No significant mediastinal or axillary adenopathy is present. Atherosclerotic calcifications are present at the aortic arch. Lungs/Pleura: Biapical pleural parenchymal scarring is present. There is upward traction on the hila bilaterally. Emphysematous changes are noted. No focal nodule, mass, or airspace disease is evident. Upper Abdomen: Multiple hypodense lesions are present within the liver. The largest lesion is in segment 4 A measuring 14 mm. This may be a cyst. There are other more subtle hypodense lesions. Most worrisome is a far right lateral lesion in segment 7 on image 62 of series 2. This measures 15 mm. The visualized abdominal organs are otherwise unremarkable. Musculoskeletal: A large soft tissue lesion is centered along the posterior left ninth rib measuring 5.7 x 3.3 x 4.5 cm. There is no significant invasion of the spinal canal although the lesion does extend to the left T8-9 foramen. There is tumor invasion and the posterior elements of T9 on the left. An additional more lateral left T9 lytic lesion and pathologic fracture is noted. Focal irregularity of the posterior left T12 rib the likely represents a metastatic lesion as well. The pathologic fractures present at T10 with extraosseous tumor extending into the spinal canal at T10 and to the right. There is tumor in both feet T9-10 and T10-11 foramen on the right. Marked osteopenia is present. There is lytic tumor involving the posterior elements on the right at T3 with probable involvement of the right T3-4 foramen. There is also a lytic lesion anteriorly within the T3 vertebral body without collapse. A far right T4 lesion measures 14 mm without collapse. In anterior lesion in T6 measures 14 mm. A 9 mm lesion is present within the right posterior element of T9. Infiltrative tumor is suspected posteriorly at T11 without collapse or  extraosseous extension of tumor. Multiple other smaller spinous lesions are suspected. MRI of the thoracic spine without and with contrast would be useful for further evaluation. IMPRESSION: 1. Multiple lytic lesions throughout the thoracic spine and ribs as described suggesting metastatic disease. 2. The lesion evident on the chest x-ray corresponds with a 5.7 x 3.3 x 4.5 cm mass infiltrating the left T9 rib with extension to the left T8-9 foramen but no invasion of the foramen. 3. 4.1 x 3.1 cm exophytic mass lesion at T10 with pathologic fracture. The tumor extends into the spinal canal with moderate central canal stenosis and right greater than left foraminal narrowing at T9-10 and T10-11. 4. Prominent infiltrative tumor posteriorly at T11 without compression fracture. 5. Additional pathologic fracture within the  more distal left ninth rib. 6. Marked osteopenia with probable hemangioma is an high suspicion for multiple additional lesions throughout the thoracic spine. Recommend MRI of the thoracic spine without and with contrast for further evaluation 7. Multiple hypodense lesions within the liver also raise concern for metastatic disease. Dedicated CT of the abdomen and pelvis with contrast is recommended for further evaluation of metastatic disease and possible primary source. 8. Emphysema and biapical scarring in the lungs without evidence for primary tumor. These results were called by telephone at the time of interpretation on 03/15/2016 at 11:22 am to [facility] [place], who verbally acknowledged these results. Electronically Signed   By: Marin Roberts M.D.   On: 03/15/2016 11:44   Nm Pet Image Initial (pi) Whole Body  Result Date: 03/29/2016 CLINICAL DATA:  Initial treatment strategy for multiple myeloma. PET-CT was attempted on 03/22/2016, however the PET portion of the study could not be completed due to patient discomfort. Status post radiation therapy and chemotherapy 1 day prior with steroids.  EXAM: NUCLEAR MEDICINE PET WHOLE BODY TECHNIQUE: 12.8 mCi F-18 FDG was injected intravenously. Full-ring PET imaging was performed from the vertex to the feet after the radiotracer. CT data was obtained and used for attenuation correction and anatomic localization. FASTING BLOOD GLUCOSE:  Value:  119 mg/dl COMPARISON:  43/15/1128 incomplete PET-CT and 03/15/2016 chest CT. FINDINGS: Head/Neck: No hypermetabolic lymph nodes in the neck. Chest: No hypermetabolic axillary, mediastinal or hilar nodes. Top-normal heart size. Atherosclerotic nonaneurysmal thoracic aorta. No pleural effusions. There are 4 mm pulmonary nodules in the anterior right upper lobe (series 3/image 96) and right middle lobe (series 3/image 147), below PET resolution. No acute consolidative airspace disease. Abdomen/Pelvis: No abnormal hypermetabolic activity within the liver, pancreas, adrenal glands, or spleen. No hypermetabolic lymph nodes in the abdomen or pelvis. There are 2 simple right liver cysts, largest 1.4 cm. Atherosclerotic nonaneurysmal abdominal aorta. Skeleton: There are numerous lytic bone lesions with associated mild hypermetabolism throughout the spine, ribs and pelvis, several of which are expansile liver extraosseous soft tissue components. For example an expansile 5.1 x 3.1 cm posterior left ninth rib lytic bone lesion with max SUV 3.1 (series 3/ image 130), a right T10 expansile 4.3 x 3.3 cm lytic bone lesion with max SUV 2.9 and associated pathologic T10 compression fracture, and a right upper sacral 2.7 x 2.0 cm mildly expansile lytic bone lesion (series 3/ image 217) with adjacent pathologic vertical right sacral ala fracture with max SUV 3.0. Extremities: No hypermetabolic activity to suggest metastasis. FDG uptake in the left antecubital fossa/left forearm is injection related. IMPRESSION: 1. Mildly hypermetabolic lytic bone lesions throughout the axial skeleton, several of which are expansile with extra-osseous soft  tissue components, consistent with multiple myeloma. Pathologic fractures in the T10 vertebral body and right sacral ala. 2. Two 4 mm right pulmonary nodules, below PET resolution. A follow-up unenhanced chest CT could be obtained in 3-6 months. 3. Aortic atherosclerosis. Electronically Signed   By: Delbert Phenix M.D.   On: 03/29/2016 10:25   Nm Pet Image Initial (pi) Whole Body  Result Date: 03/22/2016 CLINICAL DATA:  Subsequent treatment strategy for multiple myeloma EXAM: NUCLEAR MEDICINE PET WHOLE BODY TECHNIQUE: 14.2 mCi F-18 FDG was injected intravenously. CT data was obtained and used for attenuation correction and anatomic localization only. (This was not acquired as a diagnostic CT examination.) Additional exam technical data entered on technologist worksheet. FASTING BLOOD GLUCOSE:  Value: 105 mg/dl COMPARISON:  Chest CT 49/99/3300. FINDINGS: Comment:  Per report from the technologist, the patient was in considerable pain during the examination. Despite efforts to maintain an acceptable level of comfort for the patient, the patient was unable to tolerate the entire examination. CT images were obtained, but PET images were discontinued after acquisition through only the head. PET images through the neck, chest, abdomen, pelvis and lower extremities were not obtained. Accordingly, the examination will be interpreted as a noncontrast CT of the head, neck, chest, abdomen, pelvis, skeleton and extremities. Head/Neck: No definite intracranial mass. No hydrocephalus. No aggressive appearing osseous lesions are noted in the skull. No definite cervical lymphadenopathy noted on the noncontrast CT images. Chest: Heart size is normal. There is no significant pericardial fluid, thickening or pericardial calcification. Aortic atherosclerosis, without definite aneurysm. No definite lymphadenopathy noted in the mediastinal or hilar nodal stations. Please note that accurate exclusion of hilar adenopathy is limited on  noncontrast CT scans. Esophagus is unremarkable in appearance. No axillary lymphadenopathy. Mass effect upon the left lower lobe (discussed below) related to expansile left ninth rib lesion. No suspicious appearing pulmonary nodules or masses. Mild bilateral apical nodular pleuroparenchymal thickening, most compatible with chronic post infectious or inflammatory scarring. No acute consolidative airspace disease. No pleural effusions. Abdomen/Pelvis: Several low-attenuation lesions are again noted in the liver, largest of which is in the central aspect of segment 8 measuring 1.4 x 0.9 cm. These are incompletely characterized on today's noncontrast CT examination, but are favored to represent tiny cysts. Gallbladder is poorly visualized and may be decompressed or surgically absent (no surgical clips are noted in the gallbladder fossa). Unenhanced appearance of the pancreas, spleen, bilateral adrenal glands and bilateral kidneys is unremarkable. No hydroureteronephrosis. Unenhanced appearance of the urinary bladder is normal. No pathologic dilatation of small bowel or colon. Skeleton/Extremities: Again noted are expansile lytic lesions involving the left ninth rib and T10 vertebral body where there is a previously recognized pathologic compression fracture. The lesion of the left ninth rib currently measures approximately 3.2 x 6.1 cm (image 130 of series 3) which is similar to the recent chest CT. Lesion in the right side of the T10 vertebral body extends into the adjacent paraspinal soft tissues and currently measures 3.4 x 3.5 cm, also similar to the prior examination. Large lytic lesion in the anterior aspect of the L4 vertebral body measuring 3.2 x 3.1 cm. Large lytic lesion in the right side of the sacrum just medial to the sacroiliac joint measuring 1.9 x 2.3 cm. Lytic lesion in the right ilium measuring 2.9 x 1.4 cm. Several other smaller lytic lesions are also noted. IMPRESSION: 1. Widespread lytic lesions  throughout the visualized axial and appendicular skeleton, compatible with the reported clinical history of multiple myeloma. This is most notable for the lytic lesion with pathologic compression fracture in the T10 vertebral body. No definite extra-skeletal involvement noted on today's examination, which was limited by lack of acquisition of diagnostic PET images secondary to patient pain during the examination. 2. Aortic atherosclerosis. 3. Additional incidental findings, as above. Electronically Signed   By: Vinnie Langton M.D.   On: 03/22/2016 14:41   Ct Biopsy  Result Date: 03/25/2016 INDICATION: Multiple bony lytic lesions.  Possible multiple myeloma. EXAM: CT BIOPSY MEDICATIONS: None. ANESTHESIA/SEDATION: Moderate (conscious) sedation was employed during this procedure. A total of Versed 1.0 mg and Fentanyl 50 mcg was administered intravenously. Moderate Sedation Time: 18 minutes. The patient's level of consciousness and vital signs were monitored continuously by radiology nursing throughout the procedure under my  direct supervision. COMPLICATIONS: None immediate. PROCEDURE: Informed written consent was obtained from the patient after a thorough discussion of the procedural risks, benefits and alternatives. All questions were addressed. Maximal Sterile Barrier Technique was utilized including mask, sterile gloves, sterile drape, hand hygiene and skin antiseptic. A timeout was performed prior to the initiation of the procedure. The patient was placed prone on the CT scanner. Posterior flank region was prepped and draped using sterile technique. Local anesthetic was applied. Under CT guidance, 22 gauge spinal needle is directed toward the posterior margin of the right iliac bone. Lidocaine was injected subperiosteally. Needle was removed. Then, also under CT guidance, trocar was directed through posterior cortex of right iliac bone. Bone marrow aspirations were obtained with and without heparin, and given  to the pathology technologist who was present at that time. Then, bone marrow core biopsy was obtained, and this sample was also given to the pathology technologist. Needle was removed and appropriate dressing was applied. IMPRESSION: Under CT guidance, percutaneous bone marrow aspiration and core biopsy of right iliac bone was performed. Electronically Signed   By: Marijo Conception, M.D.   On: 03/25/2016 10:14   Dg Chest Port 1 View  Result Date: 04/11/2016 CLINICAL DATA:  Status post PICC placement. EXAM: PORTABLE CHEST 1 VIEW COMPARISON:  04/06/2016. FINDINGS: Mildly enlarged cardiac silhouette with an interval mild decrease in size. Right PICC tip in the region of the upper right atrium. Small amount of linear density in the left lower lobe. Previously noted T 90 degrees 10 vertebral compression fracture. Possible additional vertebral compression fractures, difficult to assess in the frontal projection. Diffuse osteopenia. Mild scoliosis. IMPRESSION: 1. Right PICC tip in the upper right atrium. This could be retracted approximately 6 mm to place it at the superior cavoatrial junction. 2. Small amount of linear atelectasis and probable scarring in the left lower lobe. 3. Mild cardiomegaly with improvement. 4. T10 and possible additional thoracic vertebral compression deformities. Electronically Signed   By: Claudie Revering M.D.   On: 04/11/2016 15:49   Dg Chest Portable 1 View  Result Date: 04/06/2016 CLINICAL DATA:  Cough, dyspnea. History of multiple myeloma, T10 pathologic fracture. EXAM: PORTABLE CHEST 1 VIEW COMPARISON:  PET-CT March 29, 2016 FINDINGS: Cardiac silhouette is moderately enlarged, mediastinal silhouette is nonsuspicious, mildly calcified aortic knob. Increased lung volumes. No pleural effusion or focal consolidation. No pneumothorax. Apical pleural thickening. Osteopenia. Cachexia. Bold severe T10 compression fracture. IMPRESSION: Moderate cardiomegaly.  Increased lung volumes.  Osteopenia with severe T10 fracture corresponding known pathologic fracture. Electronically Signed   By: Elon Alas M.D.   On: 04/06/2016 03:07   Dg Chest Port 1v Same Day  Result Date: 04/11/2016 CLINICAL DATA:  PICC line adjustment EXAM: PORTABLE CHEST 1 VIEW COMPARISON:  Portable exam 1636 hours compared to 1527 hours FINDINGS: Tip of RIGHT arm PICC line now projects over SVC above cavoatrial junction. Enlargement of cardiac silhouette. Atherosclerotic calcification aorta. Pulmonary vascularity normal. Lungs appear unchanged, with atelectasis versus scarring at LEFT base and question underlying emphysematous changes. No pleural effusion or pneumothorax. Bones demineralized. IMPRESSION: Tip of RIGHT arm PICC line now projects over SVC. Enlargement of cardiac silhouette with suspected emphysematous changes and LEFT lower lobe atelectasis versus scarring. Electronically Signed   By: Lavonia Dana M.D.   On: 04/11/2016 16:54   Dg Bone Survey Met  Result Date: 03/16/2016 CLINICAL DATA:  Multiple myeloma EXAM: METASTATIC BONE SURVEY COMPARISON:  03/15/2016 FINDINGS: Lateral view of the skull shows no lytic  or blastic lesion. Lateral view of the cervical spine shows diffuse osteopenia. No lytic or blastic lesions. Mild disc space flattening at C3-C4-C4-C5 and C5-C6 level. Frontal view bilateral shoulder shows no lytic or blastic lesion. Frontal view of the neck shows no lytic or blastic lesion. Bilateral humerus demonstrates no lytic or blastic lesion. Bilateral forearm shows no lytic or blastic lesions. There is dextroscoliosis lower thoracic spine. Mild levoscoliosis lumbar spine. Significant compression fracture of T10 vertebral body suspicious for pathologic fracture. Expansile lesion of the left eighth rib suspicious for metastatic disease. Frontal view of the pelvis shows no lytic or blastic lesions. Frontal view bilateral hip and proximal femur is unremarkable. Frontal view bilateral femur shows no  lytic or blastic lesions. Bilateral tibia and fibula shows no lytic or blastic lesions. IMPRESSION: 1. There is probable pathologic fracture of T10 vertebral body suspicious for bony lesion. Expansile lesion of left posterior eighth rib suspicious for metastatic disease. Please see patient's CT scan of the chest from yesterday. Electronically Signed   By: Lahoma Crocker M.D.   On: 03/16/2016 17:00    Micro Results    No results found for this or any previous visit (from the past 240 hour(s)).     Today   Subjective:   Francisco [name] today has no pain  Related  complaints. Very cachectic.  Objective:   Blood pressure 101/60, pulse 79, temperature 99.4 F (37.4 C), temperature source Oral, resp. rate 16, height _0  (1.778 m), weight 49 kg (108 lb), SpO2 100 %.   Intake/Output Summary (Last 24 hours) at 04/13/16 1241 Last data filed at 04/13/16 0800  Gross per 24 hour  Intake             1012 ml  Output                0 ml  Net             1012 ml    Exam Awake Alert, Oriented x 3, No new F.N deficits, Normal affect [place].AT,PERRAL Supple Neck,No JVD, No cervical lymphadenopathy appriciated.  Symmetrical Chest wall movement, Good air movement bilaterally, CTAB RRR,No Gallops,Rubs or new Murmurs, No Parasternal Heave +ve B.Sounds, Abd Soft, Non tender, No organomegaly appriciated, No rebound -guarding or rigidity. No Cyanosis, Clubbing or edema, No new Rash or bruise  Data Review   CBC w Diff: Lab Results  Component Value Date   WBC 3.4 (L) 04/12/2016   HGB 10.7 (L) 04/12/2016   HCT 30.4 (L) 04/12/2016   PLT 106 (L) 04/12/2016   LYMPHOPCT 7 04/12/2016   MONOPCT 7 04/12/2016   EOSPCT 1 04/12/2016   BASOPCT 0 04/12/2016    CMP: Lab Results  Component Value Date   NA 138 04/13/2016   K 3.4 (L) 04/13/2016   CL 111 04/13/2016   CO2 25 04/13/2016   BUN 14 04/13/2016   CREATININE 0.65 04/13/2016   PROT 6.8 04/13/2016   ALBUMIN 2.4 (L) 04/13/2016   BILITOT 0.3  04/13/2016   ALKPHOS 72 04/13/2016   AST 57 (H) 04/13/2016   ALT 57 04/13/2016  .   Total Time in preparing paper work, data evaluation and todays exam - 51 minutes  Jasime Westergren M.D on 04/13/2016 at 12:41 PM    Note: This dictation was prepared with Dragon dictation along with smaller phrase technology. Any transcriptional errors that result from this process are unintentional.

## 2016-04-13 NOTE — Care Management (Signed)
Discharge to home today per Dr. Vianne Bulls. Spoke with family at the bedside. Discussed Home Health agencies. [facility] for nursing services in the home. [facility] will be supplying the TPN. Family requested a wheelchair for home. Requested transportation per [employer]. Shelbie Ammons RN MSN CCM Care Management [phone]

## 2016-04-13 NOTE — Progress Notes (Signed)
Nutrition Follow-up  DOCUMENTATION CODES:   Severe malnutrition in context of chronic illness  INTERVENTION:  Continue Clinimix 5/15 E @ 30mL/hr Continue Magic cup TID with meals, each supplement provides 290 kcal and 9 grams of protein Monitor lytes closely: Phos 1.2 (high risk of mortality) K 3.4 (pharmacy believes may be falsely high Pt is refeeding currently, continue to monitor lytes daily Continue Marinol BID  NUTRITION DIAGNOSIS:   Malnutrition related to chronic illness as evidenced by severe depletion of muscle mass, severe depletion of body fat, percent weight loss. -ongoing  GOAL:   Patient will meet greater than or equal to 90% of their needs -progressing with TPN  MONITOR:   PO intake, I & O's, Supplement acceptance, Labs, Weight trends  REASON FOR ASSESSMENT:   Other (Comment) (Low BMI)    ASSESSMENT:   The patient with past medical history significant for multiple myeloma presents emergency department complaining of recurrent nausea and vomiting. The patient recently underwent chemotherapy and received intravenous fluid in clinic.  Pt potentially to be discharged today pending [facility]. If he is not, with electrolytes still unstable, continue TPN @ half goal rate PO intake is still minimal, no breakfast this morning. Continues to be difficult about lab draws. Labs and medications reviewed, CBGs acceptable  Diet Order:  Diet regular Room service appropriate? Yes with Assist; Fluid consistency: Nectar Thick TPN (CLINIMIX-E) Adult  Skin:  Reviewed, no issues  Last BM:  10/1  Height:   Ht Readings from Last 1 Encounters:  04/06/16 5' 10" (1.778 m)    Weight:   Wt Readings from Last 1 Encounters:  04/13/16 108 lb (49 kg)    Ideal Body Weight:  75.45 kg  BMI:  Body mass index is 15.5 kg/m.  Estimated Nutritional Needs:   Kcal:  1400-1650 calories  Protein:  56-70 gm  Fluid:  >/= 1.4L  EDUCATION NEEDS:   No education needs  identified at this time   M. , MS, RD LDN Inpatient Clinical Dietitian Pager [phone]  

## 2016-04-14 LAB — GLUCOSE, CAPILLARY
Glucose-Capillary: 127 mg/dL — ABNORMAL HIGH (ref 65–99)
Glucose-Capillary: 114 mg/dL — ABNORMAL HIGH (ref 65–99)

## 2016-04-15 ENCOUNTER — Other Ambulatory Visit: Payer: Self-pay

## 2016-04-15 DIAGNOSIS — C9002 Multiple myeloma in relapse: Secondary | ICD-10-CM

## 2016-04-15 LAB — VITAMIN B1: VITAMIN B1 (THIAMINE): 153.5 nmol/L (ref 66.5–200.0)

## 2016-04-18 ENCOUNTER — Ambulatory Visit
Admission: RE | Admit: 2016-04-18 | Discharge: 2016-04-18 | Disposition: A | Discharge status: Home / Self Care | Payer: Medicare Other | Source: Ambulatory Visit | Attending: Radiation Oncology | Admitting: Radiation Oncology

## 2016-04-18 DIAGNOSIS — Z510 Encounter for antineoplastic radiation therapy: Secondary | ICD-10-CM | POA: Diagnosis not present

## 2016-04-18 LAB — PREPARE RBC (CROSSMATCH)

## 2016-04-19 ENCOUNTER — Encounter: Payer: Self-pay | Admitting: Internal Medicine

## 2016-04-19 ENCOUNTER — Inpatient Hospital Stay (HOSPITAL_BASED_OUTPATIENT_CLINIC_OR_DEPARTMENT_OTHER): Payer: Medicare Other | Admitting: Internal Medicine

## 2016-04-19 ENCOUNTER — Ambulatory Visit: Payer: Medicare Other

## 2016-04-19 ENCOUNTER — Inpatient Hospital Stay: Payer: Medicare Other | Attending: Internal Medicine

## 2016-04-19 ENCOUNTER — Inpatient Hospital Stay: Payer: Medicare Other

## 2016-04-19 VITALS — BP 107/67 | HR 86 | Temp 98.7°F | Ht 70.0 in | Wt 94.8 lb

## 2016-04-19 DIAGNOSIS — R140 Abdominal distension (gaseous): Secondary | ICD-10-CM | POA: Insufficient documentation

## 2016-04-19 DIAGNOSIS — R918 Other nonspecific abnormal finding of lung field: Secondary | ICD-10-CM | POA: Diagnosis not present

## 2016-04-19 DIAGNOSIS — Z5111 Encounter for antineoplastic chemotherapy: Secondary | ICD-10-CM | POA: Diagnosis not present

## 2016-04-19 DIAGNOSIS — Z79899 Other long term (current) drug therapy: Secondary | ICD-10-CM

## 2016-04-19 DIAGNOSIS — R931 Abnormal findings on diagnostic imaging of heart and coronary circulation: Secondary | ICD-10-CM

## 2016-04-19 DIAGNOSIS — Z8701 Personal history of pneumonia (recurrent): Secondary | ICD-10-CM | POA: Insufficient documentation

## 2016-04-19 DIAGNOSIS — D649 Anemia, unspecified: Secondary | ICD-10-CM | POA: Diagnosis not present

## 2016-04-19 DIAGNOSIS — I2510 Atherosclerotic heart disease of native coronary artery without angina pectoris: Secondary | ICD-10-CM

## 2016-04-19 DIAGNOSIS — R630 Anorexia: Secondary | ICD-10-CM | POA: Insufficient documentation

## 2016-04-19 DIAGNOSIS — C9002 Multiple myeloma in relapse: Secondary | ICD-10-CM | POA: Insufficient documentation

## 2016-04-19 DIAGNOSIS — Z931 Gastrostomy status: Secondary | ICD-10-CM

## 2016-04-19 DIAGNOSIS — B370 Candidal stomatitis: Secondary | ICD-10-CM | POA: Diagnosis not present

## 2016-04-19 DIAGNOSIS — G893 Neoplasm related pain (acute) (chronic): Secondary | ICD-10-CM | POA: Diagnosis not present

## 2016-04-19 DIAGNOSIS — Z452 Encounter for adjustment and management of vascular access device: Secondary | ICD-10-CM

## 2016-04-19 DIAGNOSIS — K228 Other specified diseases of esophagus: Secondary | ICD-10-CM | POA: Insufficient documentation

## 2016-04-19 DIAGNOSIS — K769 Liver disease, unspecified: Secondary | ICD-10-CM

## 2016-04-19 DIAGNOSIS — E860 Dehydration: Secondary | ICD-10-CM | POA: Insufficient documentation

## 2016-04-19 DIAGNOSIS — M8580 Other specified disorders of bone density and structure, unspecified site: Secondary | ICD-10-CM | POA: Diagnosis not present

## 2016-04-19 DIAGNOSIS — R112 Nausea with vomiting, unspecified: Secondary | ICD-10-CM | POA: Diagnosis not present

## 2016-04-19 DIAGNOSIS — R490 Dysphonia: Secondary | ICD-10-CM | POA: Insufficient documentation

## 2016-04-19 DIAGNOSIS — I517 Cardiomegaly: Secondary | ICD-10-CM

## 2016-04-19 DIAGNOSIS — K5900 Constipation, unspecified: Secondary | ICD-10-CM | POA: Insufficient documentation

## 2016-04-19 DIAGNOSIS — Z8613 Personal history of malaria: Secondary | ICD-10-CM | POA: Insufficient documentation

## 2016-04-19 DIAGNOSIS — K2960 Other gastritis without bleeding: Secondary | ICD-10-CM

## 2016-04-19 DIAGNOSIS — Z8611 Personal history of tuberculosis: Secondary | ICD-10-CM

## 2016-04-19 DIAGNOSIS — R1310 Dysphagia, unspecified: Secondary | ICD-10-CM | POA: Insufficient documentation

## 2016-04-19 DIAGNOSIS — K219 Gastro-esophageal reflux disease without esophagitis: Secondary | ICD-10-CM

## 2016-04-19 LAB — COMPREHENSIVE METABOLIC PANEL
ALBUMIN: 2.9 g/dL — AB (ref 3.5–5.0)
ALK PHOS: 114 U/L (ref 38–126)
ALT: 42 U/L (ref 17–63)
AST: 32 U/L (ref 15–41)
Anion gap: 5 (ref 5–15)
BILIRUBIN TOTAL: 0.6 mg/dL (ref 0.3–1.2)
BUN: 31 mg/dL — AB (ref 6–20)
CALCIUM: 8.1 mg/dL — AB (ref 8.9–10.3)
CO2: 22 mmol/L (ref 22–32)
Chloride: 102 mmol/L (ref 101–111)
Creatinine, Ser: 0.72 mg/dL (ref 0.61–1.24)
GFR calc Af Amer: >60 mL/min (ref >60)
GFR calc non Af Amer: >60 mL/min (ref >60)
GLUCOSE: 133 mg/dL — AB (ref 65–99)
Potassium: 4.4 mmol/L (ref 3.5–5.1)
SODIUM: 129 mmol/L — AB (ref 135–145)
TOTAL PROTEIN: 8.4 g/dL — AB (ref 6.5–8.1)

## 2016-04-19 LAB — CBC WITH DIFFERENTIAL/PLATELET
BASOS ABS: 0 10*3/uL (ref 0–0.1)
BASOS PCT: 0 %
Eosinophils Absolute: 0 10*3/uL (ref 0–0.7)
Eosinophils Relative: 1 %
HCT: 33.4 % — ABNORMAL LOW (ref 40.0–52.0)
HEMOGLOBIN: 11.2 g/dL — AB (ref 13.0–18.0)
Lymphocytes Relative: 14 %
Lymphs Abs: 0.7 10*3/uL — ABNORMAL LOW (ref 1.0–3.6)
MCH: 28.4 pg (ref 26.0–34.0)
MCHC: 33.5 g/dL (ref 32.0–36.0)
MCV: 84.9 fL (ref 80.0–100.0)
Monocytes Absolute: 0.3 10*3/uL (ref 0.2–1.0)
Monocytes Relative: 7 %
NEUTROS PCT: 78 %
Neutro Abs: 3.8 10*3/uL (ref 1.4–6.5)
Platelets: 205 10*3/uL (ref 150–440)
RBC: 3.93 MIL/uL — AB (ref 4.40–5.90)
RDW: 17.2 % — ABNORMAL HIGH (ref 11.5–14.5)
WBC: 4.8 10*3/uL (ref 3.8–10.6)

## 2016-04-19 LAB — MAGNESIUM: Magnesium: 2.2 mg/dL (ref 1.7–2.4)

## 2016-04-19 MED ORDER — HEPARIN SOD (PORK) LOCK FLUSH 100 UNIT/ML IV SOLN: 250.0000 [IU] | Freq: Once | INTRAVENOUS | Status: DC | PRN

## 2016-04-19 MED ORDER — SODIUM CHLORIDE 0.9 % IV SOLN
8.0000 mg | Freq: Once | INTRAVENOUS | Status: AC
  Administered 2016-04-19: 8 mg via INTRAVENOUS
  Filled 2016-04-19: qty 4

## 2016-04-19 MED ORDER — SODIUM CHLORIDE 0.9% FLUSH
10.0000 mL | INTRAVENOUS | Status: DC | PRN
  Administered 2016-04-19: 10 mL via INTRAVENOUS
  Filled 2016-04-19: qty 10

## 2016-04-19 MED ORDER — ESOMEPRAZOLE MAGNESIUM 40 MG PO CPDR: 40.0000 mg | DELAYED_RELEASE_CAPSULE | Freq: Two times a day (BID) | ORAL | 6 refills | Status: DC

## 2016-04-19 MED ORDER — HEPARIN SOD (PORK) LOCK FLUSH 100 UNIT/ML IV SOLN: 500.0000 [IU] | Freq: Once | INTRAVENOUS | Status: DC | PRN

## 2016-04-19 MED ORDER — BORTEZOMIB CHEMO SQ INJECTION 3.5 MG (2.5MG/ML)
1.3000 mg/m2 | Freq: Once | INTRAMUSCULAR | Status: AC
  Administered 2016-04-19: 2 mg via SUBCUTANEOUS
  Filled 2016-04-19: qty 0.8

## 2016-04-19 MED ORDER — SODIUM CHLORIDE 0.9 % IV SOLN: Freq: Once | INTRAVENOUS | Status: DC

## 2016-04-19 MED ORDER — SODIUM CHLORIDE 0.9% FLUSH
3.0000 mL | Freq: Once | INTRAVENOUS | Status: DC | PRN
  Filled 2016-04-19: qty 3

## 2016-04-19 MED ORDER — SODIUM CHLORIDE 0.9% FLUSH
10.0000 mL | INTRAVENOUS | Status: DC | PRN
  Filled 2016-04-19: qty 10

## 2016-04-19 MED ORDER — HEPARIN SOD (PORK) LOCK FLUSH 100 UNIT/ML IV SOLN
500.0000 [IU] | Freq: Once | INTRAVENOUS | Status: AC
  Administered 2016-04-19: 500 [IU] via INTRAVENOUS
  Filled 2016-04-19: qty 5

## 2016-04-19 MED ORDER — ONDANSETRON HCL 40 MG/20ML IJ SOLN: 8.0000 mg | Freq: Once | INTRAMUSCULAR | Status: DC

## 2016-04-19 MED ORDER — SODIUM CHLORIDE 0.9 % IV SOLN
8.0000 mg | Freq: Once | INTRAVENOUS | Status: DC
  Filled 2016-04-19: qty 4

## 2016-04-19 MED ORDER — ACYCLOVIR 400 MG PO TABS: 400.0000 mg | ORAL_TABLET | Freq: Every day | ORAL | 6 refills | Status: DC

## 2016-04-19 MED ORDER — ONDANSETRON HCL 40 MG/20ML IJ SOLN
8.0000 mg | Freq: Once | INTRAMUSCULAR | Status: DC
  Filled 2016-04-19: qty 4

## 2016-04-19 MED ORDER — ALTEPLASE 2 MG IJ SOLR
2.0000 mg | Freq: Once | INTRAMUSCULAR | Status: DC | PRN
  Filled 2016-04-19: qty 2

## 2016-04-19 NOTE — Progress Notes (Signed)
[place] NOTE  Patient Care Team: Alfonse Flavors, MD as PCP - General (Family Medicine)  Ms. Francisco [name]; [facility]  CHIEF COMPLAINTS/PURPOSE OF CONSULTATION:  Multiple lytic lesions  #  Oncology History   # Multiple Myeloma lytic lesions- Thoracic spine/ vertebral compression fractures; IgG Kappa- 3.1gm/dl; K/L-140.   # Hoarseness of voice?  # Abnormal CXR;? 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  male  With New diagnosis of multiple myeloma IgG kappa- multiple bone lesions/anemia is here to proceed with cycle #2 day 1 of Velcade Today.  In the interim patient had been admitted to the hospital for poor by mouth intake intractable nausea vomiting-diagnosed with gastritis. Patient is currently on TPN.  He continues to have poor by mouth intake; he declines oral medication.  He has been taking 1 hydrocodone per day. He has not filled his fentanyl patch yet. Continues to complain of "gas" in the abdomen. Denies any tingling and numbness. Complains of fatigue.  Continues to have hoarseness of voice. Complains of constipation; is taking milk of magnesia.  ROS: A complete 10 point review of system is done which is negative except mentioned above in history of present illness  MEDICAL HISTORY:  Past Medical History:  Diagnosis Date  . Anemia   . Cancer (HCC)    Bone metastasis  . Constipation   . Hearing loss   . Hoarse voice quality   . Liver lesion   . Malaria 2003  . Multiple myeloma ([place]) 03/22/2016   Per Dr. Rogue Bussing on his PET order.  . Tuberculosis 1985    SURGICAL HISTORY: Past Surgical History:  Procedure Laterality Date  . 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 No smoking or alcohol.  Social History   Social History  . Marital status: Married     Spouse name: N/A  . Number of children: N/A  . Years of education: N/A   Occupational History  . Not on file.   Social History Main Topics  . Smoking status: Never Smoker  . Smokeless tobacco: Never Used  . Alcohol use No  . Drug use: No  . Sexual activity: Not on file   Other Topics Concern  . Not on file   Social History Narrative  . No narrative on file    FAMILY HISTORY: no history of cancers in the family. History reviewed. No pertinent family history.  ALLERGIES:  has No Known Allergies.  MEDICATIONS:  Current Outpatient Prescriptions  Medication Sig Dispense Refill  . HYDROcodone-acetaminophen (NORCO/VICODIN) 5-325 MG tablet Take 1 tablet by mouth every 4 (four) hours as needed for moderate pain. 60 tablet 0  . magnesium hydroxide (MILK OF MAGNESIA) 400 MG/5ML suspension Take 5 mLs by mouth daily as needed for mild constipation.    . ondansetron (ZOFRAN) 8 MG tablet Take 1 tablet (8 mg total) by mouth every 8 (eight) hours as needed for nausea or vomiting. 30 tablet 3  . acyclovir (ZOVIRAX) 400 MG tablet Take 1 tablet (400 mg total) by mouth daily. 30 tablet 6  . alum & mag hydroxide-simeth (MAALOX/MYLANTA) 200-200-20 MG/5ML suspension Take 30 mLs by mouth every 4 (four) hours as needed for indigestion or heartburn. (Patient not taking: Reported on 04/19/2016) 355 mL 0  . cholecalciferol (VITAMIN D) 1000 units tablet Take 1,000 Units by  mouth daily.    Marland Kitchen dronabinol (MARINOL) 2.5 MG capsule Take 1 capsule (2.5 mg total) by mouth 2 (two) times daily before lunch and supper. (Patient not taking: Reported on 04/19/2016) 30 capsule 0  . esomeprazole (NEXIUM) 40 MG capsule Take 1 capsule (40 mg total) by mouth 2 (two) times daily before a meal. 60 capsule 6  . fentaNYL (DURAGESIC - DOSED MCG/HR) 12 MCG/HR Place 1 patch (12.5 mcg total) onto the skin every 3 (three) days. (Patient not taking: Reported on 04/19/2016) 7 patch 0  . lactulose (CHRONULAC) 10 GM/15ML solution Take 30  mLs (20 g total) by mouth 2 (two) times daily as needed for mild constipation, moderate constipation or severe constipation. (Patient not taking: Reported on 04/19/2016) 240 mL 0  . phosphorus (K-PHOS-NEUTRAL) 155-852-130 MG tablet Take 1 tablet (250 mg total) by mouth 2 (two) times daily. (Patient not taking: Reported on 04/19/2016) 60 tablet 0  . prochlorperazine (COMPAZINE) 10 MG tablet Take 1 tablet (10 mg total) by mouth every 6 (six) hours as needed for nausea or vomiting. (Patient not taking: Reported on 04/19/2016) 30 tablet 3  . sertraline (ZOLOFT) 25 MG tablet Take 1 tablet (25 mg total) by mouth daily. (Patient not taking: Reported on 04/19/2016) 30 tablet 0  . sucralfate (CARAFATE) 1 g tablet Take 1 tablet (1 g total) by mouth 4 (four) times daily -  with meals and at bedtime. (Patient not taking: Reported on 04/19/2016) 30 tablet 1  . thiamine (VITAMIN B-1) 100 MG tablet Take 1 tablet (100 mg total) by mouth daily. (Patient not taking: Reported on 04/19/2016) 30 tablet 0  . vitamin B-12 (CYANOCOBALAMIN) 1000 MCG tablet Take 1,000 mcg by mouth daily.    . Vitamin D, Ergocalciferol, (DRISDOL) 50000 units CAPS capsule Take 50,000 Units by mouth every 7 (seven) days.     Current Facility-Administered Medications  Medication Dose Route Frequency Provider Last Rate Last Dose  . ondansetron (ZOFRAN) 8 mg in sodium chloride 0.9 % 50 mL IVPB  8 mg Intravenous Once Cammie Sickle, MD      . ondansetron (ZOFRAN) 8 mg in sodium chloride 0.9 % 50 mL IVPB  8 mg Intravenous Once Cammie Sickle, MD          .  PHYSICAL EXAMINATION: ECOG PERFORMANCE STATUS: 1 - Symptomatic but completely ambulatory  Vitals:   04/19/16 0948  BP: 107/67  Pulse: 86  Temp: 98.7 F (37.1 C)   Filed Weights   04/19/16 0948  Weight: 94 lb 12.8 oz (43 kg)    GENERAL: Thin built Cachectic-appearing male patient; Alert, no distress and comfortable.   With family. In a wheelchair. EYES: no pallor or  icterus OROPHARYNX: no thrush or ulceration; good dentition  NECK: supple, no masses felt LYMPH:  no palpable lymphadenopathy in the cervical, axillary or inguinal regions LUNGS: clear to auscultation and  No wheeze or crackles HEART/CVS: regular rate & rhythm and no murmurs; No lower extremity edema ABDOMEN: abdomen soft, non-tender and normal bowel sounds Musculoskeletal:no cyanosis of digits and no clubbing  PSYCH: alert & oriented x 3 with fluent speech NEURO: no focal motor/sensory deficits SKIN:  no rashes or significant lesions  LABORATORY DATA:  I have reviewed the data as listed Lab Results  Component Value Date   WBC 4.8 04/19/2016   HGB 11.2 (L) 04/19/2016   HCT 33.4 (L) 04/19/2016   MCV 84.9 04/19/2016   PLT 205 04/19/2016    Recent Labs  04/12/16 0820  04/13/16 0810 04/13/16 1838 04/19/16 0849  NA 139  --  138  --  129*  K 2.8*  < > 3.4* 3.6 4.4  CL 111  --  111  --  102  CO2 26  --  25  --  22  GLUCOSE 123*  --  110*  --  133*  BUN 13  --  14  --  31*  CREATININE 0.71  --  0.65  --  0.72  CALCIUM 6.6*  --  6.6*  --  8.1*  GFRNONAA >60  --  >60  --  >60  GFRAA >60  --  >60  --  >60  PROT 7.0  --  6.8  --  8.4*  ALBUMIN 2.6*  --  2.4*  --  2.9*  AST 59*  --  57*  --  32  ALT 50  --  57  --  42  ALKPHOS 76  --  72  --  114  BILITOT 0.6  --  0.3  --  0.6  < > = values in this interval not displayed.  RADIOGRAPHIC STUDIES: I have personally reviewed the radiological images as listed and agreed with the findings in the report. Nm Pet Image Initial (pi) Whole Body  Result Date: 03/29/2016 CLINICAL DATA:  Initial treatment strategy for multiple myeloma. PET-CT was attempted on 03/22/2016, however the PET portion of the study could not be completed due to patient discomfort. Status post radiation therapy and chemotherapy 1 day prior with steroids. EXAM: NUCLEAR MEDICINE PET WHOLE BODY TECHNIQUE: 12.8 mCi F-18 FDG was injected intravenously. Full-ring PET  imaging was performed from the vertex to the feet after the radiotracer. CT data was obtained and used for attenuation correction and anatomic localization. FASTING BLOOD GLUCOSE:  Value:  119 mg/dl COMPARISON:  03/22/2016 incomplete PET-CT and 03/15/2016 chest CT. FINDINGS: Head/Neck: No hypermetabolic lymph nodes in the neck. Chest: No hypermetabolic axillary, mediastinal or hilar nodes. Top-normal heart size. Atherosclerotic nonaneurysmal thoracic aorta. No pleural effusions. There are 4 mm pulmonary nodules in the anterior right upper lobe (series 3/image 96) and right middle lobe (series 3/image 147), below PET resolution. No acute consolidative airspace disease. Abdomen/Pelvis: No abnormal hypermetabolic activity within the liver, pancreas, adrenal glands, or spleen. No hypermetabolic lymph nodes in the abdomen or pelvis. There are 2 simple right liver cysts, largest 1.4 cm. Atherosclerotic nonaneurysmal abdominal aorta. Skeleton: There are numerous lytic bone lesions with associated mild hypermetabolism throughout the spine, ribs and pelvis, several of which are expansile liver extraosseous soft tissue components. For example an expansile 5.1 x 3.1 cm posterior left ninth rib lytic bone lesion with max SUV 3.1 (series 3/ image 130), a right T10 expansile 4.3 x 3.3 cm lytic bone lesion with max SUV 2.9 and associated pathologic T10 compression fracture, and a right upper sacral 2.7 x 2.0 cm mildly expansile lytic bone lesion (series 3/ image 217) with adjacent pathologic vertical right sacral ala fracture with max SUV 3.0. Extremities: No hypermetabolic activity to suggest metastasis. FDG uptake in the left antecubital fossa/left forearm is injection related. IMPRESSION: 1. Mildly hypermetabolic lytic bone lesions throughout the axial skeleton, several of which are expansile with extra-osseous soft tissue components, consistent with multiple myeloma. Pathologic fractures in the T10 vertebral body and right  sacral ala. 2. Two 4 mm right pulmonary nodules, below PET resolution. A follow-up unenhanced chest CT could be obtained in 3-6 months. 3. Aortic atherosclerosis. Electronically Signed   By: Ilona Sorrel  M.D.   On: 03/29/2016 10:25   Nm Pet Image Initial (pi) Whole Body  Result Date: 03/22/2016 CLINICAL DATA:  Subsequent treatment strategy for multiple myeloma EXAM: NUCLEAR MEDICINE PET WHOLE BODY TECHNIQUE: 14.2 mCi F-18 FDG was injected intravenously. CT data was obtained and used for attenuation correction and anatomic localization only. (This was not acquired as a diagnostic CT examination.) Additional exam technical data entered on technologist worksheet. FASTING BLOOD GLUCOSE:  Value: 105 mg/dl COMPARISON:  Chest CT 03/15/2016. FINDINGS: Comment: Per report from the technologist, the patient was in considerable pain during the examination. Despite efforts to maintain an acceptable level of comfort for the patient, the patient was unable to tolerate the entire examination. CT images were obtained, but PET images were discontinued after acquisition through only the head. PET images through the neck, chest, abdomen, pelvis and lower extremities were not obtained. Accordingly, the examination will be interpreted as a noncontrast CT of the head, neck, chest, abdomen, pelvis, skeleton and extremities. Head/Neck: No definite intracranial mass. No hydrocephalus. No aggressive appearing osseous lesions are noted in the skull. No definite cervical lymphadenopathy noted on the noncontrast CT images. Chest: Heart size is normal. There is no significant pericardial fluid, thickening or pericardial calcification. Aortic atherosclerosis, without definite aneurysm. No definite lymphadenopathy noted in the mediastinal or hilar nodal stations. Please note that accurate exclusion of hilar adenopathy is limited on noncontrast CT scans. Esophagus is unremarkable in appearance. No axillary lymphadenopathy. Mass effect upon the  left lower lobe (discussed below) related to expansile left ninth rib lesion. No suspicious appearing pulmonary nodules or masses. Mild bilateral apical nodular pleuroparenchymal thickening, most compatible with chronic post infectious or inflammatory scarring. No acute consolidative airspace disease. No pleural effusions. Abdomen/Pelvis: Several low-attenuation lesions are again noted in the liver, largest of which is in the central aspect of segment 8 measuring 1.4 x 0.9 cm. These are incompletely characterized on today's noncontrast CT examination, but are favored to represent tiny cysts. Gallbladder is poorly visualized and may be decompressed or surgically absent (no surgical clips are noted in the gallbladder fossa). Unenhanced appearance of the pancreas, spleen, bilateral adrenal glands and bilateral kidneys is unremarkable. No hydroureteronephrosis. Unenhanced appearance of the urinary bladder is normal. No pathologic dilatation of small bowel or colon. Skeleton/Extremities: Again noted are expansile lytic lesions involving the left ninth rib and T10 vertebral body where there is a previously recognized pathologic compression fracture. The lesion of the left ninth rib currently measures approximately 3.2 x 6.1 cm (image 130 of series 3) which is similar to the recent chest CT. Lesion in the right side of the T10 vertebral body extends into the adjacent paraspinal soft tissues and currently measures 3.4 x 3.5 cm, also similar to the prior examination. Large lytic lesion in the anterior aspect of the L4 vertebral body measuring 3.2 x 3.1 cm. Large lytic lesion in the right side of the sacrum just medial to the sacroiliac joint measuring 1.9 x 2.3 cm. Lytic lesion in the right ilium measuring 2.9 x 1.4 cm. Several other smaller lytic lesions are also noted. IMPRESSION: 1. Widespread lytic lesions throughout the visualized axial and appendicular skeleton, compatible with the reported clinical history of multiple  myeloma. This is most notable for the lytic lesion with pathologic compression fracture in the T10 vertebral body. No definite extra-skeletal involvement noted on today's examination, which was limited by lack of acquisition of diagnostic PET images secondary to patient pain during the examination. 2. Aortic atherosclerosis. 3. Additional  incidental findings, as above. Electronically Signed   By: Vinnie Langton M.D.   On: 03/22/2016 14:41   Ct Biopsy  Result Date: 03/25/2016 INDICATION: Multiple bony lytic lesions.  Possible multiple myeloma. EXAM: CT BIOPSY MEDICATIONS: None. ANESTHESIA/SEDATION: Moderate (conscious) sedation was employed during this procedure. A total of Versed 1.0 mg and Fentanyl 50 mcg was administered intravenously. Moderate Sedation Time: 18 minutes. The patient's level of consciousness and vital signs were monitored continuously by radiology nursing throughout the procedure under my direct supervision. COMPLICATIONS: None immediate. PROCEDURE: Informed written consent was obtained from the patient after a thorough discussion of the procedural risks, benefits and alternatives. All questions were addressed. Maximal Sterile Barrier Technique was utilized including mask, sterile gloves, sterile drape, hand hygiene and skin antiseptic. A timeout was performed prior to the initiation of the procedure. The patient was placed prone on the CT scanner. Posterior flank region was prepped and draped using sterile technique. Local anesthetic was applied. Under CT guidance, 22 gauge spinal needle is directed toward the posterior margin of the right iliac bone. Lidocaine was injected subperiosteally. Needle was removed. Then, also under CT guidance, trocar was directed through posterior cortex of right iliac bone. Bone marrow aspirations were obtained with and without heparin, and given to the pathology technologist who was present at that time. Then, bone marrow core biopsy was obtained, and this  sample was also given to the pathology technologist. Needle was removed and appropriate dressing was applied. IMPRESSION: Under CT guidance, percutaneous bone marrow aspiration and core biopsy of right iliac bone was performed. Electronically Signed   By: Marijo Conception, M.D.   On: 03/25/2016 10:14   Dg Chest Port 1 View  Result Date: 04/11/2016 CLINICAL DATA:  Status post PICC placement. EXAM: PORTABLE CHEST 1 VIEW COMPARISON:  04/06/2016. FINDINGS: Mildly enlarged cardiac silhouette with an interval mild decrease in size. Right PICC tip in the region of the upper right atrium. Small amount of linear density in the left lower lobe. Previously noted T 90 degrees 10 vertebral compression fracture. Possible additional vertebral compression fractures, difficult to assess in the frontal projection. Diffuse osteopenia. Mild scoliosis. IMPRESSION: 1. Right PICC tip in the upper right atrium. This could be retracted approximately 6 mm to place it at the superior cavoatrial junction. 2. Small amount of linear atelectasis and probable scarring in the left lower lobe. 3. Mild cardiomegaly with improvement. 4. T10 and possible additional thoracic vertebral compression deformities. Electronically Signed   By: Claudie Revering M.D.   On: 04/11/2016 15:49   Dg Chest Portable 1 View  Result Date: 04/06/2016 CLINICAL DATA:  Cough, dyspnea. History of multiple myeloma, T10 pathologic fracture. EXAM: PORTABLE CHEST 1 VIEW COMPARISON:  PET-CT March 29, 2016 FINDINGS: Cardiac silhouette is moderately enlarged, mediastinal silhouette is nonsuspicious, mildly calcified aortic knob. Increased lung volumes. No pleural effusion or focal consolidation. No pneumothorax. Apical pleural thickening. Osteopenia. Cachexia. Bold severe T10 compression fracture. IMPRESSION: Moderate cardiomegaly.  Increased lung volumes. Osteopenia with severe T10 fracture corresponding known pathologic fracture. Electronically Signed   By: Elon Alas M.D.   On: 04/06/2016 03:07   Dg Chest Port 1v Same Day  Result Date: 04/11/2016 CLINICAL DATA:  PICC line adjustment EXAM: PORTABLE CHEST 1 VIEW COMPARISON:  Portable exam 1636 hours compared to 1527 hours FINDINGS: Tip of RIGHT arm PICC line now projects over SVC above cavoatrial junction. Enlargement of cardiac silhouette. Atherosclerotic calcification aorta. Pulmonary vascularity normal. Lungs appear unchanged, with atelectasis versus scarring  at LEFT base and question underlying emphysematous changes. No pleural effusion or pneumothorax. Bones demineralized. IMPRESSION: Tip of RIGHT arm PICC line now projects over SVC. Enlargement of cardiac silhouette with suspected emphysematous changes and LEFT lower lobe atelectasis versus scarring. Electronically Signed   By: Lavonia Dana M.D.   On: 04/11/2016 16:54    ASSESSMENT & PLAN:   Multiple myeloma in relapse ([place]) Multiple myeloma M protein- 3.1 g/dL; kappa/lambda ratio 140; multiple bone lesions. Patient currently status post first cycle of Velcade; had to hold cycle #1 day 11 course of hospitalization.  # Proceed with Velcade cycle #2 day-1 today. Also start Revlimid- 6m q day x 2 weeks.   # Pain control-improved currently on hydrocodone twice a day;  also radiation. Add fentanyl today.   # Gastritis from steroids; discontinue steroids. Recommend Nexium PPI twice a day. Continue TPN  # Hypocalcemia- hold zometa;   # Anemia hemoglobin 11 improving.  # Follow-up with me on the in 1 week/Velcade. Weekly labs/check myeloma labs at that time.  All questions were answered. The patient knows to call the clinic with any problems, questions or concerns.    GCammie Sickle MD 04/19/2016 4:51 PM

## 2016-04-19 NOTE — Progress Notes (Signed)
Pts family reports that he wanted to eat but had a bubbly feeling abdomen area.  Family reports that abdomen is tight.  Recently in [place].

## 2016-04-19 NOTE — Assessment & Plan Note (Addendum)
Multiple myeloma M protein- 3.1 g/dL; kappa/lambda ratio 140; multiple bone lesions. Patient currently status post first cycle of Velcade; had to hold cycle #1 day 11 course of hospitalization.  # Proceed with Velcade cycle #2 day-1 today. Also start Revlimid- 47m q day x 2 weeks.   # Pain control-improved currently on hydrocodone twice a day;  also radiation. Add fentanyl today.   # Gastritis from steroids; discontinue steroids. Recommend Nexium PPI twice a day. Continue TPN  # Hypocalcemia- hold zometa;   # Anemia hemoglobin 11 improving.  # Follow-up with me on the in 1 week/Velcade. Weekly labs/check myeloma labs at that time.

## 2016-04-20 ENCOUNTER — Ambulatory Visit
Admission: RE | Admit: 2016-04-20 | Discharge: 2016-04-20 | Disposition: A | Discharge status: Home / Self Care | Payer: Medicare Other | Source: Ambulatory Visit | Attending: Radiation Oncology | Admitting: Radiation Oncology

## 2016-04-20 DIAGNOSIS — Z510 Encounter for antineoplastic radiation therapy: Secondary | ICD-10-CM | POA: Diagnosis not present

## 2016-04-21 ENCOUNTER — Inpatient Hospital Stay: Payer: Medicare Other | Admitting: Internal Medicine

## 2016-04-21 ENCOUNTER — Ambulatory Visit
Admission: RE | Admit: 2016-04-21 | Discharge: 2016-04-21 | Disposition: A | Discharge status: Home / Self Care | Payer: Medicare Other | Source: Ambulatory Visit | Attending: Radiation Oncology | Admitting: Radiation Oncology

## 2016-04-21 ENCOUNTER — Ambulatory Visit: Payer: Medicare Other

## 2016-04-21 DIAGNOSIS — Z510 Encounter for antineoplastic radiation therapy: Secondary | ICD-10-CM | POA: Diagnosis not present

## 2016-04-22 ENCOUNTER — Encounter: Payer: Self-pay | Admitting: Internal Medicine

## 2016-04-22 ENCOUNTER — Ambulatory Visit: Payer: Medicare Other

## 2016-04-22 ENCOUNTER — Other Ambulatory Visit: Payer: Self-pay | Admitting: *Deleted

## 2016-04-22 ENCOUNTER — Telehealth: Payer: Self-pay | Admitting: *Deleted

## 2016-04-22 ENCOUNTER — Inpatient Hospital Stay: Payer: Medicare Other

## 2016-04-22 ENCOUNTER — Encounter: Payer: Self-pay | Admitting: *Deleted

## 2016-04-22 ENCOUNTER — Other Ambulatory Visit: Payer: Self-pay | Admitting: Internal Medicine

## 2016-04-22 VITALS — BP 122/87 | HR 88 | Temp 98.2°F | Resp 20

## 2016-04-22 DIAGNOSIS — C9002 Multiple myeloma in relapse: Secondary | ICD-10-CM

## 2016-04-22 DIAGNOSIS — T451X5A Adverse effect of antineoplastic and immunosuppressive drugs, initial encounter: Principal | ICD-10-CM

## 2016-04-22 DIAGNOSIS — R112 Nausea with vomiting, unspecified: Secondary | ICD-10-CM

## 2016-04-22 DIAGNOSIS — Z5111 Encounter for antineoplastic chemotherapy: Secondary | ICD-10-CM | POA: Diagnosis not present

## 2016-04-22 MED ORDER — ONDANSETRON 8 MG PO TBDP
8.0000 mg | ORAL_TABLET | Freq: Once | ORAL | Status: AC
  Administered 2016-04-22: 8 mg via ORAL
  Filled 2016-04-22: qty 1

## 2016-04-22 MED ORDER — BORTEZOMIB CHEMO SQ INJECTION 3.5 MG (2.5MG/ML)
1.3000 mg/m2 | Freq: Once | INTRAMUSCULAR | Status: AC
  Administered 2016-04-22: 2 mg via SUBCUTANEOUS
  Filled 2016-04-22: qty 2

## 2016-04-22 MED ORDER — ONDANSETRON 8 MG PO TBDP: 8.0000 mg | ORAL_TABLET | Freq: Three times a day (TID) | ORAL | 3 refills | Status: DC | PRN

## 2016-04-22 MED ORDER — ONDANSETRON 8 MG PO TBDP: 8.0000 mg | ORAL_TABLET | Freq: Three times a day (TID) | ORAL | Status: DC | PRN

## 2016-04-22 MED ORDER — SODIUM CHLORIDE 0.9 % IV SOLN
Freq: Once | INTRAVENOUS | Status: DC
  Filled 2016-04-22: qty 1000

## 2016-04-22 MED ORDER — SODIUM CHLORIDE 0.9 % IV SOLN: Freq: Once | INTRAVENOUS | Status: DC

## 2016-04-22 NOTE — Telephone Encounter (Signed)
md- approved zofran odt. Written rx will be given to son on Monday.

## 2016-04-22 NOTE — Telephone Encounter (Signed)
-----   Message from Leona Singleton, RN sent at 04/22/2016 11:03 AM EDT ----- Patient son is requesting Zofran ODT Rx to be called into pharmacy

## 2016-04-22 NOTE — Progress Notes (Signed)
Medication refill submitted to biologics via. Rems survey submitted.

## 2016-04-25 ENCOUNTER — Encounter: Payer: Self-pay | Admitting: Emergency Medicine

## 2016-04-25 ENCOUNTER — Ambulatory Visit
Admission: RE | Admit: 2016-04-25 | Discharge: 2016-04-25 | Disposition: A | Discharge status: Home / Self Care | Payer: Medicare Other | Source: Ambulatory Visit | Attending: Radiation Oncology | Admitting: Radiation Oncology

## 2016-04-25 ENCOUNTER — Inpatient Hospital Stay
Admission: EM | Admit: 2016-04-25 | Discharge: 2016-05-04 | DRG: 871 | Disposition: A | Discharge status: Home Health | Payer: Medicare Other | Attending: Internal Medicine | Admitting: Internal Medicine

## 2016-04-25 ENCOUNTER — Emergency Department: Payer: Medicare Other

## 2016-04-25 ENCOUNTER — Other Ambulatory Visit: Payer: Self-pay

## 2016-04-25 DIAGNOSIS — J189 Pneumonia, unspecified organism: Secondary | ICD-10-CM

## 2016-04-25 DIAGNOSIS — K228 Other specified diseases of esophagus: Secondary | ICD-10-CM | POA: Diagnosis present

## 2016-04-25 DIAGNOSIS — L89102 Pressure ulcer of unspecified part of back, stage 2: Secondary | ICD-10-CM | POA: Diagnosis present

## 2016-04-25 DIAGNOSIS — Z681 Body mass index (BMI) 19 or less, adult: Secondary | ICD-10-CM | POA: Diagnosis not present

## 2016-04-25 DIAGNOSIS — Z510 Encounter for antineoplastic radiation therapy: Secondary | ICD-10-CM | POA: Diagnosis not present

## 2016-04-25 DIAGNOSIS — R195 Other fecal abnormalities: Secondary | ICD-10-CM

## 2016-04-25 DIAGNOSIS — C9000 Multiple myeloma not having achieved remission: Secondary | ICD-10-CM | POA: Diagnosis present

## 2016-04-25 DIAGNOSIS — G9341 Metabolic encephalopathy: Secondary | ICD-10-CM | POA: Diagnosis present

## 2016-04-25 DIAGNOSIS — R0602 Shortness of breath: Secondary | ICD-10-CM

## 2016-04-25 DIAGNOSIS — R740 Nonspecific elevation of levels of transaminase and lactic acid dehydrogenase [LDH]: Secondary | ICD-10-CM | POA: Diagnosis present

## 2016-04-25 DIAGNOSIS — R4182 Altered mental status, unspecified: Secondary | ICD-10-CM | POA: Diagnosis present

## 2016-04-25 DIAGNOSIS — R112 Nausea with vomiting, unspecified: Secondary | ICD-10-CM

## 2016-04-25 DIAGNOSIS — A419 Sepsis, unspecified organism: Principal | ICD-10-CM | POA: Diagnosis present

## 2016-04-25 DIAGNOSIS — D510 Vitamin B12 deficiency anemia due to intrinsic factor deficiency: Secondary | ICD-10-CM | POA: Diagnosis present

## 2016-04-25 DIAGNOSIS — D696 Thrombocytopenia, unspecified: Secondary | ICD-10-CM | POA: Diagnosis present

## 2016-04-25 DIAGNOSIS — E871 Hypo-osmolality and hyponatremia: Secondary | ICD-10-CM | POA: Diagnosis present

## 2016-04-25 DIAGNOSIS — R05 Cough: Secondary | ICD-10-CM

## 2016-04-25 DIAGNOSIS — Y95 Nosocomial condition: Secondary | ICD-10-CM | POA: Diagnosis present

## 2016-04-25 DIAGNOSIS — Z79899 Other long term (current) drug therapy: Secondary | ICD-10-CM | POA: Diagnosis not present

## 2016-04-25 DIAGNOSIS — Z8611 Personal history of tuberculosis: Secondary | ICD-10-CM | POA: Diagnosis not present

## 2016-04-25 DIAGNOSIS — M791 Myalgia: Secondary | ICD-10-CM | POA: Diagnosis present

## 2016-04-25 DIAGNOSIS — R066 Hiccough: Secondary | ICD-10-CM | POA: Diagnosis not present

## 2016-04-25 DIAGNOSIS — R262 Difficulty in walking, not elsewhere classified: Secondary | ICD-10-CM

## 2016-04-25 DIAGNOSIS — C9002 Multiple myeloma in relapse: Secondary | ICD-10-CM | POA: Diagnosis present

## 2016-04-25 DIAGNOSIS — R6510 Systemic inflammatory response syndrome (SIRS) of non-infectious origin without acute organ dysfunction: Secondary | ICD-10-CM | POA: Diagnosis present

## 2016-04-25 DIAGNOSIS — R627 Adult failure to thrive: Secondary | ICD-10-CM | POA: Diagnosis present

## 2016-04-25 DIAGNOSIS — E43 Unspecified severe protein-calorie malnutrition: Secondary | ICD-10-CM | POA: Diagnosis present

## 2016-04-25 DIAGNOSIS — R059 Cough, unspecified: Secondary | ICD-10-CM

## 2016-04-25 DIAGNOSIS — D61818 Other pancytopenia: Secondary | ICD-10-CM | POA: Diagnosis present

## 2016-04-25 DIAGNOSIS — R0682 Tachypnea, not elsewhere classified: Secondary | ICD-10-CM | POA: Diagnosis present

## 2016-04-25 DIAGNOSIS — M6281 Muscle weakness (generalized): Secondary | ICD-10-CM

## 2016-04-25 DIAGNOSIS — R140 Abdominal distension (gaseous): Secondary | ICD-10-CM

## 2016-04-25 DIAGNOSIS — R7401 Elevation of levels of liver transaminase levels: Secondary | ICD-10-CM | POA: Diagnosis present

## 2016-04-25 DIAGNOSIS — E860 Dehydration: Secondary | ICD-10-CM | POA: Diagnosis not present

## 2016-04-25 DIAGNOSIS — L8990 Pressure ulcer of unspecified site, unspecified stage: Secondary | ICD-10-CM | POA: Insufficient documentation

## 2016-04-25 LAB — COMPREHENSIVE METABOLIC PANEL
ALBUMIN: 2 g/dL — AB (ref 3.5–5.0)
ALK PHOS: 136 U/L — AB (ref 38–126)
ALT: 65 U/L — AB (ref 17–63)
ANION GAP: 2 — AB (ref 5–15)
AST: 40 U/L (ref 15–41)
BILIRUBIN TOTAL: 0.9 mg/dL (ref 0.3–1.2)
BUN: 51 mg/dL — ABNORMAL HIGH (ref 6–20)
CALCIUM: 8.1 mg/dL — AB (ref 8.9–10.3)
CO2: 26 mmol/L (ref 22–32)
CREATININE: 0.72 mg/dL (ref 0.61–1.24)
Chloride: 116 mmol/L — ABNORMAL HIGH (ref 101–111)
GFR calc Af Amer: >60 mL/min (ref >60)
GFR calc non Af Amer: >60 mL/min (ref >60)
GLUCOSE: 89 mg/dL (ref 65–99)
Potassium: 4.3 mmol/L (ref 3.5–5.1)
Sodium: 144 mmol/L (ref 135–145)
TOTAL PROTEIN: 5.8 g/dL — AB (ref 6.5–8.1)

## 2016-04-25 LAB — CBC WITH DIFFERENTIAL/PLATELET
BAND NEUTROPHILS: 13 %
BASOS ABS: 0 10*3/uL (ref 0–0.1)
BASOS PCT: 0 %
BLASTS: 0 %
EOS ABS: 0 10*3/uL (ref 0–0.7)
Eosinophils Relative: 1 %
HCT: 30.3 % — ABNORMAL LOW (ref 40.0–52.0)
HEMOGLOBIN: 10.3 g/dL — AB (ref 13.0–18.0)
LYMPHS PCT: 5 %
Lymphs Abs: 0.1 10*3/uL — ABNORMAL LOW (ref 1.0–3.6)
MCH: 28.6 pg (ref 26.0–34.0)
MCHC: 33.9 g/dL (ref 32.0–36.0)
MCV: 84.6 fL (ref 80.0–100.0)
MONO ABS: 0.1 10*3/uL — AB (ref 0.2–1.0)
MYELOCYTES: 0 %
Metamyelocytes Relative: 2 %
Monocytes Relative: 4 %
Neutro Abs: 1.9 10*3/uL (ref 1.4–6.5)
Neutrophils Relative %: 75 %
OTHER: 0 %
PLATELETS: 38 10*3/uL — AB (ref 150–440)
PROMYELOCYTES ABS: 0 %
RBC: 3.58 MIL/uL — ABNORMAL LOW (ref 4.40–5.90)
RDW: 17.3 % — ABNORMAL HIGH (ref 11.5–14.5)
WBC: 2.1 10*3/uL — ABNORMAL LOW (ref 3.8–10.6)
nRBC: 10 /100 WBC — ABNORMAL HIGH

## 2016-04-25 LAB — PROTIME-INR
INR: 1.52
PROTHROMBIN TIME: 18.5 s — AB (ref 11.4–15.2)

## 2016-04-25 LAB — MAGNESIUM: Magnesium: 2.2 mg/dL (ref 1.7–2.4)

## 2016-04-25 LAB — PHOSPHORUS: PHOSPHORUS: 2.7 mg/dL (ref 2.5–4.6)

## 2016-04-25 LAB — FIBRINOGEN: Fibrinogen: 626 mg/dL — ABNORMAL HIGH (ref 210–475)

## 2016-04-25 LAB — LACTIC ACID, PLASMA: Lactic Acid, Venous: 1.3 mmol/L (ref 0.5–1.9)

## 2016-04-25 LAB — BRAIN NATRIURETIC PEPTIDE: B Natriuretic Peptide: 121 pg/mL — ABNORMAL HIGH (ref 0.0–100.0)

## 2016-04-25 MED ORDER — VANCOMYCIN HCL IN DEXTROSE 1-5 GM/200ML-% IV SOLN
1000.0000 mg | Freq: Once | INTRAVENOUS | Status: AC
  Administered 2016-04-25: 1000 mg via INTRAVENOUS
  Filled 2016-04-25: qty 200

## 2016-04-25 MED ORDER — CEFEPIME-DEXTROSE 2 GM/50ML IV SOLR
2.0000 g | Freq: Two times a day (BID) | INTRAVENOUS | Status: DC
  Administered 2016-04-25 – 2016-04-26 (×2): 2 g via INTRAVENOUS
  Filled 2016-04-25 (×4): qty 50

## 2016-04-25 MED ORDER — ORAL CARE MOUTH RINSE
15.0000 mL | Freq: Two times a day (BID) | OROMUCOSAL | Status: DC
  Administered 2016-04-25 – 2016-05-03 (×11): 15 mL via OROMUCOSAL

## 2016-04-25 MED ORDER — MAGNESIUM HYDROXIDE 400 MG/5ML PO SUSP
5.0000 mL | Freq: Every day | ORAL | Status: DC | PRN
Start: 2016-04-25 — End: 2016-05-04
  Filled 2016-04-25 (×2): qty 30

## 2016-04-25 MED ORDER — VITAMIN D 1000 UNITS PO TABS
1000.0000 [IU] | ORAL_TABLET | Freq: Every day | ORAL | Status: DC
  Administered 2016-04-25 – 2016-04-26 (×2): 1000 [IU] via ORAL
  Filled 2016-04-25 (×3): qty 1

## 2016-04-25 MED ORDER — VITAMIN B-12 1000 MCG PO TABS
1000.0000 ug | ORAL_TABLET | Freq: Every day | ORAL | Status: DC
  Administered 2016-04-25 – 2016-04-26 (×2): 1000 ug via ORAL
  Filled 2016-04-25 (×3): qty 1

## 2016-04-25 MED ORDER — SODIUM CHLORIDE 0.9 % IV SOLN
INTRAVENOUS | Status: DC
Start: 2016-04-25 — End: 2016-04-28
  Administered 2016-04-25 – 2016-04-28 (×3): via INTRAVENOUS

## 2016-04-25 MED ORDER — DOCUSATE SODIUM 100 MG PO CAPS
100.0000 mg | ORAL_CAPSULE | Freq: Two times a day (BID) | ORAL | Status: DC
  Administered 2016-04-25 – 2016-05-02 (×3): 100 mg via ORAL
  Filled 2016-04-25 (×11): qty 1

## 2016-04-25 MED ORDER — ACETAMINOPHEN 650 MG RE SUPP: 650.0000 mg | Freq: Four times a day (QID) | RECTAL | Status: DC | PRN

## 2016-04-25 MED ORDER — HYDROCODONE-ACETAMINOPHEN 5-325 MG PO TABS
1.0000 | ORAL_TABLET | ORAL | Status: DC | PRN
  Administered 2016-04-26 – 2016-04-28 (×7): 1 via ORAL
  Filled 2016-04-25 (×8): qty 1

## 2016-04-25 MED ORDER — CEFEPIME-DEXTROSE 1 GM/50ML IV SOLR
1.0000 g | Freq: Once | INTRAVENOUS | Status: AC
  Administered 2016-04-25: 1 g via INTRAVENOUS
  Filled 2016-04-25: qty 50

## 2016-04-25 MED ORDER — ONDANSETRON HCL 4 MG/2ML IJ SOLN
4.0000 mg | Freq: Four times a day (QID) | INTRAMUSCULAR | Status: DC | PRN
  Administered 2016-04-25 – 2016-05-01 (×2): 4 mg via INTRAVENOUS
  Filled 2016-04-25 (×2): qty 2

## 2016-04-25 MED ORDER — ACETAMINOPHEN 325 MG PO TABS: 650.0000 mg | ORAL_TABLET | Freq: Four times a day (QID) | ORAL | Status: DC | PRN

## 2016-04-25 MED ORDER — VITAMIN D (ERGOCALCIFEROL) 1.25 MG (50000 UNIT) PO CAPS
50000.0000 [IU] | ORAL_CAPSULE | ORAL | Status: DC
  Filled 2016-04-25: qty 1

## 2016-04-25 MED ORDER — SODIUM CHLORIDE 0.9% FLUSH
3.0000 mL | Freq: Two times a day (BID) | INTRAVENOUS | Status: DC
  Administered 2016-04-25 – 2016-05-04 (×15): 3 mL via INTRAVENOUS

## 2016-04-25 MED ORDER — ONDANSETRON HCL 4 MG PO TABS: 4.0000 mg | ORAL_TABLET | Freq: Four times a day (QID) | ORAL | Status: DC | PRN

## 2016-04-25 MED ORDER — PANTOPRAZOLE SODIUM 40 MG PO TBEC
40.0000 mg | DELAYED_RELEASE_TABLET | Freq: Two times a day (BID) | ORAL | Status: DC
  Administered 2016-04-25 – 2016-04-26 (×2): 40 mg via ORAL
  Filled 2016-04-25 (×5): qty 1

## 2016-04-25 MED ORDER — SODIUM CHLORIDE 0.9 % IV BOLUS (SEPSIS)
1000.0000 mL | Freq: Once | INTRAVENOUS | Status: AC
  Administered 2016-04-25: 1000 mL via INTRAVENOUS

## 2016-04-25 MED ORDER — ACYCLOVIR 200 MG PO CAPS
400.0000 mg | ORAL_CAPSULE | Freq: Every day | ORAL | Status: DC
  Administered 2016-04-25: 400 mg via ORAL
  Filled 2016-04-25 (×3): qty 2

## 2016-04-25 MED ORDER — SODIUM CHLORIDE 0.9 % IV SOLN
INTRAVENOUS | Status: DC
  Administered 2016-04-25: 22:00:00 via INTRAVENOUS

## 2016-04-25 MED ORDER — SODIUM CHLORIDE 0.9 % IV SOLN
10.0000 mL/h | Freq: Once | INTRAVENOUS | Status: AC
  Administered 2016-04-25: 10 mL/h via INTRAVENOUS

## 2016-04-25 MED ORDER — ALBUTEROL SULFATE (2.5 MG/3ML) 0.083% IN NEBU
2.5000 mg | INHALATION_SOLUTION | Freq: Four times a day (QID) | RESPIRATORY_TRACT | Status: DC
  Administered 2016-04-25 – 2016-04-26 (×4): 2.5 mg via RESPIRATORY_TRACT
  Filled 2016-04-25 (×5): qty 3

## 2016-04-25 MED ORDER — TIOTROPIUM BROMIDE MONOHYDRATE 18 MCG IN CAPS
18.0000 ug | ORAL_CAPSULE | Freq: Every day | RESPIRATORY_TRACT | Status: DC
  Administered 2016-04-26 – 2016-05-03 (×7): 18 ug via RESPIRATORY_TRACT
  Filled 2016-04-25 (×2): qty 5

## 2016-04-25 MED ORDER — PANTOPRAZOLE SODIUM 40 MG PO TBEC: 40.0000 mg | DELAYED_RELEASE_TABLET | Freq: Every day | ORAL | Status: DC

## 2016-04-25 MED ORDER — VANCOMYCIN HCL IN DEXTROSE 750-5 MG/150ML-% IV SOLN
750.0000 mg | INTRAVENOUS | Status: DC
  Administered 2016-04-26 – 2016-04-27 (×2): 750 mg via INTRAVENOUS
  Filled 2016-04-25 (×3): qty 150

## 2016-04-25 MED ORDER — GUAIFENESIN ER 600 MG PO TB12
600.0000 mg | ORAL_TABLET | Freq: Two times a day (BID) | ORAL | Status: DC
  Administered 2016-04-25 – 2016-04-26 (×3): 600 mg via ORAL
  Filled 2016-04-25 (×4): qty 1

## 2016-04-25 NOTE — Progress Notes (Signed)
Pharmacy Antibiotic Note  Francisco [name] is a 70 y.o. male admitted on 04/25/2016 with sepsis.  Pharmacy has been consulted for vancomycin and cefepime dosing.  Plan: Cefepime 2 g IV q12h  Vancomycin 1000 mg given in ED.  Order vancomycin 750 mg IV q24h beginning 1000 tomorrow (15 hour stacked dose) Goal vancomycin trough 15-20 mcg/mL Vancomycin trough ordered for 10/19 @ 0930  Kinetics: Dosed on actual body weight = 43 kg Ke: 0.047 Half-life: 15 hrs Vd: 30 L  Cmin = 15 mcg/mL  Temp (24hrs), Avg:98.2 F (36.8 C), Min:98.2 F (36.8 C), Max:98.2 F (36.8 C)   Recent Labs Lab 04/19/16 0849 04/25/16 1632  WBC 4.8 2.1*  CREATININE 0.72 0.72  LATICACIDVEN  --  1.3    Estimated Creatinine Clearance: 51.5 mL/min (by C-G formula based on SCr of 0.72 mg/dL).    No Known Allergies  Antimicrobials this admission: vancomycin 10/16 >>  cefepime 10/16 >>   Dose adjustments this admission:  Microbiology results: 10/16 BCx: Sent  Thank you for allowing pharmacy to be a part of this patient's care.  Lenis Noon, PharmD Clinical Pharmacist 04/25/2016 8:14 PM

## 2016-04-25 NOTE — H&P (Addendum)
[facility] [place] NAME: Francisco [name]    MR#:  [id number]  DATE OF BIRTH:  [birth year]  DATE OF ADMISSION:  04/25/2016  PRIMARY CARE PHYSICIAN: Alfonse Flavors, MD   REQUESTING/REFERRING PHYSICIAN:   CHIEF COMPLAINT:   Chief Complaint  Patient presents with  . Altered Mental Status    HISTORY OF PRESENT ILLNESS: Francisco [name]  is a 70 y.o. male with a known history of also radiation therapy, who presents to the hospital with complaints of altered mental status, somnolence, shortness of breath, shakes. According to patient's family, patient had chemotherapy last Thursday, about 3 days ago, he is been suffering with diarrhea for the past 2 days, not eating, just few spoons, today at 2 AM he developed shortness of breath, shakiness, I was very drowsy and was brought to emergency room for further evaluation and treatment. In the emergency room he was noted to have tachypnea, tachycardia, history is x-ray was concerning for pneumonia, labs revealed pancytopenia. Hospitalist services were contacted for admission.   PAST MEDICAL HISTORY:   Past Medical History:  Diagnosis Date  . Anemia   . Cancer (HCC)    Bone metastasis  . Constipation   . Hearing loss   . Hoarse voice quality   . Liver lesion   . Malaria 2003  . Multiple myeloma ([place]) 03/22/2016   Per Dr. Rogue Bussing on his PET order.  . Tuberculosis 1985    PAST SURGICAL HISTORY: Past Surgical History:  Procedure Laterality Date  . RETINAL DETACHMENT SURGERY  2006    SOCIAL HISTORY:  Social History  Substance Use Topics  . Smoking status: Never Smoker  . Smokeless tobacco: Never Used  . Alcohol use No    FAMILY HISTORY: No family history on file.  DRUG ALLERGIES: No Known Allergies  Review of Systems  Unable to perform ROS: Critical illness    MEDICATIONS AT HOME:  Prior to Admission medications   Medication Sig Start Date End Date Taking?  Authorizing Provider  acyclovir (ZOVIRAX) 400 MG tablet Take 1 tablet (400 mg total) by mouth daily. 04/19/16   Cammie Sickle, MD  alum & mag hydroxide-simeth (MAALOX/MYLANTA) 200-200-20 MG/5ML suspension Take 30 mLs by mouth every 4 (four) hours as needed for indigestion or heartburn. Patient not taking: Reported on 04/19/2016 04/13/16   [name], MD  cholecalciferol (VITAMIN D) 1000 units tablet Take 1,000 Units by mouth daily.    Historical Provider, MD  dronabinol (MARINOL) 2.5 MG capsule Take 1 capsule (2.5 mg total) by mouth 2 (two) times daily before lunch and supper. Patient not taking: Reported on 04/19/2016 04/13/16   [name], MD  esomeprazole (NEXIUM) 40 MG capsule Take 1 capsule (40 mg total) by mouth 2 (two) times daily before a meal. 04/19/16   Cammie Sickle, MD  fentaNYL (DURAGESIC - DOSED MCG/HR) 12 MCG/HR Place 1 patch (12.5 mcg total) onto the skin every 3 (three) days. Patient not taking: Reported on 04/19/2016 04/15/16   [name], MD  HYDROcodone-acetaminophen (NORCO/VICODIN) 5-325 MG tablet Take 1 tablet by mouth every 4 (four) hours as needed for moderate pain. 04/13/16   [name], MD  lactulose (CHRONULAC) 10 GM/15ML solution Take 30 mLs (20 g total) by mouth 2 (two) times daily as needed for mild constipation, moderate constipation or severe constipation. Patient not taking: Reported on 04/19/2016 04/13/16   [name], MD  magnesium hydroxide (MILK OF MAGNESIA) 400 MG/5ML suspension Take 5 mLs  by mouth daily as needed for mild constipation.    Historical Provider, MD  ondansetron (ZOFRAN ODT) 8 MG disintegrating tablet Take 1 tablet (8 mg total) by mouth every 8 (eight) hours as needed for nausea or vomiting. 04/22/16   Cammie Sickle, MD  ondansetron (ZOFRAN) 8 MG tablet Take 1 tablet (8 mg total) by mouth every 8 (eight) hours as needed for nausea or vomiting. 03/24/16   Cammie Sickle, MD  phosphorus  (K-PHOS-NEUTRAL) [phone] MG tablet Take 1 tablet (250 mg total) by mouth 2 (two) times daily. Patient not taking: Reported on 04/19/2016 04/13/16   [name], MD  prochlorperazine (COMPAZINE) 10 MG tablet Take 1 tablet (10 mg total) by mouth every 6 (six) hours as needed for nausea or vomiting. Patient not taking: Reported on 04/19/2016 03/24/16   Cammie Sickle, MD  sertraline (ZOLOFT) 25 MG tablet Take 1 tablet (25 mg total) by mouth daily. Patient not taking: Reported on 04/19/2016 04/13/16   [name], MD  sucralfate (CARAFATE) 1 g tablet Take 1 tablet (1 g total) by mouth 4 (four) times daily -  with meals and at bedtime. Patient not taking: Reported on 04/19/2016 04/13/16   [name], MD  thiamine (VITAMIN B-1) 100 MG tablet Take 1 tablet (100 mg total) by mouth daily. Patient not taking: Reported on 04/19/2016 04/13/16   [name], MD  vitamin B-12 (CYANOCOBALAMIN) 1000 MCG tablet Take 1,000 mcg by mouth daily.    Historical Provider, MD  Vitamin D, Ergocalciferol, (DRISDOL) 50000 units CAPS capsule Take 50,000 Units by mouth every 7 (seven) days.    Historical Provider, MD      PHYSICAL EXAMINATION:   VITAL SIGNS: Blood pressure 131/89, pulse (!) 115, temperature 98.2 F (36.8 C), temperature source Oral, resp. rate (!) 22, SpO2 99 %.  GENERAL:  70 y.o.-year-old patient lying in the bed with no acute distress. Very somnolent, barely able to open his eyes, nonverbal, does not follow commands EYES: Pupils equal, round, reactive to light and accommodation. No scleral icterus. Extraocular muscles intact.  HEENT: Head atraumatic, normocephalic. Oropharynx and nasopharynx clear.  NECK:  Supple, no jugular venous distention. No thyroid enlargement, no tenderness.  LUNGS: Normal breath sounds bilaterally, no wheezing, few scattered rales,rhonchi or crepitations noted anteriorly. Shallow inspirations, effort . No use of accessory muscles of  respiration.  CARDIOVASCULAR: S1, S2 normal. No murmurs, rubs, or gallops.  ABDOMEN: Soft, nontender, nondistended. Bowel sounds present. No organomegaly or mass. Hemoccult was performed in the emergency room was positive, although stool was brown, no black or tarry looking stool or blood, per emergency room physician EXTREMITIES: No pedal edema, cyanosis, or clubbing.  NEUROLOGIC: Cranial nerves II through XII are grossly intact. Muscle strength , unable to evaluate, but able to move all extremities. Sensation unable to evaluate. Gait not checked.  PSYCHIATRIC: The patient is very somnolent, briefly opens his eyes, injected James back to sleep, and does not follow commands, nonverbal, not able to assess orientation.  SKIN: No obvious rash, lesion, or ulcer.   LABORATORY PANEL:   CBC  Recent Labs Lab 04/19/16 0849 04/25/16 1632  WBC 4.8 2.1*  HGB 11.2* 10.3*  HCT 33.4* 30.3*  PLT 205 38*  MCV 84.9 84.6  MCH 28.4 28.6  MCHC 33.5 33.9  RDW 17.2* 17.3*  LYMPHSABS 0.7* 0.1*  MONOABS 0.3 0.1*  EOSABS 0.0 0.0  BASOSABS 0.0 0.0   ------------------------------------------------------------------------------------------------------------------  Chemistries   Recent Labs Lab 04/25/16 1632  NA 144  K 4.3  CL 116*  CO2 26  GLUCOSE 89  BUN 51*  CREATININE 0.72  CALCIUM 8.1*  MG 2.2  AST 40  ALT 65*  ALKPHOS 136*  BILITOT 0.9   ------------------------------------------------------------------------------------------------------------------  Cardiac Enzymes No results for input(s): TROPONINI in the last 168 hours. ------------------------------------------------------------------------------------------------------------------  RADIOLOGY: Dg Chest 2 View  Result Date: 04/25/2016 CLINICAL DATA:  Shortness of breath after chemo and radiation treatment earlier today. EXAM: CHEST  2 VIEW COMPARISON:  04/11/2016 FINDINGS: Two views study shows cardiomegaly. Interval  development of interstitial and central airspace disease, right greater than left. Right-sided central line tip projects at the mid SVC level. Telemetry leads overlie the chest. IMPRESSION: Cardiomegaly with interstitial and asymmetric airspace disease, right greater than left, new in the interval. Asymmetric pulmonary edema or diffuse infection would be considerations. Electronically Signed   By: Misty Stanley M.D.   On: 04/25/2016 17:24   Ct Head Wo Contrast  Result Date: 04/25/2016 CLINICAL DATA:  History of multiple myeloma and altered mental status EXAM: CT HEAD WITHOUT CONTRAST TECHNIQUE: Contiguous axial images were obtained from the base of the skull through the vertex without intravenous contrast. COMPARISON:  None FINDINGS: Brain: No evidence of acute infarction, hemorrhage, hydrocephalus, extra-axial collection or mass lesion/mass effect. Mild atrophic changes are noted. Vascular: No hyperdense vessel or unexpected calcification. Skull: Normal. Negative for fracture or focal lesion. Sinuses/Orbits: No acute finding. Other: None. IMPRESSION: Mild atrophic changes.  No acute abnormality is noted. Electronically Signed   By: Inez Catalina M.D.   On: 04/25/2016 18:22    EKG: Orders placed or performed in visit on 04/25/16  . EKG 12-Lead   EKG in the emergency room reveals sinus tachycardia at 115 bpm, normal axis, nonspecific T-wave abnormalities in anterior lateral leads IMPRESSION AND PLAN:  Active Problems:   Pancytopenia (HCC)   Metabolic encephalopathy   Healthcare-associated pneumonia   Elevated transaminase level   SIRS (systemic inflammatory response syndrome) ([place])  #1. Metabolic encephalopathy due to infection, and the patient, medical floor, continue neuro checks, supportive therapy. Speech therapist evaluated the patient, patient will be nothing by mouth due to altered mental status. IV fluids #2. Sepsis due to pneumonia, follow labs, continue broad-spectrum antibiotic  therapy, follow cultures #3. Healthcare associated pneumonia, initiate patient on cefepime and vancomycin, adjust antibiotics depending on culture results, get sputum cultures if possible #4. Elevated transaminase level, follow in the morning, get ultrasound of right upper quadrant. If transaminase level is abnormal #5. Pancytopenia, patient was recommended to be transfused platelets by oncologist, follow CBC in the morning, get oncologist involved in recommendations #6. Guaiac-positive stool, likely due to thrombocytopenia, radiation therapy, initiate patient on protonix, transfuse platelets, follow hemoglobin level, no active bleeding at present. Get gastroenterologist involved and this needed All the records are reviewed and case discussed with ED provider. Management plans discussed with the patient, family and they are in agreement.  CODE STATUS: Code Status History    Date Active Date Inactive Code Status Order ID Comments User Context   04/06/2016  3:39 AM 04/14/2016 12:51 AM Full Code [id number]  Harrie Foreman, MD Inpatient       TOTAL TIME TAKING CARE OF THIS PATIENT: 60 minutes.   Discussed this patient's family Rocket Gunderson M.D on 04/25/2016 at 7:20 PM  Between 7am to 6pm - Pager - [phone] After 6pm go to [email or web address] - password [email or web address] [facility]  Office  [phone]  CC: Primary care physician; Alfonse Flavors, MD

## 2016-04-25 NOTE — ED Notes (Signed)
Patient transported to X-ray 

## 2016-04-25 NOTE — ED Provider Notes (Signed)
[facility] Emergency Department Provider Note    First MD Initiated Contact with Patient 04/25/16 1623     (approximate)  I have reviewed the triage vital signs and the nursing notes.   HISTORY  Chief Complaint Altered Mental Status  Level V Caveat: AMS  HPI Clevester Helzer is a 70 y.o. male with history of multiple myeloma presents with diffuse myalgia and malaise after receiving abdominal radiation therapy this morning. Nurse came to check on him and found that he was markedly tachycardic into But was afebrile.  They were concerned as the patient was more confused than normal. The patient denies any focal complaints or discomfort. He denies any nausea. He denies any shortness of breath or chest pain. Admits to decreased oral intake. Denies any cough. No recent sick contacts.   Past Medical History:  Diagnosis Date  . Anemia   . Cancer (HCC)    Bone metastasis  . Constipation   . Hearing loss   . Hoarse voice quality   . Liver lesion   . Malaria 2003  . Multiple myeloma ([place]) 03/22/2016   Per Dr. Rogue Bussing on his PET order.  . Tuberculosis 1985    Patient Active Problem List   Diagnosis Date Noted  . Pancytopenia ([place]) 04/25/2016  . Metabolic encephalopathy 28/00/3491  . Healthcare-associated pneumonia 04/25/2016  . Elevated transaminase level 04/25/2016  . SIRS (systemic inflammatory response syndrome) ([place]) 04/25/2016  . Anorexia   . Abnormal loss of weight   . Intractable vomiting with nausea 04/06/2016  . Dehydration 04/05/2016  . Multiple myeloma in relapse ([place]) 03/16/2016  . Pernicious anemia 03/16/2016    Past Surgical History:  Procedure Laterality Date  . RETINAL DETACHMENT SURGERY  2006    Prior to Admission medications   Medication Sig Start Date End Date Taking? Authorizing Provider  acyclovir (ZOVIRAX) 400 MG tablet Take 1 tablet (400 mg total) by mouth daily. 04/19/16   Cammie Sickle, MD  alum & mag  hydroxide-simeth (MAALOX/MYLANTA) 200-200-20 MG/5ML suspension Take 30 mLs by mouth every 4 (four) hours as needed for indigestion or heartburn. Patient not taking: Reported on 04/19/2016 04/13/16   [name], MD  cholecalciferol (VITAMIN D) 1000 units tablet Take 1,000 Units by mouth daily.    Historical Provider, MD  dronabinol (MARINOL) 2.5 MG capsule Take 1 capsule (2.5 mg total) by mouth 2 (two) times daily before lunch and supper. Patient not taking: Reported on 04/19/2016 04/13/16   [name], MD  esomeprazole (NEXIUM) 40 MG capsule Take 1 capsule (40 mg total) by mouth 2 (two) times daily before a meal. 04/19/16   Cammie Sickle, MD  fentaNYL (DURAGESIC - DOSED MCG/HR) 12 MCG/HR Place 1 patch (12.5 mcg total) onto the skin every 3 (three) days. Patient not taking: Reported on 04/19/2016 04/15/16   [name], MD  HYDROcodone-acetaminophen (NORCO/VICODIN) 5-325 MG tablet Take 1 tablet by mouth every 4 (four) hours as needed for moderate pain. 04/13/16   [name], MD  lactulose (CHRONULAC) 10 GM/15ML solution Take 30 mLs (20 g total) by mouth 2 (two) times daily as needed for mild constipation, moderate constipation or severe constipation. Patient not taking: Reported on 04/19/2016 04/13/16   [name], MD  magnesium hydroxide (MILK OF MAGNESIA) 400 MG/5ML suspension Take 5 mLs by mouth daily as needed for mild constipation.    Historical Provider, MD  ondansetron (ZOFRAN ODT) 8 MG disintegrating tablet Take 1 tablet (8 mg total) by mouth every 8 (eight)  hours as needed for nausea or vomiting. 04/22/16   Cammie Sickle, MD  ondansetron (ZOFRAN) 8 MG tablet Take 1 tablet (8 mg total) by mouth every 8 (eight) hours as needed for nausea or vomiting. 03/24/16   Cammie Sickle, MD  phosphorus (K-PHOS-NEUTRAL) [phone] MG tablet Take 1 tablet (250 mg total) by mouth 2 (two) times daily. Patient not taking: Reported on 04/19/2016 04/13/16    [name], MD  prochlorperazine (COMPAZINE) 10 MG tablet Take 1 tablet (10 mg total) by mouth every 6 (six) hours as needed for nausea or vomiting. Patient not taking: Reported on 04/19/2016 03/24/16   Cammie Sickle, MD  sertraline (ZOLOFT) 25 MG tablet Take 1 tablet (25 mg total) by mouth daily. Patient not taking: Reported on 04/19/2016 04/13/16   [name], MD  sucralfate (CARAFATE) 1 g tablet Take 1 tablet (1 g total) by mouth 4 (four) times daily -  with meals and at bedtime. Patient not taking: Reported on 04/19/2016 04/13/16   [name], MD  thiamine (VITAMIN B-1) 100 MG tablet Take 1 tablet (100 mg total) by mouth daily. Patient not taking: Reported on 04/19/2016 04/13/16   [name], MD  vitamin B-12 (CYANOCOBALAMIN) 1000 MCG tablet Take 1,000 mcg by mouth daily.    Historical Provider, MD  Vitamin D, Ergocalciferol, (DRISDOL) 50000 units CAPS capsule Take 50,000 Units by mouth every 7 (seven) days.    Historical Provider, MD    Allergies Review of patient's allergies indicates no known allergies.  No family history on file.  Social History Social History  Substance Use Topics  . Smoking status: Never Smoker  . Smokeless tobacco: Never Used  . Alcohol use No    Review of Systems Patient denies headaches, rhinorrhea, blurry vision, numbness, shortness of breath, chest pain, edema, cough, abdominal pain, nausea, vomiting, diarrhea, dysuria, fevers, rashes or hallucinations unless otherwise stated above in HPI. ____________________________________________   PHYSICAL EXAM:  VITAL SIGNS: Vitals:   04/25/16 2210 04/25/16 2230  BP: 106/60 (!) 99/59  Pulse: (!) 113 (!) 116  Resp: 18 16  Temp: 99.2 F (37.3 C) 99.2 F (37.3 C)    Constitutional: Alert But frail and cachectic appearing.  Eyes: Conjunctivae are normal. PERRL. EOMI. Head: Atraumatic. Nose: No congestion/rhinnorhea. Mouth/Throat: Mucous membranes are dry.   Oropharynx non-erythematous. Neck: No stridor. Painless ROM. No cervical spine tenderness to palpation Hematological/Lymphatic/Immunilogical: No cervical lymphadenopathy. Cardiovascular: tachycardic, regular rhythm. Grossly normal heart sounds.  Good peripheral circulation. Respiratory: mildly tachypnic.  No retractions. Lungs CTAB. Gastrointestinal: Soft and nontender. No distention. No abdominal bruits. No CVA tenderness. Musculoskeletal: No lower extremity tenderness nor edema.  No joint effusions. Neurologic:  Normal speech and language. No gross focal neurologic deficits are appreciated. No gait instability. Skin:  Skin is warm, dry and intact. No rash noted.  + skin tenting Psychiatric: Mood and affect are normal. Speech and behavior are normal.  ____________________________________________   LABS (all labs ordered are listed, but only abnormal results are displayed)  Results for orders placed or performed during the hospital encounter of 04/25/16 (from the past 24 hour(s))  CBC with Differential/Platelet     Status: Abnormal   Collection Time: 04/25/16  4:32 PM  Result Value Ref Range   WBC 2.1 (L) 3.8 - 10.6 K/uL   RBC 3.58 (L) 4.40 - 5.90 MIL/uL   Hemoglobin 10.3 (L) 13.0 - 18.0 g/dL   HCT 30.3 (L) 40.0 - 52.0 %   MCV 84.6 80.0 - 100.0 fL  MCH 28.6 26.0 - 34.0 pg   MCHC 33.9 32.0 - 36.0 g/dL   RDW 17.3 (H) 11.5 - 14.5 %   Platelets 38 (L) 150 - 440 K/uL   Neutrophils Relative % 75 %   Lymphocytes Relative 5 %   Monocytes Relative 4 %   Eosinophils Relative 1 %   Basophils Relative 0 %   Band Neutrophils 13 %   Metamyelocytes Relative 2 %   Myelocytes 0 %   Promyelocytes Absolute 0 %   Blasts 0 %   nRBC 10 (H) 0 /100 WBC   Other 0 %   Neutro Abs 1.9 1.4 - 6.5 K/uL   Lymphs Abs 0.1 (L) 1.0 - 3.6 K/uL   Monocytes Absolute 0.1 (L) 0.2 - 1.0 K/uL   Eosinophils Absolute 0.0 0 - 0.7 K/uL   Basophils Absolute 0.0 0 - 0.1 K/uL   RBC Morphology MIXED RBC POPULATION     Smear Review LARGE PLATELETS PRESENT   Comprehensive metabolic panel     Status: Abnormal   Collection Time: 04/25/16  4:32 PM  Result Value Ref Range   Sodium 144 135 - 145 mmol/L   Potassium 4.3 3.5 - 5.1 mmol/L   Chloride 116 (H) 101 - 111 mmol/L   CO2 26 22 - 32 mmol/L   Glucose, Bld 89 65 - 99 mg/dL   BUN 51 (H) 6 - 20 mg/dL   Creatinine, Ser 0.72 0.61 - 1.24 mg/dL   Calcium 8.1 (L) 8.9 - 10.3 mg/dL   Total Protein 5.8 (L) 6.5 - 8.1 g/dL   Albumin 2.0 (L) 3.5 - 5.0 g/dL   AST 40 15 - 41 U/L   ALT 65 (H) 17 - 63 U/L   Alkaline Phosphatase 136 (H) 38 - 126 U/L   Total Bilirubin 0.9 0.3 - 1.2 mg/dL   GFR calc non Af Amer >60 >60 mL/min   GFR calc Af Amer >60 >60 mL/min   Anion gap 2 (L) 5 - 15  Lactic acid, plasma     Status: None   Collection Time: 04/25/16  4:32 PM  Result Value Ref Range   Lactic Acid, Venous 1.3 0.5 - 1.9 mmol/L  Magnesium     Status: None   Collection Time: 04/25/16  4:32 PM  Result Value Ref Range   Magnesium 2.2 1.7 - 2.4 mg/dL  Phosphorus     Status: None   Collection Time: 04/25/16  4:32 PM  Result Value Ref Range   Phosphorus 2.7 2.5 - 4.6 mg/dL  Brain natriuretic peptide     Status: Abnormal   Collection Time: 04/25/16  4:32 PM  Result Value Ref Range   B Natriuretic Peptide 121.0 (H) 0.0 - 100.0 pg/mL  Prepare Pheresed Platelets     Status: None (Preliminary result)   Collection Time: 04/25/16  6:34 PM  Result Value Ref Range   Unit Number [id number]    Blood Component Type PLTP LI1 PAS    Unit division 00    Status of Unit ISSUED    Transfusion Status OK TO TRANSFUSE   Fibrinogen     Status: Abnormal   Collection Time: 04/25/16  7:00 PM  Result Value Ref Range   Fibrinogen 626 (H) 210 - 475 mg/dL  Protime-INR     Status: Abnormal   Collection Time: 04/25/16  7:00 PM  Result Value Ref Range   Prothrombin Time 18.5 (H) 11.4 - 15.2 seconds   INR 1.52    ____________________________________________  EKG  My review and personal  interpretation at Time: 16:26   Indication: sob  Rate: 115  Rhythm: sinus Axis: normal Other: nonspecific t wave changes, no acute ischmeia ____________________________________________  RADIOLOGY  I personally reviewed all radiographic images ordered to evaluate for the above acute complaints and reviewed radiology reports and findings.  These findings were personally discussed with the patient.  Please see medical record for radiology report.  ____________________________________________   PROCEDURES  Procedure(s) performed: none    Critical Care performed: no ____________________________________________   INITIAL IMPRESSION / ASSESSMENT AND PLAN / ED COURSE  Pertinent labs & imaging results that were available during my care of the patient were reviewed by me and considered in my medical decision making (see chart for details).  DDX: sepsis, dejyderation, electrolyte abn, pna, cva, metastasis  Saw Mendenhall is a 70 y.o. who presents to the ED with altered mental status status post radiation treatment with a history of multiple myeloma with active treatment. Patient is afebrile but tachycardic and to. Appears very frail and cachectic. His abdominal exam is reassuring soft and benign. EKG shows no evidence of ischemia or conduction abnormalities. Patient does appear per family dehydrated. We'll give IV fluids. Given his tachypnea will order chest x-ray to evaluate for pneumonia.  CT head will be ordered to evaluate for acute intracranial abnormality. The patient will be placed on continuous pulse oximetry and telemetry for monitoring.  Laboratory evaluation will be sent to evaluate for the above complaints.     Clinical Course  Value Comment By Time  Hemoglobin: (!) 10.3 (Reviewed) Merlyn Lot, MD 10/16 1751  WBC: PENDING (Reviewed) Merlyn Lot, MD 10/16 1751  Platelets: (!) 38 (Reviewed) Merlyn Lot, MD 10/16 1803   Guaiac positive.  No frank melena.  Spoek  with Dr. Mike Gip who recommends transfusing platelets in the setting of guaiac positive stools with thrombocytopenia.  Patient with leukopenia but acute bandemia and CXR with new infiltrate and tachypnea concerning for HCAP.  Lactic acid is normal.  Patient with acute pre-renal azotemia.  Patient will be admitted to hospitalist service for further evaluation and management. Have discussed with the patient and available family all diagnostics and treatments performed thus far and all questions were answered to the best of my ability. The patient demonstrates understanding and agreement with plan.  Merlyn Lot, MD 10/16 Bosie Helper     ____________________________________________   FINAL CLINICAL IMPRESSION(S) / ED DIAGNOSES  Final diagnoses:  HCAP (healthcare-associated pneumonia)  Sepsis, due to unspecified organism ([place])  Thrombocytopenia ([place])  Guaiac positive stools  Multiple myeloma, remission status unspecified ([place])      NEW MEDICATIONS STARTED DURING THIS VISIT:  Current Discharge Medication List       Note:  This document was prepared using Dragon voice recognition software and may include unintentional dictation errors.    Merlyn Lot, MD 04/25/16 [phone]

## 2016-04-25 NOTE — ED Triage Notes (Addendum)
Pt to ED via EMS from home. Pt has multiple myeloma with metastising per EMS. Pt is taking chemo and radiation, last radiation was this am and in home CNA states pt became altered afterward. Per EMS pt was 130s HR , 130/88, and 97.2, glucose 80. EMS gave 1L of fluid en route.  Pt A&O to self and place.

## 2016-04-25 NOTE — ED Notes (Signed)
Pt placed on neutropenic precautions

## 2016-04-26 ENCOUNTER — Inpatient Hospital Stay: Payer: Medicare Other

## 2016-04-26 ENCOUNTER — Ambulatory Visit: Payer: Medicare Other

## 2016-04-26 ENCOUNTER — Inpatient Hospital Stay: Payer: Medicare Other | Admitting: Internal Medicine

## 2016-04-26 ENCOUNTER — Ambulatory Visit: Payer: Medicaid Other

## 2016-04-26 ENCOUNTER — Telehealth: Payer: Self-pay | Admitting: Internal Medicine

## 2016-04-26 DIAGNOSIS — R627 Adult failure to thrive: Secondary | ICD-10-CM

## 2016-04-26 DIAGNOSIS — A419 Sepsis, unspecified organism: Secondary | ICD-10-CM | POA: Diagnosis not present

## 2016-04-26 DIAGNOSIS — R634 Abnormal weight loss: Secondary | ICD-10-CM

## 2016-04-26 DIAGNOSIS — E43 Unspecified severe protein-calorie malnutrition: Secondary | ICD-10-CM | POA: Diagnosis present

## 2016-04-26 DIAGNOSIS — D61818 Other pancytopenia: Secondary | ICD-10-CM

## 2016-04-26 DIAGNOSIS — E860 Dehydration: Secondary | ICD-10-CM | POA: Diagnosis not present

## 2016-04-26 DIAGNOSIS — R5383 Other fatigue: Secondary | ICD-10-CM

## 2016-04-26 DIAGNOSIS — J189 Pneumonia, unspecified organism: Secondary | ICD-10-CM | POA: Diagnosis not present

## 2016-04-26 DIAGNOSIS — R4182 Altered mental status, unspecified: Secondary | ICD-10-CM | POA: Diagnosis not present

## 2016-04-26 DIAGNOSIS — Z9221 Personal history of antineoplastic chemotherapy: Secondary | ICD-10-CM

## 2016-04-26 DIAGNOSIS — C9000 Multiple myeloma not having achieved remission: Secondary | ICD-10-CM | POA: Diagnosis not present

## 2016-04-26 LAB — BASIC METABOLIC PANEL
ANION GAP: 5 (ref 5–15)
BUN: 37 mg/dL — ABNORMAL HIGH (ref 6–20)
CO2: 21 mmol/L — AB (ref 22–32)
Calcium: 7.5 mg/dL — ABNORMAL LOW (ref 8.9–10.3)
Chloride: 122 mmol/L — ABNORMAL HIGH (ref 101–111)
Creatinine, Ser: 0.75 mg/dL (ref 0.61–1.24)
GFR calc Af Amer: >60 mL/min (ref >60)
GLUCOSE: 95 mg/dL (ref 65–99)
POTASSIUM: 3.8 mmol/L (ref 3.5–5.1)
Sodium: 148 mmol/L — ABNORMAL HIGH (ref 135–145)

## 2016-04-26 LAB — CBC
HEMATOCRIT: 26.7 % — AB (ref 40.0–52.0)
HEMOGLOBIN: 8.9 g/dL — AB (ref 13.0–18.0)
MCH: 28.3 pg (ref 26.0–34.0)
MCHC: 33.4 g/dL (ref 32.0–36.0)
MCV: 84.7 fL (ref 80.0–100.0)
Platelets: 61 10*3/uL — ABNORMAL LOW (ref 150–440)
RBC: 3.16 MIL/uL — AB (ref 4.40–5.90)
RDW: 17.6 % — ABNORMAL HIGH (ref 11.5–14.5)
WBC: 1.8 10*3/uL — ABNORMAL LOW (ref 3.8–10.6)

## 2016-04-26 LAB — URINALYSIS COMPLETE WITH MICROSCOPIC (ARMC ONLY)
BILIRUBIN URINE: NEGATIVE
Bacteria, UA: NONE SEEN
Glucose, UA: 50 mg/dL — AB
Hgb urine dipstick: NEGATIVE
KETONES UR: NEGATIVE mg/dL
Leukocytes, UA: NEGATIVE
Nitrite: NEGATIVE
Protein, ur: NEGATIVE mg/dL
SQUAMOUS EPITHELIAL / LPF: NONE SEEN
Specific Gravity, Urine: 1.017 (ref 1.005–1.030)
pH: 6 (ref 5.0–8.0)

## 2016-04-26 LAB — PREPARE PLATELET PHERESIS: UNIT DIVISION: 0

## 2016-04-26 LAB — GLUCOSE, CAPILLARY: Glucose-Capillary: 88 mg/dL (ref 65–99)

## 2016-04-26 LAB — MRSA PCR SCREENING: MRSA by PCR: NEGATIVE

## 2016-04-26 MED ORDER — IOPAMIDOL (ISOVUE-370) INJECTION 76%
75.0000 mL | Freq: Once | INTRAVENOUS | Status: AC | PRN
  Administered 2016-04-26: 75 mL via INTRAVENOUS

## 2016-04-26 MED ORDER — PIPERACILLIN-TAZOBACTAM 3.375 G IVPB
3.3750 g | Freq: Three times a day (TID) | INTRAVENOUS | Status: DC
  Administered 2016-04-26 – 2016-05-04 (×22): 3.375 g via INTRAVENOUS
  Filled 2016-04-26 (×24): qty 50

## 2016-04-26 MED ORDER — AZITHROMYCIN 250 MG PO TABS: 250.0000 mg | ORAL_TABLET | Freq: Every day | ORAL | Status: DC

## 2016-04-26 MED ORDER — ENSURE ENLIVE PO LIQD
237.0000 mL | Freq: Three times a day (TID) | ORAL | Status: DC
  Administered 2016-04-26 – 2016-04-27 (×3): 237 mL via ORAL

## 2016-04-26 MED ORDER — MAGNESIUM HYDROXIDE 400 MG/5ML PO SUSP: 30.0000 mL | Freq: Two times a day (BID) | ORAL | Status: AC

## 2016-04-26 MED ORDER — ALBUTEROL SULFATE (2.5 MG/3ML) 0.083% IN NEBU
2.5000 mg | INHALATION_SOLUTION | Freq: Three times a day (TID) | RESPIRATORY_TRACT | Status: DC
Start: 2016-04-27 — End: 2016-04-29
  Administered 2016-04-27 – 2016-04-28 (×3): 2.5 mg via RESPIRATORY_TRACT
  Filled 2016-04-26 (×5): qty 3

## 2016-04-26 MED ORDER — AZITHROMYCIN 500 MG PO TABS
500.0000 mg | ORAL_TABLET | Freq: Every day | ORAL | Status: AC
  Administered 2016-04-26: 21:00:00 500 mg via ORAL
  Filled 2016-04-26: qty 1

## 2016-04-26 NOTE — Progress Notes (Signed)
Initial Nutrition Assessment  DOCUMENTATION CODES:   Severe malnutrition in context of chronic illness  INTERVENTION:  -Spoke with MD Mody regarding TPN.  MD not planning to restart TPN at this time.  Palliative care being consulted.  Recommend if family wants aggressive care nutrition care PEG tube should be discussed vs TPN.  Discussed with MD Mody. -Planning regular diet at this time. Discussed options with Dtr and how to order meals from the kitchen. Encouraged dtr to bring in pt favorite foods -Recommend Ensure Enlive po TID, each supplement provides 350 kcal and 20 grams of protein as has been drinking at home -Discussed ways to increase kcals and protein with dtr. Verbalized understanding    NUTRITION DIAGNOSIS:   Malnutrition related to chronic illness, cancer and cancer related treatments as evidenced by severe depletion of body fat, severe depletion of muscle mass, percent weight loss.    GOAL:   Patient will meet greater than or equal to 90% of their needs    MONITOR:   PO intake, Supplement acceptance, Labs, Weight trends  REASON FOR ASSESSMENT:   Malnutrition Screening Tool, Consult Assessment of nutrition requirement/status  ASSESSMENT:      70 y.o male admitted with AMS, SOB, possible pneumonia.  Pt with history of multiple myeloma, with bone mets receiving radiation and chemotherapy( 3 days ago).  Has been having diarrhea for 2 days as well.    Spoke with pt dtr at bedside and reports pt intake has been up and down for the last 2 months, mostly decreased.  Pt has been eating mostly soups (lentil), liquid foods.  Took few bites of cream of wheat this am, few sips of sprite and few bites of muffin this am.  Dtr reports pt has been on home TPN up until yesterday when admitted to hospital.  Medications reviewed:   Labs reviewed: Na 148  Nutrition-Focused physical exam completed. Findings are severe fat depletion, severe muscle depletion, and no edema.      Diet Order:  DIET SOFT Room service appropriate? Yes; Fluid consistency: Thin  Skin:  Reviewed, no issues  Last BM:  10/15  Height:   Ht Readings from Last 1 Encounters:  04/25/16 5' 10"  (1.778 m)    Weight: 13% wt loss in the last month  Wt Readings from Last 1 Encounters:  04/25/16 94 lb 1.9 oz (42.7 kg)    Ideal Body Weight:     BMI:  Body mass index is 13.5 kg/m.  Estimated Nutritional Needs:   Kcal:  1300-1500 kcals/d  Protein:  52-65 g/d  Fluid:  >/= 1.3 L/c  EDUCATION NEEDS:   Education needs addressed  Francisco [name] B. Zenia Resides, [place], [place] (pager) Weekend/On-Call pager [phone])

## 2016-04-26 NOTE — Progress Notes (Signed)
Patient had a non productive cough after neb treatment.

## 2016-04-26 NOTE — Progress Notes (Signed)
ID addendum Reviewed CT - shows esophagus dilated by air and fluid  As welll as bil LL infiltrates with dense consolidation in LLL.    I suspect given the esophageal findings and the LL infilltrates that this is aspiration.  Will change cefepime to zosyn for broader anaerobic coverage Add azitromycin

## 2016-04-26 NOTE — Consult Note (Signed)
  CC: Consult requested for evaluation of Sob and failure to thrive  HPI;70 yr gentleman who was diagnosed with multiple myeloma. Had been under the care of Dr. Gerhard Munch receiving Velcade for about 4-6 weeks and Revlimid was begun last week, He could not tolerate decadron, therefore discontinued.  He had been loosing weight and had been eatinG and drinking poorly for several weeks. Daughter noticed, he had been having Sob at rest and brought him to the Ed.  His Cxr showed interstiTal infiltrates, suSpcisous for pnemonia. He is admitted and started on IV antibiotics and an ID consult had been requested  His past medical, social and family history has been reviewed form the initail consult note by Dr. Lindon Romp from last admission and outpatient clinic notes and remains unchanged No fevvr, cough or hemoptyiss No reported pain daughter reports lethargy  No headache or falls, diarrhea lasted for 3 days last week after Velcade, now stopped  No interest in food and had hardly been drinking or eating [place] of weight reported, no nausea or vomiting. All other sytem review is negative  Physical exam: Lethargic, communicative, pale, no petechiae No acute distress No rashes No pedal edema Abdomen is soft non tender No oral thrush, skin dry , mucus membranes dry Lungs clear, no use of accessory muscles of respiration  Neck supple, no palpable lymphadenopathy in the neck, cervical or submandibular regions PERLA/EOMI,  Oriented No focal motor or sensory deficits cranial nerves are intact   Result review:   plt 65 Hgb 8 Calcium 7.5, albumin 2 Sodum 148  cretainien - stable    Ct head since admssion - negative for acute infarcats or bleed IMPRESSION: Mild atrophic changes.  No acute abnormality is noted.   Assessment and Plan:  Dehydration and anorexia, severe malnutrition. The cause of anorexia is not clear. He is a poor historian and is not very forthcoming with his  symptoms. As per speech and swallow evaluation, he has no swallowing difficulty. He had been on TPN via the right arm PICC line until this admisison,  I think a PEG is a better option for long term nutrition with less risk of infection than TPN I have contacted Gi and requested a consult for the same. He will also benefit form an EGD to look into any anatomic pathology that could explain his anorexia.  Acute SOB upon ar rival that is resolved now. He is high risk for Pe, and DVT I have ordered a Ct angio, but he refused , stating he will wait for Dr. Gerhard Munch to discuss  He has been seen by pulmonary and is currenetly under airborne isolation until TB could be ruled out. His CXR suggests intersttial infiltrates, that could represent an atypical pneumonia. He is currently on broad spectrum antibiotics to cover for pneumonia and Vancomycin to cover for any line infection Blood cultures so far shows no growth, will continue to follow  Pancytopenia due to myeloma, and Velcade treatment. No bleeding, if platelet count remains above 50, will start prophylactic Lovenox 40 mg [place]  STOP Revlimid  Check random cortisol level. And TSH  I will review with Dr. Gerhard Munch and will sign off to him tomorrow am.

## 2016-04-26 NOTE — Progress Notes (Signed)
Patient passed swallow screen.  Diet order for soft diet added per MD admit instructions.

## 2016-04-26 NOTE — Plan of Care (Signed)
Problem: Nutrition: Goal: Adequate nutrition will be maintained Outcome: Not Progressing Poor appetite. Denies nausea. Decline nausea medication. Sips of Ensure. GI consult pending.

## 2016-04-26 NOTE — Telephone Encounter (Signed)
Francisco [name] wanted to make sure you know that pt is in room 118. She said he is not eating and is lethargic. Family would like for Dr. B to come by room to speak with them if he can please. Thank you.

## 2016-04-26 NOTE — Progress Notes (Signed)
SLP Cancellation Note  Patient Details Name: Francisco [name] MRN: [id number] DOB: Apr 14, 1946   Cancelled treatment:       Reason Eval/Treat Not Completed: SLP screened, no needs identified, will sign off at this time w/ NSG/MD to reconsult if any change in status. Daughters/pt agreed. SLP reviewed chart notes, previous evaluation, and consulted w/ Daughters/pt in room. Pt nodded and indicated he swallowed the TSPs of liquid he accepts (intermittently) from Daughters. Daughters stated they are able to mix his crushed medicines in a TSP of Sprite which he will accept followed by a TSP of "regular Sprite". At other times, pt accepts 2-3 TSPs of soup but then refuses anything more. Pt and Daughters deny any overt s/s of aspiration; pt nodded that he still gets the hiccups "sometimes". This description of pt's current presentation w/ oral intake appears similar to his presentation last admission. Daughters did not feel there were any changes in his swallowing; he was just "refusing food and liquid". No evaluation was performed at this time as pt appears at/near his baseline; noted Palliative Care consult for poc. Instructed Daughters/pt on general aspiration precautions w/ any oral intake as they follow w/ him. NSG present and updated.    Quorra Rosene 04/26/2016, 2:27 PM

## 2016-04-26 NOTE — Plan of Care (Signed)
Problem: Pain Managment: Goal: General experience of comfort will improve Outcome: Progressing Patient lethargic upon admittance to the floor.  Received 1 unit platelets.  Patient more alert after transfusion and up to [facility].  Problem: Nutrition: Goal: Adequate nutrition will be maintained Outcome: Not Progressing Patient on TPN at home.

## 2016-04-26 NOTE — Progress Notes (Addendum)
[facility] [place] NAME: Jafeth Mustin    MR#:  [id number]  DATE OF BIRTH:  1945-11-23  SUBJECTIVE:   Patient here with lethargy Has appetite this am Was on TPN   REVIEW OF SYSTEMS:    Review of Systems  Constitutional: Positive for fever, malaise/fatigue and weight loss. Negative for chills.  HENT: Negative.  Negative for ear discharge, ear pain, hearing loss, nosebleeds and sore throat.   Eyes: Negative.  Negative for blurred vision and pain.  Respiratory: Negative.  Negative for cough, hemoptysis, shortness of breath and wheezing.   Cardiovascular: Negative.  Negative for chest pain, palpitations and leg swelling.  Gastrointestinal: Negative.  Negative for abdominal pain, blood in stool, diarrhea, nausea and vomiting.  Genitourinary: Negative.  Negative for dysuria.  Musculoskeletal: Positive for back pain.  Skin: Negative.   Neurological: Positive for weakness. Negative for dizziness, tremors, speech change, focal weakness, seizures and headaches.  Endo/Heme/Allergies: Negative.  Does not bruise/bleed easily.  Psychiatric/Behavioral: Negative.  Negative for depression, hallucinations and suicidal ideas.    Tolerating Diet: has appetitie      DRUG ALLERGIES:  No Known Allergies  VITALS:  Blood pressure 119/69, pulse 100, temperature 98.4 F (36.9 C), temperature source Oral, resp. rate (!) 24, height 5' 10"  (1.778 m), weight 42.7 kg (94 lb 1.9 oz), SpO2 94 %.  PHYSICAL EXAMINATION:   Physical Exam  Constitutional: He is well-developed, well-nourished, and in no distress. No distress.  frail  HENT:  Head: Normocephalic.  Eyes: No scleral icterus.  Neck: Normal range of motion. Neck supple. No JVD present. No tracheal deviation present.  Cardiovascular: Normal rate, regular rhythm and normal heart sounds.  Exam reveals no gallop and no friction rub.   No murmur heard. Pulmonary/Chest: Effort normal and breath  sounds normal. No respiratory distress. He has no wheezes. He has no rales. He exhibits no tenderness.  Abdominal: Soft. Bowel sounds are normal. He exhibits no distension and no mass. There is no tenderness. There is no rebound and no guarding.  Musculoskeletal: Normal range of motion. He exhibits no edema.  Neurological: He is alert.  Skin: Skin is warm. No rash noted. No erythema.  Psychiatric: Affect normal.      LABORATORY PANEL:   CBC  Recent Labs Lab 04/26/16 0500  WBC 1.8*  HGB 8.9*  HCT 26.7*  PLT 61*   ------------------------------------------------------------------------------------------------------------------  Chemistries   Recent Labs Lab 04/25/16 1632 04/26/16 0500  NA 144 148*  K 4.3 3.8  CL 116* 122*  CO2 26 21*  GLUCOSE 89 95  BUN 51* 37*  CREATININE 0.72 0.75  CALCIUM 8.1* 7.5*  MG 2.2  --   AST 40  --   ALT 65*  --   ALKPHOS 136*  --   BILITOT 0.9  --    ------------------------------------------------------------------------------------------------------------------  Cardiac Enzymes No results for input(s): TROPONINI in the last 168 hours. ------------------------------------------------------------------------------------------------------------------  RADIOLOGY:  Dg Chest 2 View  Result Date: 04/25/2016 CLINICAL DATA:  Shortness of breath after chemo and radiation treatment earlier today. EXAM: CHEST  2 VIEW COMPARISON:  04/11/2016 FINDINGS: Two views study shows cardiomegaly. Interval development of interstitial and central airspace disease, right greater than left. Right-sided central line tip projects at the mid SVC level. Telemetry leads overlie the chest. IMPRESSION: Cardiomegaly with interstitial and asymmetric airspace disease, right greater than left, new in the interval. Asymmetric pulmonary edema or diffuse infection would be considerations. Electronically Signed   By:  Misty Stanley M.D.   On: 04/25/2016 17:24   Ct Head Wo  Contrast  Result Date: 04/25/2016 CLINICAL DATA:  History of multiple myeloma and altered mental status EXAM: CT HEAD WITHOUT CONTRAST TECHNIQUE: Contiguous axial images were obtained from the base of the skull through the vertex without intravenous contrast. COMPARISON:  None FINDINGS: Brain: No evidence of acute infarction, hemorrhage, hydrocephalus, extra-axial collection or mass lesion/mass effect. Mild atrophic changes are noted. Vascular: No hyperdense vessel or unexpected calcification. Skull: Normal. Negative for fracture or focal lesion. Sinuses/Orbits: No acute finding. Other: None. IMPRESSION: Mild atrophic changes.  No acute abnormality is noted. Electronically Signed   By: Inez Catalina M.D.   On: 04/25/2016 18:22     ASSESSMENT AND PLAN:   70 year old male with a history of metastatic multiple myeloma recently discharged due to intractable nausea and vomiting presents with sepsis due to pneumonia.   1. Sepsis due to pneumonia: Continue cefepime and vancomycin Follow-up on MRSA PCR Blood cultures are negative thus far Prolactin level ordered  2. Metabolic encephalopathy in the setting of sepsis and pneumonia: Slowly resolving  3. Metastatic multiple myeloma: Oncology consult pending Palliative care consult placed as per wishes of family   4. Severe malnutrition in the setting of metastatic multiple myeloma: Dietary consult I would not recommend restarting TPN at this time due to multiple complications with TPN and life expectancy 5. Pancytopenia in the setting of recent chemotherapy Oncology evaluation Patient was transfused platelets     Management plans discussed with the patient and family and he is in agreement.  CODE STATUS: full  TOTAL TIME TAKING CARE OF THIS PATIENT: 31 minutes.     POSSIBLE D/C 2-3 days, DEPENDING ON CLINICAL CONDITION.   Aaira Oestreicher M.D on 04/26/2016 at 10:47 AM  Between 7am to 6pm - Pager - [phone] After 6pm go to  [email or web address] - password [email or web address] [facility]  Office  [phone]  CC: Primary care physician; Alfonse Flavors, MD  Note: This dictation was prepared with Dragon dictation along with smaller phrase technology. Any transcriptional errors that result from this process are unintentional.

## 2016-04-26 NOTE — Progress Notes (Signed)
Nutrition Brief Note RD received a page for from Dr. Sherrine Maples  For desire to start PPN on [place] for 1-2 days until patient can have a J-Tube placed.  Of note, patient has PICC line. Discussed with MD concerns with risk of infection related to short term nutrition support through central line. Discussed with Pharmacy, per protocol, TPN cannot be hung after 1400, will hang D10 until tomorrow.  Follow up tomorrow.  Satira Anis. Korey Prashad, MS, RD LDN Inpatient Clinical Dietitian Pager [phone]

## 2016-04-26 NOTE — Consult Note (Signed)
[facility] Infectious Disease     Reason for Consult Abnormal CT chest    Referring Physician: Bettey Costa Date of Admission:  04/25/2016   Active Problems:   Pancytopenia ([place])   Metabolic encephalopathy   Healthcare-associated pneumonia   Elevated transaminase level   SIRS (systemic inflammatory response syndrome) (HCC)   HPI: Francisco [name] is a 70 y.o. male with hx of multiple myeloma dxed in Sept 2017, with bone pain and wt loss.  He had T spine vert fracture and has been seen by onc and rad onc. Started on chemo.   He has also had hoarseness.  Low grade fevers.  Has lost significant wt. This admission he was brought in by family for AMS, somnolence, SON and shakes.  He has also had some diarrhea following velcade treatment. He has had progressive pancytopenia and cxr shows airspace disease R> L concerning for infection or edema. He has had some cough but non productive  Past Medical History:  Diagnosis Date  . Anemia   . Cancer (HCC)    Bone metastasis  . Constipation   . Hearing loss   . Hoarse voice quality   . Liver lesion   . Malaria 2003  . Multiple myeloma ([place]) 03/22/2016   Per Dr. Rogue Bussing on his PET order.  . Tuberculosis 1985   Past Surgical History:  Procedure Laterality Date  . RETINAL DETACHMENT SURGERY  2006   Social History  Substance Use Topics  . Smoking status: Never Smoker  . Smokeless tobacco: Never Used  . Alcohol use No   No family history on file.  Allergies: No Known Allergies  Current antibiotics: Antibiotics Given (last 72 hours)    Date/Time Action Medication Dose Rate   04/25/16 1800 Given   ceFEPIme (MAXIPIME) 1 GM / 68m IVPB premix 1 g 100 mL/hr   04/25/16 2136 Given   acyclovir (ZOVIRAX) 200 MG capsule 400 mg 400 mg    04/25/16 2258 Given   ceFEPIme (MAXIPIME) 2 GM / 585mIVPB premix 2 g 100 mL/hr   04/26/16 1127 Given   ceFEPIme (MAXIPIME) 2 GM / 5084mVPB premix 2 g 100 mL/hr   04/26/16 1156 Given   vancomycin  (VANCOCIN) IVPB 750 mg/150 ml premix 750 mg 150 mL/hr      MEDICATIONS: . acyclovir  400 mg Oral Daily  . albuterol  2.5 mg Nebulization Q6H  . ceFEPime (MAXIPIME) IV  2 g Intravenous Q12H  . cholecalciferol  1,000 Units Oral Daily  . docusate sodium  100 mg Oral BID  . feeding supplement (ENSURE ENLIVE)  237 mL Oral TID WC  . guaiFENesin  600 mg Oral BID  . magnesium hydroxide  30 mL Oral BID  . mouth rinse  15 mL Mouth Rinse BID  . pantoprazole  40 mg Oral BID  . sodium chloride flush  3 mL Intravenous Q12H  . tiotropium  18 mcg Inhalation Daily  . vancomycin  750 mg Intravenous Q24H  . vitamin B-12  1,000 mcg Oral Daily  . Vitamin D (Ergocalciferol)  50,000 Units Oral Q7 days    Review of Systems - unable to obtain  OBJECTIVE: Temp:  [98.1 F (36.7 C)-99.2 F (37.3 C)] 98.1 F (36.7 C) (10/17 1405) Pulse Rate:  [83-122] 83 (10/17 1405) Resp:  [16-32] 16 (10/17 1405) BP: (99-131)/(59-89) 120/72 (10/17 1405) SpO2:  [94 %-99 %] 99 % (10/17 1405) Weight:  [42.7 kg (94 lb 1.9 oz)] 42.7 kg (94 lb 1.9 oz) (10/16 2046)  Physical Exam  Constitutional: ill appearing, frail, cachectic HENT: anicteric, pale Mouth/Throat: Oropharynx is clear and dry . No oropharyngeal exudate.  Cardiovascular: Normal rate, regular rhythm and normal heart sounds. Exam reveals no gallop and no friction rub.  No murmur heard.  Pulmonary/Chest: bil rhonchi, poor resp effort Abdominal: Soft. Bowel sounds are normal. He exhibits no distension. There is no tenderness.  Lymphadenopathy: He has no cervical adenopathy.  Neurological: lethargic Skin: Skin is warm and dry. No rash noted. No erythema.  Psychiatric:lethargic  LABS: Results for orders placed or performed during the hospital encounter of 04/25/16 (from the past 48 hour(s))  CBC with Differential/Platelet     Status: Abnormal   Collection Time: 04/25/16  4:32 PM  Result Value Ref Range   WBC 2.1 (L) 3.8 - 10.6 K/uL   RBC 3.58 (L) 4.40 - 5.90  MIL/uL   Hemoglobin 10.3 (L) 13.0 - 18.0 g/dL   HCT 30.3 (L) 40.0 - 52.0 %   MCV 84.6 80.0 - 100.0 fL   MCH 28.6 26.0 - 34.0 pg   MCHC 33.9 32.0 - 36.0 g/dL   RDW 17.3 (H) 11.5 - 14.5 %   Platelets 38 (L) 150 - 440 K/uL   Neutrophils Relative % 75 %   Lymphocytes Relative 5 %   Monocytes Relative 4 %   Eosinophils Relative 1 %   Basophils Relative 0 %   Band Neutrophils 13 %   Metamyelocytes Relative 2 %   Myelocytes 0 %   Promyelocytes Absolute 0 %   Blasts 0 %   nRBC 10 (H) 0 /100 WBC   Other 0 %   Neutro Abs 1.9 1.4 - 6.5 K/uL   Lymphs Abs 0.1 (L) 1.0 - 3.6 K/uL   Monocytes Absolute 0.1 (L) 0.2 - 1.0 K/uL   Eosinophils Absolute 0.0 0 - 0.7 K/uL   Basophils Absolute 0.0 0 - 0.1 K/uL   RBC Morphology MIXED RBC POPULATION    Smear Review LARGE PLATELETS PRESENT   Comprehensive metabolic panel     Status: Abnormal   Collection Time: 04/25/16  4:32 PM  Result Value Ref Range   Sodium 144 135 - 145 mmol/L   Potassium 4.3 3.5 - 5.1 mmol/L   Chloride 116 (H) 101 - 111 mmol/L   CO2 26 22 - 32 mmol/L   Glucose, Bld 89 65 - 99 mg/dL   BUN 51 (H) 6 - 20 mg/dL   Creatinine, Ser 0.72 0.61 - 1.24 mg/dL   Calcium 8.1 (L) 8.9 - 10.3 mg/dL   Total Protein 5.8 (L) 6.5 - 8.1 g/dL   Albumin 2.0 (L) 3.5 - 5.0 g/dL   AST 40 15 - 41 U/L   ALT 65 (H) 17 - 63 U/L   Alkaline Phosphatase 136 (H) 38 - 126 U/L   Total Bilirubin 0.9 0.3 - 1.2 mg/dL   GFR calc non Af Amer >60 >60 mL/min   GFR calc Af Amer >60 >60 mL/min    Comment: (NOTE) The eGFR has been calculated using the CKD EPI equation. This calculation has not been validated in all clinical situations. eGFR's persistently <60 mL/min signify possible Chronic Kidney Disease.    Anion gap 2 (L) 5 - 15  Lactic acid, plasma     Status: None   Collection Time: 04/25/16  4:32 PM  Result Value Ref Range   Lactic Acid, Venous 1.3 0.5 - 1.9 mmol/L  Blood culture (routine x 2)     Status: None (Preliminary result)  Collection Time:  04/25/16  4:32 PM  Result Value Ref Range   Specimen Description BLOOD    Special Requests NONE    Culture NO GROWTH < 24 HOURS    Report Status PENDING   Blood culture (routine x 2)     Status: None (Preliminary result)   Collection Time: 04/25/16  4:32 PM  Result Value Ref Range   Specimen Description BLOOD    Special Requests NONE    Culture NO GROWTH < 24 HOURS    Report Status PENDING   Magnesium     Status: None   Collection Time: 04/25/16  4:32 PM  Result Value Ref Range   Magnesium 2.2 1.7 - 2.4 mg/dL  Phosphorus     Status: None   Collection Time: 04/25/16  4:32 PM  Result Value Ref Range   Phosphorus 2.7 2.5 - 4.6 mg/dL  Brain natriuretic peptide     Status: Abnormal   Collection Time: 04/25/16  4:32 PM  Result Value Ref Range   B Natriuretic Peptide 121.0 (H) 0.0 - 100.0 pg/mL  Prepare Pheresed Platelets     Status: None   Collection Time: 04/25/16  6:34 PM  Result Value Ref Range   Unit Number [id number]    Blood Component Type PLTP LI1 PAS    Unit division 00    Status of Unit ISSUED,FINAL    Transfusion Status OK TO TRANSFUSE   Fibrinogen     Status: Abnormal   Collection Time: 04/25/16  7:00 PM  Result Value Ref Range   Fibrinogen 626 (H) 210 - 475 mg/dL  Protime-INR     Status: Abnormal   Collection Time: 04/25/16  7:00 PM  Result Value Ref Range   Prothrombin Time 18.5 (H) 11.4 - 15.2 seconds   INR 1.52   Urinalysis complete, with microscopic ([facility] only)     Status: Abnormal   Collection Time: 04/26/16 12:36 AM  Result Value Ref Range   Color, Urine YELLOW (A) YELLOW   APPearance CLEAR (A) CLEAR   Glucose, UA 50 (A) NEGATIVE mg/dL   Bilirubin Urine NEGATIVE NEGATIVE   Ketones, ur NEGATIVE NEGATIVE mg/dL   Specific Gravity, Urine 1.017 1.005 - 1.030   Hgb urine dipstick NEGATIVE NEGATIVE   pH 6.0 5.0 - 8.0   Protein, ur NEGATIVE NEGATIVE mg/dL   Nitrite NEGATIVE NEGATIVE   Leukocytes, UA NEGATIVE NEGATIVE   RBC / HPF 0-5 0 - 5 RBC/hpf   WBC,  UA 0-5 0 - 5 WBC/hpf   Bacteria, UA NONE SEEN NONE SEEN   Squamous Epithelial / LPF NONE SEEN NONE SEEN  Basic metabolic panel     Status: Abnormal   Collection Time: 04/26/16  5:00 AM  Result Value Ref Range   Sodium 148 (H) 135 - 145 mmol/L   Potassium 3.8 3.5 - 5.1 mmol/L   Chloride 122 (H) 101 - 111 mmol/L   CO2 21 (L) 22 - 32 mmol/L   Glucose, Bld 95 65 - 99 mg/dL   BUN 37 (H) 6 - 20 mg/dL   Creatinine, Ser 0.75 0.61 - 1.24 mg/dL   Calcium 7.5 (L) 8.9 - 10.3 mg/dL   GFR calc non Af Amer >60 >60 mL/min   GFR calc Af Amer >60 >60 mL/min    Comment: (NOTE) The eGFR has been calculated using the CKD EPI equation. This calculation has not been validated in all clinical situations. eGFR's persistently <60 mL/min signify possible Chronic Kidney Disease.    Anion gap  5 5 - 15  CBC     Status: Abnormal   Collection Time: 04/26/16  5:00 AM  Result Value Ref Range   WBC 1.8 (L) 3.8 - 10.6 K/uL   RBC 3.16 (L) 4.40 - 5.90 MIL/uL   Hemoglobin 8.9 (L) 13.0 - 18.0 g/dL   HCT 26.7 (L) 40.0 - 52.0 %   MCV 84.7 80.0 - 100.0 fL   MCH 28.3 26.0 - 34.0 pg   MCHC 33.4 32.0 - 36.0 g/dL   RDW 17.6 (H) 11.5 - 14.5 %   Platelets 61 (L) 150 - 440 K/uL  MRSA PCR Screening     Status: None   Collection Time: 04/26/16 11:35 AM  Result Value Ref Range   MRSA by PCR NEGATIVE NEGATIVE    Comment:        The GeneXpert MRSA Assay (FDA approved for NASAL specimens only), is one component of a comprehensive MRSA colonization surveillance program. It is not intended to diagnose MRSA infection nor to guide or monitor treatment for MRSA infections.    No components found for: ESR, C REACTIVE PROTEIN MICRO: Recent Results (from the past 720 hour(s))  Blood culture (routine x 2)     Status: None (Preliminary result)   Collection Time: 04/25/16  4:32 PM  Result Value Ref Range Status   Specimen Description BLOOD  Final   Special Requests NONE  Final   Culture NO GROWTH < 24 HOURS  Final   Report  Status PENDING  Incomplete  Blood culture (routine x 2)     Status: None (Preliminary result)   Collection Time: 04/25/16  4:32 PM  Result Value Ref Range Status   Specimen Description BLOOD  Final   Special Requests NONE  Final   Culture NO GROWTH < 24 HOURS  Final   Report Status PENDING  Incomplete  MRSA PCR Screening     Status: None   Collection Time: 04/26/16 11:35 AM  Result Value Ref Range Status   MRSA by PCR NEGATIVE NEGATIVE Final    Comment:        The GeneXpert MRSA Assay (FDA approved for NASAL specimens only), is one component of a comprehensive MRSA colonization surveillance program. It is not intended to diagnose MRSA infection nor to guide or monitor treatment for MRSA infections.     IMAGING: Dg Chest 2 View  Result Date: 04/25/2016 CLINICAL DATA:  Shortness of breath after chemo and radiation treatment earlier today. EXAM: CHEST  2 VIEW COMPARISON:  04/11/2016 FINDINGS: Two views study shows cardiomegaly. Interval development of interstitial and central airspace disease, right greater than left. Right-sided central line tip projects at the mid SVC level. Telemetry leads overlie the chest. IMPRESSION: Cardiomegaly with interstitial and asymmetric airspace disease, right greater than left, new in the interval. Asymmetric pulmonary edema or diffuse infection would be considerations. Electronically Signed   By: Misty Stanley M.D.   On: 04/25/2016 17:24   Ct Head Wo Contrast  Result Date: 04/25/2016 CLINICAL DATA:  History of multiple myeloma and altered mental status EXAM: CT HEAD WITHOUT CONTRAST TECHNIQUE: Contiguous axial images were obtained from the base of the skull through the vertex without intravenous contrast. COMPARISON:  None FINDINGS: Brain: No evidence of acute infarction, hemorrhage, hydrocephalus, extra-axial collection or mass lesion/mass effect. Mild atrophic changes are noted. Vascular: No hyperdense vessel or unexpected calcification. Skull:  Normal. Negative for fracture or focal lesion. Sinuses/Orbits: No acute finding. Other: None. IMPRESSION: Mild atrophic changes.  No acute abnormality  is noted. Electronically Signed   By: Inez Catalina M.D.   On: 04/25/2016 18:22   Nm Pet Image Initial (pi) Whole Body  Result Date: 03/29/2016 CLINICAL DATA:  Initial treatment strategy for multiple myeloma. PET-CT was attempted on 03/22/2016, however the PET portion of the study could not be completed due to patient discomfort. Status post radiation therapy and chemotherapy 1 day prior with steroids. EXAM: NUCLEAR MEDICINE PET WHOLE BODY TECHNIQUE: 12.8 mCi F-18 FDG was injected intravenously. Full-ring PET imaging was performed from the vertex to the feet after the radiotracer. CT data was obtained and used for attenuation correction and anatomic localization. FASTING BLOOD GLUCOSE:  Value:  119 mg/dl COMPARISON:  03/22/2016 incomplete PET-CT and 03/15/2016 chest CT. FINDINGS: Head/Neck: No hypermetabolic lymph nodes in the neck. Chest: No hypermetabolic axillary, mediastinal or hilar nodes. Top-normal heart size. Atherosclerotic nonaneurysmal thoracic aorta. No pleural effusions. There are 4 mm pulmonary nodules in the anterior right upper lobe (series 3/image 96) and right middle lobe (series 3/image 147), below PET resolution. No acute consolidative airspace disease. Abdomen/Pelvis: No abnormal hypermetabolic activity within the liver, pancreas, adrenal glands, or spleen. No hypermetabolic lymph nodes in the abdomen or pelvis. There are 2 simple right liver cysts, largest 1.4 cm. Atherosclerotic nonaneurysmal abdominal aorta. Skeleton: There are numerous lytic bone lesions with associated mild hypermetabolism throughout the spine, ribs and pelvis, several of which are expansile liver extraosseous soft tissue components. For example an expansile 5.1 x 3.1 cm posterior left ninth rib lytic bone lesion with max SUV 3.1 (series 3/ image 130), a right T10  expansile 4.3 x 3.3 cm lytic bone lesion with max SUV 2.9 and associated pathologic T10 compression fracture, and a right upper sacral 2.7 x 2.0 cm mildly expansile lytic bone lesion (series 3/ image 217) with adjacent pathologic vertical right sacral ala fracture with max SUV 3.0. Extremities: No hypermetabolic activity to suggest metastasis. FDG uptake in the left antecubital fossa/left forearm is injection related. IMPRESSION: 1. Mildly hypermetabolic lytic bone lesions throughout the axial skeleton, several of which are expansile with extra-osseous soft tissue components, consistent with multiple myeloma. Pathologic fractures in the T10 vertebral body and right sacral ala. 2. Two 4 mm right pulmonary nodules, below PET resolution. A follow-up unenhanced chest CT could be obtained in 3-6 months. 3. Aortic atherosclerosis. Electronically Signed   By: Ilona Sorrel M.D.   On: 03/29/2016 10:25   Dg Chest Port 1 View  Result Date: 04/11/2016 CLINICAL DATA:  Status post PICC placement. EXAM: PORTABLE CHEST 1 VIEW COMPARISON:  04/06/2016. FINDINGS: Mildly enlarged cardiac silhouette with an interval mild decrease in size. Right PICC tip in the region of the upper right atrium. Small amount of linear density in the left lower lobe. Previously noted T 90 degrees 10 vertebral compression fracture. Possible additional vertebral compression fractures, difficult to assess in the frontal projection. Diffuse osteopenia. Mild scoliosis. IMPRESSION: 1. Right PICC tip in the upper right atrium. This could be retracted approximately 6 mm to place it at the superior cavoatrial junction. 2. Small amount of linear atelectasis and probable scarring in the left lower lobe. 3. Mild cardiomegaly with improvement. 4. T10 and possible additional thoracic vertebral compression deformities. Electronically Signed   By: Claudie Revering M.D.   On: 04/11/2016 15:49   Dg Chest Portable 1 View  Result Date: 04/06/2016 CLINICAL DATA:  Cough,  dyspnea. History of multiple myeloma, T10 pathologic fracture. EXAM: PORTABLE CHEST 1 VIEW COMPARISON:  PET-CT March 29, 2016 FINDINGS:  Cardiac silhouette is moderately enlarged, mediastinal silhouette is nonsuspicious, mildly calcified aortic knob. Increased lung volumes. No pleural effusion or focal consolidation. No pneumothorax. Apical pleural thickening. Osteopenia. Cachexia. Bold severe T10 compression fracture. IMPRESSION: Moderate cardiomegaly.  Increased lung volumes. Osteopenia with severe T10 fracture corresponding known pathologic fracture. Electronically Signed   By: Elon Alas M.D.   On: 04/06/2016 03:07   Dg Chest Port 1v Same Day  Result Date: 04/11/2016 CLINICAL DATA:  PICC line adjustment EXAM: PORTABLE CHEST 1 VIEW COMPARISON:  Portable exam 1636 hours compared to 1527 hours FINDINGS: Tip of RIGHT arm PICC line now projects over SVC above cavoatrial junction. Enlargement of cardiac silhouette. Atherosclerotic calcification aorta. Pulmonary vascularity normal. Lungs appear unchanged, with atelectasis versus scarring at LEFT base and question underlying emphysematous changes. No pleural effusion or pneumothorax. Bones demineralized. IMPRESSION: Tip of RIGHT arm PICC line now projects over SVC. Enlargement of cardiac silhouette with suspected emphysematous changes and LEFT lower lobe atelectasis versus scarring. Electronically Signed   By: Lavonia Dana M.D.   On: 04/11/2016 16:54    Assessment:   Francisco [name] is a 70 y.o. male with new dx of myeloma with diffuse bony lesions, on chemo and XRT admitted with progressive weakness, sob, pancytopenia. He has hx TB in 1985 reportedly treated in 1985 in [place].  His CT done a month ago shows chronic scarring or upper lobes, but CXR now shows some progressive airspace disease.  Interestingly he also has some hoarseness.  I think TB is unlikely but possible in this immunocompromised patient and I think he should be worked up for it  and isolated.   Recommendations Transfer to airborne Induce 3 sputum for culture and one for MTB PCR Check CT chest without contrast Discussed with patient, his daughter in room and his son on phone. Thank you very much for allowing me to participate in the care of this patient. Please call with questions.   Cheral Marker. Ola Spurr, MD

## 2016-04-26 NOTE — Telephone Encounter (Signed)
Spoke to pt's daughter re: plan of care. Also spoke to Dr.Modi re: ID consultation.

## 2016-04-26 NOTE — Progress Notes (Signed)
Pharmacy Antibiotic Note  Francisco [name] is a 70 y.o. male admitted on 04/25/2016 with sepsis.  Pharmacy has been consulted for vancomycin and cefepime dosing.  This is day #2 of antibiotics. Cefepime discontinued and piperacillin/tazobactam consult entered this evening. Patient to continue on vancomycin.  Plan: Piperacillin/tazobactam 3.375 g IV q8h EI per aspiration pneumonia  Vancomycin 1000 mg given in ED Order vancomycin 750 mg IV q24h (15 hour stacked dose) Goal vancomycin trough 15-20 mcg/mL Vancomycin trough ordered for 10/19 @ 0930  Kinetics: Dosed on actual body weight = 43 kg Ke: 0.047 Half-life: 15 hrs Vd: 30 L  Cmin = 15 mcg/mL  Temp (24hrs), Avg:98.8 F (37.1 C), Min:98.1 F (36.7 C), Max:99.2 F (37.3 C)   Recent Labs Lab 04/25/16 1632 04/26/16 0500  WBC 2.1* 1.8*  CREATININE 0.72 0.75  LATICACIDVEN 1.3  --     Estimated Creatinine Clearance: 51.2 mL/min (by C-G formula based on SCr of 0.75 mg/dL).    No Known Allergies  Antimicrobials this admission: vancomycin 10/16 >>  cefepime 10/16 >> 10/17 Piperacillin/tazobactam 10/17 >>  Dose adjustments this admission:  Microbiology results: 10/16 BCx: Sent  Thank you for allowing pharmacy to be a part of this patient's care.  Lenis Noon, PharmD, BCPS Clinical Pharmacist 04/26/2016 8:34 PM

## 2016-04-27 ENCOUNTER — Inpatient Hospital Stay: Payer: Medicare Other

## 2016-04-27 ENCOUNTER — Ambulatory Visit: Payer: Medicare Other

## 2016-04-27 ENCOUNTER — Inpatient Hospital Stay: Payer: Medicare Other | Admitting: Internal Medicine

## 2016-04-27 DIAGNOSIS — J189 Pneumonia, unspecified organism: Secondary | ICD-10-CM

## 2016-04-27 DIAGNOSIS — C9000 Multiple myeloma not having achieved remission: Secondary | ICD-10-CM | POA: Diagnosis not present

## 2016-04-27 DIAGNOSIS — R4182 Altered mental status, unspecified: Secondary | ICD-10-CM | POA: Diagnosis not present

## 2016-04-27 DIAGNOSIS — R1310 Dysphagia, unspecified: Secondary | ICD-10-CM

## 2016-04-27 DIAGNOSIS — A419 Sepsis, unspecified organism: Secondary | ICD-10-CM | POA: Diagnosis not present

## 2016-04-27 DIAGNOSIS — D6481 Anemia due to antineoplastic chemotherapy: Secondary | ICD-10-CM | POA: Diagnosis not present

## 2016-04-27 DIAGNOSIS — R630 Anorexia: Secondary | ICD-10-CM

## 2016-04-27 DIAGNOSIS — R0602 Shortness of breath: Secondary | ICD-10-CM | POA: Diagnosis not present

## 2016-04-27 DIAGNOSIS — D701 Agranulocytosis secondary to cancer chemotherapy: Secondary | ICD-10-CM | POA: Diagnosis not present

## 2016-04-27 LAB — MAGNESIUM: Magnesium: 2.6 mg/dL — ABNORMAL HIGH (ref 1.7–2.4)

## 2016-04-27 LAB — CBC
HCT: 25.8 % — ABNORMAL LOW (ref 40.0–52.0)
HEMOGLOBIN: 8.8 g/dL — AB (ref 13.0–18.0)
MCH: 29 pg (ref 26.0–34.0)
MCHC: 34 g/dL (ref 32.0–36.0)
MCV: 85.3 fL (ref 80.0–100.0)
Platelets: 33 10*3/uL — ABNORMAL LOW (ref 150–440)
RBC: 3.03 MIL/uL — AB (ref 4.40–5.90)
RDW: 18.4 % — ABNORMAL HIGH (ref 11.5–14.5)
WBC: 2.9 10*3/uL — ABNORMAL LOW (ref 3.8–10.6)

## 2016-04-27 LAB — PHOSPHORUS: PHOSPHORUS: 2.2 mg/dL — AB (ref 2.5–4.6)

## 2016-04-27 LAB — GLUCOSE, CAPILLARY
GLUCOSE-CAPILLARY: 98 mg/dL (ref 65–99)
GLUCOSE-CAPILLARY: 159 mg/dL — AB (ref 65–99)
Glucose-Capillary: 102 mg/dL — ABNORMAL HIGH (ref 65–99)
Glucose-Capillary: 117 mg/dL — ABNORMAL HIGH (ref 65–99)

## 2016-04-27 LAB — PROLACTIN: Prolactin: 7.7 ng/mL (ref 4.0–15.2)

## 2016-04-27 LAB — BASIC METABOLIC PANEL
Anion gap: 4 — ABNORMAL LOW (ref 5–15)
BUN: 34 mg/dL — ABNORMAL HIGH (ref 6–20)
CO2: 23 mmol/L (ref 22–32)
Calcium: 7.6 mg/dL — ABNORMAL LOW (ref 8.9–10.3)
Chloride: 126 mmol/L — ABNORMAL HIGH (ref 101–111)
Creatinine, Ser: 0.82 mg/dL (ref 0.61–1.24)
GFR calc Af Amer: >60 mL/min (ref >60)
GFR calc non Af Amer: >60 mL/min (ref >60)
Glucose, Bld: 105 mg/dL — ABNORMAL HIGH (ref 65–99)
Potassium: 3.9 mmol/L (ref 3.5–5.1)
Sodium: 153 mmol/L — ABNORMAL HIGH (ref 135–145)

## 2016-04-27 LAB — TRIGLYCERIDES: TRIGLYCERIDES: 125 mg/dL (ref <150)

## 2016-04-27 LAB — ALBUMIN: Albumin: 1.9 g/dL — ABNORMAL LOW (ref 3.5–5.0)

## 2016-04-27 MED ORDER — MORPHINE SULFATE (PF) 2 MG/ML IV SOLN
1.0000 mg | INTRAVENOUS | Status: DC | PRN
  Administered 2016-04-27 – 2016-04-30 (×9): 1 mg via INTRAVENOUS
  Filled 2016-04-27 (×10): qty 1

## 2016-04-27 MED ORDER — FAMOTIDINE IN NACL 20-0.9 MG/50ML-% IV SOLN
20.0000 mg | Freq: Two times a day (BID) | INTRAVENOUS | Status: DC
  Administered 2016-04-27 – 2016-04-28 (×2): 20 mg via INTRAVENOUS
  Filled 2016-04-27 (×4): qty 50

## 2016-04-27 MED ORDER — POTASSIUM PHOSPHATES 15 MMOLE/5ML IV SOLN
20.0000 mmol | Freq: Once | INTRAVENOUS | Status: AC
  Administered 2016-04-27: 20 mmol via INTRAVENOUS
  Filled 2016-04-27: qty 6.67

## 2016-04-27 MED ORDER — TRACE MINERALS CR-CU-MN-SE-ZN 10-1000-500-60 MCG/ML IV SOLN
INTRAVENOUS | Status: AC
  Administered 2016-04-27: 19:00:00 via INTRAVENOUS
  Filled 2016-04-27: qty 720

## 2016-04-27 MED ORDER — DEXTROSE 5 % IV SOLN
500.0000 mg | INTRAVENOUS | Status: DC
  Administered 2016-04-27 – 2016-04-28 (×2): 500 mg via INTRAVENOUS
  Filled 2016-04-27 (×4): qty 500

## 2016-04-27 MED ORDER — INSULIN ASPART 100 UNIT/ML ~~LOC~~ SOLN
0.0000 [IU] | SUBCUTANEOUS | Status: DC
  Administered 2016-04-28: 1 [IU] via SUBCUTANEOUS
  Administered 2016-04-29: 22:00:00 2 [IU] via SUBCUTANEOUS
  Administered 2016-04-29 – 2016-04-30 (×3): 1 [IU] via SUBCUTANEOUS
  Administered 2016-04-30 (×2): 2 [IU] via SUBCUTANEOUS
  Administered 2016-05-01 (×3): 1 [IU] via SUBCUTANEOUS
  Administered 2016-05-01 (×2): 2 [IU] via SUBCUTANEOUS
  Administered 2016-05-01 – 2016-05-02 (×2): 1 [IU] via SUBCUTANEOUS
  Administered 2016-05-02 (×2): 2 [IU] via SUBCUTANEOUS
  Administered 2016-05-02 – 2016-05-03 (×3): 1 [IU] via SUBCUTANEOUS
  Filled 2016-04-27 (×2): qty 2
  Filled 2016-04-27 (×2): qty 1
  Filled 2016-04-27 (×2): qty 2
  Filled 2016-04-27 (×6): qty 1
  Filled 2016-04-27 (×2): qty 2
  Filled 2016-04-27 (×3): qty 1
  Filled 2016-04-27: qty 2
  Filled 2016-04-27 (×2): qty 1

## 2016-04-27 MED ORDER — DEXTROSE 5 % IV SOLN: 250.0000 mg | INTRAVENOUS | Status: DC

## 2016-04-27 NOTE — Consult Note (Signed)
PHARMACY - ADULT TOTAL PARENTERAL NUTRITION CONSULT NOTE   Pharmacy Consult for TPN Indication:Severe malnutrition in context of chronic illness  Patient Measurements: Height: 5\' 10"  (177.8 cm) Weight: 94 lb 1.9 oz (42.7 kg) IBW/kg (Calculated) : 73 TPN AdjBW (KG): 42.7 Body mass index is 13.5 kg/m. Usual Weight:   Assessment:   GI: not a candidate for tube placement, at least at this time Endo: Insulin requirements in the past 24 hours:0 units Lytes:k=3.9, Mg=2.6, phos=2.2 Renal: Pulm: possible aspiration PNA on azithro, zosyn, vanc Cards: Hepatobil: Neuro: ID: possible aspiration PNA on azithro, zosyn, vanc  Best Practices: TPN Access:PICC TPN start date: started at previous admission this month, restarted 10/18  Nutritional Goals (per RD recommendation on 10/18): 1590 kcal, 66 grams protein, 1320 ml fluid with total calories from lipids averaged over 7 days   Current Nutrition:   Plan:  Sart Clinimix 5/20 at 17ml/hr- goal rate 69ml/hr 550ml WMF 20% lipid emulsion at 62ml/hr x 10hr Continue to monitor K, phos, Mg closley as pt is at risk for refeeding.  Will give Kphos IV 50mmol once due to low phos. Recheck all electrolytes in the AM. Strict I/O, daily weights, CBG q 4hr checks with sensitive SSI ordered.   Ramond Dial, Pharm.D Clinical Pharmacist  04/27/2016,2:54 PM

## 2016-04-27 NOTE — Plan of Care (Signed)
Problem: Health Behavior/Discharge Planning: Goal: Ability to manage health-related needs will improve Outcome: Not Applicable Date Met: 40/98/28 Pt with progressive disease.  Problem: Pain Managment: Goal: General experience of comfort will improve Outcome: Progressing Pt requires oral pain medication ATC to achieve acceptable pain control  Problem: Physical Regulation: Goal: Ability to maintain clinical measurements within normal limits will improve Outcome: Not Applicable Date Met: 67/51/98 Progressive disease  Problem: Nutrition: Goal: Adequate nutrition will be maintained Outcome: Not Progressing Pt tolerating bites and sips

## 2016-04-27 NOTE — Care Management (Signed)
Admitted to [facility] with the diagnosis of pancytopenia. Discharged from this facility 04/13/16. Lives with family. Friend is [place] [phone]). Last admission received hospital bed and wheelchair. Followed by [facility] for TPN when discharged to home last admission.  Transferred to room 128. History of TB in 1985.  WBC=2.9 today. Platelets 33, IV Zosyn and Vancomycin continues.  Palliative Care consult in progress. Poor prognosis.  Shelbie Ammons RN MSN CCM Care Management [phone]

## 2016-04-27 NOTE — Progress Notes (Signed)
Francisco [name]   DOB:1945/09/06   GG#:[id number]    Subjective: Patient resting comfortably. Close. Easily arousable. Continues to couple of poor by mouth intake. Difficult to swallow.  Objective:  Vitals:   04/26/16 2334 04/27/16 1338  BP: 121/73 126/82  Pulse: 88 100  Resp: 20 20  Temp: 97.7 F (36.5 C) 97.8 F (36.6 C)     Intake/Output Summary (Last 24 hours) at 04/27/16 1739 Last data filed at 04/27/16 1330  Gross per 24 hour  Intake           1032.5 ml  Output              450 ml  Net            582.5 ml    GENERAL Alert, no distress and comfortable. O2 oxygen. Cachectic appearing. Accompanied by multiple family members EYES: no pallor or icterus OROPHARYNX: no thrush or ulceration. NECK: supple, no masses felt LYMPH:  no palpable lymphadenopathy in the cervical, axillary or inguinal regions LUNGS: decreased breath sounds to auscultation at bases and  No wheeze or crackles HEART/CVS: regular rate & rhythm and no murmurs; No lower extremity edema ABDOMEN: abdomen soft, tender  on deep palpation. and normal bowel sounds Musculoskeletal:no cyanosis of digits and no clubbing  PSYCH: alert & oriented x 3 with fluent speech NEURO: no focal motor/sensory deficits SKIN:  no rashes or significant lesions   Labs:  Lab Results  Component Value Date   WBC 2.9 (L) 04/27/2016   HGB 8.8 (L) 04/27/2016   HCT 25.8 (L) 04/27/2016   MCV 85.3 04/27/2016   PLT 33 (L) 04/27/2016   NEUTROABS 1.9 04/25/2016    Lab Results  Component Value Date   NA 153 (H) 04/27/2016   K 3.9 04/27/2016   CL 126 (H) 04/27/2016   CO2 23 04/27/2016    Studies:  Ct Head Wo Contrast  Result Date: 04/25/2016 CLINICAL DATA:  History of multiple myeloma and altered mental status EXAM: CT HEAD WITHOUT CONTRAST TECHNIQUE: Contiguous axial images were obtained from the base of the skull through the vertex without intravenous contrast. COMPARISON:  None FINDINGS: Brain: No evidence of acute  infarction, hemorrhage, hydrocephalus, extra-axial collection or mass lesion/mass effect. Mild atrophic changes are noted. Vascular: No hyperdense vessel or unexpected calcification. Skull: Normal. Negative for fracture or focal lesion. Sinuses/Orbits: No acute finding. Other: None. IMPRESSION: Mild atrophic changes.  No acute abnormality is noted. Electronically Signed   By: Inez Catalina M.D.   On: 04/25/2016 18:22   Ct Chest Wo Contrast  Result Date: 04/27/2016 CLINICAL DATA:  New diagnosis of multiple myeloma with bony lesions, on chemotherapy and radiation therapy. Admitted with progressive weakness, shortness of breath, pancytopenia, history TB. EXAM: CT ANGIOGRAPHY CHEST WITH CONTRAST TECHNIQUE: Multidetector CT imaging of the chest was performed using the standard protocol during bolus administration of intravenous contrast. Multiplanar CT image reconstructions and MIPs were obtained to evaluate the vascular anatomy. Additional precontrast images were obtained. CONTRAST:  75 cc Isovue 370 IV. COMPARISON:  PET-CT 03/28/2016, chest radiograph 04/25/2016 FINDINGS: Cardiovascular: Atherosclerotic calcifications aorta without aneurysm or dissection. Pulmonary arteries well opacified and patent. No evidence of pulmonary embolism. No pericardial effusion. Mediastinum/Nodes: Esophagus dilated by air and fluid. No definite thoracic adenopathy. Base of cervical region unremarkable. Lungs/Pleura: Lungs appear emphysematous. Slightly nodular peribronchovascular infiltrates in both lower lobes with dense consolidation of a large portion of LEFT lower lobe as well. Minimal LEFT upper lobe infiltrate as well. Scattered areas of  subpleural thickening and scarring. No definite pleural effusion or pneumothorax. Upper Abdomen: Single low-attenuation lesion centrally in liver 13 x 10 mm diameter image 78, question cyst. Remaining visualized upper abdomen unremarkable. Musculoskeletal: Expansile destructive lesion of the  posterior LEFT ninth rib. Diffuse osseous demineralization. Additional scattered areas of cortical thinning and destruction in BILATERAL ribs. Lytic lesion at T3 vertebral body. Compression fractures at T4, T9, and T10, suspected to be pathologic. Review of the MIP images confirms the above findings. IMPRESSION: No evidence of pulmonary embolism. Aortic atherosclerosis. BILATERAL lower lobe infiltrates LEFT greater than RIGHT. Scattered bone lesions with thoracic spine compression fractures at T4, T9, and T10. Electronically Signed   By: Lavonia Dana M.D.   On: 04/26/2016 18:06   Ct Angio Chest Pe W Or Wo Contrast  Result Date: 04/27/2016 CLINICAL DATA:  New diagnosis of multiple myeloma with bony lesions, on chemotherapy and radiation therapy. Admitted with progressive weakness, shortness of breath, pancytopenia, history TB. EXAM: CT ANGIOGRAPHY CHEST WITH CONTRAST TECHNIQUE: Multidetector CT imaging of the chest was performed using the standard protocol during bolus administration of intravenous contrast. Multiplanar CT image reconstructions and MIPs were obtained to evaluate the vascular anatomy. Additional precontrast images were obtained. CONTRAST:  75 cc Isovue 370 IV. COMPARISON:  PET-CT 03/28/2016, chest radiograph 04/25/2016 FINDINGS: Cardiovascular: Atherosclerotic calcifications aorta without aneurysm or dissection. Pulmonary arteries well opacified and patent. No evidence of pulmonary embolism. No pericardial effusion. Mediastinum/Nodes: Esophagus dilated by air and fluid. No definite thoracic adenopathy. Base of cervical region unremarkable. Lungs/Pleura: Lungs appear emphysematous. Slightly nodular peribronchovascular infiltrates in both lower lobes with dense consolidation of a large portion of LEFT lower lobe as well. Minimal LEFT upper lobe infiltrate as well. Scattered areas of subpleural thickening and scarring. No definite pleural effusion or pneumothorax. Upper Abdomen: Single  low-attenuation lesion centrally in liver 13 x 10 mm diameter image 78, question cyst. Remaining visualized upper abdomen unremarkable. Musculoskeletal: Expansile destructive lesion of the posterior LEFT ninth rib. Diffuse osseous demineralization. Additional scattered areas of cortical thinning and destruction in BILATERAL ribs. Lytic lesion at T3 vertebral body. Compression fractures at T4, T9, and T10, suspected to be pathologic. Review of the MIP images confirms the above findings. IMPRESSION: No evidence of pulmonary embolism. Aortic atherosclerosis. BILATERAL lower lobe infiltrates LEFT greater than RIGHT. Scattered bone lesions with thoracic spine compression fractures at T4, T9, and T10. Electronically Signed   By: Lavonia Dana M.D.   On: 04/26/2016 18:06    Assessment & Plan:   # 70 year old male patient history of multiple myeloma currently admitted to hospital for poor by mouth intake/increasing shortness of breath/altered mental status  # Multiple myeloma status post cycle # cycle #2 day 8-of Velcade/Revlimid 20 mg once a day for one week. Treatment can done on hold because of acute respiratory issues. Check M protein; kappa lambda light chain ratio.  # Poor by mouth intake/esophageal dilatation noted on the CT scan- awaiting GI evaluation. Continue TPN for now.  # Pneumonia/possibly aspiration- currently on antibiotics as per ID.   # Above plan discussed with the patient and family. Also discussed with Dr. Donella Stade hold off radiation at this time. Also discussed with Dr.Vachhani.   Cammie Sickle, MD 04/27/2016  5:39 PM

## 2016-04-27 NOTE — Plan of Care (Signed)
Problem: Pain Managment: Goal: General experience of comfort will improve Outcome: Progressing Morphine given once for generalized pain with improvement.  Problem: Nutrition: Goal: Adequate nutrition will be maintained Outcome: Not Progressing Poor intake. GI consult pending. TPN initiated.

## 2016-04-27 NOTE — Consult Note (Signed)
GI Inpatient Consult Note  Reason for Consult: PEG tube placement   Attending Requesting Consult: Dr. Sherrine Maples  History of Present Illness: Francisco [name] is a 70 y.o. male with a known history TB s/p treatment in [place] (approx 1985), multiple myeloma with diffuse bone metastases (dx 03/2016, receiving chemo and XRT) admitted for possible pneumonia, AMS, and anorexia. Of note, patient was admitted to [facility] from 9/26-10/4/17 for intractable nausea/vomiting and anorexia.  Symptoms were attributed to acute gastritis due to chemotherapy, but patient failed to improve with IV Protonix and IV antiemetics.  GI consultation was provided by Dr. Truman Hayward, and PEG tube was recommended.  However, patient's family did not wish to proceed at that time. He was discharged with TPN.  Since discharge, patient is unable to eat more than 2-3 spoonfuls of soup.  He receives medication mixed in with a spoonful of sprite.  Patient's daughters denied changes in swallowing, but felt patient was unable to tolerate and/or refusing food/liquid.    On the morning of 04/25/16, patient presented to the [facility] ED for evaluation of AMS, somnolence, SOB, productive cough, "shakes" x 3 days.  Diarrhea also noted x 2 days.  Labs notable for pancoytopenia, with WBCs 1.8, Hgb 8.9, and platelets 61.  Blood cultures negative x 2, but sputum cultures remain pending. CXR demonstrated progressive airspace disease, which is a change from CXR 1 month ago which only noted chronic scarring of upper lobs.  CTA chest was negative for PE; however, it revealed esophageal dilatation by air and fluid, as well as bilateral lower lobe infiltrates (L>R).  Patient was also started on PPN due to significantly decreased appetite and oral intake.  A GI consultation was placed to evaluate for PEG tube insertion.   Of note, labs drawn this morning demonstrated stable Hgb (8.8) and WBCs (2.9), but platelets have decreased to 33.  Past Medical History:  Past  Medical History:  Diagnosis Date  . Anemia   . Cancer (HCC)    Bone metastasis  . Constipation   . Hearing loss   . Hoarse voice quality   . Liver lesion   . Malaria 2003  . Multiple myeloma ([place]) 03/22/2016   Per Dr. Rogue Bussing on his PET order.  . Tuberculosis 1985    Problem List: Patient Active Problem List   Diagnosis Date Noted  . Protein-calorie malnutrition, severe 04/26/2016  . Pancytopenia ([place]) 04/25/2016  . Metabolic encephalopathy 89/38/1017  . Healthcare-associated pneumonia 04/25/2016  . Elevated transaminase level 04/25/2016  . SIRS (systemic inflammatory response syndrome) ([place]) 04/25/2016  . Anorexia   . Abnormal loss of weight   . Intractable vomiting with nausea 04/06/2016  . Dehydration 04/05/2016  . Multiple myeloma in relapse ([place]) 03/16/2016  . Pernicious anemia 03/16/2016    Past Surgical History: Past Surgical History:  Procedure Laterality Date  . RETINAL DETACHMENT SURGERY  2006    Allergies: No Known Allergies  Home Medications: Facility-Administered Medications Prior to Admission  Medication Dose Route Frequency Provider Last Rate Last Dose  . ondansetron (ZOFRAN) 8 mg in sodium chloride 0.9 % 50 mL IVPB  8 mg Intravenous Once Cammie Sickle, MD      . ondansetron (ZOFRAN) 8 mg in sodium chloride 0.9 % 50 mL IVPB  8 mg Intravenous Once Cammie Sickle, MD      . ondansetron (ZOFRAN-ODT) disintegrating tablet 8 mg  8 mg Oral Q8H PRN Cammie Sickle, MD       Prescriptions Prior to Admission  Medication  Sig Dispense Refill Last Dose  . acyclovir (ZOVIRAX) 400 MG tablet Take 1 tablet (400 mg total) by mouth daily. 30 tablet 6   . alum & mag hydroxide-simeth (MAALOX/MYLANTA) 200-200-20 MG/5ML suspension Take 30 mLs by mouth every 4 (four) hours as needed for indigestion or heartburn. (Patient not taking: Reported on 04/19/2016) 355 mL 0 Not Taking  . cholecalciferol (VITAMIN D) 1000 units tablet Take 1,000 Units by mouth daily.    Not Taking  . dronabinol (MARINOL) 2.5 MG capsule Take 1 capsule (2.5 mg total) by mouth 2 (two) times daily before lunch and supper. (Patient not taking: Reported on 04/19/2016) 30 capsule 0 Not Taking  . esomeprazole (NEXIUM) 40 MG capsule Take 1 capsule (40 mg total) by mouth 2 (two) times daily before a meal. 60 capsule 6   . fentaNYL (DURAGESIC - DOSED MCG/HR) 12 MCG/HR Place 1 patch (12.5 mcg total) onto the skin every 3 (three) days. (Patient not taking: Reported on 04/19/2016) 7 patch 0 Not Taking  . HYDROcodone-acetaminophen (NORCO/VICODIN) 5-325 MG tablet Take 1 tablet by mouth every 4 (four) hours as needed for moderate pain. 60 tablet 0 Taking  . lactulose (CHRONULAC) 10 GM/15ML solution Take 30 mLs (20 g total) by mouth 2 (two) times daily as needed for mild constipation, moderate constipation or severe constipation. (Patient not taking: Reported on 04/19/2016) 240 mL 0 Not Taking  . magnesium hydroxide (MILK OF MAGNESIA) 400 MG/5ML suspension Take 5 mLs by mouth daily as needed for mild constipation.   Taking  . ondansetron (ZOFRAN ODT) 8 MG disintegrating tablet Take 1 tablet (8 mg total) by mouth every 8 (eight) hours as needed for nausea or vomiting. 20 tablet 3   . ondansetron (ZOFRAN) 8 MG tablet Take 1 tablet (8 mg total) by mouth every 8 (eight) hours as needed for nausea or vomiting. 30 tablet 3 Taking  . phosphorus (K-PHOS-NEUTRAL) 155-852-130 MG tablet Take 1 tablet (250 mg total) by mouth 2 (two) times daily. (Patient not taking: Reported on 04/19/2016) 60 tablet 0 Not Taking  . prochlorperazine (COMPAZINE) 10 MG tablet Take 1 tablet (10 mg total) by mouth every 6 (six) hours as needed for nausea or vomiting. (Patient not taking: Reported on 04/19/2016) 30 tablet 3 Not Taking  . sertraline (ZOLOFT) 25 MG tablet Take 1 tablet (25 mg total) by mouth daily. (Patient not taking: Reported on 04/19/2016) 30 tablet 0 Not Taking  . sucralfate (CARAFATE) 1 g tablet Take 1 tablet (1 g  total) by mouth 4 (four) times daily -  with meals and at bedtime. (Patient not taking: Reported on 04/19/2016) 30 tablet 1 Not Taking  . thiamine (VITAMIN B-1) 100 MG tablet Take 1 tablet (100 mg total) by mouth daily. (Patient not taking: Reported on 04/19/2016) 30 tablet 0 Not Taking  . vitamin B-12 (CYANOCOBALAMIN) 1000 MCG tablet Take 1,000 mcg by mouth daily.   Not Taking  . Vitamin D, Ergocalciferol, (DRISDOL) 50000 units CAPS capsule Take 50,000 Units by mouth every 7 (seven) days.   Not Taking   Home medication reconciliation was completed with the patient.   Scheduled Inpatient Medications:   . acyclovir  400 mg Oral Daily  . albuterol  2.5 mg Nebulization TID  . azithromycin  250 mg Oral q1800  . cholecalciferol  1,000 Units Oral Daily  . docusate sodium  100 mg Oral BID  . feeding supplement (ENSURE ENLIVE)  237 mL Oral TID WC  . guaiFENesin  600 mg Oral BID  .  magnesium hydroxide  30 mL Oral BID  . mouth rinse  15 mL Mouth Rinse BID  . pantoprazole  40 mg Oral BID  . piperacillin-tazobactam (ZOSYN)  IV  3.375 g Intravenous Q8H  . sodium chloride flush  3 mL Intravenous Q12H  . tiotropium  18 mcg Inhalation Daily  . vancomycin  750 mg Intravenous Q24H  . vitamin B-12  1,000 mcg Oral Daily  . Vitamin D (Ergocalciferol)  50,000 Units Oral Q7 days    Continuous Inpatient Infusions:   . sodium chloride 50 mL/hr at 04/26/16 2336    PRN Inpatient Medications:  acetaminophen **OR** acetaminophen, HYDROcodone-acetaminophen, magnesium hydroxide, ondansetron **OR** ondansetron (ZOFRAN) IV  Family History: family history is not on file.    Social History:   reports that he has never smoked. He has never used smokeless tobacco. He reports that he does not drink alcohol or use drugs.   Review of Systems: Constitutional: + weight loss Eyes: No changes in vision. ENT: No oral lesions, sore throat. + hoarseness GI: see HPI.  Heme/Lymph: No easy bruising.  CV: No chest pain.   GU: No hematuria.  Integumentary: No rashes.  Neuro: No headaches.  Psych: No depression/anxiety.  Endocrine: No heat/cold intolerance.  Allergic/Immunologic: No urticaria.  Resp: No cough, SOB.  Musculoskeletal: No joint swelling. + bony pain   Physical Examination: BP 121/73 (BP Location: Left Arm)   Pulse 88   Temp 97.7 F (36.5 C) (Oral)   Resp 20   Ht 5' 10"  (1.778 m)   Wt 42.7 kg (94 lb 1.9 oz)   SpO2 98%   BMI 13.50 kg/m  Gen: NAD, alert and oriented x 4, appears thin, cachectic, and fatigued HEENT: PEERLA, EOMI, Neck: supple, no JVD or thyromegaly Chest: Bilateral rhonchi, poor respiratory effort and air movement bilaterally, no wheezes or crackles CV: RRR, no m/g/c/r Abd: soft, NT, ND, +BS in all four quadrants; no HSM, guarding, ridigity, or rebound tenderness Ext: no edema, well perfused with 2+ pulses Skin: no rash or lesions noted Lymph: no LAD  Data: Lab Results  Component Value Date   WBC 2.9 (L) 04/27/2016   HGB 8.8 (L) 04/27/2016   HCT 25.8 (L) 04/27/2016   MCV 85.3 04/27/2016   PLT 33 (L) 04/27/2016    Recent Labs Lab 04/25/16 1632 04/26/16 0500 04/27/16 0545  HGB 10.3* 8.9* 8.8*   Lab Results  Component Value Date   NA 153 (H) 04/27/2016   K 3.9 04/27/2016   CL 126 (H) 04/27/2016   CO2 23 04/27/2016   BUN 34 (H) 04/27/2016   CREATININE 0.82 04/27/2016   Lab Results  Component Value Date   ALT 65 (H) 04/25/2016   AST 40 04/25/2016   ALKPHOS 136 (H) 04/25/2016   BILITOT 0.9 04/25/2016    Recent Labs Lab 04/25/16 1900  INR 1.52   Assessment/Plan: Mr. Yoak is a 70 y.o. male with a known history TB s/p treatment in [place] (approx 1985), multiple myeloma with diffuse bone metastases (dx 03/2016, receiving chemo and XRT) admitted for possible pneumonia, AMS, and anorexia.  Patient endorses significantly decreased appetite and oral intake.  Labs notable for pancytopenia; Hgb stable at 8.8, but platelets now 33. I discussed this  patient with Dr. Vira Agar, and we agree that given patient's anorexia, aspiration PNA, and severe metastatic disease, this is likely failure to thrive. In addition, patient is not a good candidate for sedated procedures due to current pulmonary illness and significant thrombocytopenia.  Recommendations: - PEG tube placement not recommended - Continue PPN per RDs - Agree with palliative care consultation - Please re-consult if needed  Thank you for the consult. Please call with questions or concerns.  Lavera Guise, PA-C [facility] Phone: [phone] Pager: [phone]

## 2016-04-27 NOTE — Progress Notes (Signed)
CSW was informed by RN that patient's family wanted information about private care sitters and applying to Medicare. CSW met with patient's daughters. They requested CSW to complete Medicare applications. CSW introduced herself and her role. Patient's daughters inquired about why CSW does not complete Medicare applications. CSW provided patient's daughter with private sitter list, Medicare.gov information on how to apply to Medicare online and in the office. Encouraged them to complete Medicare application online. Informed them that Medicare and Medicaid does not pay for private sitters. CSW is signing off but is available if a need were to arise.  Ernest Pine, MSW, LCSW, [place] Clinical Social Worker [phone]

## 2016-04-27 NOTE — Progress Notes (Signed)
Nutrition Follow-up  DOCUMENTATION CODES:   Severe malnutrition in context of chronic illness  INTERVENTION:  Initiate TPN per pharmacy. Recommend initiating Clinimix E 5/20 @ 30 ml/hr. Can initiate ILE 20% @ 50 ml/hr x 10 hours on M/W/F. If patient tolerates and electrolytes are stable, can advance to goal on evening of 10/20 to Clinimix E 5/20 @ 55 ml/hr. Goal TPN provides 1590 kcal, 66 grams protein, 1320 ml fluid with total calories from lipids averaged over 7 days.  Monitor magnesium, potassium, and phosphorus daily for at least 3 days, MD to replete as needed, as pt is at risk for refeeding syndrome given severe malnutrition.  Continue Ensure Enlive po BID, each supplement provides 350 kcal and 20 grams of protein.  RD will continue to monitor for outcome of Palliative Care discussion regarding goals of care.  NUTRITION DIAGNOSIS:   Malnutrition related to chronic illness, cancer and cancer related treatments as evidenced by severe depletion of body fat, severe depletion of muscle mass, percent weight loss.  Ongoing.  GOAL:   Patient will meet greater than or equal to 90% of their needs  Not met.  MONITOR:   PO intake, Supplement acceptance, Labs, Weight trends  REASON FOR ASSESSMENT:   Malnutrition Screening Tool, Consult Assessment of nutrition requirement/status  ASSESSMENT:   70 y.o male admitted with AMS, SOB, possible pneumonia.  Pt with history of multiple myeloma, with bone mets receiving radiation and chemotherapy( 3 days ago).  Has been having diarrhea for 2 days as well.   RD consulted for new TPN. Family wants aggressive nutrition care, but it is unclear whether he will be able to receive PEG tube while patient has pneumonia. Palliative care has been consulted.  Family at bedside during assessment. Patient has only had 1/2 can of Ensure in the past 24 hours and small bites of food. Patient reports he is very uncomfortable.   Medications reviewed and  include: Colace, Pepcid, NS @ 50 ml/hr, Zofran prn.  Labs reviewed: CBG 98-102, Sodium 153, Chloride 126, BUN 34.   Discussed with RN.   Diet Order:  Diet regular Room service appropriate? Yes; Fluid consistency: Thin  Skin:  Reviewed, no issues  Last BM:  10/15  Height:   Ht Readings from Last 1 Encounters:  04/25/16 _0  (1.778 m)    Weight:   Wt Readings from Last 1 Encounters:  04/25/16 94 lb 1.9 oz (42.7 kg)    Ideal Body Weight:     BMI:  Body mass index is 13.5 kg/m.  Estimated Nutritional Needs:   Kcal:  1300-1500 kcals/d  Protein:  52-65 g/d  Fluid:  >/= 1.3 L/c  EDUCATION NEEDS:   Education needs addressed  Willey Blade, MS, RD, LDN Pager: [phone] After Hours Pager: [phone]

## 2016-04-28 ENCOUNTER — Ambulatory Visit: Payer: Medicare Other

## 2016-04-28 DIAGNOSIS — C9000 Multiple myeloma not having achieved remission: Secondary | ICD-10-CM | POA: Diagnosis not present

## 2016-04-28 DIAGNOSIS — R630 Anorexia: Secondary | ICD-10-CM | POA: Diagnosis not present

## 2016-04-28 DIAGNOSIS — R0602 Shortness of breath: Secondary | ICD-10-CM | POA: Diagnosis not present

## 2016-04-28 DIAGNOSIS — A419 Sepsis, unspecified organism: Secondary | ICD-10-CM | POA: Diagnosis not present

## 2016-04-28 DIAGNOSIS — J189 Pneumonia, unspecified organism: Secondary | ICD-10-CM | POA: Diagnosis not present

## 2016-04-28 DIAGNOSIS — R4182 Altered mental status, unspecified: Secondary | ICD-10-CM | POA: Diagnosis not present

## 2016-04-28 LAB — CBC WITH DIFFERENTIAL/PLATELET
BAND NEUTROPHILS: 9 %
BASOS ABS: 0 10*3/uL (ref 0–0.1)
Basophils Relative: 0 %
Blasts: 0 %
EOS ABS: 0.2 10*3/uL (ref 0–0.7)
EOS PCT: 4 %
HCT: 25.1 % — ABNORMAL LOW (ref 40.0–52.0)
Hemoglobin: 8.3 g/dL — ABNORMAL LOW (ref 13.0–18.0)
LYMPHS ABS: 0.3 10*3/uL — AB (ref 1.0–3.6)
Lymphocytes Relative: 7 %
MCH: 28.3 pg (ref 26.0–34.0)
MCHC: 33 g/dL (ref 32.0–36.0)
MCV: 85.8 fL (ref 80.0–100.0)
METAMYELOCYTES PCT: 0 %
MONOS PCT: 9 %
Monocytes Absolute: 0.4 10*3/uL (ref 0.2–1.0)
Myelocytes: 0 %
NEUTROS ABS: 3 10*3/uL (ref 1.4–6.5)
Neutrophils Relative %: 71 %
Other: 0 %
PLATELETS: 28 10*3/uL — AB (ref 150–440)
Promyelocytes Absolute: 0 %
RBC: 2.93 MIL/uL — AB (ref 4.40–5.90)
RDW: 18.7 % — AB (ref 11.5–14.5)
WBC: 3.9 10*3/uL (ref 3.8–10.6)
nRBC: 2 /100 WBC — ABNORMAL HIGH

## 2016-04-28 LAB — COMPREHENSIVE METABOLIC PANEL
ALBUMIN: 1.8 g/dL — AB (ref 3.5–5.0)
ALK PHOS: 142 U/L — AB (ref 38–126)
ALT: 70 U/L — AB (ref 17–63)
AST: 31 U/L (ref 15–41)
Anion gap: 3 — ABNORMAL LOW (ref 5–15)
BUN: 28 mg/dL — ABNORMAL HIGH (ref 6–20)
CALCIUM: 7.6 mg/dL — AB (ref 8.9–10.3)
CO2: 23 mmol/L (ref 22–32)
CREATININE: 0.73 mg/dL (ref 0.61–1.24)
Chloride: 127 mmol/L — ABNORMAL HIGH (ref 101–111)
GFR calc Af Amer: >60 mL/min (ref >60)
GFR calc non Af Amer: >60 mL/min (ref >60)
GLUCOSE: 131 mg/dL — AB (ref 65–99)
Potassium: 3.5 mmol/L (ref 3.5–5.1)
SODIUM: 153 mmol/L — AB (ref 135–145)
Total Bilirubin: 0.6 mg/dL (ref 0.3–1.2)
Total Protein: 5.3 g/dL — ABNORMAL LOW (ref 6.5–8.1)

## 2016-04-28 LAB — PHOSPHORUS
PHOSPHORUS: 2.3 mg/dL — AB (ref 2.5–4.6)
Phosphorus: 1.9 mg/dL — ABNORMAL LOW (ref 2.5–4.6)

## 2016-04-28 LAB — GLUCOSE, CAPILLARY
GLUCOSE-CAPILLARY: 119 mg/dL — AB (ref 65–99)
Glucose-Capillary: 102 mg/dL — ABNORMAL HIGH (ref 65–99)
Glucose-Capillary: 122 mg/dL — ABNORMAL HIGH (ref 65–99)
Glucose-Capillary: 114 mg/dL — ABNORMAL HIGH (ref 65–99)

## 2016-04-28 LAB — MAGNESIUM: Magnesium: 2.3 mg/dL (ref 1.7–2.4)

## 2016-04-28 LAB — VANCOMYCIN, TROUGH: Vancomycin Tr: 7 ug/mL — ABNORMAL LOW (ref 15–20)

## 2016-04-28 MED ORDER — SENNOSIDES-DOCUSATE SODIUM 8.6-50 MG PO TABS
1.0000 | ORAL_TABLET | Freq: Two times a day (BID) | ORAL | Status: DC
  Administered 2016-04-29 – 2016-04-30 (×2): 1 via ORAL
  Filled 2016-04-28 (×7): qty 1

## 2016-04-28 MED ORDER — DEXTROSE 5 % IV SOLN
INTRAVENOUS | Status: DC
Start: 2016-04-28 — End: 2016-05-01
  Administered 2016-04-28 – 2016-05-01 (×3): via INTRAVENOUS

## 2016-04-28 MED ORDER — FAMOTIDINE 20 MG PO TABS
20.0000 mg | ORAL_TABLET | Freq: Two times a day (BID) | ORAL | Status: DC
  Administered 2016-04-29: 20 mg via ORAL
  Filled 2016-04-28 (×2): qty 1

## 2016-04-28 MED ORDER — TRACE MINERALS CR-CU-MN-SE-ZN 10-1000-500-60 MCG/ML IV SOLN
INTRAVENOUS | Status: AC
  Administered 2016-04-28: 18:00:00 via INTRAVENOUS
  Filled 2016-04-28: qty 720

## 2016-04-28 MED ORDER — VANCOMYCIN HCL IN DEXTROSE 1-5 GM/200ML-% IV SOLN
1000.0000 mg | INTRAVENOUS | Status: DC
  Filled 2016-04-28: qty 200

## 2016-04-28 MED ORDER — BOOST / RESOURCE BREEZE PO LIQD
1.0000 | Freq: Three times a day (TID) | ORAL | Status: DC
  Administered 2016-04-28 – 2016-05-03 (×10): 1 via ORAL

## 2016-04-28 MED ORDER — POTASSIUM PHOSPHATES 15 MMOLE/5ML IV SOLN
30.0000 mmol | Freq: Once | INTRAVENOUS | Status: AC
  Administered 2016-04-28: 30 mmol via INTRAVENOUS
  Filled 2016-04-28: qty 10

## 2016-04-28 MED ORDER — VANCOMYCIN HCL IN DEXTROSE 1-5 GM/200ML-% IV SOLN
1000.0000 mg | INTRAVENOUS | Status: DC
  Administered 2016-04-28: 1000 mg via INTRAVENOUS
  Filled 2016-04-28 (×2): qty 200

## 2016-04-28 NOTE — Plan of Care (Signed)
Problem: Education: Goal: Knowledge of [place] [facility] Education information/materials will improve Outcome: Progressing Explained to patient and family diagnosis and treatment. Continues to ask same questions. RN continues to [employer].  ST evaluation ordered. ST at bedside evaluating.  Problem: Pain Managment: Goal: General experience of comfort will improve Outcome: Progressing Morphine 1 (one) mg iv given 3 (three) times during shift. Improvement noted.  Problem: Physical Regulation: Goal: Will remain free from infection Outcome: Progressing Remains on IV Antibiotics.

## 2016-04-28 NOTE — Progress Notes (Signed)
[name] NAME: Francisco [name]    MR#:  [id number]  DATE OF BIRTH:  [birth year]  SUBJECTIVE:  CHIEF COMPLAINT:   Chief Complaint  Patient presents with  . Altered Mental Status     Diagnosed with metastatic multiple myeloma a few months ago and have decreased oral intake with nausea and severe pain and have severe malnutrition. He was recently started on TPN 2 weeks ago, but had worsening in her overall condition and brought back with altered mental status. Found to have pneumonia.  He appears much more alert and completely oriented today, though very weak.  REVIEW OF SYSTEMS:    Review of Systems  Constitutional: Positive for malaise/fatigue and weight loss. Negative for fever.  HENT: Negative for hearing loss and sore throat.   Eyes: Negative for blurred vision and pain.  Respiratory: Negative for cough, sputum production and shortness of breath.   Cardiovascular: Negative for chest pain, palpitations and leg swelling.  Gastrointestinal: Negative for abdominal pain, diarrhea, heartburn, nausea and vomiting.  Genitourinary: Negative for dysuria.  Musculoskeletal: Negative for myalgias.  Skin: Negative for rash.  Neurological: Positive for weakness. Negative for dizziness, tremors, speech change, focal weakness and headaches.    DRUG ALLERGIES:  No Known Allergies  VITALS:  Blood pressure 129/82, pulse 88, temperature 97.8 F (36.6 C), temperature source Oral, resp. rate 18, height 5' 10"  (1.778 m), weight 46.6 kg (102 lb 11.2 oz), SpO2 97 %.  PHYSICAL EXAMINATION:   GENERAL:  70 y.o.-year-old Very thin patient lying in the bed with Some distress due to pain.  EYES: Pupils equal, round, reactive to light and accommodation. No scleral icterus.  HEENT: Head atraumatic, normocephalic. Oropharynx and nasopharynx clear.  NECK:  Supple, no jugular venous distention. No thyroid enlargement, no tenderness.  LUNGS: Normal  breath sounds bilaterally, no wheezing, few scattered rales,rhonchi or crepitations noted anteriorly. Shallow inspirations, effort . No use of accessory muscles of respiration.  CARDIOVASCULAR: S1, S2 normal. No murmurs, rubs, or gallops.  ABDOMEN: Soft, nontender, nondistended. Bowel sounds present. No organomegaly or mass. Hemoccult was performed in the emergency room was positive, although stool was brown, no black or tarry looking stool or blood, per emergency room physician EXTREMITIES: No pedal edema, cyanosis, or clubbing.  NEUROLOGIC: Cranial nerves 2 through 12 are normal . Muscle strength 3-4/5 ,  able to move all extremities. Sensation intact. Gait not checked.  PSYCHIATRIC: The patient is alert and completely oriented today. SKIN: No obvious rash, lesion, or ulcer.  Physical Exam LABORATORY PANEL:   CBC  Recent Labs Lab 04/28/16 1002  WBC 3.9  HGB 8.3*  HCT 25.1*  PLT 28*   ------------------------------------------------------------------------------------------------------------------  Chemistries   Recent Labs Lab 04/28/16 1002  NA 153*  K 3.5  CL 127*  CO2 23  GLUCOSE 131*  BUN 28*  CREATININE 0.73  CALCIUM 7.6*  MG 2.3  AST 31  ALT 70*  ALKPHOS 142*  BILITOT 0.6   ------------------------------------------------------------------------------------------------------------------  Cardiac Enzymes No results for input(s): TROPONINI in the last 168 hours. ------------------------------------------------------------------------------------------------------------------  RADIOLOGY:  Ct Chest Wo Contrast  Result Date: 04/27/2016 CLINICAL DATA:  New diagnosis of multiple myeloma with bony lesions, on chemotherapy and radiation therapy. Admitted with progressive weakness, shortness of breath, pancytopenia, history TB. EXAM: CT ANGIOGRAPHY CHEST WITH CONTRAST TECHNIQUE: Multidetector CT imaging of the chest was performed using the standard protocol during  bolus administration of intravenous contrast. Multiplanar CT image reconstructions and MIPs were  obtained to evaluate the vascular anatomy. Additional precontrast images were obtained. CONTRAST:  75 cc Isovue 370 IV. COMPARISON:  PET-CT 03/28/2016, chest radiograph 04/25/2016 FINDINGS: Cardiovascular: Atherosclerotic calcifications aorta without aneurysm or dissection. Pulmonary arteries well opacified and patent. No evidence of pulmonary embolism. No pericardial effusion. Mediastinum/Nodes: Esophagus dilated by air and fluid. No definite thoracic adenopathy. Base of cervical region unremarkable. Lungs/Pleura: Lungs appear emphysematous. Slightly nodular peribronchovascular infiltrates in both lower lobes with dense consolidation of a large portion of LEFT lower lobe as well. Minimal LEFT upper lobe infiltrate as well. Scattered areas of subpleural thickening and scarring. No definite pleural effusion or pneumothorax. Upper Abdomen: Single low-attenuation lesion centrally in liver 13 x 10 mm diameter image 78, question cyst. Remaining visualized upper abdomen unremarkable. Musculoskeletal: Expansile destructive lesion of the posterior LEFT ninth rib. Diffuse osseous demineralization. Additional scattered areas of cortical thinning and destruction in BILATERAL ribs. Lytic lesion at T3 vertebral body. Compression fractures at T4, T9, and T10, suspected to be pathologic. Review of the MIP images confirms the above findings. IMPRESSION: No evidence of pulmonary embolism. Aortic atherosclerosis. BILATERAL lower lobe infiltrates LEFT greater than RIGHT. Scattered bone lesions with thoracic spine compression fractures at T4, T9, and T10. Electronically Signed   By: Lavonia Dana M.D.   On: 04/26/2016 18:06   Ct Angio Chest Pe W Or Wo Contrast  Result Date: 04/27/2016 CLINICAL DATA:  New diagnosis of multiple myeloma with bony lesions, on chemotherapy and radiation therapy. Admitted with progressive weakness, shortness  of breath, pancytopenia, history TB. EXAM: CT ANGIOGRAPHY CHEST WITH CONTRAST TECHNIQUE: Multidetector CT imaging of the chest was performed using the standard protocol during bolus administration of intravenous contrast. Multiplanar CT image reconstructions and MIPs were obtained to evaluate the vascular anatomy. Additional precontrast images were obtained. CONTRAST:  75 cc Isovue 370 IV. COMPARISON:  PET-CT 03/28/2016, chest radiograph 04/25/2016 FINDINGS: Cardiovascular: Atherosclerotic calcifications aorta without aneurysm or dissection. Pulmonary arteries well opacified and patent. No evidence of pulmonary embolism. No pericardial effusion. Mediastinum/Nodes: Esophagus dilated by air and fluid. No definite thoracic adenopathy. Base of cervical region unremarkable. Lungs/Pleura: Lungs appear emphysematous. Slightly nodular peribronchovascular infiltrates in both lower lobes with dense consolidation of a large portion of LEFT lower lobe as well. Minimal LEFT upper lobe infiltrate as well. Scattered areas of subpleural thickening and scarring. No definite pleural effusion or pneumothorax. Upper Abdomen: Single low-attenuation lesion centrally in liver 13 x 10 mm diameter image 78, question cyst. Remaining visualized upper abdomen unremarkable. Musculoskeletal: Expansile destructive lesion of the posterior LEFT ninth rib. Diffuse osseous demineralization. Additional scattered areas of cortical thinning and destruction in BILATERAL ribs. Lytic lesion at T3 vertebral body. Compression fractures at T4, T9, and T10, suspected to be pathologic. Review of the MIP images confirms the above findings. IMPRESSION: No evidence of pulmonary embolism. Aortic atherosclerosis. BILATERAL lower lobe infiltrates LEFT greater than RIGHT. Scattered bone lesions with thoracic spine compression fractures at T4, T9, and T10. Electronically Signed   By: Lavonia Dana M.D.   On: 04/26/2016 18:06    ASSESSMENT AND PLAN:   Active  Problems:   Pancytopenia (HCC)   Metabolic encephalopathy   Healthcare-associated pneumonia   Elevated transaminase level   SIRS (systemic inflammatory response syndrome) (HCC)   Protein-calorie malnutrition, severe  * Sepsis with HCAP   Likely Aspiration pneumonia       Appreciated ID consult, try to get sputum cx    Abx changed to Zosyn+ azithromycin.    Also ordered AFB  culture.    SLP eval.    Councelled family about no oral intake.  * metabolic encephalopathy   Due to healthcare associated pneumonia and malnutrition   Also under effect of pain medications.   Completely alert and oriented today.    Continue monitoring.  * Metastatic multiple myeloma   Patient looks to have very poor prognosis.   Palliative care appreciated but family still wanted to give it a try if he can tolerate chemotherapy.   I discussed with Dr. Vicenta Aly- he suggested if patient has improvement in his nutrition then he may try chemotherapy agents which have given him promise in the results and he may have a few years survival with good quality of life.    * Severe malnutrition   Dietary consult, started again on TPN.   He may need to address feeding issue for long-term so called GI consult.   Appreciated GI consult, because patient has thrombocytopenia and active infection currently not a candidate for any procedure. Reevaluate in the future if he improves.  * Pancytopenia   Oncology on the case, keep monitoring.   All the records are reviewed and case discussed with Care Management/Social Workerr. Management plans discussed with the patient, family and they are in agreement.  CODE STATUS: full  TOTAL TIME TAKING CARE OF THIS PATIENT: 45 minutes.   I had detailed discussion with the patient's daughter and son in the room and discussed the case with oncologist, palliative care nurse practitioner .  POSSIBLE D/C IN 2-3 DAYS, DEPENDING ON CLINICAL CONDITION.   Vaughan Basta M.D on  04/28/2016   Between 7am to 6pm - Pager - [phone]  After 6pm go to [email or web address] - password [email or web address] [facility]  Office  [phone]  CC: Primary care physician; Alfonse Flavors, MD  Note: This dictation was prepared with Dragon dictation along with smaller phrase technology. Any transcriptional errors that result from this process are unintentional.

## 2016-04-28 NOTE — Consult Note (Signed)
PHARMACY - ADULT TOTAL PARENTERAL NUTRITION CONSULT NOTE   Pharmacy Consult for TPN Indication:Severe malnutrition in context of chronic illness  Patient Measurements: Height: 5\' 10"  (177.8 cm) Weight: 102 lb 11.2 oz (46.6 kg) IBW/kg (Calculated) : 73 TPN AdjBW (KG): 42.7 Body mass index is 14.74 kg/m. Usual Weight:   Assessment:   GI: not a candidate for tube placement, at least at this time Endo: Insulin requirements in the past 24 hours:0 units Lytes:k=3.5, Mg=2.3, phos=1.9 Renal: Pulm: possible aspiration PNA on azithro, zosyn, vanc Cards: Hepatobil: Neuro: ID: possible aspiration PNA on azithro, zosyn, vanc  Best Practices: TPN Access:PICC TPN start date: started at previous admission this month, restarted 10/18  Nutritional Goals (per RD recommendation on 10/18): 1590 kcal, 66 grams protein, 1320 ml fluid with total calories from lipids averaged over 7 days   Current Nutrition:   Plan:  Continue Clinimix 5/20 at 35ml/hr (Na still high)- goal rate 72ml/hr Continue to monitor K, phos, Mg closley as pt is at risk for refeeding.  Will give Kphos IV 36mmol once due to low phos. Recheck phos this evening.All electrolytes in the AM Strict I/O, daily weights, CBG q 4hr checks with sensitive SSI ordered.  D/C NS and start D5W @ 54ml/hr  Ramond Dial, Pharm.D Clinical Pharmacist  04/28/2016,12:08 PM

## 2016-04-28 NOTE — Progress Notes (Signed)
Nutrition Follow-up  DOCUMENTATION CODES:   Severe malnutrition in context of chronic illness  INTERVENTION:  -Do not recommend increasing TPN of Clinimix E 5/20 but to continue at 65m/hr secondary to elevated Na and low phosphorus.  Discussed with pharmacist Melissa possibility of discontinuing NS and starting D5 to help correct Na.  Will hold off on starting lipids until tolerating TPN at goal rate.  Pharmacist to discuss with MD VMarthann Schiller  NUTRITION DIAGNOSIS:   Malnutrition related to chronic illness, cancer and cancer related treatments as evidenced by severe depletion of body fat, severe depletion of muscle mass, percent weight loss.  Ongoing  GOAL:   Patient will meet greater than or equal to 90% of their needs  Not meeting nutritional needs  MONITOR:   PO intake, Supplement acceptance, Labs, Weight trends  REASON FOR ASSESSMENT:   Malnutrition Screening Tool, Consult Assessment of nutrition requirement/status  ASSESSMENT:   70y.o male admitted with AMS, SOB, possible pneumonia.  Pt with history of multiple myeloma, with bone mets receiving radiation and chemotherapy( 3 days ago).  Has been having diarrhea for 2 days as well.   TPN infusing via PICC line at 38mhr.   UOP: 45057mver last 24 hr  Reviewed GI note and noted PEG tube not recommended. Palliative consulted.  Pt with minimal po intake.    Medications reviewed Labs reviewed: Na 153, phosphorus 1.9, Mg WNL   Diet Order:  Diet regular Room service appropriate? Yes; Fluid consistency: Thin .TPN (CLINIMIX-E) Adult  Skin:  Reviewed, no issues  Last BM:  10/15  Height:   Ht Readings from Last 1 Encounters:  04/25/16 5' 10"  (1.778 m)    Weight:   Wt Readings from Last 1 Encounters:  04/28/16 102 lb 11.2 oz (46.6 kg)    Ideal Body Weight:     BMI:  Body mass index is 14.74 kg/m.  Estimated Nutritional Needs:   Kcal:  1300-1500 kcals/d  Protein:  52-65 g/d  Fluid:  >/= 1.3  L/c  EDUCATION NEEDS:   Education needs addressed  Rulon Abdalla B. AllZenia ResidesD,[place] CrossroadsDN[place]cher[place]) Weekend/On-Call pager ([phone]

## 2016-04-28 NOTE — Progress Notes (Signed)
[facility] [place] NAME: Francisco [name]    MR#:  [id number]  DATE OF BIRTH:  1945-07-29  SUBJECTIVE:  CHIEF COMPLAINT:   Chief Complaint  Patient presents with  . Altered Mental Status     Diagnosed with metastatic multiple myeloma a few months ago and have decreased oral intake with nausea and severe pain and have severe malnutrition. He was recently started on TPN 2 weeks ago, but had worsening in her overall condition and brought back with altered mental status. Found to have pneumonia.  He was lethargic earlier when I saw him.  REVIEW OF SYSTEMS:   Because of lethargic, he is not able to give me any review of system.  ROS  DRUG ALLERGIES:  No Known Allergies  VITALS:  Blood pressure 129/82, pulse 88, temperature 97.8 F (36.6 C), temperature source Oral, resp. rate 18, height 5' 10"  (1.778 m), weight 46.6 kg (102 lb 11.2 oz), SpO2 97 %.  PHYSICAL EXAMINATION:   GENERAL:  70 y.o.-year-old patient lying in the bed with no acute distress. Very somnolent, barely able to open his eyes, nonverbal, does not follow commands EYES: Pupils equal, round, reactive to light and accommodation. No scleral icterus.  HEENT: Head atraumatic, normocephalic. Oropharynx and nasopharynx clear.  NECK:  Supple, no jugular venous distention. No thyroid enlargement, no tenderness.  LUNGS: Normal breath sounds bilaterally, no wheezing, few scattered rales,rhonchi or crepitations noted anteriorly. Shallow inspirations, effort . No use of accessory muscles of respiration.  CARDIOVASCULAR: S1, S2 normal. No murmurs, rubs, or gallops.  ABDOMEN: Soft, nontender, nondistended. Bowel sounds present. No organomegaly or mass. Hemoccult was performed in the emergency room was positive, although stool was brown, no black or tarry looking stool or blood, per emergency room physician EXTREMITIES: No pedal edema, cyanosis, or clubbing.  NEUROLOGIC: Cranial nerves  not able to assess. Muscle strength , unable to evaluate, but able to move all extremities. Sensation unable to evaluate. Gait not checked.  PSYCHIATRIC: The patient is very somnolent, briefly opens his eyes,goes back to sleep, and does not follow commands, nonverbal, not able to assess orientation.  SKIN: No obvious rash, lesion, or ulcer.  Physical Exam LABORATORY PANEL:   CBC  Recent Labs Lab 04/28/16 1002  WBC 3.9  HGB 8.3*  HCT 25.1*  PLT 28*   ------------------------------------------------------------------------------------------------------------------  Chemistries   Recent Labs Lab 04/28/16 1002  NA 153*  K 3.5  CL 127*  CO2 23  GLUCOSE 131*  BUN 28*  CREATININE 0.73  CALCIUM 7.6*  MG 2.3  AST 31  ALT 70*  ALKPHOS 142*  BILITOT 0.6   ------------------------------------------------------------------------------------------------------------------  Cardiac Enzymes No results for input(s): TROPONINI in the last 168 hours. ------------------------------------------------------------------------------------------------------------------  RADIOLOGY:  Ct Chest Wo Contrast  Result Date: 04/27/2016 CLINICAL DATA:  New diagnosis of multiple myeloma with bony lesions, on chemotherapy and radiation therapy. Admitted with progressive weakness, shortness of breath, pancytopenia, history TB. EXAM: CT ANGIOGRAPHY CHEST WITH CONTRAST TECHNIQUE: Multidetector CT imaging of the chest was performed using the standard protocol during bolus administration of intravenous contrast. Multiplanar CT image reconstructions and MIPs were obtained to evaluate the vascular anatomy. Additional precontrast images were obtained. CONTRAST:  75 cc Isovue 370 IV. COMPARISON:  PET-CT 03/28/2016, chest radiograph 04/25/2016 FINDINGS: Cardiovascular: Atherosclerotic calcifications aorta without aneurysm or dissection. Pulmonary arteries well opacified and patent. No evidence of pulmonary embolism.  No pericardial effusion. Mediastinum/Nodes: Esophagus dilated by air and fluid. No definite thoracic adenopathy. Base  of cervical region unremarkable. Lungs/Pleura: Lungs appear emphysematous. Slightly nodular peribronchovascular infiltrates in both lower lobes with dense consolidation of a large portion of LEFT lower lobe as well. Minimal LEFT upper lobe infiltrate as well. Scattered areas of subpleural thickening and scarring. No definite pleural effusion or pneumothorax. Upper Abdomen: Single low-attenuation lesion centrally in liver 13 x 10 mm diameter image 78, question cyst. Remaining visualized upper abdomen unremarkable. Musculoskeletal: Expansile destructive lesion of the posterior LEFT ninth rib. Diffuse osseous demineralization. Additional scattered areas of cortical thinning and destruction in BILATERAL ribs. Lytic lesion at T3 vertebral body. Compression fractures at T4, T9, and T10, suspected to be pathologic. Review of the MIP images confirms the above findings. IMPRESSION: No evidence of pulmonary embolism. Aortic atherosclerosis. BILATERAL lower lobe infiltrates LEFT greater than RIGHT. Scattered bone lesions with thoracic spine compression fractures at T4, T9, and T10. Electronically Signed   By: Lavonia Dana M.D.   On: 04/26/2016 18:06   Ct Angio Chest Pe W Or Wo Contrast  Result Date: 04/27/2016 CLINICAL DATA:  New diagnosis of multiple myeloma with bony lesions, on chemotherapy and radiation therapy. Admitted with progressive weakness, shortness of breath, pancytopenia, history TB. EXAM: CT ANGIOGRAPHY CHEST WITH CONTRAST TECHNIQUE: Multidetector CT imaging of the chest was performed using the standard protocol during bolus administration of intravenous contrast. Multiplanar CT image reconstructions and MIPs were obtained to evaluate the vascular anatomy. Additional precontrast images were obtained. CONTRAST:  75 cc Isovue 370 IV. COMPARISON:  PET-CT 03/28/2016, chest radiograph 04/25/2016  FINDINGS: Cardiovascular: Atherosclerotic calcifications aorta without aneurysm or dissection. Pulmonary arteries well opacified and patent. No evidence of pulmonary embolism. No pericardial effusion. Mediastinum/Nodes: Esophagus dilated by air and fluid. No definite thoracic adenopathy. Base of cervical region unremarkable. Lungs/Pleura: Lungs appear emphysematous. Slightly nodular peribronchovascular infiltrates in both lower lobes with dense consolidation of a large portion of LEFT lower lobe as well. Minimal LEFT upper lobe infiltrate as well. Scattered areas of subpleural thickening and scarring. No definite pleural effusion or pneumothorax. Upper Abdomen: Single low-attenuation lesion centrally in liver 13 x 10 mm diameter image 78, question cyst. Remaining visualized upper abdomen unremarkable. Musculoskeletal: Expansile destructive lesion of the posterior LEFT ninth rib. Diffuse osseous demineralization. Additional scattered areas of cortical thinning and destruction in BILATERAL ribs. Lytic lesion at T3 vertebral body. Compression fractures at T4, T9, and T10, suspected to be pathologic. Review of the MIP images confirms the above findings. IMPRESSION: No evidence of pulmonary embolism. Aortic atherosclerosis. BILATERAL lower lobe infiltrates LEFT greater than RIGHT. Scattered bone lesions with thoracic spine compression fractures at T4, T9, and T10. Electronically Signed   By: Lavonia Dana M.D.   On: 04/26/2016 18:06    ASSESSMENT AND PLAN:   Active Problems:   Pancytopenia (HCC)   Metabolic encephalopathy   Healthcare-associated pneumonia   Elevated transaminase level   SIRS (systemic inflammatory response syndrome) (HCC)   Protein-calorie malnutrition, severe  * Sepsis with HCAP   Likely Aspiration pneumonia       Appreciated ID consult, try to get sputum cx    Abx changed to Zosyn+ azithromycin.    Also ordered AFB culture.    SLP eval.    Councelled family about no oral intake.  *  metabolic encephalopathy   Due to healthcare associated pneumonia and malnutrition   Also under effect of pain medications.    Continue monitoring.  * Metastatic multiple myeloma   Patient looks to have very poor prognosis.   Palliative care appreciated  but family still wanted to give it a try if he can tolerate chemotherapy.   I discussed with Dr. Vicenta Aly- he suggested if patient has improvement in his nutrition then he may try chemotherapy agents which have given him promise in the results and he may have a few years survival with good quality of life.    * Severe malnutrition   Dietary consult, started again on TPN.   He may need to address feeding issue for long-term so called GI consult.  * Pancytopenia   Oncology on the case, keep monitoring.    All the records are reviewed and case discussed with Care Management/Social Workerr. Management plans discussed with the patient, family and they are in agreement.  CODE STATUS: full  TOTAL TIME TAKING CARE OF THIS PATIENT: 45 minutes.   I had detailed discussion with the patient's daughter and son in the room and discussed the case with oncologist, radiating care nurse practitioner and gastroenterologist.  POSSIBLE D/C IN 2-3 DAYS, DEPENDING ON CLINICAL CONDITION.   Vaughan Basta M.D on 04/28/2016   Between 7am to 6pm - Pager - [phone]  After 6pm go to [email or web address] - password [email or web address] [facility]  Office  [phone]  CC: Primary care physician; Alfonse Flavors, MD  Note: This dictation was prepared with Dragon dictation along with smaller phrase technology. Any transcriptional errors that result from this process are unintentional.

## 2016-04-28 NOTE — Progress Notes (Signed)
Francisco [name]   DOB:1946/03/13   YN#:[id number]    Subjective: Patient resting comfortably. He states that able to swallow better. No cough. Intermittent hiccups.   Objective:  Vitals:   04/28/16 0910 04/28/16 1339  BP: 131/74 129/82  Pulse: 70 88  Resp: 20 18  Temp: 98.5 F (36.9 C) 97.8 F (36.6 C)     Intake/Output Summary (Last 24 hours) at 04/28/16 1841 Last data filed at 04/28/16 1342  Gross per 24 hour  Intake          2564.34 ml  Output              300 ml  Net          2264.34 ml    GENERAL Alert, no distress and comfortable.  Cachectic appearing. Accompanied by multiple family members EYES: no pallor or icterus OROPHARYNX: no thrush or ulceration. NECK: supple, no masses felt LYMPH:  no palpable lymphadenopathy in the cervical, axillary or inguinal regions LUNGS: decreased breath sounds to auscultation at bases and  No wheeze or crackles HEART/CVS: regular rate & rhythm and no murmurs; No lower extremity edema ABDOMEN: abdomen soft, tender  on deep palpation. and normal bowel sounds Musculoskeletal:no cyanosis of digits and no clubbing  PSYCH: alert & oriented x 3 with fluent speech NEURO: no focal motor/sensory deficits SKIN:  no rashes or significant lesions   Labs:  Lab Results  Component Value Date   WBC 3.9 04/28/2016   HGB 8.3 (L) 04/28/2016   HCT 25.1 (L) 04/28/2016   MCV 85.8 04/28/2016   PLT 28 (LL) 04/28/2016   NEUTROABS 3.0 04/28/2016    Lab Results  Component Value Date   NA 153 (H) 04/28/2016   K 3.5 04/28/2016   CL 127 (H) 04/28/2016   CO2 23 04/28/2016    Studies:  Ct Chest Wo Contrast  Result Date: 04/27/2016 CLINICAL DATA:  New diagnosis of multiple myeloma with bony lesions, on chemotherapy and radiation therapy. Admitted with progressive weakness, shortness of breath, pancytopenia, history TB. EXAM: CT ANGIOGRAPHY CHEST WITH CONTRAST TECHNIQUE: Multidetector CT imaging of the chest was performed using the standard protocol  during bolus administration of intravenous contrast. Multiplanar CT image reconstructions and MIPs were obtained to evaluate the vascular anatomy. Additional precontrast images were obtained. CONTRAST:  75 cc Isovue 370 IV. COMPARISON:  PET-CT 03/28/2016, chest radiograph 04/25/2016 FINDINGS: Cardiovascular: Atherosclerotic calcifications aorta without aneurysm or dissection. Pulmonary arteries well opacified and patent. No evidence of pulmonary embolism. No pericardial effusion. Mediastinum/Nodes: Esophagus dilated by air and fluid. No definite thoracic adenopathy. Base of cervical region unremarkable. Lungs/Pleura: Lungs appear emphysematous. Slightly nodular peribronchovascular infiltrates in both lower lobes with dense consolidation of a large portion of LEFT lower lobe as well. Minimal LEFT upper lobe infiltrate as well. Scattered areas of subpleural thickening and scarring. No definite pleural effusion or pneumothorax. Upper Abdomen: Single low-attenuation lesion centrally in liver 13 x 10 mm diameter image 78, question cyst. Remaining visualized upper abdomen unremarkable. Musculoskeletal: Expansile destructive lesion of the posterior LEFT ninth rib. Diffuse osseous demineralization. Additional scattered areas of cortical thinning and destruction in BILATERAL ribs. Lytic lesion at T3 vertebral body. Compression fractures at T4, T9, and T10, suspected to be pathologic. Review of the MIP images confirms the above findings. IMPRESSION: No evidence of pulmonary embolism. Aortic atherosclerosis. BILATERAL lower lobe infiltrates LEFT greater than RIGHT. Scattered bone lesions with thoracic spine compression fractures at T4, T9, and T10. Electronically Signed   By: Lavonia Dana  M.D.   On: 04/26/2016 18:06   Ct Angio Chest Pe W Or Wo Contrast  Result Date: 04/27/2016 CLINICAL DATA:  New diagnosis of multiple myeloma with bony lesions, on chemotherapy and radiation therapy. Admitted with progressive weakness,  shortness of breath, pancytopenia, history TB. EXAM: CT ANGIOGRAPHY CHEST WITH CONTRAST TECHNIQUE: Multidetector CT imaging of the chest was performed using the standard protocol during bolus administration of intravenous contrast. Multiplanar CT image reconstructions and MIPs were obtained to evaluate the vascular anatomy. Additional precontrast images were obtained. CONTRAST:  75 cc Isovue 370 IV. COMPARISON:  PET-CT 03/28/2016, chest radiograph 04/25/2016 FINDINGS: Cardiovascular: Atherosclerotic calcifications aorta without aneurysm or dissection. Pulmonary arteries well opacified and patent. No evidence of pulmonary embolism. No pericardial effusion. Mediastinum/Nodes: Esophagus dilated by air and fluid. No definite thoracic adenopathy. Base of cervical region unremarkable. Lungs/Pleura: Lungs appear emphysematous. Slightly nodular peribronchovascular infiltrates in both lower lobes with dense consolidation of a large portion of LEFT lower lobe as well. Minimal LEFT upper lobe infiltrate as well. Scattered areas of subpleural thickening and scarring. No definite pleural effusion or pneumothorax. Upper Abdomen: Single low-attenuation lesion centrally in liver 13 x 10 mm diameter image 78, question cyst. Remaining visualized upper abdomen unremarkable. Musculoskeletal: Expansile destructive lesion of the posterior LEFT ninth rib. Diffuse osseous demineralization. Additional scattered areas of cortical thinning and destruction in BILATERAL ribs. Lytic lesion at T3 vertebral body. Compression fractures at T4, T9, and T10, suspected to be pathologic. Review of the MIP images confirms the above findings. IMPRESSION: No evidence of pulmonary embolism. Aortic atherosclerosis. BILATERAL lower lobe infiltrates LEFT greater than RIGHT. Scattered bone lesions with thoracic spine compression fractures at T4, T9, and T10. Electronically Signed   By: Lavonia Dana M.D.   On: 04/26/2016 18:06    Assessment & Plan:   #  70 year old male patient history of multiple myeloma currently admitted to hospital for poor by mouth intake/increasing shortness of breath/altered mental status  # Multiple myeloma status post cycle # cycle #2 day 8-of Velcade/Revlimid 20 mg once a day for one week. Treatment can done on hold because of acute respiratory issues. Awaiting on M protein; kappa lambda light chain ratio.  # Poor by mouth intake/esophageal dilatation noted on the CT scan.  Continue TPN for now. Will need upper endoscopy when medically stable/also with improvement of platelets. Discussed with Dr.Elliot yesterday.   # Pneumonia/possibly aspiration- currently on antibiotics as per ID.   # Above plan discussed with the patient and family/daughter over the phone.   Cammie Sickle, MD 04/28/2016  6:41 PM

## 2016-04-28 NOTE — Consult Note (Signed)
GI Inpatient Consult Note  Reason for Consult:malnourishment   Attending Requesting Consult:Hospitalist  History of Present Illness: Francisco [name] is a 70 y.o. male with multiple medical problems including  Malnourishment and metastatic multiple myeloma.  He has a very low platelet count and aspiration pneumonia.  He does not like to use the bed pan for a bowel movement.  He has not been eating for several days and was afraid that drinking might cause him to have aspiration. We had him sit upright and drink Boost and water and did well without aspiration.  Past Medical History:  Past Medical History:  Diagnosis Date  . Anemia   . Cancer (HCC)    Bone metastasis  . Constipation   . Hearing loss   . Hoarse voice quality   . Liver lesion   . Malaria 2003  . Multiple myeloma ([place]) 03/22/2016   Per Dr. Rogue Bussing on his PET order.  . Tuberculosis 1985    Problem List: Patient Active Problem List   Diagnosis Date Noted  . Protein-calorie malnutrition, severe 04/26/2016  . Pancytopenia ([place]) 04/25/2016  . Metabolic encephalopathy 40/98/1191  . Healthcare-associated pneumonia 04/25/2016  . Elevated transaminase level 04/25/2016  . SIRS (systemic inflammatory response syndrome) ([place]) 04/25/2016  . Anorexia   . Abnormal loss of weight   . Intractable vomiting with nausea 04/06/2016  . Dehydration 04/05/2016  . Multiple myeloma in relapse ([place]) 03/16/2016  . Pernicious anemia 03/16/2016    Past Surgical History: Past Surgical History:  Procedure Laterality Date  . RETINAL DETACHMENT SURGERY  2006    Allergies: No Known Allergies  Home Medications: Facility-Administered Medications Prior to Admission  Medication Dose Route Frequency Provider Last Rate Last Dose  . ondansetron (ZOFRAN) 8 mg in sodium chloride 0.9 % 50 mL IVPB  8 mg Intravenous Once Cammie Sickle, MD      . ondansetron (ZOFRAN) 8 mg in sodium chloride 0.9 % 50 mL IVPB  8 mg Intravenous Once Cammie Sickle, MD      . ondansetron (ZOFRAN-ODT) disintegrating tablet 8 mg  8 mg Oral Q8H PRN Cammie Sickle, MD       Prescriptions Prior to Admission  Medication Sig Dispense Refill Last Dose  . acyclovir (ZOVIRAX) 400 MG tablet Take 1 tablet (400 mg total) by mouth daily. 30 tablet 6   . alum & mag hydroxide-simeth (MAALOX/MYLANTA) 200-200-20 MG/5ML suspension Take 30 mLs by mouth every 4 (four) hours as needed for indigestion or heartburn. 355 mL 0   . cholecalciferol (VITAMIN D) 1000 units tablet Take 1,000 Units by mouth daily.   Not Taking  . dronabinol (MARINOL) 2.5 MG capsule Take 1 capsule (2.5 mg total) by mouth 2 (two) times daily before lunch and supper. 30 capsule 0   . esomeprazole (NEXIUM) 40 MG capsule Take 1 capsule (40 mg total) by mouth 2 (two) times daily before a meal. 60 capsule 6   . fentaNYL (DURAGESIC - DOSED MCG/HR) 12 MCG/HR Place 1 patch (12.5 mcg total) onto the skin every 3 (three) days. 7 patch 0   . HYDROcodone-acetaminophen (NORCO/VICODIN) 5-325 MG tablet Take 1 tablet by mouth every 4 (four) hours as needed for moderate pain. 60 tablet 0 Taking  . lactulose (CHRONULAC) 10 GM/15ML solution Take 30 mLs (20 g total) by mouth 2 (two) times daily as needed for mild constipation, moderate constipation or severe constipation. 240 mL 0 Not Taking  . magnesium hydroxide (MILK OF MAGNESIA) 400 MG/5ML suspension Take 5  mLs by mouth daily as needed for mild constipation.   Taking  . ondansetron (ZOFRAN ODT) 8 MG disintegrating tablet Take 1 tablet (8 mg total) by mouth every 8 (eight) hours as needed for nausea or vomiting. 20 tablet 3   . ondansetron (ZOFRAN) 8 MG tablet Take 1 tablet (8 mg total) by mouth every 8 (eight) hours as needed for nausea or vomiting. 30 tablet 3 Taking  . phosphorus (K-PHOS-NEUTRAL) 155-852-130 MG tablet Take 1 tablet (250 mg total) by mouth 2 (two) times daily. 60 tablet 0 Not Taking  . prochlorperazine (COMPAZINE) 10 MG tablet Take 1 tablet  (10 mg total) by mouth every 6 (six) hours as needed for nausea or vomiting. 30 tablet 3   . sertraline (ZOLOFT) 25 MG tablet Take 1 tablet (25 mg total) by mouth daily. 30 tablet 0   . sucralfate (CARAFATE) 1 g tablet Take 1 tablet (1 g total) by mouth 4 (four) times daily -  with meals and at bedtime. 30 tablet 1   . thiamine (VITAMIN B-1) 100 MG tablet Take 1 tablet (100 mg total) by mouth daily. 30 tablet 0   . vitamin B-12 (CYANOCOBALAMIN) 1000 MCG tablet Take 1,000 mcg by mouth daily.   Not Taking  . Vitamin D, Ergocalciferol, (DRISDOL) 50000 units CAPS capsule Take 50,000 Units by mouth every 7 (seven) days.   Not Taking   Home medication reconciliation was completed with the patient.   Scheduled Inpatient Medications:   . albuterol  2.5 mg Nebulization TID  . azithromycin  500 mg Intravenous Q24H  . docusate sodium  100 mg Oral BID  . famotidine  20 mg Oral BID  . feeding supplement  1 Container Oral TID BM  . insulin aspart  0-9 Units Subcutaneous Q4H  . mouth rinse  15 mL Mouth Rinse BID  . piperacillin-tazobactam (ZOSYN)  IV  3.375 g Intravenous Q8H  . potassium phosphate IVPB (mmol)  30 mmol Intravenous Once  . senna-docusate  1 tablet Oral BID  . sodium chloride flush  3 mL Intravenous Q12H  . tiotropium  18 mcg Inhalation Daily  . vancomycin  1,000 mg Intravenous Q18H    Continuous Inpatient Infusions:   . Marland KitchenTPN (CLINIMIX-E) Adult 30 mL/hr at 04/28/16 0519  . Marland KitchenTPN (CLINIMIX-E) Adult    . dextrose      PRN Inpatient Medications:  acetaminophen **OR** acetaminophen, HYDROcodone-acetaminophen, magnesium hydroxide, morphine injection, [DISCONTINUED] ondansetron **OR** ondansetron (ZOFRAN) IV  Family History: family history is not on file.  The patient's family history is negative for inflammatory bowel disorders, GI malignancy, or solid organ transplantation.  Social History:   reports that he has never smoked. He has never used smokeless tobacco. He reports that he  does not drink alcohol or use drugs. The patient denies ETOH, tobacco, or drug use.   Review of Systems: Constitutional: Weight is stable.  Eyes: No changes in vision. ENT: No oral lesions, sore throat.  GI: see HPI.  Heme/Lymph: No easy bruising.  CV: No chest pain.  GU: No hematuria.  Integumentary: No rashes.  Neuro: No headaches.  Psych: No depression/anxiety.  Endocrine: No heat/cold intolerance.  Allergic/Immunologic: No urticaria.  Resp: No cough, SOB.  Musculoskeletal: No joint swelling.    Physical Examination: BP 129/82 (BP Location: Left Arm)   Pulse 88   Temp 97.8 F (36.6 C) (Oral)   Resp 18   Ht 5' 10"  (1.778 m)   Wt 46.6 kg (102 lb 11.2 oz)  SpO2 97%   BMI 14.74 kg/m  Gen: NAD, alert and oriented x 4 HEENT: PEERLA, EOMI, Neck: supple, no JVD or thyromegaly Chest:  CV: RRR, no m/g/c/r Abd: distended, no suprapubic percussion showing a distended bladder. Ext: no edema, well perfused with 2+ pulses, Skin: no rash or lesions noted Lymph: no LAD Rectal exam shows no impaction, small pebbles of stool in rectal vault.  Data: Lab Results  Component Value Date   WBC 3.9 04/28/2016   HGB 8.3 (L) 04/28/2016   HCT 25.1 (L) 04/28/2016   MCV 85.8 04/28/2016   PLT 28 (LL) 04/28/2016    Recent Labs Lab 04/26/16 0500 04/27/16 0545 04/28/16 1002  HGB 8.9* 8.8* 8.3*   Lab Results  Component Value Date   NA 153 (H) 04/28/2016   K 3.5 04/28/2016   CL 127 (H) 04/28/2016   CO2 23 04/28/2016   BUN 28 (H) 04/28/2016   CREATININE 0.73 04/28/2016   Lab Results  Component Value Date   ALT 70 (H) 04/28/2016   AST 31 04/28/2016   ALKPHOS 142 (H) 04/28/2016   BILITOT 0.6 04/28/2016    Recent Labs Lab 04/25/16 1900  INR 1.52   Assessment/Plan: Mr. Zelek is a 70 y.o. male with multiple problems including malnourishment and multiple myeloma.   He is not a candidate for a PEG due to low platelets count.  Will have him drink nutritional supplements  sitting upright for calories, check abd film in morning.  Get bladder scan tonight.  Sodium of 153, plt ct 28,000.   Recommendations:Calorie via oral route, not candidate for PEG.  Thank you for the consult. Please call with questions or concerns.  Gaylyn Cheers, MD

## 2016-04-28 NOTE — Progress Notes (Signed)
CONCERNING: IV to Oral Route Change Policy  RECOMMENDATION: This patient is receiving famotidine by the intravenous route.  Based on criteria approved by the Pharmacy and Therapeutics Committee, the intravenous medication(s) is/are being converted to the equivalent oral dose form(s).   DESCRIPTION: These criteria include:  The patient is eating (either orally or via tube) and/or has been taking other orally administered medications for a least 24 hours  The patient has no evidence of active gastrointestinal bleeding or impaired GI absorption (gastrectomy, short bowel, patient on TNA or NPO).  If you have questions about this conversion, please contact the Pharmacy Department  []   [phone] )  [facility] [x]   [phone] )  [facility] []   [phone] )  [facility] []   [phone] )  [facility] []   [phone] )  [place], [facility] 04/28/2016 11:46 AM

## 2016-04-28 NOTE — Progress Notes (Signed)
Pharmacy Antibiotic Note  Francisco [name] is a 70 y.o. male admitted on 04/25/2016 with sepsis.  Pharmacy has been consulted for vancomycin and zosyn dosing.   Plan: vanc trough results at 7. Increase dose to vancomycin 1g q 18 hours. Trough prior to the 4th new dose 10/21 @ 2000. Continue zosyn 3.375g q 8 hr EI infusion   Temp (24hrs), Avg:98.2 F (36.8 C), Min:97.8 F (36.6 C), Max:98.6 F (37 C)   Recent Labs Lab 04/25/16 1632 04/26/16 0500 04/27/16 0545 04/28/16 1002 04/28/16 1003  WBC 2.1* 1.8* 2.9* 3.9  --   CREATININE 0.72 0.75 0.82 0.73  --   LATICACIDVEN 1.3  --   --   --   --   VANCOTROUGH  --   --   --   --  7*    Estimated Creatinine Clearance: 55.8 mL/min (by C-G formula based on SCr of 0.73 mg/dL).    No Known Allergies  Antimicrobials this admission: Vancomycin  10/16 >>  Cefepime  10/16 >> 10/17 Acyclovir 10/16 >>  Zosyn 10/17>> Azithromycin 10/17>>  Dose adjustments this admission:  Microbiology results: 10/16 BCx: Sent  Thank you for allowing pharmacy to be a part of this patient's care.  Ramond Dial, PharmD Clinical Pharmacist 04/28/2016 12:20 PM

## 2016-04-28 NOTE — Evaluation (Addendum)
Clinical/Bedside Swallow Evaluation Patient Details  Name: Francisco [name] MRN: [id number] Date of Birth: 1946-05-06  Today's Date: 04/28/2016 Time: SLP Start Time (ACUTE ONLY): 1400 SLP Stop Time (ACUTE ONLY): 1500 SLP Time Calculation (min) (ACUTE ONLY): 60 min  Past Medical History:  Past Medical History:  Diagnosis Date  . Anemia   . Cancer (HCC)    Bone metastasis  . Constipation   . Hearing loss   . Hoarse voice quality   . Liver lesion   . Malaria 2003  . Multiple myeloma ([place]) 03/22/2016   Per Dr. Rogue [name] on his PET order.  . Tuberculosis 1985   Past Surgical History:  Past Surgical History:  Procedure Laterality Date  . RETINAL DETACHMENT SURGERY  2006   HPI:  Pt is a 70 y.o. male with a past medical history significant for multiple myeloma and ongoing Radiation tx who presents to the hospital with complaints of altered mental status, somnolence, shortness of breath, shakes. According to patient's family, patient had chemotherapy last Thursday, about 3 days ago, he is been suffering with diarrhea for the past 2 days, not eating, just few spoons, today at 2 AM he developed shortness of breath, shakiness, and was very drowsy and was brought to emergency room for further evaluation and treatment. In the emergency room he was noted to have tachypnea, tachycardia, history is x-ray was concerning for pneumonia, labs revealed pancytopenia. Pt recently admitted to this hospital and was seen for evaluation x1 when he expericened complaints of recurrent nausea and vomiting. The patient recently underwent chemotherapy and received intravenous fluid in clinic. He tries to eat at home but vomits shortly after swallowing. The patient admitted that he had difficulty swallowing and that his throat is sore at that time. Due to clinical dehydration and intractable nausea and vomiting the emergency department staff called for admission. Per report from staff/family member Pt has experienced  significant weight loss in the past 4-6 weeks. Pt has been eating some food and drinking sips per report by family, but Pt usually experiences nausea and vomiting - this could be adding to cause of sore throat(vomiting). Family and Pt denied choking or coughing with POs during last admission and this admission. Pt is eating mostly soups his family prepares for him; some mashed foods.  Addendum notation: upon reviewing Imaging notes, a CT of chest noted Esophagus dilated by air and fluid. This can significantly impact Esophageal motility, reduced bolus clearing distally, and increase risk for retrograde activity of bolus material thus increasing risk for Reflux aspiration. MD/NSG updated.  Assessment / Plan / Recommendation Clinical Impression  Pt appeared to adequately tolerate (and swallow) PO trials of thin and nectar liquids with no immediate, overt s/s of aspiration. Pt hiccups minimized after swallowing small sips of thin and nectar liquids from a cup - he was able to hold this himself w/ only min support needed d/t shakiness. Pt does experience esophageal phase discomfort (hiccups) due to recent Chemo and Radiation therapy and swallowing of extra air with the liquid trials (aerophagia). Pt has stated small bites of solid foods(mashed by family - puree?) give him less discomfort w/ the hiccips than the liquids, and following the hiccups he experiences feelings of nausea. After a few trials taken, pt declined further d/t ready to receive his scheduled Morphine - pt does become drowsy and this could increase risk for aspiration. Pt appears to exhibit reduced risk for aspiration following general aspiration precautions including feeding self by holding cup when drinking and  sitting fully upright during oral intake and AFTER oral intake for Reflux precautions. ST services recommends a softened foods diet (level 1 and 2) with Thin liquids, foods and liquids at the discretion and comfort of the Pt, following  aspiration precautions. Dietician if following. Reccomend follow up with GI services due to apparent esophageal discomfort and consistent N/V in order to better address nutrition needs; TPN is ongoing. Nursing and MD consulted and agree with recommendation.     Aspiration Risk   (reduced following aspiration precautions)    Diet Recommendation  Dysphagia level 2 and 1; thin liquids; aspiration precautions; reflux precautions  Medication Administration: Crushed with puree (as needed vs whole - for easier swallowing)    Other  Recommendations Recommended Consults: Consider GI evaluation Oral Care Recommendations: Oral care BID;Staff/trained caregiver to provide oral care   Follow up Recommendations None      Frequency and Duration min 2x/week  1 week       Prognosis Prognosis for Safe Diet Advancement: Good (-Fair)      Swallow Study   General Date of Onset: 04/25/16 HPI: Pt is a 70 y.o. male with a past medical history significant for multiple myeloma and ongoing Radiation tx who presents to the hospital with complaints of altered mental status, somnolence, shortness of breath, shakes. According to patient's family, patient had chemotherapy last Thursday, about 3 days ago, he is been suffering with diarrhea for the past 2 days, not eating, just few spoons, today at 2 AM he developed shortness of breath, shakiness, and was very drowsy and was brought to emergency room for further evaluation and treatment. In the emergency room he was noted to have tachypnea, tachycardia, history is x-ray was concerning for pneumonia, labs revealed pancytopenia. Pt recently admitted to this hospital and was seen for evaluation x1 when he expericened complaints of recurrent nausea and vomiting. The patient recently underwent chemotherapy and received intravenous fluid in clinic. He tries to eat at home but vomits shortly after swallowing. The patient admitted that he had difficulty swallowing and that his  throat is sore at that time. Due to clinical dehydration and intractable nausea and vomiting the emergency department staff called for admission. Per report from staff/family member Pt has experienced significant weight loss in the past 4-6 weeks. Pt has been eating some food and drinking sips per report by family, but Pt usually experiences nausea and vomiting - this could be adding to cause of sore throat(vomiting). Family and Pt denied choking or coughing with POs during last admission and this admission. Pt is eating mostly soups his family prepares for him; some mashed foods.  Type of Study: Bedside Swallow Evaluation Previous Swallow Assessment: last admission; no gross oropharyngeal phase dysphagia was noted then Diet Prior to this Study: Dysphagia 1 (puree);Dysphagia 2 (chopped);Thin liquids (since last admission) Temperature Spikes Noted: No (wbc not elevated) Respiratory Status: Room air History of Recent Intubation: No Behavior/Cognition: Alert;Cooperative;Pleasant mood;Requires cueing (min; wanted to do for himself) Oral Cavity Assessment: Dry Oral Care Completed by SLP: Recent completion by staff Oral Cavity - Dentition: Missing dentition Vision: Functional for self-feeding Self-Feeding Abilities: Able to feed self;Needs assist;Needs set up Patient Positioning: Upright in bed Baseline Vocal Quality: Low vocal intensity (gravely) Volitional Cough: Strong Volitional Swallow: Able to elicit    Oral/Motor/Sensory Function Overall Oral Motor/Sensory Function: Within functional limits   Ice Chips Ice chips: Not tested   Thin Liquid Thin Liquid: Within functional limits Presentation: Cup;Self Fed (5 trials) Other Comments: the  hiccups minimized since taking po soup (slightly thicker); no N/V    Nectar Thick Nectar Thick Liquid: Within functional limits Presentation: Spoon (fed; 3 trials)   Honey Thick Honey Thick Liquid: Not tested   Puree Puree: Not tested Other Comments: pt  declined   Solid   GO   Solid: Not tested Other Comments: pt declined         Orinda Kenner, MS, CCC-SLP Sonia Bromell 04/28/2016,3:07 PM

## 2016-04-28 NOTE — Consult Note (Signed)
PHARMACY - ADULT TOTAL PARENTERAL NUTRITION CONSULT NOTE   Pharmacy Consult for TPN Indication:Severe malnutrition in context of chronic illness  Patient Measurements: Height: 5\' 10"  (177.8 cm) Weight: 102 lb 11.2 oz (46.6 kg) IBW/kg (Calculated) : 73 TPN AdjBW (KG): 42.7 Body mass index is 14.74 kg/m. Usual Weight:   Assessment:   GI: not a candidate for tube placement, at least at this time Endo: Insulin requirements in the past 24 hours:0 units Lytes:k=3.5, Mg=2.3, phos=1.9 Renal: Pulm: possible aspiration PNA on azithro, zosyn, vanc Cards: Hepatobil: Neuro: ID: possible aspiration PNA on azithro, zosyn, vanc  Best Practices: TPN Access:PICC TPN start date: started at previous admission this month, restarted 10/18  Nutritional Goals (per RD recommendation on 10/18): 1590 kcal, 66 grams protein, 1320 ml fluid with total calories from lipids averaged over 7 days   Current Nutrition:   Plan:  Continue Clinimix 5/20 at 74ml/hr (Na still high)- goal rate 88ml/hr Continue to monitor K, phos, Mg closley as pt is at risk for refeeding.  Will give Kphos IV 60mmol once due to low phos. Recheck phos this evening.All electrolytes in the AM Strict I/O, daily weights, CBG q 4hr checks with sensitive SSI ordered.  D/C NS and start D5W @ 69ml/hr  10/19 PM PO4 2.3. Potassium phosphate order from earlier is still running per RN. Recheck ordered with tomorrow AM labs.  Ramond Dial, Pharm.D Clinical Pharmacist  04/28/2016,11:08 PM

## 2016-04-28 NOTE — Progress Notes (Signed)
[facility] PROGRESS NOTE Date of Admission:  04/25/2016     ID: Francisco [name] is a 70 y.o. male with probable aspiration PNA, hx treated TB, Multiple myeloma and FTT Active Problems:   Pancytopenia (HCC)   Metabolic encephalopathy   Healthcare-associated pneumonia   Elevated transaminase level   SIRS (systemic inflammatory response syndrome) (HCC)   Protein-calorie malnutrition, severe   Subjective: No fevers, very weak. Asking for food.   ROS  Eleven systems are reviewed and negative except per hpi  Medications:  Antibiotics Given (last 72 hours)    Date/Time Action Medication Dose Rate   04/25/16 1800 Given   ceFEPIme (MAXIPIME) 1 GM / 73m IVPB premix 1 g 100 mL/hr   04/25/16 2136 Given   acyclovir (ZOVIRAX) 200 MG capsule 400 mg 400 mg    04/25/16 2258 Given   ceFEPIme (MAXIPIME) 2 GM / 530mIVPB premix 2 g 100 mL/hr   04/26/16 1127 Given   ceFEPIme (MAXIPIME) 2 GM / 5041mVPB premix 2 g 100 mL/hr   04/26/16 1156 Given   vancomycin (VANCOCIN) IVPB 750 mg/150 ml premix 750 mg 150 mL/hr   04/26/16 2030 Given   azithromycin (ZITHROMAX) tablet 500 mg 500 mg    04/26/16 2300 Given   piperacillin-tazobactam (ZOSYN) IVPB 3.375 g 3.375 g 12.5 mL/hr   04/27/16 0548 Given   piperacillin-tazobactam (ZOSYN) IVPB 3.375 g 3.375 g 12.5 mL/hr   04/27/16 0930 Given   vancomycin (VANCOCIN) IVPB 750 mg/150 ml premix 750 mg 150 mL/hr   04/27/16 1455 Given   piperacillin-tazobactam (ZOSYN) IVPB 3.375 g 3.375 g 12.5 mL/hr   04/27/16 1900 Given   azithromycin (ZITHROMAX) 500 mg in dextrose 5 % 250 mL IVPB 500 mg 250 mL/hr   04/27/16 2338 Given   piperacillin-tazobactam (ZOSYN) IVPB 3.375 g 3.375 g 12.5 mL/hr   04/28/16 0519 Given   piperacillin-tazobactam (ZOSYN) IVPB 3.375 g 3.375 g 12.5 mL/hr     . albuterol  2.5 mg Nebulization TID  . azithromycin  500 mg Intravenous Q24H  . docusate sodium  100 mg Oral BID  . famotidine  20 mg Oral BID  . insulin  aspart  0-9 Units Subcutaneous Q4H  . mouth rinse  15 mL Mouth Rinse BID  . piperacillin-tazobactam (ZOSYN)  IV  3.375 g Intravenous Q8H  . potassium phosphate IVPB (mmol)  30 mmol Intravenous Once  . sodium chloride flush  3 mL Intravenous Q12H  . tiotropium  18 mcg Inhalation Daily  . vancomycin  1,000 mg Intravenous Q24H    Objective: Vital signs in last 24 hours: Temp:  [97.8 F (36.6 C)-98.6 F (37 C)] 98.5 F (36.9 C) (10/19 0910) Pulse Rate:  [70-100] 70 (10/19 0910) Resp:  [19-20] 20 (10/19 0910) BP: (126-135)/(74-82) 131/74 (10/19 0910) SpO2:  [95 %-99 %] 96 % (10/19 0910) Weight:  [46.6 kg (102 lb 11.2 oz)] 46.6 kg (102 lb 11.2 oz) (10/19 0500) Constitutional: ill appearing, frail, cachectic HENT: anicteric, pale Mouth/Throat: Oropharynx is clear and dry . No oropharyngeal exudate.  Cardiovascular: Normal rate, regular rhythm and normal heart sounds. Exam reveals no gallop and no friction rub.  No murmur heard.  Pulmonary/Chest: bil rhonchi, poor resp effort Abdominal: Soft. Bowel sounds are normal. He exhibits no distension. There is no tenderness.  Lymphadenopathy: He has no cervical adenopathy.  Neurological: lethargic Skin: Skin is warm and dry. No rash noted. No erythema.  Psychiatric:lethargic  Lab Results  Recent Labs  04/27/16 0545 04/28/16 1002  WBC 2.9* 3.9  HGB 8.8* 8.3*  HCT 25.8* 25.1*  NA 153* 153*  K 3.9 3.5  CL 126* 127*  CO2 23 23  BUN 34* 28*  CREATININE 0.82 0.73    Microbiology: Results for orders placed or performed during the hospital encounter of 04/25/16  Blood culture (routine x 2)     Status: None (Preliminary result)   Collection Time: 04/25/16  4:32 PM  Result Value Ref Range Status   Specimen Description BLOOD  Final   Special Requests NONE  Final   Culture NO GROWTH 3 DAYS  Final   Report Status PENDING  Incomplete  Blood culture (routine x 2)     Status: None (Preliminary result)   Collection Time: 04/25/16  4:32 PM   Result Value Ref Range Status   Specimen Description BLOOD  Final   Special Requests NONE  Final   Culture NO GROWTH 3 DAYS  Final   Report Status PENDING  Incomplete  MRSA PCR Screening     Status: None   Collection Time: 04/26/16 11:35 AM  Result Value Ref Range Status   MRSA by PCR NEGATIVE NEGATIVE Final    Comment:        The GeneXpert MRSA Assay (FDA approved for NASAL specimens only), is one component of a comprehensive MRSA colonization surveillance program. It is not intended to diagnose MRSA infection nor to guide or monitor treatment for MRSA infections.     Studies/Results: Ct Chest Wo Contrast  Result Date: 04/27/2016 CLINICAL DATA:  New diagnosis of multiple myeloma with bony lesions, on chemotherapy and radiation therapy. Admitted with progressive weakness, shortness of breath, pancytopenia, history TB. EXAM: CT ANGIOGRAPHY CHEST WITH CONTRAST TECHNIQUE: Multidetector CT imaging of the chest was performed using the standard protocol during bolus administration of intravenous contrast. Multiplanar CT image reconstructions and MIPs were obtained to evaluate the vascular anatomy. Additional precontrast images were obtained. CONTRAST:  75 cc Isovue 370 IV. COMPARISON:  PET-CT 03/28/2016, chest radiograph 04/25/2016 FINDINGS: Cardiovascular: Atherosclerotic calcifications aorta without aneurysm or dissection. Pulmonary arteries well opacified and patent. No evidence of pulmonary embolism. No pericardial effusion. Mediastinum/Nodes: Esophagus dilated by air and fluid. No definite thoracic adenopathy. Base of cervical region unremarkable. Lungs/Pleura: Lungs appear emphysematous. Slightly nodular peribronchovascular infiltrates in both lower lobes with dense consolidation of a large portion of LEFT lower lobe as well. Minimal LEFT upper lobe infiltrate as well. Scattered areas of subpleural thickening and scarring. No definite pleural effusion or pneumothorax. Upper Abdomen:  Single low-attenuation lesion centrally in liver 13 x 10 mm diameter image 78, question cyst. Remaining visualized upper abdomen unremarkable. Musculoskeletal: Expansile destructive lesion of the posterior LEFT ninth rib. Diffuse osseous demineralization. Additional scattered areas of cortical thinning and destruction in BILATERAL ribs. Lytic lesion at T3 vertebral body. Compression fractures at T4, T9, and T10, suspected to be pathologic. Review of the MIP images confirms the above findings. IMPRESSION: No evidence of pulmonary embolism. Aortic atherosclerosis. BILATERAL lower lobe infiltrates LEFT greater than RIGHT. Scattered bone lesions with thoracic spine compression fractures at T4, T9, and T10. Electronically Signed   By: Lavonia Dana M.D.   On: 04/26/2016 18:06   Ct Angio Chest Pe W Or Wo Contrast  Result Date: 04/27/2016 CLINICAL DATA:  New diagnosis of multiple myeloma with bony lesions, on chemotherapy and radiation therapy. Admitted with progressive weakness, shortness of breath, pancytopenia, history TB. EXAM: CT ANGIOGRAPHY CHEST WITH CONTRAST TECHNIQUE: Multidetector CT imaging of the chest was performed using the  standard protocol during bolus administration of intravenous contrast. Multiplanar CT image reconstructions and MIPs were obtained to evaluate the vascular anatomy. Additional precontrast images were obtained. CONTRAST:  75 cc Isovue 370 IV. COMPARISON:  PET-CT 03/28/2016, chest radiograph 04/25/2016 FINDINGS: Cardiovascular: Atherosclerotic calcifications aorta without aneurysm or dissection. Pulmonary arteries well opacified and patent. No evidence of pulmonary embolism. No pericardial effusion. Mediastinum/Nodes: Esophagus dilated by air and fluid. No definite thoracic adenopathy. Base of cervical region unremarkable. Lungs/Pleura: Lungs appear emphysematous. Slightly nodular peribronchovascular infiltrates in both lower lobes with dense consolidation of a large portion of LEFT lower  lobe as well. Minimal LEFT upper lobe infiltrate as well. Scattered areas of subpleural thickening and scarring. No definite pleural effusion or pneumothorax. Upper Abdomen: Single low-attenuation lesion centrally in liver 13 x 10 mm diameter image 78, question cyst. Remaining visualized upper abdomen unremarkable. Musculoskeletal: Expansile destructive lesion of the posterior LEFT ninth rib. Diffuse osseous demineralization. Additional scattered areas of cortical thinning and destruction in BILATERAL ribs. Lytic lesion at T3 vertebral body. Compression fractures at T4, T9, and T10, suspected to be pathologic. Review of the MIP images confirms the above findings. IMPRESSION: No evidence of pulmonary embolism. Aortic atherosclerosis. BILATERAL lower lobe infiltrates LEFT greater than RIGHT. Scattered bone lesions with thoracic spine compression fractures at T4, T9, and T10. Electronically Signed   By: Lavonia Dana M.D.   On: 04/26/2016 18:06    Assessment/Plan: Francisco [name] is a 70 y.o. male with new dx of myeloma with diffuse bony lesions, on chemo and XRT admitted with progressive weakness, sob, pancytopenia. He has hx TB in 1985 reportedly treated in 1985 in [place].  His CT done a month ago shows chronic scarring or upper lobes, but CXR now shows some progressive airspace disease.  Interestingly he also has some hoarseness.  CT shows findings of likely repeat aspiration and he has air and fluid distention of his esophagus.  I think TB is unlikely but possible in this immunocompromised patient. He has been unable to produce sputum   Recommendations If cannot produce induced sputum by tomorrow can dc isolation Cont zosyn DC vanco as MRSA PCR neg  Thank you very much for the consult. Will follow with you.  [place], Tangia Pinard P   04/28/2016, 1:07 PM

## 2016-04-29 ENCOUNTER — Inpatient Hospital Stay: Payer: Medicare Other

## 2016-04-29 ENCOUNTER — Ambulatory Visit: Payer: Medicare Other

## 2016-04-29 DIAGNOSIS — D701 Agranulocytosis secondary to cancer chemotherapy: Secondary | ICD-10-CM

## 2016-04-29 DIAGNOSIS — R4182 Altered mental status, unspecified: Secondary | ICD-10-CM | POA: Diagnosis not present

## 2016-04-29 DIAGNOSIS — R066 Hiccough: Secondary | ICD-10-CM

## 2016-04-29 DIAGNOSIS — A419 Sepsis, unspecified organism: Secondary | ICD-10-CM | POA: Diagnosis not present

## 2016-04-29 DIAGNOSIS — D6481 Anemia due to antineoplastic chemotherapy: Secondary | ICD-10-CM

## 2016-04-29 DIAGNOSIS — R531 Weakness: Secondary | ICD-10-CM

## 2016-04-29 DIAGNOSIS — C9000 Multiple myeloma not having achieved remission: Secondary | ICD-10-CM | POA: Diagnosis not present

## 2016-04-29 DIAGNOSIS — J189 Pneumonia, unspecified organism: Secondary | ICD-10-CM | POA: Diagnosis not present

## 2016-04-29 DIAGNOSIS — T451X5S Adverse effect of antineoplastic and immunosuppressive drugs, sequela: Secondary | ICD-10-CM

## 2016-04-29 LAB — CBC WITH DIFFERENTIAL/PLATELET
BAND NEUTROPHILS: 6 %
BASOS PCT: 0 %
Basophils Absolute: 0 10*3/uL (ref 0–0.1)
Blasts: 0 %
EOS ABS: 0 10*3/uL (ref 0–0.7)
EOS PCT: 1 %
HCT: 23.7 % — ABNORMAL LOW (ref 40.0–52.0)
Hemoglobin: 7.9 g/dL — ABNORMAL LOW (ref 13.0–18.0)
LYMPHS ABS: 0.4 10*3/uL — AB (ref 1.0–3.6)
Lymphocytes Relative: 8 %
MCH: 28.7 pg (ref 26.0–34.0)
MCHC: 33.4 g/dL (ref 32.0–36.0)
MCV: 85.9 fL (ref 80.0–100.0)
METAMYELOCYTES PCT: 2 %
MONO ABS: 0.4 10*3/uL (ref 0.2–1.0)
MYELOCYTES: 0 %
Monocytes Relative: 9 %
NEUTROS PCT: 74 %
NRBC: 2 /100{WBCs} — AB
Neutro Abs: 3.8 10*3/uL (ref 1.4–6.5)
Other: 0 %
PLATELETS: 22 10*3/uL — AB (ref 150–440)
PROMYELOCYTES ABS: 0 %
RBC: 2.76 MIL/uL — ABNORMAL LOW (ref 4.40–5.90)
RDW: 18.6 % — AB (ref 11.5–14.5)
WBC: 4.6 10*3/uL (ref 3.8–10.6)

## 2016-04-29 LAB — EXPECTORATED SPUTUM ASSESSMENT W GRAM STAIN, RFLX TO RESP C

## 2016-04-29 LAB — GLUCOSE, CAPILLARY
GLUCOSE-CAPILLARY: 104 mg/dL — AB (ref 65–99)
GLUCOSE-CAPILLARY: 124 mg/dL — AB (ref 65–99)
Glucose-Capillary: 109 mg/dL — ABNORMAL HIGH (ref 65–99)
Glucose-Capillary: 133 mg/dL — ABNORMAL HIGH (ref 65–99)
Glucose-Capillary: 153 mg/dL — ABNORMAL HIGH (ref 65–99)

## 2016-04-29 LAB — MAGNESIUM: MAGNESIUM: 1.9 mg/dL (ref 1.7–2.4)

## 2016-04-29 LAB — COMPREHENSIVE METABOLIC PANEL
ALT: 61 U/L (ref 17–63)
AST: 41 U/L (ref 15–41)
Albumin: 1.8 g/dL — ABNORMAL LOW (ref 3.5–5.0)
Alkaline Phosphatase: 164 U/L — ABNORMAL HIGH (ref 38–126)
Anion gap: 3 — ABNORMAL LOW (ref 5–15)
BUN: 23 mg/dL — ABNORMAL HIGH (ref 6–20)
CHLORIDE: 123 mmol/L — AB (ref 101–111)
CO2: 23 mmol/L (ref 22–32)
Calcium: 7.5 mg/dL — ABNORMAL LOW (ref 8.9–10.3)
Creatinine, Ser: 0.7 mg/dL (ref 0.61–1.24)
Glucose, Bld: 119 mg/dL — ABNORMAL HIGH (ref 65–99)
POTASSIUM: 3.5 mmol/L (ref 3.5–5.1)
SODIUM: 149 mmol/L — AB (ref 135–145)
Total Bilirubin: 0.4 mg/dL (ref 0.3–1.2)
Total Protein: 4.9 g/dL — ABNORMAL LOW (ref 6.5–8.1)

## 2016-04-29 LAB — KAPPA/LAMBDA LIGHT CHAINS
KAPPA, LAMDA LIGHT CHAIN RATIO: 8.22 — AB (ref 0.26–1.65)
Kappa free light chain: 44.4 mg/L — ABNORMAL HIGH (ref 3.3–19.4)
Lambda free light chains: 5.4 mg/L — ABNORMAL LOW (ref 5.7–26.3)

## 2016-04-29 LAB — PHOSPHORUS
PHOSPHORUS: 2.1 mg/dL — AB (ref 2.5–4.6)
PHOSPHORUS: 3.5 mg/dL (ref 2.5–4.6)

## 2016-04-29 LAB — EXPECTORATED SPUTUM ASSESSMENT W REFEX TO RESP CULTURE

## 2016-04-29 LAB — PREPARE RBC (CROSSMATCH)

## 2016-04-29 LAB — PATHOLOGIST SMEAR REVIEW

## 2016-04-29 MED ORDER — SODIUM CHLORIDE 0.9% FLUSH: 3.0000 mL | INTRAVENOUS | Status: DC | PRN

## 2016-04-29 MED ORDER — HEPARIN SOD (PORK) LOCK FLUSH 100 UNIT/ML IV SOLN: 500.0000 [IU] | Freq: Every day | INTRAVENOUS | Status: DC | PRN

## 2016-04-29 MED ORDER — SODIUM CHLORIDE 0.9% FLUSH
10.0000 mL | INTRAVENOUS | Status: AC | PRN
  Administered 2016-05-02: 10:00:00 10 mL

## 2016-04-29 MED ORDER — HEPARIN SOD (PORK) LOCK FLUSH 100 UNIT/ML IV SOLN: 250.0000 [IU] | INTRAVENOUS | Status: DC | PRN

## 2016-04-29 MED ORDER — TRACE MINERALS CR-CU-MN-SE-ZN 10-1000-500-60 MCG/ML IV SOLN
INTRAVENOUS | Status: AC
  Administered 2016-04-29: 17:00:00 via INTRAVENOUS
  Filled 2016-04-29: qty 1320

## 2016-04-29 MED ORDER — ACETAMINOPHEN 325 MG PO TABS
650.0000 mg | ORAL_TABLET | Freq: Once | ORAL | Status: AC
  Administered 2016-04-29: 650 mg via ORAL
  Filled 2016-04-29: qty 2

## 2016-04-29 MED ORDER — METHYLPREDNISOLONE SODIUM SUCC 125 MG IJ SOLR
60.0000 mg | Freq: Two times a day (BID) | INTRAMUSCULAR | Status: DC
  Administered 2016-04-29 – 2016-04-30 (×3): 60 mg via INTRAVENOUS
  Filled 2016-04-29 (×4): qty 2

## 2016-04-29 MED ORDER — POTASSIUM PHOSPHATES 15 MMOLE/5ML IV SOLN
30.0000 mmol | Freq: Once | INTRAVENOUS | Status: AC
  Administered 2016-04-29: 11:00:00 30 mmol via INTRAVENOUS
  Filled 2016-04-29: qty 10

## 2016-04-29 MED ORDER — AZITHROMYCIN 500 MG PO TABS
500.0000 mg | ORAL_TABLET | ORAL | Status: DC
  Administered 2016-04-29: 500 mg via ORAL
  Filled 2016-04-29: qty 1

## 2016-04-29 MED ORDER — SODIUM CHLORIDE 0.9 % IV SOLN
250.0000 mL | Freq: Once | INTRAVENOUS | Status: AC
  Administered 2016-04-29: 11:00:00 250 mL via INTRAVENOUS

## 2016-04-29 MED ORDER — FAMOTIDINE IN NACL 20-0.9 MG/50ML-% IV SOLN
20.0000 mg | Freq: Two times a day (BID) | INTRAVENOUS | Status: DC
  Administered 2016-04-29 – 2016-05-03 (×6): 20 mg via INTRAVENOUS
  Filled 2016-04-29 (×11): qty 50

## 2016-04-29 MED ORDER — DIPHENHYDRAMINE HCL 25 MG PO CAPS
25.0000 mg | ORAL_CAPSULE | Freq: Once | ORAL | Status: AC
  Administered 2016-04-29: 25 mg via ORAL
  Filled 2016-04-29: qty 1

## 2016-04-29 MED ORDER — ALBUTEROL SULFATE (2.5 MG/3ML) 0.083% IN NEBU: 2.5000 mg | INHALATION_SOLUTION | RESPIRATORY_TRACT | Status: DC | PRN

## 2016-04-29 NOTE — Consult Note (Signed)
PHARMACY - ADULT TOTAL PARENTERAL NUTRITION CONSULT NOTE   Pharmacy Consult for TPN Indication:Severe malnutrition in context of chronic illness  Patient Measurements: Height: 5\' 10"  (177.8 cm) Weight: 107 lb 11.2 oz (48.9 kg) (pt had 1 pillow and multiple blankets under him and on him) IBW/kg (Calculated) : 73 TPN AdjBW (KG): 42.7 Body mass index is 15.45 kg/m. Usual Weight:   Assessment:   GI: not a candidate for tube placement, at least at this time Endo: Insulin requirements in the past 24 hours:0 units Lytes:k=3.5, Mg=1.9, phos=2.1, corrected ca=9.26, Na=149 Renal: Pulm: possible aspiration PNA on azithro, zosyn,  Cards: Hepatobil: Neuro: ID: possible aspiration PNA on azithro, zosyn,   Best Practices: TPN Access:PICC TPN start date: started at previous admission this month, restarted 10/18  Nutritional Goals (per RD recommendation on 10/18): 1590 kcal, 66 grams protein, 1320 ml fluid with total calories from lipids averaged over 7 days   Current Nutrition:   Plan:  increase Clinimix 5/20 to goal rate 45ml/hr Continue to monitor K, phos, Mg closley as pt is at risk for refeeding.  Will give Kphos IV 36mmol once due to low phos. Recheck phos this evening. May require additional supplementation. All electrolytes in the AM Strict I/O, daily weights, CBG q 4hr checks with sensitive SSI ordered.   Ramond Dial, Pharm.D Clinical Pharmacist  04/29/2016,7:45 AM

## 2016-04-29 NOTE — Progress Notes (Signed)
Dr Rogue Bussing notified that patient will not have MRI until tomorrow morning.

## 2016-04-29 NOTE — Progress Notes (Signed)
Francisco [name]   DOB:1946/02/21   BH#:[id number]    Subjective: Patient resting comfortably. He states that able to swallow better; he had sips of fluid and semisolid food. No cough. Intermittent hiccups. He still feels weak; unable to ambulate by himself.  Objective:  Vitals:   04/29/16 1411 04/29/16 1606  BP: 129/76 122/71  Pulse: 66 68  Resp: (!) 22 20  Temp: 98.7 F (37.1 C) 98.9 F (37.2 C)     Intake/Output Summary (Last 24 hours) at 04/29/16 1752 Last data filed at 04/29/16 1715  Gross per 24 hour  Intake             2625 ml  Output                0 ml  Net             2625 ml    GENERAL Alert, no distress and comfortable.  Cachectic appearing. Accompanied by multiple family members EYES: no pallor or icterus OROPHARYNX: no thrush or ulceration. NECK: supple, no masses felt LYMPH:  no palpable lymphadenopathy in the cervical, axillary or inguinal regions LUNGS: decreased breath sounds to auscultation at bases and  No wheeze or crackles HEART/CVS: regular rate & rhythm and no murmurs; No lower extremity edema ABDOMEN: abdomen soft, tender  on deep palpation. and normal bowel sounds Musculoskeletal:no cyanosis of digits and no clubbing  PSYCH: alert & oriented x 3 with fluent speech NEURO: no focal motor/sensory deficits SKIN:  no rashes or significant lesions   Labs:  Lab Results  Component Value Date   WBC 4.6 04/29/2016   HGB 7.9 (L) 04/29/2016   HCT 23.7 (L) 04/29/2016   MCV 85.9 04/29/2016   PLT 22 (LL) 04/29/2016   NEUTROABS 3.8 04/29/2016    Lab Results  Component Value Date   NA 149 (H) 04/29/2016   K 3.5 04/29/2016   CL 123 (H) 04/29/2016   CO2 23 04/29/2016    Studies:  Dg Chest 2 View  Result Date: 04/29/2016 CLINICAL DATA:  Cough.  Possible aspiration.  Multiple myeloma. EXAM: CHEST  2 VIEW COMPARISON:  04/25/2016 FINDINGS: Right arm PICC line remains in appropriate position. Heart size is within normal limits. Pulmonary hyperinflation  remains stable. No evidence of pneumothorax. Airspace disease with air bronchograms again seen in the medial left lower lobe, suspicious for pneumonia. There is also mild symmetric bilateral perihilar airspace disease, suspicious for mild pulmonary edema. Small layering posterior pleural effusion noted. Pulmonary hyperinflation again demonstrated. IMPRESSION: Persistent medial left lower lobe airspace disease and tiny posterior pleural effusion, suspicious for pneumonia. Mild symmetric bilateral perihilar airspace opacity, suspicious for pulmonary edema. Stable cardiomegaly and pulmonary hyperinflation. Electronically Signed   By: Earle Gell M.D.   On: 04/29/2016 16:47   Dg Abd 1 View  Result Date: 04/29/2016 CLINICAL DATA:  Pt has abdominal distention with complaints of some pain. Nonsmoker. Hx of TB. Hx od constipation. Additional history of multiple myeloma for clinical data provided on PET-CT report of 03/29/2016. EXAM: ABDOMEN - 1 VIEW COMPARISON:  None. FINDINGS: Overall bowel gas pattern is nonobstructive. Fairly large amount of gas within the colon. Questionable thickening of the walls of a small bowel loop in the pelvis. No evidence of soft tissue mass or abnormal fluid collection. No evidence of free intraperitoneal air. No pathologic appearing calcifications seen. IMPRESSION: Nonobstructive bowel gas pattern. Fairly large amount of gas within the colon. Questionable thickening of the walls of a small bowel loop in the  pelvis (enteritis? ). Electronically Signed   By: Franki Cabot M.D.   On: 04/29/2016 08:59    Assessment & Plan:   # 70 year old male patient history of multiple myeloma currently admitted to hospital for poor by mouth intake/increasing shortness of breath/altered mental status.   # Multiple myeloma status post cycle # cycle #2 day 8-of Velcade/Revlimid 20 mg once a day for one week. Treatment can done on hold because of acute respiratory issues. Awaiting on M protein; kappa  lambda light chain ratio-improving. We'll start patient on IV Solu-Medrol 60 mg twice a day 3 days. We will also will add pepcid IV BID.   # Anemia and thrombocytopenia- from Revlimid/treatment. Platelets 22 status post transfusion. Hemoglobin 7.9 status post wide of PRBC transfusion.  # Poor by mouth intake/esophageal dilatation noted on the CT scan.  Continue TPN for now. Will need upper endoscopy when medically stable/also with improvement of platelets. I clinically think patients platelets should improve in the next few days.  # Pneumonia/possibly aspiration- currently on antibiotics as per ID.   # Above plan discussed with the patient's family in detail. Dr.Perumandla will be covering over the weekend.   Cammie Sickle, MD 04/29/2016  5:52 PM

## 2016-04-29 NOTE — Progress Notes (Signed)
Nutrition Follow-up  DOCUMENTATION CODES:   Severe malnutrition in context of chronic illness  INTERVENTION:  -Recommend increasing TPN of 5/20 to goal rate of 25ml/hr starting at 1800 tonight.  Do not recommend adding lipids until next week and pt tolerating TPN at goal rate.  Will likely need to continue IV fluids until Na corrected.  Discussed with pharmacist, Lenna Sciara.   NUTRITION DIAGNOSIS:   Malnutrition related to chronic illness, cancer and cancer related treatments as evidenced by severe depletion of body fat, severe depletion of muscle mass, percent weight loss.  ongoing  GOAL:   Patient will meet greater than or equal to 90% of their needs  Improving with increasing TPN to goal rate  MONITOR:   PO intake, Supplement acceptance, Labs, Weight trends  REASON FOR ASSESSMENT:   Malnutrition Screening Tool, Consult Assessment of nutrition requirement/status  ASSESSMENT:    TPN infusing via right PICC line at 30ml/hr  Medications reviewed: D5 at 78ml/hr Labs reviewed: Na 149, BUN 23, creatinine WNL, Phosphorus 2.1, Mag WNL  Diet Order:  Diet regular Room service appropriate? Yes; Fluid consistency: Thin .TPN (CLINIMIX-E) Adult .TPN (CLINIMIX-E) Adult  Skin:  Reviewed, no issues  Last BM:  10/15  Height:   Ht Readings from Last 1 Encounters:  04/25/16 5\' 10"  (1.778 m)    Weight:   Wt Readings from Last 1 Encounters:  04/29/16 107 lb 11.2 oz (48.9 kg)    Ideal Body Weight:     BMI:  Body mass index is 15.45 kg/m.  Estimated Nutritional Needs:   Kcal:  1300-1500 kcals/d  Protein:  52-65 g/d  Fluid:  >/= 1.3 L/c  EDUCATION NEEDS:   Education needs addressed  Charyl Minervini B. Zenia Resides, [place], [place] (pager) Weekend/On-Call pager [phone])

## 2016-04-29 NOTE — Progress Notes (Signed)
Notified Dr Rogue Bussing that patient temp was 99 before transfusion. MD acknowledged, no new orders given.

## 2016-04-29 NOTE — Progress Notes (Signed)
[facility] [place] NAME: Francisco [name]    MR#:  [id number]  DATE OF BIRTH:  1946/05/09  SUBJECTIVE:  CHIEF COMPLAINT:   Chief Complaint  Patient presents with  . Altered Mental Status     Diagnosed with metastatic multiple myeloma a few months ago and have decreased oral intake with nausea and severe pain and have severe malnutrition. He was recently started on TPN 2 weeks ago, but had worsening in her overall condition and brought back with altered mental status. Found to have pneumonia.  He appears much more alert and completely oriented today, though very weak.  REVIEW OF SYSTEMS:    Review of Systems  Constitutional: Positive for malaise/fatigue and weight loss. Negative for fever.  HENT: Negative for hearing loss and sore throat.   Eyes: Negative for blurred vision and pain.  Respiratory: Negative for cough, sputum production and shortness of breath.   Cardiovascular: Negative for chest pain, palpitations and leg swelling.  Gastrointestinal: Negative for abdominal pain, diarrhea, heartburn, nausea and vomiting.  Genitourinary: Negative for dysuria.  Musculoskeletal: Negative for myalgias.  Skin: Negative for rash.  Neurological: Positive for weakness. Negative for dizziness, tremors, speech change, focal weakness and headaches.    DRUG ALLERGIES:  No Known Allergies  VITALS:  Blood pressure 129/76, pulse 66, temperature 98.7 F (37.1 C), temperature source Oral, resp. rate (!) 22, height 5' 10"  (1.778 m), weight 48.9 kg (107 lb 11.2 oz), SpO2 98 %.  PHYSICAL EXAMINATION:   GENERAL:  70 y.o.-year-old Very thin patient lying in the bed with Some distress due to pain.  EYES: Pupils equal, round, reactive to light and accommodation. No scleral icterus.  HEENT: Head atraumatic, normocephalic. Oropharynx and nasopharynx clear.  NECK:  Supple, no jugular venous distention. No thyroid enlargement, no tenderness.  LUNGS:  Normal breath sounds bilaterally, no wheezing, few scattered rales,rhonchi or crepitations noted anteriorly. Shallow inspirations, effort . No use of accessory muscles of respiration.  CARDIOVASCULAR: S1, S2 normal. No murmurs, rubs, or gallops.  ABDOMEN: Soft, nontender, nondistended. Bowel sounds present. No organomegaly or mass. Hemoccult was performed in the emergency room was positive, although stool was brown, no black or tarry looking stool or blood, per emergency room physician EXTREMITIES: No pedal edema, cyanosis, or clubbing.  NEUROLOGIC: Cranial nerves 2 through 12 are normal . Muscle strength 3-4/5 ,  able to move all extremities. Sensation intact. Gait not checked.  PSYCHIATRIC: The patient is alert and completely oriented today. SKIN: No obvious rash, lesion, or ulcer.  Physical Exam LABORATORY PANEL:   CBC  Recent Labs Lab 04/29/16 0637  WBC 4.6  HGB 7.9*  HCT 23.7*  PLT 22*   ------------------------------------------------------------------------------------------------------------------  Chemistries   Recent Labs Lab 04/29/16 0637  NA 149*  K 3.5  CL 123*  CO2 23  GLUCOSE 119*  BUN 23*  CREATININE 0.70  CALCIUM 7.5*  MG 1.9  AST 41  ALT 61  ALKPHOS 164*  BILITOT 0.4   ------------------------------------------------------------------------------------------------------------------  Cardiac Enzymes No results for input(s): TROPONINI in the last 168 hours. ------------------------------------------------------------------------------------------------------------------  RADIOLOGY:  Dg Abd 1 View  Result Date: 04/29/2016 CLINICAL DATA:  Pt has abdominal distention with complaints of some pain. Nonsmoker. Hx of TB. Hx od constipation. Additional history of multiple myeloma for clinical data provided on PET-CT report of 03/29/2016. EXAM: ABDOMEN - 1 VIEW COMPARISON:  None. FINDINGS: Overall bowel gas pattern is nonobstructive. Fairly large amount of  gas within the colon. Questionable  thickening of the walls of a small bowel loop in the pelvis. No evidence of soft tissue mass or abnormal fluid collection. No evidence of free intraperitoneal air. No pathologic appearing calcifications seen. IMPRESSION: Nonobstructive bowel gas pattern. Fairly large amount of gas within the colon. Questionable thickening of the walls of a small bowel loop in the pelvis (enteritis? ). Electronically Signed   By: Franki Cabot M.D.   On: 04/29/2016 08:59    ASSESSMENT AND PLAN:   Active Problems:   Pancytopenia ([place])   Metabolic encephalopathy   Healthcare-associated pneumonia   Elevated transaminase level   SIRS (systemic inflammatory response syndrome) (HCC)   Protein-calorie malnutrition, severe  * Sepsis with HCAP   Likely Aspiration pneumonia       Appreciated ID consult, try to get sputum cx    Abx changed to Zosyn+ azithromycin. ( stop azithromycin tomorrow)    Also ordered AFB culture. ID suggested to have bronch, viral and fungal tests, if he does not improve.    SLP eval done - now said- pt is able to swallow safe.    Councelled family about no oral intake.  * metabolic encephalopathy   Due to healthcare associated pneumonia and malnutrition   Also under effect of pain medications.   Completely alert and oriented today.    Continue monitoring.  * Metastatic multiple myeloma   Patient looks to have very poor prognosis.   Palliative care appreciated but family still wanted to give it a try if he can tolerate chemotherapy.   I discussed with Dr. Vicenta Aly- he suggested if patient has improvement in his nutrition then he may try chemotherapy agents which have given him promise in the results and he may have a few years survival with good quality of life.    * Severe malnutrition   Dietary consult, started again on TPN.   He may need to address feeding issue for long-term so called GI consult.   Appreciated GI consult, because patient has  thrombocytopenia and active infection currently not a candidate for any procedure. Reevaluate in the future if he improves.   As per repeat SLP eval- he can swallow, advised to have small sips and small amount at a time.  * Pancytopenia   Oncology on the case, keep monitoring.   Had thrombocytopenia and anemia    On 04/29/16- transfused Plt and PRBC.  All the records are reviewed and case discussed with Care Management/Social Workerr. Management plans discussed with the patient, family and they are in agreement.  CODE STATUS: full  TOTAL TIME TAKING CARE OF THIS PATIENT: 45 minutes.   I had detailed discussion with the patient's daughter and son in the room and discussed the case with oncologist, palliative care nurse practitioner .  POSSIBLE D/C IN 2-3 DAYS, DEPENDING ON CLINICAL CONDITION.   Vaughan Basta M.D on 04/29/2016   Between 7am to 6pm - Pager - [phone]  After 6pm go to [email or web address] - password [email or web address] [facility]  Office  [phone]  CC: Primary care physician; Alfonse Flavors, MD  Note: This dictation was prepared with Dragon dictation along with smaller phrase technology. Any transcriptional errors that result from this process are unintentional.

## 2016-04-29 NOTE — Consult Note (Signed)
PHARMACY - ADULT TOTAL PARENTERAL NUTRITION CONSULT NOTE   Pharmacy Consult for TPN Indication:Severe malnutrition in context of chronic illness  Patient Measurements: Height: 5\' 10"  (177.8 cm) Weight: 107 lb 11.2 oz (48.9 kg) (pt had 1 pillow and multiple blankets under him and on him) IBW/kg (Calculated) : 73 TPN AdjBW (KG): 42.7 Body mass index is 15.45 kg/m. Usual Weight:   Assessment:   GI: not a candidate for tube placement, at least at this time Endo: Insulin requirements in the past 24 hours:0 units Lytes:k=3.5, Mg=1.9, phos=2.1, corrected ca=9.26, Na=149 Renal: Pulm: possible aspiration PNA on azithro, zosyn,  Cards: Hepatobil: Neuro: ID: possible aspiration PNA on azithro, zosyn,   Best Practices: TPN Access:PICC TPN start date: started at previous admission this month, restarted 10/18  Nutritional Goals (per RD recommendation on 10/18): 1590 kcal, 66 grams protein, 1320 ml fluid with total calories from lipids averaged over 7 days   Current Nutrition:   Plan:  increase Clinimix 5/20 to goal rate 30ml/hr Continue to monitor K, phos, Mg closley as pt is at risk for refeeding.  Strict I/O, daily weights, CBG q 4hr checks with sensitive SSI ordered.    10/20 Phos = 3.5 this evening after repletion. No supplementation needed at this time. Recheck electrolytes with AM labs tomorrow.  Lenis Noon, PharmD 04/29/16 7:00 PM

## 2016-04-29 NOTE — Progress Notes (Signed)
Speech Language Pathology Treatment: Dysphagia  Patient Details Name: Francisco [name] MRN: [id number] DOB: Aug 27, 1945 Today's Date: 04/29/2016 Time: 1400-1440 SLP Time Calculation (min) (ACUTE ONLY): 40 min  Assessment / Plan / Recommendation Clinical Impression  Pt resting during this session receiving transfusion. Met w/ Daughter and Son for education and instruction on feeding support and aspiration precautions; general Reflux precautions. Family reported pt is adequately tolerating (and swallowing) PO trials of thin and nectar liquids and taking small bites of foods (mashed some to be soft) with no immediate, overt s/s of aspiration. Pt continues to have hiccups at times during oral intake. He is feeding self holding the cup to drink given min support needed d/t shakiness. Pt does experience esophageal phase discomfort (hiccups) due to recent Chemo and Radiation therapy and swallowing of extra air with the liquid trials (aerophagia). Per recent CT scan, pt does have a dilated esophagus full of fluid/air thus increased pressure in the Esophagus. This can greatly increase dysmotility and "Full feelings" w/ decreased oral intake as pt tends to experience and demonstrate. Any regurgitation can increase risk for aspiration of the Reflux as well leading to Pulmonary decline. Suggested they f/u w/ GI for further information of the Esophageal dysmotility.  Pt appears to exhibit reduced risk for aspiration following general aspiration and Reflux precautions including feeding self by holding cup when drinking and sitting fully upright during oral intake and AFTER oral intake for Reflux precautions. ST services recommends a softened foods diet (level 1 and 2 though family is mashing his favorite foods to the necessary consistency for him) with Thin liquids, foods and liquids at the discretion and comfort of the Pt, following aspiration precautions. Dietician if following. Reccomend follow up with GI services  due to apparent esophageal discomfort and consistent N/V in order to better address nutrition needs; TPN is ongoing. Family is interested in pt having a PEG tube plced for nutritional support and asked questions regarding this today; general information given w/ the recommendation to f/u w/ MD/GI for information. Nursing updated.    HPI HPI: Pt is a 70 y.o. male with a past medical history significant for multiple myeloma and ongoing Radiation tx who presents to the hospital with complaints of altered mental status, somnolence, shortness of breath, shakes. According to patient's family, patient had chemotherapy last Thursday, about 3 days ago, he is been suffering with diarrhea for the past 2 days, not eating, just few spoons, today at 2 AM he developed shortness of breath, shakiness, and was very drowsy and was brought to emergency room for further evaluation and treatment. In the emergency room he was noted to have tachypnea, tachycardia, history is x-ray was concerning for pneumonia, labs revealed pancytopenia. Pt recently admitted to this hospital and was seen for evaluation x1 when he expericened complaints of recurrent nausea and vomiting. The patient recently underwent chemotherapy and received intravenous fluid in clinic. He tries to eat at home but vomits shortly after swallowing. The patient admitted that he had difficulty swallowing and that his throat is sore at that time. Due to clinical dehydration and intractable nausea and vomiting the emergency department staff called for admission. Per report from staff/family member Pt has experienced significant weight loss in the past 4-6 weeks. Pt has been eating some food and drinking sips per report by family, but Pt usually experiences nausea and vomiting - this could be adding to cause of sore throat(vomiting). Family and Pt denied choking or coughing with POs during last admission and this  admission. Pt is eating mostly soups his family prepares for him;  some mashed foods.       SLP Plan  Continue with current plan of care     Recommendations  Diet recommendations: Dysphagia 1 (puree);Dysphagia 2 (fine chop);Thin liquid Liquids provided via: Cup;No straw Medication Administration: Crushed with puree (pills one at a time if pt desires - in puree?) Supervision: Patient able to self feed;Staff to assist with self feeding;Intermittent supervision to cue for compensatory strategies Compensations: Minimize environmental distractions;Slow rate;Small sips/bites;Follow solids with liquid Postural Changes and/or Swallow Maneuvers: Seated upright 90 degrees;Upright 30-60 min after meal                General recommendations:  (Dietician following) Oral Care Recommendations: Oral care BID;Staff/trained caregiver to provide oral care Follow up Recommendations: None Plan: Continue with current plan of care       [place], [place], CCC-SLP Watson,Katherine 04/29/2016, 3:07 PM

## 2016-04-29 NOTE — Progress Notes (Signed)
[facility] INFECTIOUS DISEASE PROGRESS NOTE Date of Admission:  04/25/2016     ID: Francisco [name] is a 70 y.o. male with probable aspiration PNA, hx treated TB, Multiple myeloma and FTT Active Problems:   Pancytopenia (HCC)   Metabolic encephalopathy   Healthcare-associated pneumonia   Elevated transaminase level   SIRS (systemic inflammatory response syndrome) (HCC)   Protein-calorie malnutrition, severe   Subjective: No fevers, a little stronger. Eating a little. No bm for about a week  ROS  Eleven systems are reviewed and negative except per hpi  Medications:  Antibiotics Given (last 72 hours)    Date/Time Action Medication Dose Rate   04/26/16 2030 Given   azithromycin (ZITHROMAX) tablet 500 mg 500 mg    04/26/16 2300 Given   piperacillin-tazobactam (ZOSYN) IVPB 3.375 g 3.375 g 12.5 mL/hr   04/27/16 0548 Given   piperacillin-tazobactam (ZOSYN) IVPB 3.375 g 3.375 g 12.5 mL/hr   04/27/16 0930 Given   vancomycin (VANCOCIN) IVPB 750 mg/150 ml premix 750 mg 150 mL/hr   04/27/16 1455 Given   piperacillin-tazobactam (ZOSYN) IVPB 3.375 g 3.375 g 12.5 mL/hr   04/27/16 1900 Given   azithromycin (ZITHROMAX) 500 mg in dextrose 5 % 250 mL IVPB 500 mg 250 mL/hr   04/27/16 2338 Given   piperacillin-tazobactam (ZOSYN) IVPB 3.375 g 3.375 g 12.5 mL/hr   04/28/16 0519 Given   piperacillin-tazobactam (ZOSYN) IVPB 3.375 g 3.375 g 12.5 mL/hr   04/28/16 1342 Given   piperacillin-tazobactam (ZOSYN) IVPB 3.375 g 3.375 g 12.5 mL/hr   04/28/16 1530 Given   vancomycin (VANCOCIN) IVPB 1000 mg/200 mL premix 1,000 mg 200 mL/hr   04/28/16 2318 Given   azithromycin (ZITHROMAX) 500 mg in dextrose 5 % 250 mL IVPB 500 mg 250 mL/hr   04/29/16 0039 Given   piperacillin-tazobactam (ZOSYN) IVPB 3.375 g 3.375 g 12.5 mL/hr   04/29/16 0634 Given  [pt does not want no more than 1/2 the amount. he refused the rest]   piperacillin-tazobactam (ZOSYN) IVPB 3.375 g 3.375 g 12.5 mL/hr     .  azithromycin  500 mg Oral Q24H  . docusate sodium  100 mg Oral BID  . famotidine  20 mg Oral BID  . feeding supplement  1 Container Oral TID BM  . insulin aspart  0-9 Units Subcutaneous Q4H  . mouth rinse  15 mL Mouth Rinse BID  . piperacillin-tazobactam (ZOSYN)  IV  3.375 g Intravenous Q8H  . potassium phosphate IVPB (mmol)  30 mmol Intravenous Once  . senna-docusate  1 tablet Oral BID  . sodium chloride flush  3 mL Intravenous Q12H  . tiotropium  18 mcg Inhalation Daily    Objective: Vital signs in last 24 hours: Temp:  [98.3 F (36.8 C)-99 F (37.2 C)] 98.7 F (37.1 C) (10/20 1411) Pulse Rate:  [66-87] 66 (10/20 1411) Resp:  [18-22] 22 (10/20 1411) BP: (111-131)/(69-82) 129/76 (10/20 1411) SpO2:  [96 %-99 %] 98 % (10/20 1411) Weight:  [48.9 kg (107 lb 11.2 oz)] 48.9 kg (107 lb 11.2 oz) (10/20 0500) Constitutional: ill appearing, frail, cachectic HENT: anicteric, pale Mouth/Throat: Oropharynx is clear and dry . No oropharyngeal exudate.  Cardiovascular: Normal rate, regular rhythm and normal heart sounds. Exam reveals no gallop and no friction rub.  No murmur heard.  Pulmonary/Chest: bil rhonchi, poor resp effort Abdominal: Soft. Bowel sounds are normal. He exhibits no distension. There is no tenderness.  Lymphadenopathy: He has no cervical adenopathy.  Neurological: lethargic Skin: Skin is warm and dry.  No rash noted. No erythema.  Psychiatric:lethargic  Lab Results  Recent Labs  04/28/16 1002 04/29/16 0637  WBC 3.9 4.6  HGB 8.3* 7.9*  HCT 25.1* 23.7*  NA 153* 149*  K 3.5 3.5  CL 127* 123*  CO2 23 23  BUN 28* 23*  CREATININE 0.73 0.70    Microbiology: Results for orders placed or performed during the hospital encounter of 04/25/16  Blood culture (routine x 2)     Status: None (Preliminary result)   Collection Time: 04/25/16  4:32 PM  Result Value Ref Range Status   Specimen Description BLOOD  Final   Special Requests NONE  Final   Culture NO GROWTH 4  DAYS  Final   Report Status PENDING  Incomplete  Blood culture (routine x 2)     Status: None (Preliminary result)   Collection Time: 04/25/16  4:32 PM  Result Value Ref Range Status   Specimen Description BLOOD  Final   Special Requests NONE  Final   Culture NO GROWTH 4 DAYS  Final   Report Status PENDING  Incomplete  MRSA PCR Screening     Status: None   Collection Time: 04/26/16 11:35 AM  Result Value Ref Range Status   MRSA by PCR NEGATIVE NEGATIVE Final    Comment:        The GeneXpert MRSA Assay (FDA approved for NASAL specimens only), is one component of a comprehensive MRSA colonization surveillance program. It is not intended to diagnose MRSA infection nor to guide or monitor treatment for MRSA infections.   Culture, expectorated sputum-assessment     Status: None   Collection Time: 04/29/16  9:41 AM  Result Value Ref Range Status   Specimen Description EXPECTORATED SPUTUM  Final   Special Requests Immunocompromised  Final   Sputum evaluation   Final    Sputum specimen not acceptable for testing.  Please recollect.   CALLED TO DIEDRE MALCOLM AT 6073 ON 04/29/16.Marland KitchenMarland KitchenRichland Memorial Hospital    Report Status 04/29/2016 FINAL  Final    Studies/Results: Dg Abd 1 View  Result Date: 04/29/2016 CLINICAL DATA:  Pt has abdominal distention with complaints of some pain. Nonsmoker. Hx of TB. Hx od constipation. Additional history of multiple myeloma for clinical data provided on PET-CT report of 03/29/2016. EXAM: ABDOMEN - 1 VIEW COMPARISON:  None. FINDINGS: Overall bowel gas pattern is nonobstructive. Fairly large amount of gas within the colon. Questionable thickening of the walls of a small bowel loop in the pelvis. No evidence of soft tissue mass or abnormal fluid collection. No evidence of free intraperitoneal air. No pathologic appearing calcifications seen. IMPRESSION: Nonobstructive bowel gas pattern. Fairly large amount of gas within the colon. Questionable thickening of the walls of a small  bowel loop in the pelvis (enteritis? ). Electronically Signed   By: Franki Cabot M.D.   On: 04/29/2016 08:59    Assessment/Plan: Francisco [name] is a 70 y.o. male with new dx of myeloma with diffuse bony lesions, on chemo and XRT admitted with progressive weakness, sob, pancytopenia. He has hx TB in 1985 reportedly treated in 1985 in [place].  His CT done a month ago shows chronic scarring or upper lobes, but CXR now shows some progressive airspace disease.  Interestingly he also has some hoarseness.  CT shows findings of likely repeat aspiration and he has air and fluid distention of his esophagus.  I think TB is unlikely but possible in this immunocompromised patient. He has been unable to produce sputum .  He is  at risk of other pathogens including PCP (But CT pattern atypical and not hypoxic) and fungal WBC up to 4.6  Recommendations Since unable to produce induced sputum  can dcairboren  isolation Cont zosyn and finish course of azithromycin Would repeat cxr today  to ensure improving Will check fungal serology since unable to produce sputum for culture If worsens from fevers or pulm decline would  Suggest a bronch with virus culture, PCP, fungal and AFB culture as well as routine cx Thank you very much for the consult. Will follow with you.  [employer], Francisco [name]   04/29/2016, 3:22 PM

## 2016-04-29 NOTE — Consult Note (Signed)
PAtient on TPN, encourage him to swallow nutritional supplements.  He is off isolation per ID.  He is a high risk patient for EGD and I will avoid this if at all possible.  Abd film today showed minimal distention in colon. No new suggestions.

## 2016-04-29 NOTE — Plan of Care (Signed)
Problem: SLP Dysphagia Goals Goal: Misc Dysphagia Goal Pt will safely tolerate po diet of least restrictive consistency w/ no overt s/s of aspiration noted by Staff/pt/family x3 sessions.    

## 2016-04-30 ENCOUNTER — Inpatient Hospital Stay: Payer: Medicare Other

## 2016-04-30 DIAGNOSIS — A419 Sepsis, unspecified organism: Secondary | ICD-10-CM | POA: Diagnosis not present

## 2016-04-30 DIAGNOSIS — C9000 Multiple myeloma not having achieved remission: Secondary | ICD-10-CM | POA: Diagnosis not present

## 2016-04-30 DIAGNOSIS — J189 Pneumonia, unspecified organism: Secondary | ICD-10-CM | POA: Diagnosis not present

## 2016-04-30 DIAGNOSIS — E860 Dehydration: Secondary | ICD-10-CM | POA: Diagnosis not present

## 2016-04-30 DIAGNOSIS — R4182 Altered mental status, unspecified: Secondary | ICD-10-CM | POA: Diagnosis not present

## 2016-04-30 DIAGNOSIS — E43 Unspecified severe protein-calorie malnutrition: Secondary | ICD-10-CM | POA: Diagnosis not present

## 2016-04-30 LAB — PHOSPHORUS
PHOSPHORUS: 3.1 mg/dL (ref 2.5–4.6)
Phosphorus: 2.2 mg/dL — ABNORMAL LOW (ref 2.5–4.6)

## 2016-04-30 LAB — GLUCOSE, CAPILLARY
GLUCOSE-CAPILLARY: 156 mg/dL — AB (ref 65–99)
GLUCOSE-CAPILLARY: 159 mg/dL — AB (ref 65–99)
Glucose-Capillary: 155 mg/dL — ABNORMAL HIGH (ref 65–99)
Glucose-Capillary: 147 mg/dL — ABNORMAL HIGH (ref 65–99)
Glucose-Capillary: 130 mg/dL — ABNORMAL HIGH (ref 65–99)
Glucose-Capillary: 103 mg/dL — ABNORMAL HIGH (ref 65–99)
Glucose-Capillary: 159 mg/dL — ABNORMAL HIGH (ref 65–99)
Glucose-Capillary: 145 mg/dL — ABNORMAL HIGH (ref 65–99)

## 2016-04-30 LAB — CBC WITH DIFFERENTIAL/PLATELET
Basophils Absolute: 0 10*3/uL (ref 0–0.1)
Basophils Relative: 0 %
Eosinophils Absolute: 0 10*3/uL (ref 0–0.7)
Eosinophils Relative: 0 %
HEMATOCRIT: 27.6 % — AB (ref 40.0–52.0)
Hemoglobin: 9.3 g/dL — ABNORMAL LOW (ref 13.0–18.0)
LYMPHS ABS: 0.9 10*3/uL — AB (ref 1.0–3.6)
LYMPHS PCT: 14 %
MCH: 28.8 pg (ref 26.0–34.0)
MCHC: 33.6 g/dL (ref 32.0–36.0)
MCV: 85.7 fL (ref 80.0–100.0)
MONO ABS: 0.3 10*3/uL (ref 0.2–1.0)
MONOS PCT: 6 %
NEUTROS ABS: 4.7 10*3/uL (ref 1.4–6.5)
Neutrophils Relative %: 80 %
Platelets: 46 10*3/uL — ABNORMAL LOW (ref 150–440)
RBC: 3.23 MIL/uL — ABNORMAL LOW (ref 4.40–5.90)
RDW: 17.1 % — AB (ref 11.5–14.5)
WBC: 5.9 10*3/uL (ref 3.8–10.6)

## 2016-04-30 LAB — COMPREHENSIVE METABOLIC PANEL
ALBUMIN: 1.9 g/dL — AB (ref 3.5–5.0)
ALK PHOS: 164 U/L — AB (ref 38–126)
ALT: 75 U/L — ABNORMAL HIGH (ref 17–63)
AST: 63 U/L — AB (ref 15–41)
Anion gap: 5 (ref 5–15)
BILIRUBIN TOTAL: 0.8 mg/dL (ref 0.3–1.2)
BUN: 23 mg/dL — AB (ref 6–20)
CALCIUM: 7.5 mg/dL — AB (ref 8.9–10.3)
CO2: 23 mmol/L (ref 22–32)
Chloride: 113 mmol/L — ABNORMAL HIGH (ref 101–111)
Creatinine, Ser: 0.55 mg/dL — ABNORMAL LOW (ref 0.61–1.24)
GFR calc Af Amer: >60 mL/min (ref >60)
GFR calc non Af Amer: >60 mL/min (ref >60)
GLUCOSE: 147 mg/dL — AB (ref 65–99)
Potassium: 3.9 mmol/L (ref 3.5–5.1)
Sodium: 141 mmol/L (ref 135–145)
TOTAL PROTEIN: 5.2 g/dL — AB (ref 6.5–8.1)

## 2016-04-30 LAB — TYPE AND SCREEN
ABO/RH(D): O POS
ANTIBODY SCREEN: NEGATIVE
UNIT DIVISION: 0

## 2016-04-30 LAB — CULTURE, BLOOD (ROUTINE X 2)
CULTURE: NO GROWTH
CULTURE: NO GROWTH

## 2016-04-30 LAB — PREPARE PLATELET PHERESIS: UNIT DIVISION: 0

## 2016-04-30 LAB — MAGNESIUM: Magnesium: 1.9 mg/dL (ref 1.7–2.4)

## 2016-04-30 MED ORDER — ALTEPLASE 2 MG IJ SOLR
2.0000 mg | Freq: Once | INTRAMUSCULAR | Status: AC | PRN
  Administered 2016-04-30: 2 mg
  Filled 2016-04-30: qty 2

## 2016-04-30 MED ORDER — GADOBENATE DIMEGLUMINE 529 MG/ML IV SOLN
10.0000 mL | Freq: Once | INTRAVENOUS | Status: AC | PRN
  Administered 2016-04-30: 09:00:00 9 mL via INTRAVENOUS

## 2016-04-30 MED ORDER — STERILE WATER FOR INJECTION IJ SOLN
INTRAMUSCULAR | Status: AC
  Administered 2016-04-30: 12:00:00
  Filled 2016-04-30: qty 10

## 2016-04-30 MED ORDER — TRACE MINERALS CR-CU-MN-SE-ZN 10-1000-500-60 MCG/ML IV SOLN
INTRAVENOUS | Status: AC
  Administered 2016-04-30: 18:00:00 via INTRAVENOUS
  Filled 2016-04-30: qty 1320

## 2016-04-30 MED ORDER — DEXTROSE 50 % IV SOLN: 50.0000 mL | Freq: Once | INTRAVENOUS | Status: DC

## 2016-04-30 MED ORDER — POTASSIUM PHOSPHATES 15 MMOLE/5ML IV SOLN
30.0000 mmol | Freq: Once | INTRAVENOUS | Status: AC
  Administered 2016-04-30: 30 mmol via INTRAVENOUS
  Filled 2016-04-30: qty 10

## 2016-04-30 MED ORDER — DEXTROSE 50 % IV SOLN
INTRAVENOUS | Status: AC
  Filled 2016-04-30: qty 50

## 2016-04-30 NOTE — Progress Notes (Signed)
May hold TPN for patient to have MRI per Dr. Bridgett Larsson

## 2016-04-30 NOTE — Consult Note (Signed)
Patient taking in more oral nourishment per his family.  He had a good bowel movement and abd much less distended today.  I encouraged him to continue to take oral nourishment.  PEG not a good option if he can swallow and drink enough.

## 2016-04-30 NOTE — Assessment & Plan Note (Signed)
Due to myeloma Transfused rbc and platelets last night Cbc today is..stable No transfusion today

## 2016-04-30 NOTE — Assessment & Plan Note (Signed)
Clinically, imrpoving with some return of appetite, Bp stable Continue zosyn.

## 2016-04-30 NOTE — Consult Note (Addendum)
PHARMACY - ADULT TOTAL PARENTERAL NUTRITION CONSULT NOTE   Pharmacy Consult for TPN Indication:Severe malnutrition in context of chronic illness  Patient Measurements: Height: 5\' 10"  (177.8 cm) Weight: 104 lb (47.2 kg) (had extra weigh included on the bed.) IBW/kg (Calculated) : 73 TPN AdjBW (KG): 42.7 Body mass index is 14.92 kg/m. Usual Weight:   Assessment:   GI: not a candidate for tube placement, at least at this time Endo: Insulin requirements in the past 24 hours:0 units Lytes:k=3.5, Mg=2.3, phos=1.9 Renal: Pulm: possible aspiration PNA on azithro, zosyn, vanc Cards: Hepatobil: Neuro: ID: possible aspiration PNA on azithro, zosyn, vanc  Best Practices: TPN Access:PICC TPN start date: started at previous admission this month, restarted 10/18  Nutritional Goals (per RD recommendation on 10/18): 1590 kcal, 66 grams protein, 1320 ml fluid with total calories from lipids averaged over 7 days   Current Nutrition:   Plan:  Continue Clinimix 5/20 at 63ml/hr (Na still high)- goal rate 13ml/hr Continue to monitor K, phos, Mg closley as pt is at risk for refeeding.  Will give Kphos IV 10mmol once due to low phos. Recheck phos this evening.All electrolytes in the AM Strict I/O, daily weights, CBG q 4hr checks with sensitive SSI ordered.   10/21 PM PO4 3.1. No supplementation ordered. Labs ordered for tomorrow AM.  tdj  Clinical Pharmacist  04/30/2016,3:08 PM

## 2016-04-30 NOTE — Assessment & Plan Note (Signed)
Continue TPN,  Advance diet as tolerated. EGD and PEG highly recommended. Seen by Gi, deferred until his counts are stable and sepsis imrpoves

## 2016-04-30 NOTE — Progress Notes (Signed)
Dr Bridgett Larsson notified that one lumen of PICC is clogged, TPA did not work. Order PICC line per Dr Bridgett Larsson.

## 2016-04-30 NOTE — Progress Notes (Addendum)
Francisco [name] is seen today resting comfortably. He denies any pain,nausea or vomiting. No bleedign or easy brusiing Family notes that he was able to sip some soup and ate a few spoonful of mashed lentils. He did have a bowel  movement today today No cough/hemoptysis He is taken out of respiratory isolation.   Exam shows no petechiae or rash, no oral thrush  Mild tachypnea noted. He is easily arousable TPN running. Right arm PICC line functioinng and clean  His vitals reviewed today as below and are noted stable.  Blood pressure 114/63, pulse 63, temperature 98.7 F (37.1 C), temperature source Oral, resp. rate 20, height 5' 10" (1.778 m), weight 104 lb (47.2 kg), SpO2 97 %.   Lab Results  Component Value Date   WBC 5.9 04/30/2016   HGB 9.3 (L) 04/30/2016   HCT 27.6 (L) 04/30/2016   MCV 85.7 04/30/2016   PLT 46 (L) 04/30/2016   Lab Results  Component Value Date   CREATININE 0.55 (L) 04/30/2016   BUN 23 (H) 04/30/2016   NA 141 04/30/2016   K 3.9 04/30/2016   CL 113 (H) 04/30/2016   CO2 23 04/30/2016   Blood cultures - no growth so far CXR yesterday reviewed- continues to show the consolidation unchanged  CT angio  (04/27/16 )chest shows dense consolidation and interstiitial infiltrates consistent with pneumonia No PE  MR brain today is negative for bleed/infarct or SOL  Serum  Kappa light chains have declined from over 1000 to aoout 40 since prior to treatment, repeat SPEP pending.  Impression and Plan: Multiple myeloma in relapse (HCC) Definite response to Velcade as per decline in light chains Revlimd on hold Calcium is stable Cytopenias due to multiple myeloma- improving To conitnue Neupogen, transfuse PRBC AND platelets as needed. No transfusion needed today. Started on steroids, continue same.  Pancytopenia (HCC) Due to myeloma Transfused rbc and platelets last night Cbc today is..stable No transfusion today   Protein-calorie malnutrition,  severe Continue TPN,  Advance diet as tolerated. EGD and PEG highly recommended. Seen by Gi, deferred until his counts are stable and sepsis imrpoves   Healthcare-associated pneumonia Clinically, imrpoving with some return of appetite, Bp stable Continue zosyn.  Patient's family had several questions regarding his lab work, imaging results. I answered all their questions and reviewed the pertinent results to their satisfaction.  

## 2016-04-30 NOTE — Consult Note (Signed)
PICC line flushed with no difficulty, made sure it also drew blood easily. Used a total of 3 saline flushes and line appears to work well.

## 2016-04-30 NOTE — Assessment & Plan Note (Signed)
Definite response to Velcade as per decline in light chains Revlimd on hold Calcium is stable Cytopenias due to multiple myeloma- improving To conitnue Neupogen, transfuse PRBC AND platelets as needed. No transfusion needed today. Started on steroids, continue same.

## 2016-04-30 NOTE — Progress Notes (Signed)
[facility] [place] NAME: Francisco [name]    MR#:  [id number]  DATE OF BIRTH:  1946-03-14  SUBJECTIVE:  CHIEF COMPLAINT:   Chief Complaint  Patient presents with  . Altered Mental Status     Diagnosed with metastatic multiple myeloma a few months ago and have decreased oral intake with nausea and severe pain and have severe malnutrition. He was recently started on TPN 2 weeks ago, but had worsening in her overall condition and brought back with altered mental status. Found to have pneumonia.  Pt is alert and awake, no complaint except weakness. On TPN. Tolerated diet so far.  REVIEW OF SYSTEMS:    Review of Systems  Constitutional: Positive for malaise/fatigue. Negative for fever and weight loss.  HENT: Negative for hearing loss and sore throat.   Eyes: Negative for blurred vision and pain.  Respiratory: Negative for cough, sputum production and shortness of breath.   Cardiovascular: Negative for chest pain, palpitations and leg swelling.  Gastrointestinal: Negative for abdominal pain, diarrhea, heartburn, nausea and vomiting.  Genitourinary: Negative for dysuria.  Musculoskeletal: Negative for myalgias.  Skin: Negative for rash.  Neurological: Positive for weakness. Negative for dizziness, tremors, speech change, focal weakness and headaches.    DRUG ALLERGIES:  No Known Allergies  VITALS:  Blood pressure 114/63, pulse 63, temperature 98.7 F (37.1 C), temperature source Oral, resp. rate 20, height 5' 10"  (1.778 m), weight 104 lb (47.2 kg), SpO2 97 %.  PHYSICAL EXAMINATION:   GENERAL:  70 y.o.-year-old Very thin patient lying in the bed. EYES: Pupils equal, round, reactive to light and accommodation. No scleral icterus.  HEENT: Head atraumatic, normocephalic. Oropharynx and nasopharynx clear.  NECK:  Supple, no jugular venous distention. No thyroid enlargement, no tenderness.  LUNGS: Normal breath sounds bilaterally, no  wheezing, no rales,rhonchi or crepitations. No use of accessory muscles of respiration.  CARDIOVASCULAR: S1, S2 normal. No murmurs, rubs, or gallops.  ABDOMEN: Soft, nontender, nondistended. Bowel sounds present. No organomegaly or mass.  EXTREMITIES: No pedal edema, cyanosis, or clubbing.  NEUROLOGIC: Cranial nerves 2 through 12 are normal . Muscle strength 3/5 ,  able to move all extremities. Sensation intact. Gait not checked.  PSYCHIATRIC: The patient is alert and completely oriented today. SKIN: No obvious rash, lesion, or ulcer.  Physical Exam LABORATORY PANEL:   CBC  Recent Labs Lab 04/30/16 0750  WBC 5.9  HGB 9.3*  HCT 27.6*  PLT 46*   ------------------------------------------------------------------------------------------------------------------  Chemistries   Recent Labs Lab 04/30/16 0750  NA 141  K 3.9  CL 113*  CO2 23  GLUCOSE 147*  BUN 23*  CREATININE 0.55*  CALCIUM 7.5*  MG 1.9  AST 63*  ALT 75*  ALKPHOS 164*  BILITOT 0.8   ------------------------------------------------------------------------------------------------------------------  Cardiac Enzymes No results for input(s): TROPONINI in the last 168 hours. ------------------------------------------------------------------------------------------------------------------  RADIOLOGY:  Dg Chest 2 View  Result Date: 04/29/2016 CLINICAL DATA:  Cough.  Possible aspiration.  Multiple myeloma. EXAM: CHEST  2 VIEW COMPARISON:  04/25/2016 FINDINGS: Right arm PICC line remains in appropriate position. Heart size is within normal limits. Pulmonary hyperinflation remains stable. No evidence of pneumothorax. Airspace disease with air bronchograms again seen in the medial left lower lobe, suspicious for pneumonia. There is also mild symmetric bilateral perihilar airspace disease, suspicious for mild pulmonary edema. Small layering posterior pleural effusion noted. Pulmonary hyperinflation again demonstrated.  IMPRESSION: Persistent medial left lower lobe airspace disease and tiny posterior pleural effusion, suspicious  for pneumonia. Mild symmetric bilateral perihilar airspace opacity, suspicious for pulmonary edema. Stable cardiomegaly and pulmonary hyperinflation. Electronically Signed   By: Earle Gell M.D.   On: 04/29/2016 16:47   Dg Abd 1 View  Result Date: 04/29/2016 CLINICAL DATA:  Pt has abdominal distention with complaints of some pain. Nonsmoker. Hx of TB. Hx od constipation. Additional history of multiple myeloma for clinical data provided on PET-CT report of 03/29/2016. EXAM: ABDOMEN - 1 VIEW COMPARISON:  None. FINDINGS: Overall bowel gas pattern is nonobstructive. Fairly large amount of gas within the colon. Questionable thickening of the walls of a small bowel loop in the pelvis. No evidence of soft tissue mass or abnormal fluid collection. No evidence of free intraperitoneal air. No pathologic appearing calcifications seen. IMPRESSION: Nonobstructive bowel gas pattern. Fairly large amount of gas within the colon. Questionable thickening of the walls of a small bowel loop in the pelvis (enteritis? ). Electronically Signed   By: Franki Cabot M.D.   On: 04/29/2016 08:59   Mr Jeri Cos OE Contrast  Result Date: 04/30/2016 CLINICAL DATA:  70 year old male with history of multiple myeloma, malaria and tuberculosis presenting with decreased oral intake and altered mental status. Subsequent encounter. EXAM: MRI HEAD WITHOUT AND WITH CONTRAST TECHNIQUE: Multiplanar, multiecho pulse sequences of the brain and surrounding structures were obtained without and with intravenous contrast. CONTRAST:  34m MULTIHANCE GADOBENATE DIMEGLUMINE 529 MG/ML IV SOLN COMPARISON:  04/25/2016 head CT.  No comparison brain MR. FINDINGS: Exam is slightly motion degraded. Brain: No acute infarct or intracranial hemorrhage. Mild punctate and patchy white matter changes most likely related to result of chronic microvascular disease.  No intracranial mass or abnormal enhancement. Mild global atrophy without hydrocephalus. Vascular: Major intracranial vascular structures are patent. Skull and upper cervical spine: Abnormal bone marrow upper cervical spine with mild loss height of C3. Slightly speckled appearance of portions of the clivus. Findings may reflect changes of infiltration by myeloma as versus result of anemia. Sinuses/Orbits: Post lens replacement without acute orbital abnormality. Minimal mucosal thickening ethmoid sinus air cells. Partial opacification right mastoid air cells. Other: Negative IMPRESSION: Exam is slightly motion degraded. No acute infarct or intracranial hemorrhage. Mild chronic microvascular changes. No intracranial mass or abnormal enhancement. Mild global atrophy without hydrocephalus. Abnormal bone marrow upper cervical spine with mild loss of height of C3. Slightly speckled appearance of portions of the clivus. Findings may reflect changes of infiltration by myeloma as versus result of anemia. Electronically Signed   By: SGenia DelM.D.   On: 04/30/2016 09:44    ASSESSMENT AND PLAN:   Active Problems:   Pancytopenia ([facility]   Metabolic encephalopathy   Healthcare-associated pneumonia   Elevated transaminase level   SIRS (systemic inflammatory response syndrome) (HCC)   Protein-calorie malnutrition, severe  * Sepsis with HCAP   Likely Aspiration pneumonia       Appreciated ID consult, try to get sputum cx    Abx changed to Zosyn+ azithromycin. stop azithromycin today.    Also ordered AFB culture. ID suggested to have bronch, viral and fungal tests, if he does not improve.    SLP eval done - now said- pt is able to swallow safe.    Councelled family about no oral intake.  * metabolic encephalopathy   Due to healthcare associated pneumonia and malnutrition   Also under effect of pain medications.   Completely alert and oriented.  * Metastatic multiple myeloma   Patient looks to have very  poor prognosis.  Palliative care appreciated but family still wanted to give it a try if he can tolerate chemotherapy. Dr. Vicenta Aly suggested if patient has improvement in his nutrition then he may try chemotherapy agents which have given him promise in the results and he may have a few years survival with good quality of life.    * Severe malnutrition   Dietary consult, on TPN.   He may need to address feeding issue for long-term so called GI consult.   Appreciated GI consult, because patient has thrombocytopenia and active infection currently not a candidate for any procedure. Reevaluate in the future if he improves.   As per repeat SLP eval- he can swallow, advised to have small sips and small amount at a time.  * Pancytopenia   Oncology on the case, keep monitoring.   Had thrombocytopenia and anemia    On 04/29/16- transfused Plt and PRBC. Better.  All the records are reviewed and case discussed with Care Management/Social Workerr. Management plans discussed with the patient, his daughter and son, and they are in agreement.  CODE STATUS: full  TOTAL TIME TAKING CARE OF THIS PATIENT: 46 minutes.   I had detailed discussion with the patient's daughter and son in the room and discussed the case with oncologist, palliative care nurse practitioner .  POSSIBLE D/C IN 2-3 DAYS, DEPENDING ON CLINICAL CONDITION.   Demetrios Loll M.D on 04/30/2016   Between 7am to 6pm - Pager - [phone]  After 6pm go to [email or web address] - password [email or web address] [facility]  Office  [phone]  CC: Primary care physician; Alfonse Flavors, MD  Note: This dictation was prepared with Dragon dictation along with smaller phrase technology. Any transcriptional errors that result from this process are unintentional.

## 2016-05-01 ENCOUNTER — Inpatient Hospital Stay
Admit: 2016-05-01 | Discharge: 2016-05-01 | Disposition: A | Discharge status: Home / Self Care | Payer: Medicare Other | Attending: Internal Medicine | Admitting: Internal Medicine

## 2016-05-01 ENCOUNTER — Inpatient Hospital Stay: Payer: Medicare Other

## 2016-05-01 DIAGNOSIS — R4182 Altered mental status, unspecified: Secondary | ICD-10-CM | POA: Diagnosis not present

## 2016-05-01 DIAGNOSIS — R7989 Other specified abnormal findings of blood chemistry: Secondary | ICD-10-CM | POA: Diagnosis not present

## 2016-05-01 DIAGNOSIS — E43 Unspecified severe protein-calorie malnutrition: Secondary | ICD-10-CM | POA: Diagnosis not present

## 2016-05-01 DIAGNOSIS — J189 Pneumonia, unspecified organism: Secondary | ICD-10-CM | POA: Diagnosis not present

## 2016-05-01 DIAGNOSIS — A419 Sepsis, unspecified organism: Secondary | ICD-10-CM | POA: Diagnosis not present

## 2016-05-01 DIAGNOSIS — E860 Dehydration: Secondary | ICD-10-CM | POA: Diagnosis not present

## 2016-05-01 LAB — BASIC METABOLIC PANEL
ANION GAP: 5 (ref 5–15)
BUN: 19 mg/dL (ref 6–20)
CALCIUM: 7.4 mg/dL — AB (ref 8.9–10.3)
CO2: 22 mmol/L (ref 22–32)
CREATININE: 0.57 mg/dL — AB (ref 0.61–1.24)
Chloride: 105 mmol/L (ref 101–111)
Glucose, Bld: 142 mg/dL — ABNORMAL HIGH (ref 65–99)
Potassium: 3.9 mmol/L (ref 3.5–5.1)
SODIUM: 132 mmol/L — AB (ref 135–145)

## 2016-05-01 LAB — CBC WITH DIFFERENTIAL/PLATELET
BASOS ABS: 0.2 10*3/uL — AB (ref 0–0.1)
BASOS PCT: 2 %
EOS ABS: 0 10*3/uL (ref 0–0.7)
Eosinophils Relative: 0 %
HEMATOCRIT: 28.5 % — AB (ref 40.0–52.0)
Hemoglobin: 9.8 g/dL — ABNORMAL LOW (ref 13.0–18.0)
LYMPHS PCT: 13 %
Lymphs Abs: 1.1 10*3/uL (ref 1.0–3.6)
MCH: 28.9 pg (ref 26.0–34.0)
MCHC: 34.2 g/dL (ref 32.0–36.0)
MCV: 84.4 fL (ref 80.0–100.0)
MONO ABS: 0.8 10*3/uL (ref 0.2–1.0)
Monocytes Relative: 9 %
NEUTROS ABS: 6.5 10*3/uL (ref 1.4–6.5)
Neutrophils Relative %: 76 %
PLATELETS: 44 10*3/uL — AB (ref 150–440)
RBC: 3.38 MIL/uL — ABNORMAL LOW (ref 4.40–5.90)
RDW: 16.8 % — AB (ref 11.5–14.5)
WBC: 8.6 10*3/uL (ref 3.8–10.6)

## 2016-05-01 LAB — PHOSPHORUS: PHOSPHORUS: 2.3 mg/dL — AB (ref 2.5–4.6)

## 2016-05-01 LAB — MAGNESIUM: MAGNESIUM: 1.8 mg/dL (ref 1.7–2.4)

## 2016-05-01 LAB — GLUCOSE, CAPILLARY
GLUCOSE-CAPILLARY: 152 mg/dL — AB (ref 65–99)
Glucose-Capillary: 142 mg/dL — ABNORMAL HIGH (ref 65–99)
Glucose-Capillary: 140 mg/dL — ABNORMAL HIGH (ref 65–99)
Glucose-Capillary: 135 mg/dL — ABNORMAL HIGH (ref 65–99)
Glucose-Capillary: 140 mg/dL — ABNORMAL HIGH (ref 65–99)

## 2016-05-01 MED ORDER — METHYLPREDNISOLONE SODIUM SUCC 40 MG IJ SOLR
40.0000 mg | Freq: Two times a day (BID) | INTRAMUSCULAR | Status: DC
  Administered 2016-05-01 – 2016-05-04 (×6): 40 mg via INTRAVENOUS
  Filled 2016-05-01 (×6): qty 1

## 2016-05-01 MED ORDER — POTASSIUM PHOSPHATES 15 MMOLE/5ML IV SOLN
20.0000 mmol | Freq: Once | INTRAVENOUS | Status: AC
  Administered 2016-05-01: 20 mmol via INTRAVENOUS
  Filled 2016-05-01: qty 6.67

## 2016-05-01 MED ORDER — TRACE MINERALS CR-CU-MN-SE-ZN 10-1000-500-60 MCG/ML IV SOLN
INTRAVENOUS | Status: AC
  Administered 2016-05-01: 18:00:00 via INTRAVENOUS
  Filled 2016-05-01: qty 1320

## 2016-05-01 MED ORDER — POTASSIUM PHOSPHATES 15 MMOLE/5ML IV SOLN
30.0000 mmol | Freq: Once | INTRAVENOUS | Status: DC
  Filled 2016-05-01: qty 10

## 2016-05-01 NOTE — Progress Notes (Signed)
*  PRELIMINARY RESULTS* Echocardiogram 2D Echocardiogram has been performed.  Francisco [name] Francisco [name] 05/01/2016, 3:54 PM

## 2016-05-01 NOTE — Progress Notes (Signed)
PT Cancellation Note  Patient Details Name: Francisco [name] MRN: [id number] DOB: May 25, 1946   Cancelled Treatment:    Reason Eval/Treat Not Completed: Patient declined, no reason specified (Pt asleep again, not able to tolerate visit).  Pt is still appropriate to see when ready and asked family to let nursing know if he is more able to work Later in the day.   Ramond Dial 05/01/2016, 12:44 PM   [name], PT MS Acute Rehab Dept. Number: [place] and [place]

## 2016-05-01 NOTE — Consult Note (Addendum)
PHARMACY - ADULT TOTAL PARENTERAL NUTRITION CONSULT NOTE   Pharmacy Consult for TPN Indication:Severe malnutrition in context of chronic illness  Patient Measurements: Height: 5\' 10"  (177.8 cm) Weight: 105 lb (47.6 kg) IBW/kg (Calculated) : 73 TPN AdjBW (KG): 42.7 Body mass index is 15.07 kg/m. Usual Weight:   Assessment:   GI: not a candidate for tube placement, at least at this time Endo: Insulin requirements in the past 24 hours:9  units Renal: Pulm: possible aspiration PNA on azithro, zosyn, vanc Cards: Hepatobil: Neuro: ID: possible aspiration PNA on azithro, zosyn, vanc  Best Practices: TPN Access:PICC TPN start date: started at previous admission this month, restarted 10/18  Nutritional Goals (per RD recommendation on 10/18): 1590 kcal, 66 grams protein, 1320 ml fluid with total calories from lipids averaged over 7 days   Current Nutrition:   Plan:  Continue Clinimix 5/20 at 84ml/hr (Na still low )- goal rate 54ml/hr Continue to monitor K, phos, Mg closley as pt is at risk for refeeding.  Will give Kphos IV 20 mmol once due to low phos. Recheck phos this evening.All electrolytes in the AM Strict I/O, daily weights, CBG q 4hr checks with sensitive SSI ordered.   10/22 23:30 PO4 2.7 No supplementation ordered. Recheck labs in AM.  tdj  Clinical Pharmacist  05/01/2016,1:41 PM

## 2016-05-01 NOTE — Progress Notes (Signed)
   04/30/16 2020  PT Visit Information  Last PT Received On 04/30/16  Reason Eval/Treat Not Completed Patient declined, no reason specified (asleep and pt/family refused)  Restrictions  Weight Bearing Restrictions No  [name], PT MS Acute Rehab Dept. Number: [place] and [place]

## 2016-05-01 NOTE — Progress Notes (Signed)
[facility] NAME: Francisco [name]    MR#:  [id number]  DATE OF BIRTH:  16-Nov-1945  SUBJECTIVE:  CHIEF COMPLAINT:   Chief Complaint  Patient presents with  . Altered Mental Status     Diagnosed with metastatic multiple myeloma a few months ago and have decreased oral intake with nausea and severe pain and have severe malnutrition. He was recently started on TPN 2 weeks ago, but had worsening in her overall condition and brought back with altered mental status. Found to have pneumonia.  Pt complaint of mild SOB and weakness. On TPN. Poor oral intake.  REVIEW OF SYSTEMS:    Review of Systems  Constitutional: Positive for malaise/fatigue. Negative for fever and weight loss.       Poor oral intake.  HENT: Negative for hearing loss and sore throat.   Eyes: Negative for blurred vision and pain.  Respiratory: Positive for shortness of breath. Negative for cough and sputum production.   Cardiovascular: Negative for chest pain, palpitations and leg swelling.  Gastrointestinal: Negative for abdominal pain, diarrhea, heartburn, nausea and vomiting.  Genitourinary: Negative for dysuria.  Musculoskeletal: Negative for myalgias.  Skin: Negative for rash.  Neurological: Positive for weakness. Negative for dizziness, tremors, speech change, focal weakness and headaches.    DRUG ALLERGIES:  No Known Allergies  VITALS:  Blood pressure 123/77, pulse 66, temperature 97.6 F (36.4 C), temperature source Oral, resp. rate 20, height 5' 10"  (1.778 m), weight 105 lb (47.6 kg), SpO2 100 %.  PHYSICAL EXAMINATION:   GENERAL:  70 y.o.-year-old Very thin patient lying in the bed. Malnutrition. EYES: Pupils equal, round, reactive to light and accommodation. No scleral icterus.  HEENT: Head atraumatic, normocephalic. Oropharynx and nasopharynx clear.  NECK:  Supple, no jugular venous distention. No thyroid enlargement, no tenderness.  LUNGS:  Normal breath sounds bilaterally, no wheezing, no rales,rhonchi or crepitations. No use of accessory muscles of respiration.  CARDIOVASCULAR: S1, S2 normal. No murmurs, rubs, or gallops.  ABDOMEN: Soft, nontender, nondistended. Bowel sounds present. No organomegaly or mass.  EXTREMITIES: No pedal edema, cyanosis, or clubbing.  NEUROLOGIC: Cranial nerves 2 through 12 are normal . Muscle strength 3/5 ,  able to move all extremities. Sensation intact. Gait not checked.  PSYCHIATRIC: The patient is alert and completely oriented today. SKIN: No obvious rash, lesion, or ulcer.  Physical Exam LABORATORY PANEL:   CBC  Recent Labs Lab 05/01/16 0817  WBC 8.6  HGB 9.8*  HCT 28.5*  PLT 44*   ------------------------------------------------------------------------------------------------------------------  Chemistries   Recent Labs Lab 04/30/16 0750 05/01/16 0817  NA 141 132*  K 3.9 3.9  CL 113* 105  CO2 23 22  GLUCOSE 147* 142*  BUN 23* 19  CREATININE 0.55* 0.57*  CALCIUM 7.5* 7.4*  MG 1.9 1.8  AST 63*  --   ALT 75*  --   ALKPHOS 164*  --   BILITOT 0.8  --    ------------------------------------------------------------------------------------------------------------------  Cardiac Enzymes No results for input(s): TROPONINI in the last 168 hours. ------------------------------------------------------------------------------------------------------------------  RADIOLOGY:  Dg Chest 2 View  Result Date: 04/29/2016 CLINICAL DATA:  Cough.  Possible aspiration.  Multiple myeloma. EXAM: CHEST  2 VIEW COMPARISON:  04/25/2016 FINDINGS: Right arm PICC line remains in appropriate position. Heart size is within normal limits. Pulmonary hyperinflation remains stable. No evidence of pneumothorax. Airspace disease with air bronchograms again seen in the medial left lower lobe, suspicious for pneumonia. There is also mild symmetric bilateral perihilar  airspace disease, suspicious for mild  pulmonary edema. Small layering posterior pleural effusion noted. Pulmonary hyperinflation again demonstrated. IMPRESSION: Persistent medial left lower lobe airspace disease and tiny posterior pleural effusion, suspicious for pneumonia. Mild symmetric bilateral perihilar airspace opacity, suspicious for pulmonary edema. Stable cardiomegaly and pulmonary hyperinflation. Electronically Signed   By: Earle Gell M.D.   On: 04/29/2016 16:47   Mr Jeri Cos DP Contrast  Result Date: 04/30/2016 CLINICAL DATA:  70 year old male with history of multiple myeloma, malaria and tuberculosis presenting with decreased oral intake and altered mental status. Subsequent encounter. EXAM: MRI HEAD WITHOUT AND WITH CONTRAST TECHNIQUE: Multiplanar, multiecho pulse sequences of the brain and surrounding structures were obtained without and with intravenous contrast. CONTRAST:  39m MULTIHANCE GADOBENATE DIMEGLUMINE 529 MG/ML IV SOLN COMPARISON:  04/25/2016 head CT.  No comparison brain MR. FINDINGS: Exam is slightly motion degraded. Brain: No acute infarct or intracranial hemorrhage. Mild punctate and patchy white matter changes most likely related to result of chronic microvascular disease. No intracranial mass or abnormal enhancement. Mild global atrophy without hydrocephalus. Vascular: Major intracranial vascular structures are patent. Skull and upper cervical spine: Abnormal bone marrow upper cervical spine with mild loss height of C3. Slightly speckled appearance of portions of the clivus. Findings may reflect changes of infiltration by myeloma as versus result of anemia. Sinuses/Orbits: Post lens replacement without acute orbital abnormality. Minimal mucosal thickening ethmoid sinus air cells. Partial opacification right mastoid air cells. Other: Negative IMPRESSION: Exam is slightly motion degraded. No acute infarct or intracranial hemorrhage. Mild chronic microvascular changes. No intracranial mass or abnormal enhancement. Mild  global atrophy without hydrocephalus. Abnormal bone marrow upper cervical spine with mild loss of height of C3. Slightly speckled appearance of portions of the clivus. Findings may reflect changes of infiltration by myeloma as versus result of anemia. Electronically Signed   By: SGenia DelM.D.   On: 04/30/2016 09:44    ASSESSMENT AND PLAN:   Active Problems:   Multiple myeloma in relapse (HCC)   Pancytopenia (HCC)   Healthcare-associated pneumonia   Elevated transaminase level   SIRS (systemic inflammatory response syndrome) (HCC)   Protein-calorie malnutrition, severe  * Sepsis with HCAP   Likely Aspiration pneumonia       Appreciated ID consult, try to get sputum cx    Abx changed to Zosyn+ azithromycin. stoped azithromycin today.    Also ordered AFB culture. ID suggested to have bronch, viral and fungal tests, if he does not improve.    SLP eval done - now said- pt is able to swallow safe.  * metabolic encephalopathy   Due to healthcare associated pneumonia and malnutrition   Also under effect of pain medications.   Completely alert and oriented.  * Metastatic multiple myeloma   Patient looks to have very poor prognosis.   Palliative care appreciated but family still wanted to give it a try if he can tolerate chemotherapy. Dr. BVicenta Alysuggested if patient has improvement in his nutrition then he may try chemotherapy agents which have given him promise in the results and he may have a few years survival with good quality of life.    * Severe malnutrition   Dietary consult, on TPN.   He may need to address feeding issue for long-term so called GI consult.   Appreciated GI consult, because patient has thrombocytopenia and active infection currently not a candidate for any procedure. Reevaluate in the future if he improves.   As per repeat SLP eval- he can  swallow, advised to have small sips and small amount at a time.  encouraged oral intake.  * Pancytopenia   Oncology on  the case, keep monitoring.   Had thrombocytopenia and anemia    On 04/29/16- transfused Plt and PRBC. Stable.  All the records are reviewed and case discussed with Care Management/Social Workerr. Management plans discussed with the patient, his daughter and son, and they are in agreement.  CODE STATUS: full  TOTAL TIME TAKING CARE OF THIS PATIENT: 36 minutes.   I had detailed discussion with the patient's daughter and son in the room and discussed the case with oncologist, palliative care nurse practitioner .  POSSIBLE D/C IN 2-3 DAYS, DEPENDING ON CLINICAL CONDITION.   Demetrios Loll M.D on 05/01/2016   Between 7am to 6pm - Pager - [phone]  After 6pm go to [email or web address] - password [email or web address] [facility]  Office  [phone]  CC: Primary care physician; Alfonse Flavors, MD  Note: This dictation was prepared with Dragon dictation along with smaller phrase technology. Any transcriptional errors that result from this process are unintentional.

## 2016-05-01 NOTE — Progress Notes (Signed)
Francisco [name] reports feeling feverish with intermittent chills and sweats. However, during these episodes, no fever has been documented. Currently he is sitting up, appears slightly tachypenia, oxygen saturation has been maintained at 98% with oxyegen suppleemnation  He denies nay chest pain, no diarrhea, no pain On exam, no jaundice, no petechaie, pallor was noted No oral thrush regual rhythm Abdomen is soft, non tender, non distended Extermiteis are withotu edema  CBC    Component Value Date/Time   WBC 8.6 05/01/2016 0817   RBC 3.38 (L) 05/01/2016 0817   HGB 9.8 (L) 05/01/2016 0817   HCT 28.5 (L) 05/01/2016 0817   PLT 44 (L) 05/01/2016 0817   MCV 84.4 05/01/2016 0817   MCH 28.9 05/01/2016 0817   MCHC 34.2 05/01/2016 0817   RDW 16.8 (H) 05/01/2016 0817   LYMPHSABS 1.1 05/01/2016 0817   MONOABS 0.8 05/01/2016 0817   EOSABS 0.0 05/01/2016 0817   BASOSABS 0.2 (H) 05/01/2016 0817       Vitals:   04/30/16 2008 05/01/16 0349 05/01/16 0500 05/01/16 1313  BP: 133/74 123/77  123/77  Pulse: 64 66  77  Resp: 20     Temp: 98.5 F (36.9 C) 97.6 F (36.4 C)  98 F (36.7 C)  TempSrc: Oral Oral  Oral  SpO2: 98% 100%  98%  Weight:   105 lb (47.6 kg)   Height:       CMP Latest Ref Rng & Units 05/01/2016 04/30/2016 04/29/2016  Glucose 65 - 99 mg/dL 142(H) 147(H) 119(H)  BUN 6 - 20 mg/dL 19 23(H) 23(H)  Creatinine 0.61 - 1.24 mg/dL 0.57(L) 0.55(L) 0.70  Sodium 135 - 145 mmol/L 132(L) 141 149(H)  Potassium 3.5 - 5.1 mmol/L 3.9 3.9 3.5  Chloride 101 - 111 mmol/L 105 113(H) 123(H)  CO2 22 - 32 mmol/L _0 Calcium 8.9 - 10.3 mg/dL 7.4(L) 7.5(L) 7.5(L)  Total Protein 6.5 - 8.1 g/dL - 5.2(L) 4.9(L)  Total Bilirubin 0.3 - 1.2 mg/dL - 0.8 0.4  Alkaline Phos 38 - 126 U/L - 164(H) 164(H)  AST 15 - 41 U/L - 63(H) 41  ALT 17 - 63 U/L - 75(H) 61      Impression and Plan:  Community acquired pneumonia in an immunocompromised host  With several risk factors including poor lung  reserve from COPD AND past TB related changes Immunosuppression due to multiple myeloma, poor bone marrow reserve and severe malnutrition. He has been seen by Id, initially placed on Zosyn  and Vanco, subsequently Vanco d/ced, he is now on monotherapy with Zosyn.  His clinical status appears to be improving slowly, but his complains of chills and feverishness withoot documented temeperature elevation is concerning . I will request a stat CXR, and continue to monitor closely.  Expansile lytic rib and throracic vertebral lesions, the left lower lobe consolidation appears contiguos tot he ninth rib lesion on the scans. I will request pulmonary evaluation. Elevated ALT is concerning as well, differerntial includes infectious causes as well as infiltrative cause.  I have discontiued all tylenol containing meds, will use sparingly.  Unfortunately ID coverage is not available today, we will request a reconsult tomorrow am.

## 2016-05-01 NOTE — Progress Notes (Signed)
Pt refused labs this morning despite explanation of need for blood work. Dorna Bloom RN

## 2016-05-01 NOTE — Plan of Care (Signed)
Problem: Education: Goal: Knowledge of  General Education information/materials will improve Outcome: Not Progressing Family not compliant with infection control.  Asked family to take off mask and gloves wore in room before coming out to nurses station.  Family aware isolation precautions not required in room any longer. Pt declining medication and treatments.  Pt states he is "too weak" for blood draw.  Explained that it is needed to monitor his platelets, hemaglobin, white blood count and electrolytes for TPN.  Pt also refuses scheduled antibiotic and solumedrol.  Requesting that these be given later in the morning.  Explained to pt that antibiotics work best when given on the prescribed schedule but he still refused medication. Pt refused pepcid IV last evening stating that his stomach did not hurt at the time.  Pt then required zofran later in the shift for nausea.  Problem: Pain Managment: Goal: General experience of comfort will improve Outcome: Progressing No pain medication given this shift  Problem: Skin Integrity: Goal: Risk for impaired skin integrity will decrease Outcome: Not Progressing Pt declines turning every 2 hours and remains in supine position at all times.

## 2016-05-01 NOTE — Progress Notes (Signed)
Nutrition Brief Note  Discussed briefly with Dr. Sherrine Maples, RN Shae patient's D5, requested it be held with Na 132. Full TPN follow-up tomorrow.   Satira Anis. Ayce Pietrzyk, MS, RD LDN Inpatient Clinical Dietitian Pager [phone]

## 2016-05-02 ENCOUNTER — Ambulatory Visit: Payer: Medicare Other

## 2016-05-02 DIAGNOSIS — E43 Unspecified severe protein-calorie malnutrition: Secondary | ICD-10-CM | POA: Diagnosis not present

## 2016-05-02 DIAGNOSIS — J189 Pneumonia, unspecified organism: Secondary | ICD-10-CM | POA: Diagnosis not present

## 2016-05-02 DIAGNOSIS — A419 Sepsis, unspecified organism: Secondary | ICD-10-CM | POA: Diagnosis not present

## 2016-05-02 DIAGNOSIS — R4182 Altered mental status, unspecified: Secondary | ICD-10-CM | POA: Diagnosis not present

## 2016-05-02 LAB — CBC WITH DIFFERENTIAL/PLATELET
BLASTS: 0 %
Band Neutrophils: 2 %
Basophils Absolute: 0 10*3/uL (ref 0–0.1)
Basophils Relative: 0 %
EOS ABS: 0 10*3/uL (ref 0–0.7)
Eosinophils Relative: 0 %
HEMATOCRIT: 29.7 % — AB (ref 40.0–52.0)
HEMOGLOBIN: 10.4 g/dL — AB (ref 13.0–18.0)
LYMPHS PCT: 8 %
Lymphs Abs: 0.8 10*3/uL — ABNORMAL LOW (ref 1.0–3.6)
MCH: 29 pg (ref 26.0–34.0)
MCHC: 34.9 g/dL (ref 32.0–36.0)
MCV: 83.2 fL (ref 80.0–100.0)
MONOS PCT: 8 %
Metamyelocytes Relative: 1 %
Monocytes Absolute: 0.8 10*3/uL (ref 0.2–1.0)
Myelocytes: 3 %
NEUTROS PCT: 78 %
NRBC: 0 /100{WBCs}
Neutro Abs: 7.8 10*3/uL — ABNORMAL HIGH (ref 1.4–6.5)
OTHER: 0 %
PROMYELOCYTES ABS: 0 %
Platelets: 61 10*3/uL — ABNORMAL LOW (ref 150–440)
RBC: 3.57 MIL/uL — AB (ref 4.40–5.90)
RDW: 16.6 % — ABNORMAL HIGH (ref 11.5–14.5)
WBC: 9.4 10*3/uL (ref 3.8–10.6)

## 2016-05-02 LAB — BASIC METABOLIC PANEL
ANION GAP: 5 (ref 5–15)
BUN: 20 mg/dL (ref 6–20)
CHLORIDE: 105 mmol/L (ref 101–111)
CO2: 21 mmol/L — AB (ref 22–32)
Calcium: 7.6 mg/dL — ABNORMAL LOW (ref 8.9–10.3)
Creatinine, Ser: 0.52 mg/dL — ABNORMAL LOW (ref 0.61–1.24)
GFR calc non Af Amer: >60 mL/min (ref >60)
GLUCOSE: 130 mg/dL — AB (ref 65–99)
Potassium: 4.2 mmol/L (ref 3.5–5.1)
Sodium: 131 mmol/L — ABNORMAL LOW (ref 135–145)

## 2016-05-02 LAB — GLUCOSE, CAPILLARY
GLUCOSE-CAPILLARY: 133 mg/dL — AB (ref 65–99)
GLUCOSE-CAPILLARY: 141 mg/dL — AB (ref 65–99)
Glucose-Capillary: 150 mg/dL — ABNORMAL HIGH (ref 65–99)
Glucose-Capillary: 140 mg/dL — ABNORMAL HIGH (ref 65–99)
Glucose-Capillary: 153 mg/dL — ABNORMAL HIGH (ref 65–99)
Glucose-Capillary: 149 mg/dL — ABNORMAL HIGH (ref 65–99)
Glucose-Capillary: 157 mg/dL — ABNORMAL HIGH (ref 65–99)

## 2016-05-02 LAB — PHOSPHORUS
PHOSPHORUS: 2.5 mg/dL (ref 2.5–4.6)
Phosphorus: 2.7 mg/dL (ref 2.5–4.6)

## 2016-05-02 LAB — ECHOCARDIOGRAM COMPLETE
Height: 70 in
Weight: 1680 oz

## 2016-05-02 LAB — MAGNESIUM: Magnesium: 1.8 mg/dL (ref 1.7–2.4)

## 2016-05-02 MED ORDER — DEXTROSE 5 % IV SOLN
10.0000 mmol | Freq: Once | INTRAVENOUS | Status: DC
  Filled 2016-05-02: qty 3.33

## 2016-05-02 MED ORDER — MAGNESIUM SULFATE 2 GM/50ML IV SOLN
2.0000 g | Freq: Once | INTRAVENOUS | Status: AC
  Administered 2016-05-02: 2 g via INTRAVENOUS
  Filled 2016-05-02: qty 50

## 2016-05-02 MED ORDER — TRACE MINERALS CR-CU-MN-SE-ZN 10-1000-500-60 MCG/ML IV SOLN
INTRAVENOUS | Status: AC
  Administered 2016-05-02: 18:00:00 via INTRAVENOUS
  Filled 2016-05-02: qty 1320

## 2016-05-02 MED ORDER — FAT EMULSION 20 % IV EMUL
500.0000 mL | INTRAVENOUS | Status: AC
  Administered 2016-05-02: 18:00:00 500 mL via INTRAVENOUS
  Filled 2016-05-02: qty 500

## 2016-05-02 MED ORDER — ENSURE ENLIVE PO LIQD
237.0000 mL | Freq: Two times a day (BID) | ORAL | Status: DC
  Administered 2016-05-02 – 2016-05-04 (×4): 237 mL via ORAL

## 2016-05-02 MED ORDER — IPRATROPIUM-ALBUTEROL 0.5-2.5 (3) MG/3ML IN SOLN: 3.0000 mL | Freq: Four times a day (QID) | RESPIRATORY_TRACT | Status: DC | PRN

## 2016-05-02 MED ORDER — K PHOS MONO-SOD PHOS DI & MONO 155-852-130 MG PO TABS
500.0000 mg | ORAL_TABLET | ORAL | Status: DC
  Administered 2016-05-02: 15:00:00 500 mg via ORAL
  Filled 2016-05-02 (×2): qty 2

## 2016-05-02 NOTE — Consult Note (Signed)
Patient not a candidate for PEG tube at this time.  I will sign off.

## 2016-05-02 NOTE — Care Management (Signed)
Physical therapy evaluation completed. Recommends skilled nursing. Family wants to take Francisco [name] home. Spoke with daughter and friend at the bedside. Francisco [name] has Medicaid as his insurance. Medicaid will not pay for physical therapy in the home.  Discussed having the physical therapist show the family/friends exercises to do in the home. Also, gave personal care agency list. [employer] telephone and address to daughter.  Will have [facility] Nurse to return and monitor the TPN.  Shelbie Ammons RN MSN CCM Care Management [phone]

## 2016-05-02 NOTE — Consult Note (Signed)
PHARMACY - ADULT TOTAL PARENTERAL NUTRITION CONSULT NOTE   Pharmacy Consult for TPN Indication:Severe malnutrition in context of chronic illness  Patient Measurements: Height: 5\' 10"  (177.8 cm) Weight: 105 lb (47.6 kg) IBW/kg (Calculated) : 73 TPN AdjBW (KG): 42.7 Body mass index is 15.07 kg/m.   Assessment:   GI: not a candidate for PEG tube placement at this time Endo: Insulin requirements in the past 24 hours: 5 units (currently receiving solu-medrol 40 mg q 12) Renal: Pulm: possible aspiration PNA on day 6 of full piperacillin/tazobactam 3.375 g IV therapy Cards: Hepatobil: Neuro: ID: possible aspiration PNA on piperacillin/tazobactam  Best Practices: TPN Access:PICC TPN start date: started at previous admission this month, restarted 10/18  Nutritional Goals (per RD recommendation on 10/23): 1590 kcal, 66 grams protein, 1320 ml fluid with total calories from lipids averaged over 7 days   Current Nutrition:   TPN Plan:  Continue TPN of Clinimix 5/20 at goal rate of 28ml/hr, initiating ILE 20% @ 50 ml/hour x 10 hours to start with TPN at 1800. Continue to monitor K, phos, Mg closley as pt is at risk for refeeding. Per RD, will check TG 10/24 am All electrolytes in the AM Strict I/O, daily weights, CBG q 4hr checks with sensitive SSI ordered.   Electrolytes: 10/22 23:30 PO4 2.7 No supplementation ordered. Recheck labs in AM.  10/23 1000 K = 4.2; Na = 131; corrected ca = 9.2; mag = 1.7; phos = 2.5 Pt received mag 2 g IV x 1 and kphos 500 mg PO x 1 Will recheck electrolytes with am labs  Darrow Bussing, PharmD Pharmacy Resident 05/02/2016 4:44 PM

## 2016-05-02 NOTE — Plan of Care (Signed)
Problem: Education: Goal: Knowledge of [facility] Education information/materials will improve Outcome: Progressing Ed to pt and family  Problem: Nutrition: Goal: Adequate nutrition will be maintained Outcome: Not Progressing Pt on tpn via picc. Taking small amt  Orally.

## 2016-05-02 NOTE — Consult Note (Signed)
PULMONARY / CRITICAL CARE MEDICINE   Name: Francisco [name] MRN: [id number] DOB: [birth year]    ADMISSION DATE:  04/25/2016   CONSULTATION DATE:  05/01/2016  REFERRING MD: Dr. 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 He was found to have pneumonia and started on empiric antibiotics. His mental status has slightly improved. His MRI brain was negative for any infarct or new lesions but showed infiltrative changes consistent with multiple myeloma. Stop His CT scan of the chest from 04/27/2016 and subsequent chest x-rays suggested bilateral lower lobe pneumonia, emphysematous changes and multiple bone lesions within the thoracic spine as well as compression fractures at the level of T4, T9 and T10. There was also concern for an expansile destructive lesion of the left ninth rib. PCM was consulted to evaluate the infiltrate and also to determine if patient had a plasmacytoma at the level of the left lower and middle lobes.  He is tachypneic but maintaining his oxygen saturation in the 100s on room air. He reports generalized pain, anorexia and generalized weakness. Patient has been bed confined for the last couple of weeks and his appetite is extremely poor. He is currently on TPN. He was seen by oncology and it was suggested that given his poor nutritional status, he will not be able to continue with chemotherapy and radiation therapy at this point.   PAST MEDICAL HISTORY :  He  has a past medical history of Anemia; Cancer ([place]); Constipation; Hearing loss; Hoarse voice quality; Liver lesion; Malaria (2003); Multiple myeloma ([place]) (03/22/2016); and Tuberculosis (1985).  PAST SURGICAL  HISTORY: He  has a past surgical history that includes Retinal detachment surgery (2006).  No Known Allergies  No current facility-administered medications on file prior to encounter.    Current Outpatient Prescriptions on File Prior to Encounter  Medication Sig  . acyclovir (ZOVIRAX) 400 MG tablet Take 1 tablet (400 mg total) by mouth daily.  Marland Kitchen alum & mag hydroxide-simeth (MAALOX/MYLANTA) 200-200-20 MG/5ML suspension Take 30 mLs by mouth every 4 (four) hours as needed for indigestion or heartburn.  . cholecalciferol (VITAMIN D) 1000 units tablet Take 1,000 Units by mouth daily.  Marland Kitchen dronabinol (MARINOL) 2.5 MG capsule Take 1 capsule (2.5 mg total) by mouth 2 (two) times daily before lunch and supper.  . esomeprazole (NEXIUM) 40 MG capsule Take 1 capsule (40 mg total) by mouth 2 (two) times daily before a meal.  . fentaNYL (DURAGESIC - DOSED MCG/HR) 12 MCG/HR Place 1 patch (12.5 mcg total) onto the skin every 3 (three) days.  Marland Kitchen HYDROcodone-acetaminophen (NORCO/VICODIN) 5-325 MG tablet Take 1 tablet by mouth every 4 (four) hours as needed for moderate pain.  Marland Kitchen lactulose (CHRONULAC) 10 GM/15ML solution Take 30 mLs (20 g total) by mouth 2 (two) times daily as needed for mild constipation, moderate constipation or severe constipation.  . magnesium hydroxide (MILK OF MAGNESIA) 400 MG/5ML suspension Take 5 mLs by mouth daily as needed for mild constipation.  . ondansetron (ZOFRAN ODT) 8 MG disintegrating tablet Take 1 tablet (8 mg total) by mouth every 8 (eight) hours as needed for nausea or vomiting.  . ondansetron (ZOFRAN) 8 MG tablet Take 1 tablet (8 mg total) by mouth every 8 (  eight) hours as needed for nausea or vomiting.  . phosphorus (K-PHOS-NEUTRAL) 155-852-130 MG tablet Take 1 tablet (250 mg total) by mouth 2 (two) times daily.  . prochlorperazine (COMPAZINE) 10 MG tablet Take 1 tablet (10 mg total) by mouth every 6 (six) hours as needed for nausea or vomiting.  . sertraline (ZOLOFT) 25 MG  tablet Take 1 tablet (25 mg total) by mouth daily.  . sucralfate (CARAFATE) 1 g tablet Take 1 tablet (1 g total) by mouth 4 (four) times daily -  with meals and at bedtime.  . thiamine (VITAMIN B-1) 100 MG tablet Take 1 tablet (100 mg total) by mouth daily.  . vitamin B-12 (CYANOCOBALAMIN) 1000 MCG tablet Take 1,000 mcg by mouth daily.  . Vitamin D, Ergocalciferol, (DRISDOL) 50000 units CAPS capsule Take 50,000 Units by mouth every 7 (seven) days.    FAMILY HISTORY:  His has no family status information on file.    SOCIAL HISTORY: He  reports that he has never smoked. He has never used smokeless tobacco. He reports that he does not drink alcohol or use drugs.  REVIEW OF SYSTEMS:   Constitutional: Negative for fever but reports intermittent chills.  HENT: Negative for congestion and rhinorrhea.  Eyes: Negative for redness and visual disturbance.  Respiratory: Positive for shortness of breath and wheezing.  Cardiovascular: Negative for chest pain and palpitations.  Gastrointestinal: Positive  for nausea but negative for vomiting and abdominal pain and  loose stools Genitourinary: Negative for dysuria and urgency.  Endocrine: Denies polyuria, polyphagia and heat intolerance Musculoskeletal: Positive for for myalgias and arthralgias.  Skin: Negative for pallor and wound.  Neurological: Positive for dizziness and occasional confusion but negative for headaches   SUBJECTIVE:   VITAL SIGNS: BP 112/72 (BP Location: Left Arm)   Pulse 99   Temp 97.8 F (36.6 C) (Oral)   Resp 18   Ht _0  (1.778 m)   Wt 105 lb (47.6 kg)   SpO2 100%   BMI 15.07 kg/m   HEMODYNAMICS:    VENTILATOR SETTINGS:    INTAKE / OUTPUT: I/O last 3 completed shifts: In: 3598.3 [P.O.:50; I.V.:3398.3; IV Piggyback:150] Out: -   PHYSICAL EXAMINATION: General: Frail looking male, appears older for age Neuro: Alert to person and place, speaks with a very subtle voice, significantly decreased muscle  strength in upper and lower extremities HEENT: Most cephalic and atraumatic, PERRLA, oral mucosa dry, trachea midline Cardiovascular: RRR, S1, S2 audible. No murmur, regurg or gallop Lungs: Increased work of breathing, bilateral airflow, but sounds significantly diminished in the bases, left greater than right, diffuse rhonchi in upper lung fields Abdomen: Nondistended, normal bowel sounds, palpation reveals no organomegaly Musculoskeletal: Positive range of motion in lower and upper extremities, no visible deformities Extremities: +2 pulses bilaterally, no edema Skin: Warm and dry, poor turgor.  LABS:  BMET  Recent Labs Lab 04/29/16 0637 04/30/16 0750 05/01/16 0817  NA 149* 141 132*  K 3.5 3.9 3.9  CL 123* 113* 105  CO2 _1 BUN 23* 23* 19  CREATININE 0.70 0.55* 0.57*  GLUCOSE 119* 147* 142*    Electrolytes  Recent Labs Lab 04/29/16 0637  04/30/16 0750 04/30/16 2146 05/01/16 0817 05/01/16 2334  CALCIUM 7.5*  --  7.5*  --  7.4*  --   MG 1.9  --  1.9  --  1.8  --   PHOS 2.1*  < > 2.2* 3.1 2.3* 2.7  < > = values in this interval not displayed.  CBC  Recent Labs Lab 04/29/16 0637 04/30/16 0750 05/01/16 0817  WBC 4.6 5.9 8.6  HGB 7.9* 9.3* 9.8*  HCT 23.7* 27.6* 28.5*  PLT 22* 46* 44*    Coag's  Recent Labs Lab 04/25/16 1900  INR 1.52    Sepsis Markers  Recent Labs Lab 04/25/16 1632  LATICACIDVEN 1.3    ABG No results for input(s): PHART, PCO2ART, PO2ART in the last 168 hours.  Liver Enzymes  Recent Labs Lab 04/28/16 1002 04/29/16 0637 04/30/16 0750  AST 31 41 63*  ALT 70* 61 75*  ALKPHOS 142* 164* 164*  BILITOT 0.6 0.4 0.8  ALBUMIN 1.8* 1.8* 1.9*    Cardiac Enzymes No results for input(s): TROPONINI, PROBNP in the last 168 hours.  Glucose  Recent Labs Lab 04/30/16 2346 05/01/16 0353 05/01/16 0752 05/01/16 1130 05/01/16 1635 05/01/16 2000  GLUCAP 159* 152* 142* 140* 135* 140*    Imaging Dg Chest 2 View  Result  Date: 05/01/2016 CLINICAL DATA:  Pneumonia, wheezing EXAM: CHEST  2 VIEW COMPARISON:  04/29/2016 FINDINGS: Cardiomediastinal silhouette is stable. Persistent left base retrocardiac atelectasis or infiltrate. Mild hyperinflation. No pulmonary edema. IMPRESSION: Persistent left base retrocardiac atelectasis or infiltrate. No pulmonary edema. Electronically Signed   By: Lahoma Crocker M.D.   On: 05/01/2016 14:27   Dg Chest 2 View  Result Date: 04/29/2016 CLINICAL DATA:  Cough.  Possible aspiration.  Multiple myeloma. EXAM: CHEST  2 VIEW COMPARISON:  04/25/2016 FINDINGS: Right arm PICC line remains in appropriate position. Heart size is within normal limits. Pulmonary hyperinflation remains stable. No evidence of pneumothorax. Airspace disease with air bronchograms again seen in the medial left lower lobe, suspicious for pneumonia. There is also mild symmetric bilateral perihilar airspace disease, suspicious for mild pulmonary edema. Small layering posterior pleural effusion noted. Pulmonary hyperinflation again demonstrated. IMPRESSION: Persistent medial left lower lobe airspace disease and tiny posterior pleural effusion, suspicious for pneumonia. Mild symmetric bilateral perihilar airspace opacity, suspicious for pulmonary edema. Stable cardiomegaly and pulmonary hyperinflation. Electronically Signed   By: Earle Gell M.D.   On: 04/29/2016 16:47   Dg Abd 1 View  Result Date: 04/29/2016 CLINICAL DATA:  Pt has abdominal distention with complaints of some pain. Nonsmoker. Hx of TB. Hx od constipation. Additional history of multiple myeloma for clinical data provided on PET-CT report of 03/29/2016. EXAM: ABDOMEN - 1 VIEW COMPARISON:  None. FINDINGS: Overall bowel gas pattern is nonobstructive. Fairly large amount of gas within the colon. Questionable thickening of the walls of a small bowel loop in the pelvis. No evidence of soft tissue mass or abnormal fluid collection. No evidence of free intraperitoneal air. No  pathologic appearing calcifications seen. IMPRESSION: Nonobstructive bowel gas pattern. Fairly large amount of gas within the colon. Questionable thickening of the walls of a small bowel loop in the pelvis (enteritis? ). Electronically Signed   By: Franki Cabot M.D.   On: 04/29/2016 08:59   Mr Jeri Cos ZO Contrast  Result Date: 04/30/2016 CLINICAL DATA:  70 year old male with history of multiple myeloma, malaria and tuberculosis presenting with decreased oral intake and altered mental status. Subsequent encounter. EXAM: MRI HEAD WITHOUT AND WITH CONTRAST TECHNIQUE: Multiplanar, multiecho pulse sequences of the brain and surrounding structures were obtained without and with intravenous contrast. CONTRAST:  58m MULTIHANCE GADOBENATE DIMEGLUMINE 529 MG/ML IV SOLN COMPARISON:  04/25/2016 head CT.  No comparison brain MR. FINDINGS: Exam is slightly motion degraded. Brain: No acute infarct or intracranial hemorrhage. Mild punctate and patchy white matter  changes most likely related to result of chronic microvascular disease. No intracranial mass or abnormal enhancement. Mild global atrophy without hydrocephalus. Vascular: Major intracranial vascular structures are patent. Skull and upper cervical spine: Abnormal bone marrow upper cervical spine with mild loss height of C3. Slightly speckled appearance of portions of the clivus. Findings may reflect changes of infiltration by myeloma as versus result of anemia. Sinuses/Orbits: Post lens replacement without acute orbital abnormality. Minimal mucosal thickening ethmoid sinus air cells. Partial opacification right mastoid air cells. Other: Negative IMPRESSION: Exam is slightly motion degraded. No acute infarct or intracranial hemorrhage. Mild chronic microvascular changes. No intracranial mass or abnormal enhancement. Mild global atrophy without hydrocephalus. Abnormal bone marrow upper cervical spine with mild loss of height of C3. Slightly speckled appearance of  portions of the clivus. Findings may reflect changes of infiltration by myeloma as versus result of anemia. Electronically Signed   By: Genia Del M.D.   On: 04/30/2016 09:44   STUDIES:  None  CULTURES: Blood cultures 10/162017 -no growth to date.  Sputum culture-pending collection MRSA screen negative  ANTIBIOTICS: Zosyn 04/26/2016 for possible aspiration pneumonia  SIGNIFICANT EVENTS: 04/25/2016: Admitted with altered mental status, sepsis due to pneumonia and pancytopenia  LINES/TUBES: Right PICC line  DISCUSSION: This is a 70 year old male presenting with persistent left lower lobe pneumonia despite treatment, resolving sepsis due to pneumonia, severe protein calorie malnutrition, and multiple myeloma. His CT chest and chest x-rays suggests diffuse emphysematous changes as well as persistent left lower lobe consolidation. Unable to obtain sputum culture as patient is not able to cough up any secretions due to pain and severe frailty due to malnutrition. He could benefit from a diagnostic bronchoscopy, but he is too frail to undergo one this point.   ASSESSMENT Left lower lobe pneumonia Emphysema History of TB History of multiple myeloma with possible lung infiltration  PLAN Continue current antibiotics Aspiration precautions Nebulized bronchodilators as needed for wheezing and shortness of breath We'll consider diagnostic bronchoscopy if patient's clinical status improves to the point where he can tolerate the bronch  Plan of care discussed with patient's daughter at length at bedside  TOTAL TIME=50 MINUTES  Magdalene S. [facility] ANP-BC Pulmonary and Critical Care Medicine [facility] Pager [phone] or [phone]   05/02/2016, 5:58 AM   PCCM ATTENDING ATTESTATION:  I have evaluated patient with the APP [place], reviewed database in its entirety and discussed care plan in detail.   In summary, he is a very frail, cachectic man with a prior history of  TB (1980s) and recent diagnosis of MM. He was unable to tolerate the initial attempt @ chemotherapy. Admitted 10/16 with N/V/D, anorexia, CXR compatible with PNA. He has been treated with abx and is on TPN.   Important exam findings: Mildly tachypneic @ rest Cachectic and very weak Bronchial BS in LLL Reg, no M Abd scaphoid and NT No LE edema  Xrays as documented above have been personally reviewed  AFB smears have been sent  Major problems addressed by PCCM team: Pulmonary infiltrates L>R - most compatible with PNA The radiographic appearance is not suggestive of re-activation TB or of tumor (plasmacytoma) Bronchoscopy would present significant risk due to his frail condition and would not likely yield any new information that would change overall outcome   PLAN/REC: No bronchoscopy for now Complete therapy for HCAP - given immunocompromised state, would treat 10-14 days minimum or as directed by ID F/U on AFB specimens already sent Recheck CXR in 2-3 weeks  PCCM will sign off. Please call if we can be of further assistance  Merton Border, MD PCCM service Mobile [phone] Pager [phone]

## 2016-05-02 NOTE — Progress Notes (Signed)
Pharmacy Antibiotic Note  Francisco [name] is a 70 y.o. male admitted on 04/25/2016 with sepsis.  Pharmacy has been consulted for piperacillin/tazobactam dosing for aspiration pneumonia.   Plan: Currently on day 6 of full piperacillin/tazobactam 3.375g q 8 hr EI therapy. Unable to gather sufficient sputum culture from patient. Will continue to monitor patient's improvement.    Temp (24hrs), Avg:98 F (36.7 C), Min:97.8 F (36.6 C), Max:98.2 F (36.8 C)   Recent Labs Lab 04/28/16 1002 04/28/16 1003 04/29/16 0637 04/30/16 0750 05/01/16 0817 05/02/16 1003  WBC 3.9  --  4.6 5.9 8.6 9.4  CREATININE 0.73  --  0.70 0.55* 0.57* 0.52*  VANCOTROUGH  --  7*  --   --   --   --     Estimated Creatinine Clearance: 57 mL/min (by C-G formula based on SCr of 0.52 mg/dL (L)).    No Known Allergies  Antimicrobials this admission: Vancomycin  10/16 >> 10/19 Cefepime  10/16 >> 10/17 Acyclovir 10/16 >> 10/16 Zosyn 10/17>> Azithromycin 10/17>> 10/20  Dose adjustments this admission:  Microbiology results: 10/16 BCx: no growth 10/17 MRSA PCR: negative   Thank you for allowing pharmacy to be a part of this patient's care.  Darrow Bussing, PharmD Pharmacy Resident 05/02/2016 4:54 PM

## 2016-05-02 NOTE — Evaluation (Signed)
Physical Therapy Evaluation Patient Details Name: Francisco [name] MRN: [id number] DOB: [birth year] Today's Date: 05/02/2016   History of Present Illness  Pt admitted for complaints of AMS, however appears to be at baseline orientation at this time. Pt with history of multiple myeloma, currently on radiation therapy. History of TB.  Clinical Impression  Pt is a pleasant 70 year old complaints of AMS and pancytopenia. Pt performs bed mobility with mod assist to get to EOB. Unable to perform further OOB mobility this date. Pt demonstrates deficits with strength/mobility/endurance. Demonstrates there-ex to daughter and family and educated on benefits of continued there-ex during hospital stay. Per family, they are able to provide in home assistance to patient, however would like RN and HHPT. Not interested in facility placement at this time. Would benefit from skilled PT to address above deficits and promote optimal return to PLOF.       Follow Up Recommendations Home health PT;Supervision/Assistance - 24 hour    Equipment Recommendations       Recommendations for Other Services       Precautions / Restrictions Precautions Precautions: Fall Restrictions Weight Bearing Restrictions: No      Mobility  Bed Mobility Overal bed mobility: Needs Assistance Bed Mobility: Supine to Sit     Supine to sit: Mod assist     General bed mobility comments: assist for attempted bed mobility towards L side of bed. Assist required for sequencing and trunk support. Once seated at EOB, pt very dizzy, able to sit approx 5 minutes. Pt then needs max assist for scooting up towards [facility]  Transfers                 General transfer comment: unable at this time  Ambulation/Gait                Stairs            Wheelchair Mobility    Modified Rankin (Stroke Patients Only)       Balance Overall balance assessment: Needs assistance Sitting-balance support: Feet  supported Sitting balance-Leahy Scale: Poor                                       Pertinent Vitals/Pain Pain Assessment: No/denies pain    Home Living Family/patient expects to be discharged to:: Private residence Living Arrangements: Spouse/significant other (children visiting from [facility]) Available Help at Discharge: Family Type of Home: House Home Access: Level entry     Home Layout: One level Home Equipment: Environmental consultant - 2 wheels;Wheelchair - manual      Prior Function Level of Independence: Needs assistance         Comments: earlier this month he was able to walk, however past few days prior to admission, only able to ambulate to recliner chair/toilet with RW     Hand Dominance        Extremity/Trunk Assessment   Upper Extremity Assessment: Generalized weakness (B UE grossly 3+/5)           Lower Extremity Assessment: Generalized weakness (B LE grossly 3/5)         Communication   Communication: No difficulties  Cognition Arousal/Alertness: Awake/alert Behavior During Therapy: WFL for tasks assessed/performed Overall Cognitive Status: Within Functional Limits for tasks assessed                      General Comments  Exercises Other Exercises Other Exercises: Supine ther-ex performed including B LE SLRs and hip abd/add. All ther-ex performed x 10 reps with min assist. Pt also able to sit at EOB for a few min with min assist and able to progress to only requiring cga. Pt fatigues quickly.   Assessment/Plan    PT Assessment Patient needs continued PT services  PT Problem List Decreased strength;Decreased activity tolerance;Decreased balance;Decreased mobility;Decreased knowledge of use of DME          PT Treatment Interventions Gait training;DME instruction;Stair training;Functional mobility training;Therapeutic activities;Therapeutic exercise;Balance training;Neuromuscular re-education;Patient/family education    PT Goals  (Current goals can be found in the Care Plan section)  Acute Rehab PT Goals Patient Stated Goal: go home PT Goal Formulation: With family Time For Goal Achievement: 05/16/16 Potential to Achieve Goals: Fair    Frequency Min 2X/week   Barriers to discharge        Co-evaluation               End of Session Equipment Utilized During Treatment: Gait belt Activity Tolerance: Patient limited by fatigue Patient left: with bed alarm set;with call bell/phone within reach;with family/visitor present Nurse Communication: Mobility status         Time: 1013-1029 PT Time Calculation (min) (ACUTE ONLY): 16 min   Charges:   PT Evaluation $PT Eval Moderate Complexity: 1 Procedure PT Treatments $Therapeutic Exercise: 8-22 mins   PT G Codes:        Tayleigh Wetherell May 16, 2016, 11:05 AM Greggory Stallion, PT, DPT [phone]

## 2016-05-02 NOTE — Progress Notes (Signed)
Nutrition Follow-up  DOCUMENTATION CODES:   Severe malnutrition in context of chronic illness  INTERVENTION:  Continue TPN of Clinimix 5/20 at goal rate of 55 ml/hr. As patient tolerating goal rate, recommend initiating ILE 20% @ 50 ml/hr x 10 hours tonight at 1800. Can run every M/W/F as tolerated. Goal TPN infusion provides 1590 kcal, 66 grams protein, 1320 ml fluid with total calories from lipids averaged over 7 days.  Recommend checking TG tomorrow (10/24). Will continue to monitor Sodium trend. If continues to trend down, patient may require Normal Saline infusion to correct.  Continue Boost Breeze po TID, each supplement provides 250 kcal and 9 grams of protein.  Ordered Ensure Enlive po BID, each supplement provides 350 kcal and 20 grams of protein (per family request).  Added note on Boost Breeze and Ensure Enlive to bring to 2 bottles to room each morning per family request so they can get to room temperature before giving to patient.  NUTRITION DIAGNOSIS:   Malnutrition related to chronic illness, cancer and cancer related treatments as evidenced by severe depletion of body fat, severe depletion of muscle mass, percent weight loss.  Ongoing.  GOAL:   Patient will meet greater than or equal to 90% of their needs  Progressing as TPN at goal rate with plan for lipids today.  MONITOR:   PO intake, Supplement acceptance, Labs, Weight trends  REASON FOR ASSESSMENT:   Malnutrition Screening Tool, Consult Assessment of nutrition requirement/status  ASSESSMENT:   70 y.o male admitted with AMS, SOB, possible pneumonia.  Pt with history of multiple myeloma, with bone mets receiving radiation and chemotherapy( 3 days ago).  Has been having diarrhea for 2 days as well.   Family present at time of assessment. Appetite is still not good. Denies N/V or abdominal pain. Reports one loose bowel movement overnight. Family reports patient has been able to drink 1/2-3/4 bottle of Ensure  slowly over each day. Also has had bites of oatmeal, cream of wheat, or lentils. Family requesting 2 bottles each of Ensure Enlive and Boost Breeze be brought to the room each morning so they can give them to him at room temperature.  TPN Clinimix E 5/20 running at goal rate 55 ml/hr at time of assessment.  Per Nutrition Brief Note yesterday, D5 fluids were stopped as patient became hyponatremic (Na was 132).  Medications reviewed and include: Colace, famotidine, Novolog sliding scale Q4hrs, methylprednisolone 40 mg Q12hrs, senna.  Labs reviewed: CBG 135-150 past 24 hrs, Sodium 131, CO2 21, Creatinine 0.52; Potassium, Phosphorus, and Magnesium all WNL. Last triglyceride on 10/18 was WNL at 125.  Weight trend: 47.6 kg on 10/22 (+4.9 kg since admission)  Urine output: 1.7 L  (1.5 ml/kg/hour) 10/22-10/23  Diet Order:  Diet regular Room service appropriate? Yes; Fluid consistency: Thin .TPN (CLINIMIX-E) Adult  Skin:  Reviewed, no issues  Last BM:  05/01/2016  Height:   Ht Readings from Last 1 Encounters:  04/25/16 5' 10"  (1.778 m)    Weight:   Wt Readings from Last 1 Encounters:  05/01/16 105 lb (47.6 kg)    Ideal Body Weight:     BMI:  Body mass index is 15.07 kg/m.  Estimated Nutritional Needs:   Kcal:  1300-1500 kcals/d  Protein:  52-65 g/d  Fluid:  >/= 1.3 L/c  EDUCATION NEEDS:   Education needs addressed  Willey Blade, MS, RD, LDN Pager: [phone] After Hours Pager: [phone]

## 2016-05-02 NOTE — Progress Notes (Signed)
[facility] NAME: Francisco [name]    MR#:  [id number]  DATE OF BIRTH:  September 08, 1945  SUBJECTIVE:  CHIEF COMPLAINT:   Chief Complaint  Patient presents with  . Altered Mental Status     Diagnosed with metastatic multiple myeloma a few months ago and have decreased oral intake with nausea and severe pain and have severe malnutrition. He was recently started on TPN 2 weeks ago, but had worsening in her overall condition and brought back with altered mental status. Found to have pneumonia.   weakness, a little oral intake.  REVIEW OF SYSTEMS:    Review of Systems  Constitutional: Positive for malaise/fatigue. Negative for fever and weight loss.       Poor oral intake.  HENT: Negative for hearing loss and sore throat.   Eyes: Negative for blurred vision and pain.  Respiratory: Negative for cough, sputum production and shortness of breath.   Cardiovascular: Negative for chest pain, palpitations and leg swelling.  Gastrointestinal: Negative for abdominal pain, diarrhea, heartburn, nausea and vomiting.  Genitourinary: Negative for dysuria.  Musculoskeletal: Negative for myalgias.  Skin: Negative for rash.  Neurological: Positive for weakness. Negative for dizziness, tremors, speech change, focal weakness and headaches.    DRUG ALLERGIES:  No Known Allergies  VITALS:  Blood pressure 116/78, pulse 88, temperature 98.2 F (36.8 C), resp. rate 20, height 5' 10"  (1.778 m), weight 105 lb (47.6 kg), SpO2 99 %.  PHYSICAL EXAMINATION:   GENERAL:  70 y.o.-year-old Very thin patient lying in the bed. Malnutrition. EYES: Pupils equal, round, reactive to light and accommodation. No scleral icterus.  HEENT: Head atraumatic, normocephalic. Oropharynx and nasopharynx clear.  NECK:  Supple, no jugular venous distention. No thyroid enlargement, no tenderness.  LUNGS: Normal breath sounds bilaterally, no wheezing, no rales,rhonchi or  crepitations. No use of accessory muscles of respiration.  CARDIOVASCULAR: S1, S2 normal. No murmurs, rubs, or gallops.  ABDOMEN: Soft, nontender, nondistended. Bowel sounds present. No organomegaly or mass.  EXTREMITIES: No pedal edema, cyanosis, or clubbing.  NEUROLOGIC: Cranial nerves 2 through 12 are normal . Muscle strength 3/5 ,  able to move all extremities. Sensation intact. Gait not checked.  PSYCHIATRIC: The patient is alert and completely oriented today. SKIN: No obvious rash, lesion, or ulcer.  Physical Exam LABORATORY PANEL:   CBC  Recent Labs Lab 05/02/16 1003  WBC 9.4  HGB 10.4*  HCT 29.7*  PLT 61*   ------------------------------------------------------------------------------------------------------------------  Chemistries   Recent Labs Lab 04/30/16 0750  05/02/16 1003  NA 141  < > 131*  K 3.9  < > 4.2  CL 113*  < > 105  CO2 23  < > 21*  GLUCOSE 147*  < > 130*  BUN 23*  < > 20  CREATININE 0.55*  < > 0.52*  CALCIUM 7.5*  < > 7.6*  MG 1.9  < > 1.8  AST 63*  --   --   ALT 75*  --   --   ALKPHOS 164*  --   --   BILITOT 0.8  --   --   < > = values in this interval not displayed. ------------------------------------------------------------------------------------------------------------------  Cardiac Enzymes No results for input(s): TROPONINI in the last 168 hours. ------------------------------------------------------------------------------------------------------------------  RADIOLOGY:  Dg Chest 2 View  Result Date: 05/01/2016 CLINICAL DATA:  Pneumonia, wheezing EXAM: CHEST  2 VIEW COMPARISON:  04/29/2016 FINDINGS: Cardiomediastinal silhouette is stable. Persistent left base retrocardiac atelectasis or infiltrate. Mild hyperinflation.  No pulmonary edema. IMPRESSION: Persistent left base retrocardiac atelectasis or infiltrate. No pulmonary edema. Electronically Signed   By: Lahoma Crocker M.D.   On: 05/01/2016 14:27    ASSESSMENT AND PLAN:    Active Problems:   Multiple myeloma in relapse (HCC)   Pancytopenia (HCC)   Healthcare-associated pneumonia   Elevated transaminase level   SIRS (systemic inflammatory response syndrome) (HCC)   Protein-calorie malnutrition, severe  * Sepsis with HCAP   Likely Aspiration pneumonia       Appreciated ID consult, try to get sputum cx    Abx changed to Zosyn+ azithromycin. stoped azithromycin, continue zosyn.    Also ordered AFB culture. ID suggested to have bronch, viral and fungal tests, if he does not improve.    SLP eval done - now said- pt is able to swallow safe.  * metabolic encephalopathy   Due to healthcare associated pneumonia and malnutrition   Also under effect of pain medications.  improved.  * Metastatic multiple myeloma   Patient looks to have very poor prognosis.   Palliative care appreciated but family still wanted to give it a try if he can tolerate chemotherapy. Dr. Vicenta Aly suggested if patient has improvement in his nutrition then he may try chemotherapy agents which have given him promise in the results and he may have a few years survival with good quality of life.    * Severe malnutrition   Dietary consult, on TPN.   He may need to address feeding issue for long-term so called GI consult.   Appreciated GI consult, because patient has thrombocytopenia and active infection currently not a candidate for any procedure. Reevaluate in the future if he improves.   As per repeat SLP eval- he can swallow, advised to have small sips and small amount at a time.  encouraged oral intake.  * Pancytopenia   Oncology on the case, keep monitoring.   Had thrombocytopenia and anemia    On 04/29/16- transfused Plt and PRBC. Stable.  * Hyponatremia. Adjust TPN, encourage oral intake.  Weakness: HHPT.  All the records are reviewed and case discussed with Care Management/Social Workerr. Management plans discussed with the patient, his daughters, and they are in  agreement.  CODE STATUS: full  TOTAL TIME TAKING CARE OF THIS PATIENT: 36 minutes.   I had detailed discussion with the patient's daughter and son in the room and discussed the case with oncologist, palliative care nurse practitioner .  POSSIBLE D/C IN 2-3 DAYS, DEPENDING ON CLINICAL CONDITION.   Demetrios Loll M.D on 05/02/2016   Between 7am to 6pm - Pager - [phone]  After 6pm go to [email or web address] - password [email or web address] [facility]  Office  [phone]  CC: Primary care physician; Alfonse Flavors, MD  Note: This dictation was prepared with Dragon dictation along with smaller phrase technology. Any transcriptional errors that result from this process are unintentional.

## 2016-05-03 ENCOUNTER — Ambulatory Visit: Payer: Medicare Other

## 2016-05-03 DIAGNOSIS — R4182 Altered mental status, unspecified: Secondary | ICD-10-CM | POA: Diagnosis not present

## 2016-05-03 DIAGNOSIS — A419 Sepsis, unspecified organism: Secondary | ICD-10-CM | POA: Diagnosis not present

## 2016-05-03 LAB — MULTIPLE MYELOMA PANEL, SERUM
ALBUMIN SERPL ELPH-MCNC: 1.9 g/dL — AB (ref 2.9–4.4)
ALPHA 1: 0.5 g/dL — AB (ref 0.0–0.4)
Albumin/Glob SerPl: 0.7 (ref 0.7–1.7)
Alpha2 Glob SerPl Elph-Mcnc: 0.9 g/dL (ref 0.4–1.0)
B-Globulin SerPl Elph-Mcnc: 0.7 g/dL (ref 0.7–1.3)
Gamma Glob SerPl Elph-Mcnc: 1 g/dL (ref 0.4–1.8)
Globulin, Total: 3.1 g/dL (ref 2.2–3.9)
IGA: 11 mg/dL — AB (ref 61–437)
IGM, SERUM: 9 mg/dL — AB (ref 15–143)
IgG (Immunoglobin G), Serum: 1132 mg/dL (ref 700–1600)
M Protein SerPl Elph-Mcnc: 0.8 g/dL — ABNORMAL HIGH
Total Protein ELP: 5 g/dL — ABNORMAL LOW (ref 6.0–8.5)

## 2016-05-03 LAB — CBC WITH DIFFERENTIAL/PLATELET
BASOS ABS: 0 10*3/uL (ref 0–0.1)
Basophils Relative: 0 %
Eosinophils Absolute: 0 10*3/uL (ref 0–0.7)
Eosinophils Relative: 0 %
HCT: 28.1 % — ABNORMAL LOW (ref 40.0–52.0)
Hemoglobin: 9.8 g/dL — ABNORMAL LOW (ref 13.0–18.0)
LYMPHS ABS: 1.2 10*3/uL (ref 1.0–3.6)
Lymphocytes Relative: 9 %
MCH: 29.7 pg (ref 26.0–34.0)
MCHC: 34.8 g/dL (ref 32.0–36.0)
MCV: 85.3 fL (ref 80.0–100.0)
MONO ABS: 1 10*3/uL (ref 0.2–1.0)
Monocytes Relative: 8 %
NEUTROS PCT: 83 %
Neutro Abs: 10.6 10*3/uL — ABNORMAL HIGH (ref 1.4–6.5)
PLATELETS: 86 10*3/uL — AB (ref 150–440)
RBC: 3.3 MIL/uL — AB (ref 4.40–5.90)
RDW: 16.5 % — AB (ref 11.5–14.5)
WBC: 12.8 10*3/uL — AB (ref 3.8–10.6)

## 2016-05-03 LAB — HEPATITIS PANEL, ACUTE
HCV Ab: 0.2 s/co ratio (ref 0.0–0.9)
HEP B S AG: NEGATIVE
Hep A IgM: NEGATIVE
Hep B C IgM: NEGATIVE

## 2016-05-03 LAB — GLUCOSE, CAPILLARY
GLUCOSE-CAPILLARY: 132 mg/dL — AB (ref 65–99)
Glucose-Capillary: 140 mg/dL — ABNORMAL HIGH (ref 65–99)
Glucose-Capillary: 137 mg/dL — ABNORMAL HIGH (ref 65–99)
Glucose-Capillary: 121 mg/dL — ABNORMAL HIGH (ref 65–99)
Glucose-Capillary: 126 mg/dL — ABNORMAL HIGH (ref 65–99)
Glucose-Capillary: 134 mg/dL — ABNORMAL HIGH (ref 65–99)

## 2016-05-03 LAB — BASIC METABOLIC PANEL
ANION GAP: 6 (ref 5–15)
BUN: 24 mg/dL — ABNORMAL HIGH (ref 6–20)
CALCIUM: 7.4 mg/dL — AB (ref 8.9–10.3)
CO2: 21 mmol/L — ABNORMAL LOW (ref 22–32)
Chloride: 105 mmol/L (ref 101–111)
Creatinine, Ser: 0.59 mg/dL — ABNORMAL LOW (ref 0.61–1.24)
Glucose, Bld: 129 mg/dL — ABNORMAL HIGH (ref 65–99)
Potassium: 4.1 mmol/L (ref 3.5–5.1)
SODIUM: 132 mmol/L — AB (ref 135–145)

## 2016-05-03 LAB — MAGNESIUM: MAGNESIUM: 2.3 mg/dL (ref 1.7–2.4)

## 2016-05-03 LAB — PHOSPHORUS: PHOSPHORUS: 2.3 mg/dL — AB (ref 2.5–4.6)

## 2016-05-03 LAB — TRIGLYCERIDES: TRIGLYCERIDES: 270 mg/dL — AB (ref <150)

## 2016-05-03 MED ORDER — POTASSIUM & SODIUM PHOSPHATES 280-160-250 MG PO PACK
1.0000 | PACK | Freq: Three times a day (TID) | ORAL | Status: DC
  Filled 2016-05-03 (×2): qty 1

## 2016-05-03 MED ORDER — SODIUM PHOSPHATES 45 MMOLE/15ML IV SOLN
10.0000 mmol | Freq: Once | INTRAVENOUS | Status: AC
  Administered 2016-05-03: 08:00:00 10 mmol via INTRAVENOUS
  Filled 2016-05-03: qty 3.33

## 2016-05-03 MED ORDER — TRACE MINERALS CR-CU-MN-SE-ZN 10-1000-500-60 MCG/ML IV SOLN
INTRAVENOUS | Status: DC
  Administered 2016-05-03: 18:00:00 via INTRAVENOUS
  Filled 2016-05-03: qty 1320

## 2016-05-03 NOTE — Progress Notes (Addendum)
Pharmacy Antibiotic Note  Francisco [name] is a 70 y.o. male admitted on 04/25/2016 with sepsis.  Pharmacy has been consulted for piperacillin/tazobactam dosing for aspiration pneumonia.   Plan: Currently on day 7 of piperacillin/tazobactam 3.375g q 8 hr EI. Per ID, will plan on a total of 10-14 days of piperacillin/tazobactam therapy due to possible aspiration pneumonia. Will continue to follow with ID.   Temp (24hrs), Avg:97.9 F (36.6 C), Min:97.6 F (36.4 C), Max:98.1 F (36.7 C)   Recent Labs Lab 04/28/16 1003 04/29/16 0637 04/30/16 0750 05/01/16 0817 05/02/16 1003 05/03/16 0437  WBC  --  4.6 5.9 8.6 9.4 12.8*  CREATININE  --  0.70 0.55* 0.57* 0.52* 0.59*  VANCOTROUGH 7*  --   --   --   --   --     Estimated Creatinine Clearance: 57 mL/min (by C-G formula based on SCr of 0.59 mg/dL (L)).    No Known Allergies  Antimicrobials this admission: Vancomycin  10/16 >> 10/19 Cefepime  10/16 >> 10/17 Acyclovir 10/16 >> 10/16 Zosyn 10/17>> Azithromycin 10/17>> 10/20  Dose adjustments this admission:  Microbiology results: 10/16 BCx: no growth 10/17 MRSA PCR: negative  Thank you for allowing pharmacy to be a part of this patient's care.  Darrow Bussing, PharmD Pharmacy Resident 05/03/2016 2:20 PM

## 2016-05-03 NOTE — Consult Note (Signed)
PHARMACY - ADULT TOTAL PARENTERAL NUTRITION CONSULT NOTE   Pharmacy Consult for TPN Indication:Severe malnutrition in context of chronic illness  Patient Measurements: Height: 5\' 10"  (177.8 cm) Weight: 105 lb (47.6 kg) IBW/kg (Calculated) : 73 TPN AdjBW (KG): 42.7 Body mass index is 15.07 kg/m.   Assessment:   GI: not a candidate for PEG tube placement at this time Endo: Insulin requirements in the past 24 hours: 5 units (currently receiving solu-medrol 40 mg q 12) Renal: Pulm: possible aspiration PNA on day 6 of full piperacillin/tazobactam 3.375 g IV therapy Cards: Hepatobil: Neuro: ID: possible aspiration PNA on piperacillin/tazobactam  Best Practices: TPN Access:PICC TPN start date: started at previous admission this month, restarted 10/18  Nutritional Goals (per RD recommendation on 10/23): 1590 kcal, 66 grams protein, 1320 ml fluid with total calories from lipids averaged over 7 days   Current Nutrition:   TPN Plan:  10/24 Continue TPN of Clinimix 5/20 at goal rate of 77ml/hr. No lipids. 10/23 Pt received ILE 20% @ 50 ml/hour x 10 hours with TPN Clinimix 5/20 at goal rate of 47ml/hr. Continue to monitor K, phos, Mg closley as pt is at risk for refeeding.  All electrolytes in the AM Strict I/O, daily weights, CBG q 4hr checks with sensitive SSI ordered.   Electrolytes/TG: 10/22 23:30 PO4 2.7 No supplementation ordered. Recheck labs in AM.  10/23 1000 K = 4.2; Na = 131; corrected ca = 9.2; mag = 1.7; phos = 2.5 Pt received mag 2 g IV x 1 and kphos 500 mg PO x 1 Will recheck electrolytes with am labs  10/24 0430 K = 4.1; Na = 132; corrected ca = 9.1; mag = 2.3; phos = 2.3; TG = 270 Pt family requests electrolytes not be replaced PO. Will give sodium phos 10 mmol IV x 1 and recheck all electrolytes with am labs   Darrow Bussing, PharmD Pharmacy Resident 05/03/2016 2:17 PM

## 2016-05-03 NOTE — Consult Note (Signed)
PHARMACY - ADULT TOTAL PARENTERAL NUTRITION CONSULT NOTE   Pharmacy Consult for TPN Indication:Severe malnutrition in context of chronic illness  Patient Measurements: Height: 5\' 10"  (177.8 cm) Weight: 105 lb (47.6 kg) IBW/kg (Calculated) : 73 TPN AdjBW (KG): 42.7 Body mass index is 15.07 kg/m.   Assessment:   GI: not a candidate for PEG tube placement at this time Endo: Insulin requirements in the past 24 hours: 5 units (currently receiving solu-medrol 40 mg q 12) Renal: Pulm: possible aspiration PNA on day 6 of full piperacillin/tazobactam 3.375 g IV therapy Cards: Hepatobil: Neuro: ID: possible aspiration PNA on piperacillin/tazobactam  Best Practices: TPN Access:PICC TPN start date: started at previous admission this month, restarted 10/18  Nutritional Goals (per RD recommendation on 10/23): 1590 kcal, 66 grams protein, 1320 ml fluid with total calories from lipids averaged over 7 days   Current Nutrition:   TPN Plan:  Continue TPN of Clinimix 5/20 at goal rate of 45ml/hr, initiating ILE 20% @ 50 ml/hour x 10 hours to start with TPN at 1800. Continue to monitor K, phos, Mg closley as pt is at risk for refeeding. Per RD, will check TG 10/24 am All electrolytes in the AM Strict I/O, daily weights, CBG q 4hr checks with sensitive SSI ordered.   Electrolytes: 10/22 23:30 PO4 2.7 No supplementation ordered. Recheck labs in AM.  10/23 1000 K = 4.2; Na = 131; corrected ca = 9.2; mag = 1.7; phos = 2.5 Pt received mag 2 g IV x 1 and kphos 500 mg PO x 1 Will recheck electrolytes with am labs  10/24 0437 Na 132, K 4.1, Ca 7.4, corrected Ca 9.1, phos 2.3, mg 2.3. Start phos-nak 1 packet po TID AC x 3 doses. Will recheck all electrolytes with AM labs.  Kavontae Pritchard A. [place], [place].D., BCPS Clinical Pharmacist 05/03/2016 5:39 AM

## 2016-05-03 NOTE — Progress Notes (Signed)
Francisco [name] INFECTIOUS DISEASE PROGRESS NOTE Date of Admission:  04/25/2016     ID: Francisco [name] is a 70 y.o. male with probable aspiration PNA, hx treated TB, Multiple myeloma and FTT Active Problems:   Multiple myeloma in relapse (HCC)   Pancytopenia (HCC)   Healthcare-associated pneumonia   Elevated transaminase level   SIRS (systemic inflammatory response syndrome) (HCC)   Protein-calorie malnutrition, severe   Subjective: No fevers, a little stronger.   ROS  Eleven systems are reviewed and negative except per hpi  Medications:  Antibiotics Given (last 72 hours)    Date/Time Action Medication Dose Rate   04/30/16 1509 Given   piperacillin-tazobactam (ZOSYN) IVPB 3.375 g 3.375 g 12.5 mL/hr   04/30/16 2155 Given   piperacillin-tazobactam (ZOSYN) IVPB 3.375 g 3.375 g 12.5 mL/hr   05/01/16 1626 Given   piperacillin-tazobactam (ZOSYN) IVPB 3.375 g 3.375 g 12.5 mL/hr   05/01/16 2128 Given   piperacillin-tazobactam (ZOSYN) IVPB 3.375 g 3.375 g 12.5 mL/hr   05/02/16 0506 Given   piperacillin-tazobactam (ZOSYN) IVPB 3.375 g 3.375 g 12.5 mL/hr   05/02/16 1400 Given   piperacillin-tazobactam (ZOSYN) IVPB 3.375 g 3.375 g 12.5 mL/hr   05/02/16 2234 Given   piperacillin-tazobactam (ZOSYN) IVPB 3.375 g 3.375 g 12.5 mL/hr   05/03/16 0620 Given   piperacillin-tazobactam (ZOSYN) IVPB 3.375 g 3.375 g 12.5 mL/hr   05/03/16 1408 Given   piperacillin-tazobactam (ZOSYN) IVPB 3.375 g 3.375 g 12.5 mL/hr     . famotidine (PEPCID) IV  20 mg Intravenous Q12H  . feeding supplement  1 Container Oral TID BM  . feeding supplement (ENSURE ENLIVE)  237 mL Oral BID BM  . insulin aspart  0-9 Units Subcutaneous Q4H  . mouth rinse  15 mL Mouth Rinse BID  . methylPREDNISolone (SOLU-MEDROL) injection  40 mg Intravenous Q12H  . piperacillin-tazobactam (ZOSYN)  IV  3.375 g Intravenous Q8H  . senna-docusate  1 tablet Oral BID  . sodium chloride flush  3 mL Intravenous Q12H  . tiotropium  18  mcg Inhalation Daily    Objective: Vital signs in last 24 hours: Temp:  [97.6 F (36.4 C)-98.1 F (36.7 C)] 98 F (36.7 C) (10/24 1349) Pulse Rate:  [82-95] 95 (10/24 1349) Resp:  [16-20] 20 (10/24 0515) BP: (103-117)/(66-71) 103/66 (10/24 1349) SpO2:  [97 %-98 %] 97 % (10/24 1349) Weight:  [47.6 kg (105 lb)] 47.6 kg (105 lb) (10/24 0629) Constitutional: ill appearing, frail, cachectic HENT: anicteric, pale Mouth/Throat: Oropharynx is clear and dry . No oropharyngeal exudate.  Cardiovascular: Normal rate, regular rhythm and normal heart sounds. Exam reveals no gallop and no friction rub.  No murmur heard.  Pulmonary/Chest: bil rhonchi, poor resp effort Abdominal: Soft. Bowel sounds are normal. He exhibits no distension. There is no tenderness.  Lymphadenopathy: He has no cervical adenopathy.  Neurological: lethargic Skin: Skin is warm and dry. No rash noted. No erythema.  Psychiatric:lethargic  Lab Results  Recent Labs  05/02/16 1003 05/03/16 0437  WBC 9.4 12.8*  HGB 10.4* 9.8*  HCT 29.7* 28.1*  NA 131* 132*  K 4.2 4.1  CL 105 105  CO2 21* 21*  BUN 20 24*  CREATININE 0.52* 0.59*    Microbiology: Results for orders placed or performed during the hospital encounter of 04/25/16  Blood culture (routine x 2)     Status: None   Collection Time: 04/25/16  4:32 PM  Result Value Ref Range Status   Specimen Description BLOOD  Final   Special Requests  NONE  Final   Culture NO GROWTH 5 DAYS  Final   Report Status 04/30/2016 FINAL  Final  Blood culture (routine x 2)     Status: None   Collection Time: 04/25/16  4:32 PM  Result Value Ref Range Status   Specimen Description BLOOD  Final   Special Requests NONE  Final   Culture NO GROWTH 5 DAYS  Final   Report Status 04/30/2016 FINAL  Final  MRSA PCR Screening     Status: None   Collection Time: 04/26/16 11:35 AM  Result Value Ref Range Status   MRSA by PCR NEGATIVE NEGATIVE Final    Comment:        The GeneXpert MRSA  Assay (FDA approved for NASAL specimens only), is one component of a comprehensive MRSA colonization surveillance program. It is not intended to diagnose MRSA infection nor to guide or monitor treatment for MRSA infections.   Culture, expectorated sputum-assessment     Status: None   Collection Time: 04/29/16  9:41 AM  Result Value Ref Range Status   Specimen Description EXPECTORATED SPUTUM  Final   Special Requests Immunocompromised  Final   Sputum evaluation   Final    Sputum specimen not acceptable for testing.  Please recollect.   CALLED TO DIEDRE MALCOLM AT 1173 ON 04/29/16.Marland KitchenMarland KitchenCentral Maryland Endoscopy LLC    Report Status 04/29/2016 FINAL  Final    Studies/Results: No results found.  Assessment/Plan: Francisco [name] is a 70 y.o. male with new dx of myeloma with diffuse bony lesions, on chemo and XRT admitted with progressive weakness, sob, pancytopenia. He has hx TB in 1985 reportedly treated in 1985 in [place].  His CT done a month ago shows chronic scarring or upper lobes, but CXR now shows some progressive airspace disease.  Interestingly he also has some hoarseness.  CT shows findings of likely repeat aspiration and he has air and fluid distention of his esophagus.  I think TB is unlikely but possible in this immunocompromised patient. He has been unable to produce sputum .  He is at risk of other pathogens including PCP (But CT pattern atypical and not hypoxic) and fungal WBC up to 4.6  Recommendations Since unable to produce induced sputum  can dc airborne  isolation Cont zosyn - will plan on a 14 day course of abx Fungal serology is pending since unable to produce sputum for culture If worsens from fevers or pulm decline would suggest a bronch (if pulm feels he can tolerate) with virus culture, PCP, fungal and AFB culture as well as routine cx Thank you very much for the consult. Will follow with you.  [place], Collin Rengel P   05/03/2016, 2:23 PM

## 2016-05-03 NOTE — Plan of Care (Addendum)
Problem: Nutrition: Goal: Adequate nutrition will be maintained Outcome: Not Progressing TPN continues.  Regular diet but poor PO intake.  Vanilla Ensure and [employer] provided at patient's request but minimal intake of either today.   Comments: Patient is very fatigued, sleeping most of the shift. No c/o pain or nausea. Daughter at bedside throughout the shift updated frequently. Refuses Q2H turning, prefers lying supine.

## 2016-05-03 NOTE — Progress Notes (Signed)
[facility] [place] NAME: Francisco [name]    MR#:  [id number]  DATE OF BIRTH:  03-13-1946  SUBJECTIVE:  CHIEF COMPLAINT:   Chief Complaint  Patient presents with  . Altered Mental Status     Diagnosed with metastatic multiple myeloma a few months ago and have decreased oral intake with nausea and severe pain and have severe malnutrition. He was recently started on TPN 2 weeks ago, but had worsening in her overall condition and brought back with altered mental status. Found to have pneumonia.   weakness, a little better oral intake per his daughter.  REVIEW OF SYSTEMS:    Review of Systems  Constitutional: Positive for malaise/fatigue. Negative for fever and weight loss.       Poor oral intake.  HENT: Negative for hearing loss and sore throat.   Eyes: Negative for blurred vision and pain.  Respiratory: Negative for cough, sputum production and shortness of breath.   Cardiovascular: Negative for chest pain, palpitations and leg swelling.  Gastrointestinal: Negative for abdominal pain, diarrhea, heartburn, nausea and vomiting.  Genitourinary: Negative for dysuria.  Musculoskeletal: Negative for myalgias.  Skin: Negative for rash.  Neurological: Positive for weakness. Negative for dizziness, tremors, speech change, focal weakness and headaches.    DRUG ALLERGIES:  No Known Allergies  VITALS:  Blood pressure 103/66, pulse 95, temperature 98 F (36.7 C), temperature source Oral, resp. rate 20, height _0  (1.778 m), weight 105 lb (47.6 kg), SpO2 97 %.  PHYSICAL EXAMINATION:   GENERAL:  70 y.o.-year-old Very thin patient lying in the bed. Malnutrition. EYES: Pupils equal, round, reactive to light and accommodation. No scleral icterus.  HEENT: Head atraumatic, normocephalic. Oropharynx and nasopharynx clear.  NECK:  Supple, no jugular venous distention. No thyroid enlargement, no tenderness.  LUNGS: Normal breath sounds  bilaterally, no wheezing, no rales,rhonchi or crepitations. No use of accessory muscles of respiration.  CARDIOVASCULAR: S1, S2 normal. No murmurs, rubs, or gallops.  ABDOMEN: Soft, nontender, nondistended. Bowel sounds present. No organomegaly or mass.  EXTREMITIES: No pedal edema, cyanosis, or clubbing.  NEUROLOGIC: Cranial nerves 2 through 12 are normal . Muscle strength 3/5 ,  able to move all extremities. Sensation intact. Gait not checked.  PSYCHIATRIC: The patient is alert and completely oriented today. SKIN: No obvious rash, lesion, or ulcer.  Physical Exam LABORATORY PANEL:   CBC  Recent Labs Lab 05/03/16 0437  WBC 12.8*  HGB 9.8*  HCT 28.1*  PLT 86*   ------------------------------------------------------------------------------------------------------------------  Chemistries   Recent Labs Lab 04/30/16 0750  05/03/16 0437  NA 141  < > 132*  K 3.9  < > 4.1  CL 113*  < > 105  CO2 23  < > 21*  GLUCOSE 147*  < > 129*  BUN 23*  < > 24*  CREATININE 0.55*  < > 0.59*  CALCIUM 7.5*  < > 7.4*  MG 1.9  < > 2.3  AST 63*  --   --   ALT 75*  --   --   ALKPHOS 164*  --   --   BILITOT 0.8  --   --   < > = values in this interval not displayed. ------------------------------------------------------------------------------------------------------------------  Cardiac Enzymes No results for input(s): TROPONINI in the last 168 hours. ------------------------------------------------------------------------------------------------------------------  RADIOLOGY:  Dg Chest 2 View  Result Date: 05/01/2016 CLINICAL DATA:  Pneumonia, wheezing EXAM: CHEST  2 VIEW COMPARISON:  04/29/2016 FINDINGS: Cardiomediastinal silhouette is stable. Persistent left  base retrocardiac atelectasis or infiltrate. Mild hyperinflation. No pulmonary edema. IMPRESSION: Persistent left base retrocardiac atelectasis or infiltrate. No pulmonary edema. Electronically Signed   By: Lahoma Crocker M.D.   On:  05/01/2016 14:27    ASSESSMENT AND PLAN:   Active Problems:   Multiple myeloma in relapse (HCC)   Pancytopenia (HCC)   Healthcare-associated pneumonia   Elevated transaminase level   SIRS (systemic inflammatory response syndrome) (HCC)   Protein-calorie malnutrition, severe  * Sepsis with HCAP   Likely Aspiration pneumonia       Appreciated ID consult, try to get sputum cx    Abx changed to Zosyn+ azithromycin. stoped azithromycin, continue zosyn.    Also ordered AFB culture. ID suggested to have bronch, viral and fungal tests, if he does not improve.    SLP eval done - now said- pt is able to swallow safe.  may change to po augmentin, f/u Dr. Ola Spurr.  * metabolic encephalopathy   Due to healthcare associated pneumonia and malnutrition   Also under effect of pain medications.  improved.  * Metastatic multiple myeloma   Patient looks to have very poor prognosis.   Palliative care appreciated but family still wanted to give it a try if he can tolerate chemotherapy. Dr. Vicenta Aly suggested if patient has improvement in his nutrition then he may try chemotherapy agents which have given him promise in the results and he may have a few years survival with good quality of life.    * Severe malnutrition   Dietary consult, on TPN.   He may need to address feeding issue for long-term so called GI consult.   Appreciated GI consult, because patient has thrombocytopenia and active infection currently not a candidate for any procedure. Reevaluate in the future if he improves.   As per repeat SLP eval- he can swallow, advised to have small sips and small amount at a time.  encouraged oral intake.  * Pancytopenia   Oncology on the case, keep monitoring.   Had thrombocytopenia and anemia    On 04/29/16- transfused Plt and PRBC. Stable.  * Hyponatremia. Adjust TPN, encourage oral intake.  Weakness: HHPT. But that the patient could not get home PT due to insurance, he only has Medicaid  per CM.  All the records are reviewed and case discussed with Care Management/Social Workerr. Management plans discussed with the patient, his daughters, and they are in agreement.  CODE STATUS: full  TOTAL TIME TAKING CARE OF THIS PATIENT: 33 minutes.   I had detailed discussion with the patient's daughter and son in the room and discussed the case with oncologist, palliative care nurse practitioner .  POSSIBLE D/C IN 1-2 DAYS, DEPENDING ON CLINICAL CONDITION.   Demetrios Loll M.D on 05/03/2016   Between 7am to 6pm - Pager - [phone]  After 6pm go to [email or web address] - password [email or web address] [facility]  Office  [phone]  CC: Primary care physician; Alfonse Flavors, MD  Note: This dictation was prepared with Dragon dictation along with smaller phrase technology. Any transcriptional errors that result from this process are unintentional.

## 2016-05-03 NOTE — Progress Notes (Signed)
Physical Therapy Treatment Patient Details Name: Francisco [name] MRN: [id number] DOB: [birth year] Today's Date: 05/03/2016    History of Present Illness Pt admitted for complaints of AMS, however appears to be at baseline orientation at this time. Pt with history of multiple myeloma, currently on radiation therapy. History of TB.    PT Comments    Pt sleeping upon arrival; attempted to awaken pt post family permission. Pt awakens briefly to refuse treatment noting he is too tired. Pt states he has been like this throughout the day and does not want to disturb hime further. Family requests therapist educate family on exercises, so that they may perform with him when is is willing and able. Family offered written description of exercises as well, but does not wish at this time. Continue PT as appropriate and pt able to tolerate to improve strength and endurance to improve all functional mobility.   Follow Up Recommendations  Home health PT;Supervision/Assistance - 24 hour     Equipment Recommendations  Rolling walker with 5" wheels    Recommendations for Other Services       Precautions / Restrictions Precautions Precautions: Fall Restrictions Weight Bearing Restrictions: No    Mobility  Bed Mobility                  Transfers                    Ambulation/Gait                 Stairs            Wheelchair Mobility    Modified Rankin (Stroke Patients Only)       Balance                                    Cognition Arousal/Alertness: Lethargic (sleeping; wakes 1x briefly to refuse PT)                          Exercises Other Exercises Other Exercises: Family requests education on exercises for pt to do, so that they may perform with him when he is awake/more alert . Education provided for supine exercises x 10 min to several family memebers.     General Comments        Pertinent Vitals/Pain      Home  Living                      Prior Function            PT Goals (current goals can now be found in the care plan section) Progress towards PT goals: Not progressing toward goals - comment    Frequency    Min 2X/week      PT Plan Current plan remains appropriate    Co-evaluation             End of Session     Patient left: with call bell/phone within reach;in bed;with bed alarm set;with family/visitor present     Time: 3202-3343 PT Time Calculation (min) (ACUTE ONLY): 11 min  Charges:  $Therapeutic Exercise: 8-22 mins                    G CodesLarae Grooms, PTA 05/03/2016, 2:54 PM

## 2016-05-04 ENCOUNTER — Ambulatory Visit: Payer: Medicare Other

## 2016-05-04 DIAGNOSIS — A419 Sepsis, unspecified organism: Secondary | ICD-10-CM | POA: Diagnosis not present

## 2016-05-04 DIAGNOSIS — L8990 Pressure ulcer of unspecified site, unspecified stage: Secondary | ICD-10-CM | POA: Insufficient documentation

## 2016-05-04 DIAGNOSIS — R4182 Altered mental status, unspecified: Secondary | ICD-10-CM | POA: Diagnosis not present

## 2016-05-04 LAB — PHOSPHORUS: PHOSPHORUS: 2.5 mg/dL (ref 2.5–4.6)

## 2016-05-04 LAB — CBC WITH DIFFERENTIAL/PLATELET
BASOS PCT: 0 %
BLASTS: 0 %
Band Neutrophils: 2 %
Basophils Absolute: 0 10*3/uL (ref 0–0.1)
EOS ABS: 0 10*3/uL (ref 0–0.7)
Eosinophils Relative: 0 %
HEMATOCRIT: 27.4 % — AB (ref 40.0–52.0)
HEMOGLOBIN: 9.4 g/dL — AB (ref 13.0–18.0)
LYMPHS PCT: 9 %
Lymphs Abs: 1.1 10*3/uL (ref 1.0–3.6)
MCH: 28.8 pg (ref 26.0–34.0)
MCHC: 34.4 g/dL (ref 32.0–36.0)
MCV: 83.7 fL (ref 80.0–100.0)
MONO ABS: 0.4 10*3/uL (ref 0.2–1.0)
MYELOCYTES: 0 %
Metamyelocytes Relative: 1 %
Monocytes Relative: 3 %
NEUTROS PCT: 85 %
NRBC: 0 /100{WBCs}
Neutro Abs: 10.4 10*3/uL — ABNORMAL HIGH (ref 1.4–6.5)
OTHER: 0 %
PROMYELOCYTES ABS: 0 %
Platelets: 113 10*3/uL — ABNORMAL LOW (ref 150–440)
RBC: 3.27 MIL/uL — ABNORMAL LOW (ref 4.40–5.90)
RDW: 17.1 % — ABNORMAL HIGH (ref 11.5–14.5)
WBC: 11.9 10*3/uL — ABNORMAL HIGH (ref 3.8–10.6)

## 2016-05-04 LAB — BASIC METABOLIC PANEL
Anion gap: 5 (ref 5–15)
BUN: 28 mg/dL — ABNORMAL HIGH (ref 6–20)
CHLORIDE: 105 mmol/L (ref 101–111)
CO2: 22 mmol/L (ref 22–32)
Calcium: 7.4 mg/dL — ABNORMAL LOW (ref 8.9–10.3)
Creatinine, Ser: 0.61 mg/dL (ref 0.61–1.24)
GFR calc non Af Amer: >60 mL/min (ref >60)
Glucose, Bld: 132 mg/dL — ABNORMAL HIGH (ref 65–99)
POTASSIUM: 4.3 mmol/L (ref 3.5–5.1)
SODIUM: 132 mmol/L — AB (ref 135–145)

## 2016-05-04 LAB — GLUCOSE, CAPILLARY
GLUCOSE-CAPILLARY: 134 mg/dL — AB (ref 65–99)
GLUCOSE-CAPILLARY: 134 mg/dL — AB (ref 65–99)
GLUCOSE-CAPILLARY: 148 mg/dL — AB (ref 65–99)

## 2016-05-04 LAB — MAGNESIUM: MAGNESIUM: 2.1 mg/dL (ref 1.7–2.4)

## 2016-05-04 MED ORDER — PREMIER PROTEIN SHAKE: 11.0000 [oz_av] | ORAL | Status: DC

## 2016-05-04 MED ORDER — TIOTROPIUM BROMIDE MONOHYDRATE 18 MCG IN CAPS: 18.0000 ug | ORAL_CAPSULE | Freq: Every day | RESPIRATORY_TRACT | 2 refills | Status: DC

## 2016-05-04 MED ORDER — PANTOPRAZOLE SODIUM 40 MG PO TBEC: 40.0000 mg | DELAYED_RELEASE_TABLET | Freq: Every day | ORAL | Status: DC

## 2016-05-04 MED ORDER — PREMIER PROTEIN SHAKE: 11.0000 [oz_av] | ORAL | 14 refills | Status: DC

## 2016-05-04 MED ORDER — ALBUTEROL SULFATE HFA 108 (90 BASE) MCG/ACT IN AERS: 2.0000 | INHALATION_SPRAY | Freq: Four times a day (QID) | RESPIRATORY_TRACT | 2 refills | Status: DC | PRN

## 2016-05-04 MED ORDER — PIPERACILLIN-TAZOBACTAM 3.375 G IVPB: 3.3750 g | Freq: Three times a day (TID) | INTRAVENOUS | 0 refills | Status: DC

## 2016-05-04 NOTE — Progress Notes (Signed)
[place] INFECTIOUS DISEASE PROGRESS NOTE Date of Admission:  04/25/2016     ID: Francisco [name] is a 70 y.o. male with probable aspiration PNA, hx treated TB, Multiple myeloma and FTT Active Problems:   Multiple myeloma in relapse (HCC)   Pancytopenia (HCC)   Healthcare-associated pneumonia   Elevated transaminase level   SIRS (systemic inflammatory response syndrome) (HCC)   Protein-calorie malnutrition, severe   Pressure injury of skin   Subjective: No fevers, sats remain good on RA, sitting up in bed and eating some  ROS  Eleven systems are reviewed and negative except per hpi  Medications:  Antibiotics Given (last 72 hours)    Date/Time Action Medication Dose Rate   05/01/16 1626 Given   piperacillin-tazobactam (ZOSYN) IVPB 3.375 g 3.375 g 12.5 mL/hr   05/01/16 2128 Given   piperacillin-tazobactam (ZOSYN) IVPB 3.375 g 3.375 g 12.5 mL/hr   05/02/16 0506 Given   piperacillin-tazobactam (ZOSYN) IVPB 3.375 g 3.375 g 12.5 mL/hr   05/02/16 1400 Given   piperacillin-tazobactam (ZOSYN) IVPB 3.375 g 3.375 g 12.5 mL/hr   05/02/16 2234 Given   piperacillin-tazobactam (ZOSYN) IVPB 3.375 g 3.375 g 12.5 mL/hr   05/03/16 1610 Given   piperacillin-tazobactam (ZOSYN) IVPB 3.375 g 3.375 g 12.5 mL/hr   05/03/16 1408 Given   piperacillin-tazobactam (ZOSYN) IVPB 3.375 g 3.375 g 12.5 mL/hr   05/03/16 2252 Given   piperacillin-tazobactam (ZOSYN) IVPB 3.375 g 3.375 g 12.5 mL/hr   05/04/16 0658 Given   piperacillin-tazobactam (ZOSYN) IVPB 3.375 g 3.375 g 12.5 mL/hr   05/04/16 1234 Given  [may give early per pharmacy to accomodate discharge]   piperacillin-tazobactam (ZOSYN) IVPB 3.375 g 3.375 g 12.5 mL/hr     . feeding supplement  1 Container Oral TID BM  . insulin aspart  0-9 Units Subcutaneous Q4H  . mouth rinse  15 mL Mouth Rinse BID  . pantoprazole  40 mg Oral Daily  . piperacillin-tazobactam (ZOSYN)  IV  3.375 g Intravenous Q8H  . protein supplement shake  11 oz Oral  Q24H  . senna-docusate  1 tablet Oral BID  . sodium chloride flush  3 mL Intravenous Q12H  . tiotropium  18 mcg Inhalation Daily    Objective: Vital signs in last 24 hours: Temp:  [97.6 F (36.4 C)-98 F (36.7 C)] 98 F (36.7 C) (10/25 0409) Pulse Rate:  [91-99] 99 (10/25 0409) Resp:  [28] 28 (10/25 0409) BP: (103-109)/(56-75) 104/56 (10/25 0409) SpO2:  [97 %-99 %] 99 % (10/25 0409) Weight:  [35.9 kg (79 lb 3.2 oz)] 35.9 kg (79 lb 3.2 oz) (10/25 0500) Constitutional: ill appearing, frail, cachectic HENT: anicteric, pale Mouth/Throat: Oropharynx is clear and dry . No oropharyngeal exudate.  Cardiovascular: Normal rate, regular rhythm and normal heart sounds. Exam reveals no gallop and no friction rub.  No murmur heard.  Pulmonary/Chest: bil rhonchi, poor resp effort Abdominal: Soft. Bowel sounds are normal. He exhibits no distension. There is no tenderness.  Lymphadenopathy: He has no cervical adenopathy.  Neurological: lethargic Skin: Skin is warm and dry. No rash noted. No erythema.  Psychiatric:lethargic  Lab Results  Recent Labs  05/03/16 0437 05/04/16 0450  WBC 12.8* 11.9*  HGB 9.8* 9.4*  HCT 28.1* 27.4*  NA 132* 132*  K 4.1 4.3  CL 105 105  CO2 21* 22  BUN 24* 28*  CREATININE 0.59* 0.61    Microbiology: Results for orders placed or performed during the hospital encounter of 04/25/16  Blood culture (routine x 2)  Status: None   Collection Time: 04/25/16  4:32 PM  Result Value Ref Range Status   Specimen Description BLOOD  Final   Special Requests NONE  Final   Culture NO GROWTH 5 DAYS  Final   Report Status 04/30/2016 FINAL  Final  Blood culture (routine x 2)     Status: None   Collection Time: 04/25/16  4:32 PM  Result Value Ref Range Status   Specimen Description BLOOD  Final   Special Requests NONE  Final   Culture NO GROWTH 5 DAYS  Final   Report Status 04/30/2016 FINAL  Final  MRSA PCR Screening     Status: None   Collection Time: 04/26/16  11:35 AM  Result Value Ref Range Status   MRSA by PCR NEGATIVE NEGATIVE Final    Comment:        The GeneXpert MRSA Assay (FDA approved for NASAL specimens only), is one component of a comprehensive MRSA colonization surveillance program. It is not intended to diagnose MRSA infection nor to guide or monitor treatment for MRSA infections.   Culture, expectorated sputum-assessment     Status: None   Collection Time: 04/29/16  9:41 AM  Result Value Ref Range Status   Specimen Description EXPECTORATED SPUTUM  Final   Special Requests Immunocompromised  Final   Sputum evaluation   Final    Sputum specimen not acceptable for testing.  Please recollect.   CALLED TO Francisco [name] AT [phone] ON 04/29/16.Marland KitchenMarland KitchenMadison Regional Health System    Report Status 04/29/2016 FINAL  Final    Studies/Results: No results found.  Assessment/Plan: Francisco [name] is a 70 y.o. male with new dx of myeloma with diffuse bony lesions, on chemo and XRT admitted with progressive weakness, sob, pancytopenia. He has hx TB in 1985 reportedly treated in 1985 in [place].  His CT done a month ago shows chronic scarring or upper lobes, but CXR now shows some progressive airspace disease.  Interestingly he also has some hoarseness.  CT shows findings of likely repeat aspiration and he has air and fluid distention of his esophagus.  I think TB is unlikely but possible in this immunocompromised patient. He has been unable to produce sputum .  He is at risk of other pathogens including PCP (But CT pattern atypical and not hypoxic) and fungal  Recommendations Cont zosyn - will plan on a 14 day course of abx for HCAP  Fungal serology is pending since unable to produce sputum for culture If worsens from fevers or pulm decline would suggest a bronch (if pulm feels he can tolerate) with virus culture, PCP, fungal and AFB culture as well as routine cx Ok for DC and I can see at end of therapy  Thank you very much for the consult. Will follow with  you.  [place], Nilan Iddings P   05/04/2016, 1:22 PM

## 2016-05-04 NOTE — Progress Notes (Signed)
[facility] called for 1700 transport. Madlyn Frankel, RN

## 2016-05-04 NOTE — Plan of Care (Signed)
Problem: Education: Goal: Knowledge of [facility] Education information/materials will improve Outcome: Progressing Pt asleep majority of shift.  Refuses q2h turns, but states he will reposition himself with reminders to turn after RN noted small wound after pt had BM and discussed with pt and daughter.  TPN continued through PICC, PIV flushes well, no issues.  No complaints overnight.  Daughter at bedside, call bell within reach.  WCTM.  Problem: Skin Integrity: Goal: Risk for impaired skin integrity will decrease Outcome: Not Progressing Refused q2h turns multiple times.  RN noted wound on lower back after pt had BM.  Pt continues to refuse pillow support but states he will reposition himself with reminders to turn.  Problem: Nutrition: Goal: Adequate nutrition will be maintained Outcome: Not Progressing IV TPN, minimal PO intake.  Refused scheduled Boost supplement.

## 2016-05-04 NOTE — Consult Note (Addendum)
PHARMACY - ADULT TOTAL PARENTERAL NUTRITION CONSULT NOTE   Pharmacy Consult for TPN Indication:Severe malnutrition in context of chronic illness   Patient Measurements: Height: 5\' 10"  (177.8 cm) Weight: 79 lb 3.2 oz (35.9 kg) IBW/kg (Calculated) : 73 TPN AdjBW (KG): 42.7 Body mass index is 11.36 kg/m.   Assessment:   GI: not a candidate for PEG tube placement at this time Endo: Insulin requirements in the past 24 hours: 7 units *but did not receive any(currently receiving solu-medrol 40 mg q 12) *patient/family are refusing insulin *- see If SoluMedrol dose to be changed. Renal: Pulm: possible aspiration PNA on day 6 of full piperacillin/tazobactam 3.375 g IV therapy Cards: Hepatobil: Neuro: ID: possible aspiration PNA on piperacillin/tazobactam  Best Practices: TPN Access:PICC TPN start date: started at previous admission this month, restarted 10/18  Nutritional Goals (per RD recommendation on 10/23): 1590 kcal, 66 grams protein, 1320 ml fluid with total calories from lipids averaged over 7 days   Current Nutrition: reg diet ordered with Boost AND Ensure between meals, TPN.   TPN Plan:  10/24 Continue TPN of Clinimix 5/20 at goal rate of 32ml/hr. No lipids. 10/23 Pt received ILE 20% @ 50 ml/hour x 10 hours with TPN Clinimix 5/20 at goal rate of 64ml/hr. Continue to monitor K, phos, Mg closley as pt is at risk for refeeding.  All electrolytes in the AM Strict I/O, daily weights, CBG q 4hr checks with sensitive SSI ordered.   10/25: Pt to d/c home today with home TPN as ordered and lipids 20% MWF   Electrolytes/TG: 10/22 23:30 PO4 2.7 No supplementation ordered. Recheck labs in AM.  10/23 1000 K = 4.2; Na = 131; corrected ca = 9.2; mag = 1.7; phos = 2.5 Pt received mag 2 g IV x 1 and kphos 500 mg PO x 1 Will recheck electrolytes with am labs  10/24 0430 K = 4.1; Na = 132; corrected ca = 9.1; mag = 2.3; phos = 2.3; TG = 270 Pt family requests electrolytes not be  replaced PO. Will give sodium phos 10 mmol IV x 1 and recheck all electrolytes with am labs  10/25: K=4.3, Mag=2.1, Phos=2.5, Na=132,  Ca=7.4, Albumin (from 10/21)=1.9, Alb.corrected Ca=9.08. Labs WNL. F/u w/ am labs.    Chinita Greenland PharmD Clinical Pharmacist 05/04/2016

## 2016-05-04 NOTE — Care Management (Signed)
Discharge to home today per Dr, Bridgett Larsson. Spoke with daughter at the bedside. Would like [facility] to transport.  [facility] will be following TPN and IV Zosyn in the home. The 8:00pm Zosyn dose may be omitted tonight.  Spoke with Dr. Grayland Ormond. Francisco [name] is stable for discharge today, Will follow-up with telephone call to the home tomorrow, Daughter updated on all plans Shelbie Ammons RN MSN [facility] [phone]

## 2016-05-04 NOTE — Progress Notes (Signed)
Found a 1cm x 1cm open sore on mid lower back while assisting with a bed change.  Documented and suggested a wound consult to care nurse.  Spoke with daughter of patient and she states he only wants to lay on his back, but I told her that it is necessary for him to rotate sides to avoid any further damage while it heals.  Daughter is most agreeable and will ensure pt turns often.

## 2016-05-04 NOTE — Progress Notes (Signed)
Physical Therapy Treatment Patient Details Name: Francisco [name] MRN: [id number] DOB: 1946/05/16 Today's Date: 05/04/2016    History of Present Illness Pt admitted for complaints of AMS, however appears to be at baseline orientation at this time. Pt with history of multiple myeloma, currently on radiation therapy. History of TB.    PT Comments    Pt is making gradual progress towards goals with improved mobility this date. Pt able to stand and ambulate to recliner with HHA. Slow gait pattern noted and pt fatigues quickly. Once seated in recliner, family in room to assist feeding breakfast. Chair alarm pad placed in chair, however no box unit in room. RN notified. Family educated not to get pt back to bed without assistance.  Follow Up Recommendations  Home health PT;Supervision/Assistance - 24 hour     Equipment Recommendations  Rolling walker with 5" wheels    Recommendations for Other Services       Precautions / Restrictions Precautions Precautions: Fall Restrictions Weight Bearing Restrictions: No    Mobility  Bed Mobility Overal bed mobility: Needs Assistance Bed Mobility: Supine to Sit     Supine to sit: Min assist     General bed mobility comments: assist for attempted bed mobility and scooting out towards EOB. Once seated, pt able to sit with supervision  Transfers Overall transfer level: Needs assistance Equipment used: 1 person hand held assist Transfers: Sit to/from Stand Sit to Stand: Min assist         General transfer comment: HHA given at this time. Pt able to stand with slight B knee flexion.  Ambulation/Gait Ambulation/Gait assistance: Min guard Ambulation Distance (Feet): 2 Feet Assistive device: 1 person hand held assist Gait Pattern/deviations: Step-to pattern     General Gait Details: short step to gait pattern with slow gait speed. Pt fatigues quickly. Once seated in recliner, pt reports being cold, heated blanket  given.   Stairs            Wheelchair Mobility    Modified Rankin (Stroke Patients Only)       Balance                                    Cognition Arousal/Alertness: Lethargic Behavior During Therapy: WFL for tasks assessed/performed Overall Cognitive Status: Within Functional Limits for tasks assessed                      Exercises      General Comments        Pertinent Vitals/Pain Pain Assessment: No/denies pain    Home Living                      Prior Function            PT Goals (current goals can now be found in the care plan section) Acute Rehab PT Goals Patient Stated Goal: go home PT Goal Formulation: With family Time For Goal Achievement: 05/16/16 Potential to Achieve Goals: Fair Progress towards PT goals: Progressing toward goals    Frequency    Min 2X/week      PT Plan Current plan remains appropriate    Co-evaluation             End of Session   Activity Tolerance: Patient limited by fatigue Patient left: in chair;with family/visitor present (chair alarm pad present, no box in room')  Time: 1740-8144 PT Time Calculation (min) (ACUTE ONLY): 18 min  Charges:  $Therapeutic Activity: 8-22 mins                    G Codes:      Davey Limas May 21, 2016, 10:12 AM  Greggory Stallion, PT, DPT [phone]

## 2016-05-04 NOTE — Consult Note (Signed)
[place] Nurse wound consult note Reason for Consult:Wound to back Wound type:Stage 2 pressure injury mid-back over bony prominence Pressure Ulcer POA: Yes Measurement:1x1x0.2 Wound [id number] Drainage (amount, consistency, odor)  scant Periwound: Intact, fragile Dressing procedure/placement/frequency:Apply foam border over wound, change every 3 days, and prn for soilage.  Lenard Simmer WOC student Will not follow at this time.  Please re-consult if needed.  Domenic Moras RN BSN [place] Pager [phone]

## 2016-05-04 NOTE — Discharge Summary (Signed)
[facility] NAME: Francisco [name]    MR#:  [id number]  DATE OF BIRTH:  Nov 28, 1945  DATE OF ADMISSION:  04/25/2016   ADMITTING PHYSICIAN: Theodoro Grist, MD  DATE OF DISCHARGE:  05/04/2016  PRIMARY CARE PHYSICIAN: Alfonse Flavors, MD   ADMISSION DIAGNOSIS:  Thrombocytopenia ([place]) [D69.6] Guaiac positive stools [R19.5] HCAP (healthcare-associated pneumonia) [J18.9] Sepsis, due to unspecified organism ([place]) [A41.9] Multiple myeloma, remission status unspecified ([place]) [C90.00] DISCHARGE DIAGNOSIS:  Active Problems:   Multiple myeloma in relapse (HCC)   Pancytopenia ([place])   Healthcare-associated pneumonia   Elevated transaminase level   SIRS (systemic inflammatory response syndrome) (HCC)   Protein-calorie malnutrition, severe   Pressure injury of skin  SECONDARY DIAGNOSIS:   Past Medical History:  Diagnosis Date  . Anemia   . Cancer (HCC)    Bone metastasis  . Constipation   . Hearing loss   . Hoarse voice quality   . Liver lesion   . Malaria 2003  . Multiple myeloma ([place]) 03/22/2016   Per Dr. Rogue Bussing on his PET order.  . Tuberculosis 1985   HOSPITAL COURSE:  * Sepsis with HCAP   Likely Aspiration pneumonia       Appreciated ID consult, try to get sputum cx    Abx changed to Zosyn+ azithromycin. stoped azithromycin, continue zosyn.    Also ordered AFB culture. ID suggested to have bronch, viral and fungal tests, if he does not improve.    SLP eval done - now said- pt is able to swallow safe. Zosyn iv for total 14 days (6 more days after discharge) per Dr. Ola Spurr.  * metabolic encephalopathy   Due to healthcare associated pneumonia and malnutrition   Also under effect of pain medications.  improved.  * Metastatic multiple myeloma   Patient looks to have very poor prognosis.   Palliative care appreciated but family still wanted to give it a try if he can tolerate chemotherapy. Dr.  Vicenta Aly suggested if patient has improvement in his nutrition then he may try chemotherapy agents which have given him promise in the results and he may have a few years survival with good quality of life.    * Severe malnutrition   Dietary consult, on TPN.   He may need to address feeding issue for long-term so called GI consult.   Appreciated GI consult, because patient has thrombocytopenia and active infection currently not a candidate for any procedure. Reevaluate in the future if he improves.   As per repeat SLP eval- he can swallow, advised to have small sips and small amount at a time.  encouraged oral intake. Better intake. On protein supplement.  * Pancytopenia   Oncology on the case, keep monitoring.   Had thrombocytopenia and anemia    On 04/29/16- transfused Plt and PRBC. Improving and stable.  * Hyponatremia. Adjust TPN, encourage oral intake. Stable.  Weakness: HHPT. But that the patient could not get home PT due to insurance, he only has Medicaid per CM.  DISCHARGE CONDITIONS:  Stable, discharge to home with [facility] today. CONSULTS OBTAINED:  Treatment Team:  Cammie Sickle, MD Leonel Ramsay, MD Manya Silvas, MD Laverle Hobby, MD DRUG ALLERGIES:  No Known Allergies DISCHARGE MEDICATIONS:     Medication List    TAKE these medications   acyclovir 400 MG tablet Commonly known as:  ZOVIRAX Take 1 tablet (400 mg total) by mouth daily.   albuterol 108 (90 Base) MCG/ACT  inhaler Commonly known as:  PROVENTIL HFA;VENTOLIN HFA Inhale 2 puffs into the lungs every 6 (six) hours as needed for wheezing or shortness of breath.   alum & mag hydroxide-simeth 200-200-20 MG/5ML suspension Commonly known as:  MAALOX/MYLANTA Take 30 mLs by mouth every 4 (four) hours as needed for indigestion or heartburn.   cholecalciferol 1000 units tablet Commonly known as:  VITAMIN D Take 1,000 Units by mouth daily.   dronabinol 2.5 MG capsule Commonly known as:   MARINOL Take 1 capsule (2.5 mg total) by mouth 2 (two) times daily before lunch and supper.   esomeprazole 40 MG capsule Commonly known as:  NEXIUM Take 1 capsule (40 mg total) by mouth 2 (two) times daily before a meal.   fentaNYL 12 MCG/HR Commonly known as:  DURAGESIC - dosed mcg/hr Place 1 patch (12.5 mcg total) onto the skin every 3 (three) days.   HYDROcodone-acetaminophen 5-325 MG tablet Commonly known as:  NORCO/VICODIN Take 1 tablet by mouth every 4 (four) hours as needed for moderate pain.   lactulose 10 GM/15ML solution Commonly known as:  CHRONULAC Take 30 mLs (20 g total) by mouth 2 (two) times daily as needed for mild constipation, moderate constipation or severe constipation.   magnesium hydroxide 400 MG/5ML suspension Commonly known as:  MILK OF MAGNESIA Take 5 mLs by mouth daily as needed for mild constipation.   ondansetron 8 MG disintegrating tablet Commonly known as:  ZOFRAN ODT Take 1 tablet (8 mg total) by mouth every 8 (eight) hours as needed for nausea or vomiting.   ondansetron 8 MG tablet Commonly known as:  ZOFRAN Take 1 tablet (8 mg total) by mouth every 8 (eight) hours as needed for nausea or vomiting.   phosphorus 155-852-130 MG tablet Commonly known as:  K-PHOS-NEUTRAL Take 1 tablet (250 mg total) by mouth 2 (two) times daily.   piperacillin-tazobactam 3.375 GM/50ML IVPB Commonly known as:  ZOSYN Inject 50 mLs (3.375 g total) into the vein every 8 (eight) hours.   prochlorperazine 10 MG tablet Commonly known as:  COMPAZINE Take 1 tablet (10 mg total) by mouth every 6 (six) hours as needed for nausea or vomiting.   protein supplement shake Liqd Commonly known as:  PREMIER PROTEIN Take 325 mLs (11 oz total) by mouth daily. Start taking on:  05/05/2016   sertraline 25 MG tablet Commonly known as:  ZOLOFT Take 1 tablet (25 mg total) by mouth daily.   sucralfate 1 g tablet Commonly known as:  CARAFATE Take 1 tablet (1 g total) by mouth 4  (four) times daily -  with meals and at bedtime.   thiamine 100 MG tablet Commonly known as:  VITAMIN B-1 Take 1 tablet (100 mg total) by mouth daily.   tiotropium 18 MCG inhalation capsule Commonly known as:  SPIRIVA Place 1 capsule (18 mcg total) into inhaler and inhale daily.   vitamin B-12 1000 MCG tablet Commonly known as:  CYANOCOBALAMIN Take 1,000 mcg by mouth daily.   Vitamin D (Ergocalciferol) 50000 units Caps capsule Commonly known as:  DRISDOL Take 50,000 Units by mouth every 7 (seven) days.        DISCHARGE INSTRUCTIONS:  See AVS.  If you experience worsening of your admission symptoms, develop shortness of breath, life threatening emergency, suicidal or homicidal thoughts you must seek medical attention immediately by calling 911 or calling your MD immediately  if symptoms less severe.  You Must read complete instructions/literature along with all the possible adverse reactions/side effects for  all the Medicines you take and that have been prescribed to you. Take any new Medicines after you have completely understood and accpet all the possible adverse reactions/side effects.   Please note  You were cared for by a hospitalist during your hospital stay. If you have any questions about your discharge medications or the care you received while you were in the hospital after you are discharged, you can call the unit and asked to speak with the hospitalist on call if the hospitalist that took care of you is not available. Once you are discharged, your primary care physician will handle any further medical issues. Please note that NO REFILLS for any discharge medications will be authorized once you are discharged, as it is imperative that you return to your primary care physician (or establish a relationship with a primary care physician if you do not have one) for your aftercare needs so that they can reassess your need for medications and monitor your lab values.    On the  day of Discharge:  VITAL SIGNS:  Blood pressure (!) 104/56, pulse 99, temperature 98 F (36.7 C), temperature source Oral, resp. rate (!) 28, height '5\' 10"'$  (1.778 m), weight 79 lb 3.2 oz (35.9 kg), SpO2 99 %. PHYSICAL EXAMINATION:  GENERAL:  70 y.o.-year-old patient lying in the bed with no acute distress. Malnutrition. EYES: Pupils equal, round, reactive to light and accommodation. No scleral icterus. Extraocular muscles intact.  HEENT: Head atraumatic, normocephalic. Oropharynx and nasopharynx clear.  NECK:  Supple, no jugular venous distention. No thyroid enlargement, no tenderness.  LUNGS: Normal breath sounds bilaterally, no wheezing, rales,rhonchi or crepitation. No use of accessory muscles of respiration.  CARDIOVASCULAR: S1, S2 normal. No murmurs, rubs, or gallops.  ABDOMEN: Soft, non-tender, non-distended. Bowel sounds present. No organomegaly or mass.  EXTREMITIES: No pedal edema, cyanosis, or clubbing.  NEUROLOGIC: Cranial nerves II through XII are intact. Muscle strength 2-3/5 in all extremities. Sensation intact. Gait not checked.  PSYCHIATRIC: The patient is alert and oriented x 3.  SKIN: No obvious rash, lesion, or ulcer.  DATA REVIEW:   CBC  Recent Labs Lab 05/04/16 0450  WBC 11.9*  HGB 9.4*  HCT 27.4*  PLT 113*    Chemistries   Recent Labs Lab 04/30/16 0750  05/04/16 0450  NA 141  < > 132*  K 3.9  < > 4.3  CL 113*  < > 105  CO2 23  < > 22  GLUCOSE 147*  < > 132*  BUN 23*  < > 28*  CREATININE 0.55*  < > 0.61  CALCIUM 7.5*  < > 7.4*  MG 1.9  < > 2.1  AST 63*  --   --   ALT 75*  --   --   ALKPHOS 164*  --   --   BILITOT 0.8  --   --   < > = values in this interval not displayed.   Microbiology Results  Results for orders placed or performed during the hospital encounter of 04/25/16  Blood culture (routine x 2)     Status: None   Collection Time: 04/25/16  4:32 PM  Result Value Ref Range Status   Specimen Description BLOOD  Final   Special  Requests NONE  Final   Culture NO GROWTH 5 DAYS  Final   Report Status 04/30/2016 FINAL  Final  Blood culture (routine x 2)     Status: None   Collection Time: 04/25/16  4:32 PM  Result Value  Ref Range Status   Specimen Description BLOOD  Final   Special Requests NONE  Final   Culture NO GROWTH 5 DAYS  Final   Report Status 04/30/2016 FINAL  Final  MRSA PCR Screening     Status: None   Collection Time: 04/26/16 11:35 AM  Result Value Ref Range Status   MRSA by PCR NEGATIVE NEGATIVE Final    Comment:        The GeneXpert MRSA Assay (FDA approved for NASAL specimens only), is one component of a comprehensive MRSA colonization surveillance program. It is not intended to diagnose MRSA infection nor to guide or monitor treatment for MRSA infections.   Culture, expectorated sputum-assessment     Status: None   Collection Time: 04/29/16  9:41 AM  Result Value Ref Range Status   Specimen Description EXPECTORATED SPUTUM  Final   Special Requests Immunocompromised  Final   Sputum evaluation   Final    Sputum specimen not acceptable for testing.  Please recollect.   CALLED TO DIEDRE MALCOLM AT [phone] ON 04/29/16.Marland KitchenMarland KitchenEye Care And Surgery Center [facility]    Report Status 04/29/2016 FINAL  Final    RADIOLOGY:  No results found.   Management plans discussed with the patient, his daughter and they are in agreement.  CODE STATUS:     Code Status Orders        Start     Ordered   04/25/16 2017  Full code  Continuous     04/25/16 2016    Code Status History    Date Active Date Inactive Code Status Order ID Comments User Context   04/06/2016  3:39 AM 04/14/2016 12:51 AM Full Code [id number]  Harrie Foreman, MD Inpatient      TOTAL TIME TAKING CARE OF THIS PATIENT: 58 minutes.    Demetrios Loll M.D on 05/04/2016 at 1:57 PM  Between 7am to 6pm - Pager - [phone]  After 6pm go to [email or web address] - Proofreader  [facility]  Office  [phone]  CC: Primary care physician;  Alfonse Flavors, MD   Note: This dictation was prepared with Dragon dictation along with smaller phrase technology. Any transcriptional errors that result from this process are unintentional.

## 2016-05-04 NOTE — Discharge Instructions (Signed)
Continue TPN. Continue zosyn iv Q8H for 6 days. PICC line care protocol. Regular diet as tolerated. Aspiration and fall precaution. Home heath and PT.

## 2016-05-04 NOTE — Progress Notes (Signed)
Discharge instructions given and went over thoroughly with patient and family at bedside. Prescriptions given. All questions answered. Supplies given to last approximately one day until home health agency is available.   TPN and Zosyn transfusing. PICC to be flushed and saline locked prior to discharge. Family educated.   Patient verbalized understanding. Awaiting EMS transportation. Madlyn Frankel, RN

## 2016-05-04 NOTE — Progress Notes (Signed)
Pt d/c home via stretcher escorted by EMS per orders, daughter present in room

## 2016-05-04 NOTE — Progress Notes (Signed)
Nutrition Follow-up  DOCUMENTATION CODES:   Severe malnutrition in context of chronic illness  INTERVENTION:  As plan is for patient to discharge today, recommend team considers placement of small-bore NG tube to initiate tube feeding and transition patient off TPN. Tube feeding through NG tube would ideally be short-term until patient is a candidate for PEG tube.  While patient remains inpatient, continue TPN of Clinimix 5/20 at goal rate of 55 ml/hr. Continue ILE 20% at 50 ml/hr over 10 hours on M/W/F. Goal TPN infusion provides 1590 kcal, 66 grams protein, 1320 ml fluid with total calories from lipids averaged over 7 days.  Patient will be followed by [facility] for home TPN.  Continue Boost Breeze po TID, each supplement provides 250 kcal and 9 grams of protein.  Discontinued Ensure Enlive per family request. Will order Premier Protein po once daily, each supplement contains 160 kcal and 30 grams protein.  NUTRITION DIAGNOSIS:   Malnutrition related to chronic illness, cancer and cancer related treatments as evidenced by severe depletion of body fat, severe depletion of muscle mass, percent weight loss.  Ongoing.  GOAL:   Patient will meet greater than or equal to 90% of their needs  Meeting with TPN infusion.  MONITOR:   PO intake, Supplement acceptance, Labs, Weight trends  REASON FOR ASSESSMENT:   Malnutrition Screening Tool, Consult Assessment of nutrition requirement/status  ASSESSMENT:   70 y.o male admitted with AMS, SOB, possible pneumonia.  Pt with history of multiple myeloma, with bone mets receiving radiation and chemotherapy( 3 days ago).  Has been having diarrhea for 2 days as well.   -GI has signed off as patient not a candidate for PEG at this time. -New open sore found on mid lower back (stage II).  Patient was resting comfortably in chair at time of assessment. Patient reports his appetite is slowly improving, however still has very inadequate  PO intake. Denies N/V or abdominal pain.   TPN Clinimix E 5/20 running at goal rate of 55 ml/hr.  Medications reviewed and include: Novolog sliding scale Q4hrs, pantoprazole, senna.  Labs reviewed: CBG 126-148 past 24 hrs, Sodium 132 (stable from yesterday), BUN 28, TG 270 on 10/24 (increased from 125 on 10/18 but still within acceptable range for TPN); Potassium, Phosphorus, and Magnesium WNL today.  Weight trend: 35.9 kg on 10/25 (-6.8 kg from admission weight). If this is true weight loss, patient has lost 16% body weight over a little over 1 week. Unsure if this is true weight loss that occurred during admission or bed scale had just not been zeroed out until patient was able to get out of bed today.  Diet Order:  Diet regular Room service appropriate? Yes; Fluid consistency: Thin .TPN (CLINIMIX-E) Adult  Skin:  Wound (see comment) (Stage II to lower back)  Last BM:  05/04/2016  Height:   Ht Readings from Last 1 Encounters:  04/25/16 5' 10"  (1.778 m)    Weight:   Wt Readings from Last 1 Encounters:  05/04/16 79 lb 3.2 oz (35.9 kg)    Ideal Body Weight:     BMI:  Body mass index is 11.36 kg/m.  Estimated Nutritional Needs:   Kcal:  1300-1500 kcals/d  Protein:  52-65 g/d  Fluid:  >/= 1.3 L/c  EDUCATION NEEDS:   Education needs addressed  Willey Blade, MS, RD, LDN Pager: [phone] After Hours Pager: [phone]

## 2016-05-04 NOTE — Progress Notes (Signed)
Infectious Disease Long Term IV Antibiotic Orders  Diagnosis: HCAP in patient with myeloma  Culture results neg  Allergies: No Known Allergies  Discharge antibiotics Zosyn 3.375 grams every 8 hours  PICC Care per protocol Labs weekly while on IV antibiotics      CBC w diff   Comprehensive met panel  Planned duration of antibiotics 2 weeks   Stop date Oct 31  Follow up clinic date On or before Nov 1st FAX weekly labs to [phone]  Leonel Ramsay, MD

## 2016-05-05 ENCOUNTER — Ambulatory Visit: Payer: Medicare Other

## 2016-05-05 ENCOUNTER — Other Ambulatory Visit: Payer: Self-pay

## 2016-05-05 DIAGNOSIS — C9002 Multiple myeloma in relapse: Secondary | ICD-10-CM

## 2016-05-06 ENCOUNTER — Ambulatory Visit: Payer: Medicare Other

## 2016-05-07 LAB — FUNGAL ANTIBODIES PANEL, ID-BLOOD
ASPERGILLUS NIGER: NEGATIVE
Aspergillus flavus: NEGATIVE
Aspergillus fumigatus, IgG: NEGATIVE
BLASTOMYCES ABS, QN, DID: NEGATIVE
HISTOPLASMA AB ID: NEGATIVE

## 2016-05-09 ENCOUNTER — Ambulatory Visit: Payer: Medicare Other

## 2016-05-10 ENCOUNTER — Ambulatory Visit: Payer: Medicaid Other

## 2016-05-10 ENCOUNTER — Inpatient Hospital Stay: Payer: Medicare Other

## 2016-05-10 ENCOUNTER — Ambulatory Visit: Payer: Medicare Other

## 2016-05-10 ENCOUNTER — Ambulatory Visit
Admission: RE | Admit: 2016-05-10 | Discharge: 2016-05-10 | Disposition: A | Discharge status: Home / Self Care | Payer: Medicare Other | Source: Ambulatory Visit | Attending: Internal Medicine | Admitting: Internal Medicine

## 2016-05-10 ENCOUNTER — Other Ambulatory Visit: Payer: Self-pay | Admitting: *Deleted

## 2016-05-10 ENCOUNTER — Ambulatory Visit: Payer: Medicaid Other | Admitting: Internal Medicine

## 2016-05-10 ENCOUNTER — Other Ambulatory Visit: Payer: Medicaid Other

## 2016-05-10 ENCOUNTER — Telehealth: Payer: Self-pay | Admitting: Internal Medicine

## 2016-05-10 ENCOUNTER — Inpatient Hospital Stay (HOSPITAL_BASED_OUTPATIENT_CLINIC_OR_DEPARTMENT_OTHER): Payer: Medicare Other | Admitting: Internal Medicine

## 2016-05-10 VITALS — BP 96/65 | HR 120 | Temp 98.0°F | Resp 26

## 2016-05-10 DIAGNOSIS — R630 Anorexia: Secondary | ICD-10-CM | POA: Diagnosis not present

## 2016-05-10 DIAGNOSIS — M8580 Other specified disorders of bone density and structure, unspecified site: Secondary | ICD-10-CM

## 2016-05-10 DIAGNOSIS — R918 Other nonspecific abnormal finding of lung field: Secondary | ICD-10-CM

## 2016-05-10 DIAGNOSIS — K228 Other specified diseases of esophagus: Secondary | ICD-10-CM | POA: Diagnosis not present

## 2016-05-10 DIAGNOSIS — C9000 Multiple myeloma not having achieved remission: Secondary | ICD-10-CM | POA: Diagnosis not present

## 2016-05-10 DIAGNOSIS — I2510 Atherosclerotic heart disease of native coronary artery without angina pectoris: Secondary | ICD-10-CM | POA: Diagnosis not present

## 2016-05-10 DIAGNOSIS — B370 Candidal stomatitis: Secondary | ICD-10-CM | POA: Diagnosis not present

## 2016-05-10 DIAGNOSIS — K2960 Other gastritis without bleeding: Secondary | ICD-10-CM | POA: Diagnosis not present

## 2016-05-10 DIAGNOSIS — Z5111 Encounter for antineoplastic chemotherapy: Secondary | ICD-10-CM | POA: Diagnosis present

## 2016-05-10 DIAGNOSIS — R1310 Dysphagia, unspecified: Secondary | ICD-10-CM | POA: Diagnosis not present

## 2016-05-10 DIAGNOSIS — R140 Abdominal distension (gaseous): Secondary | ICD-10-CM

## 2016-05-10 DIAGNOSIS — Z789 Other specified health status: Secondary | ICD-10-CM

## 2016-05-10 DIAGNOSIS — C9002 Multiple myeloma in relapse: Secondary | ICD-10-CM

## 2016-05-10 DIAGNOSIS — G893 Neoplasm related pain (acute) (chronic): Secondary | ICD-10-CM

## 2016-05-10 DIAGNOSIS — K5900 Constipation, unspecified: Secondary | ICD-10-CM

## 2016-05-10 DIAGNOSIS — Z8701 Personal history of pneumonia (recurrent): Secondary | ICD-10-CM

## 2016-05-10 DIAGNOSIS — Z8611 Personal history of tuberculosis: Secondary | ICD-10-CM

## 2016-05-10 DIAGNOSIS — E860 Dehydration: Secondary | ICD-10-CM

## 2016-05-10 DIAGNOSIS — Z79899 Other long term (current) drug therapy: Secondary | ICD-10-CM

## 2016-05-10 DIAGNOSIS — J189 Pneumonia, unspecified organism: Secondary | ICD-10-CM | POA: Insufficient documentation

## 2016-05-10 DIAGNOSIS — K769 Liver disease, unspecified: Secondary | ICD-10-CM

## 2016-05-10 DIAGNOSIS — R931 Abnormal findings on diagnostic imaging of heart and coronary circulation: Secondary | ICD-10-CM

## 2016-05-10 DIAGNOSIS — R0600 Dyspnea, unspecified: Secondary | ICD-10-CM | POA: Insufficient documentation

## 2016-05-10 DIAGNOSIS — Z931 Gastrostomy status: Secondary | ICD-10-CM

## 2016-05-10 DIAGNOSIS — I517 Cardiomegaly: Secondary | ICD-10-CM

## 2016-05-10 DIAGNOSIS — D649 Anemia, unspecified: Secondary | ICD-10-CM | POA: Diagnosis not present

## 2016-05-10 DIAGNOSIS — Z8613 Personal history of malaria: Secondary | ICD-10-CM

## 2016-05-10 DIAGNOSIS — R112 Nausea with vomiting, unspecified: Secondary | ICD-10-CM

## 2016-05-10 LAB — COMPREHENSIVE METABOLIC PANEL
ALBUMIN: 2.1 g/dL — AB (ref 3.5–5.0)
ALK PHOS: 199 U/L — AB (ref 38–126)
ALT: 41 U/L (ref 17–63)
ANION GAP: 7 (ref 5–15)
AST: 21 U/L (ref 15–41)
BUN: 20 mg/dL (ref 6–20)
CALCIUM: 7.6 mg/dL — AB (ref 8.9–10.3)
CO2: 22 mmol/L (ref 22–32)
CREATININE: 0.5 mg/dL — AB (ref 0.61–1.24)
Chloride: 98 mmol/L — ABNORMAL LOW (ref 101–111)
GFR calc Af Amer: >60 mL/min (ref >60)
GFR calc non Af Amer: >60 mL/min (ref >60)
GLUCOSE: 160 mg/dL — AB (ref 65–99)
Potassium: 3.3 mmol/L — ABNORMAL LOW (ref 3.5–5.1)
SODIUM: 127 mmol/L — AB (ref 135–145)
Total Bilirubin: 0.6 mg/dL (ref 0.3–1.2)
Total Protein: 5.8 g/dL — ABNORMAL LOW (ref 6.5–8.1)

## 2016-05-10 LAB — CBC WITH DIFFERENTIAL/PLATELET
BASOS ABS: 0 10*3/uL (ref 0–0.1)
BASOS PCT: 1 %
Basophils Absolute: UNDETERMINED 10*3/uL (ref 0–0.1)
Basophils Relative: UNDETERMINED %
EOS ABS: 0 10*3/uL (ref 0–0.7)
EOS PCT: UNDETERMINED %
Eosinophils Absolute: UNDETERMINED 10*3/uL (ref 0–0.7)
Eosinophils Relative: 0 %
HEMATOCRIT: UNDETERMINED % (ref 40.0–52.0)
HEMATOCRIT: 27 % — AB (ref 40.0–52.0)
HEMOGLOBIN: 9.2 g/dL — AB (ref 13.0–18.0)
Hemoglobin: UNDETERMINED g/dL (ref 13.0–18.0)
LYMPHS ABS: UNDETERMINED 10*3/uL (ref 1.0–3.6)
LYMPHS PCT: UNDETERMINED %
LYMPHS PCT: 9 %
Lymphs Abs: 0.6 10*3/uL — ABNORMAL LOW (ref 1.0–3.6)
MCH: UNDETERMINED pg (ref 26.0–34.0)
MCH: 29 pg (ref 26.0–34.0)
MCHC: UNDETERMINED g/dL (ref 32.0–36.0)
MCHC: 34 g/dL (ref 32.0–36.0)
MCV: UNDETERMINED fL (ref 80.0–100.0)
MCV: 85.3 fL (ref 80.0–100.0)
Monocytes Absolute: UNDETERMINED 10*3/uL (ref 0.2–1.0)
Monocytes Absolute: 0.2 10*3/uL (ref 0.2–1.0)
Monocytes Relative: UNDETERMINED %
Monocytes Relative: 3 %
Neutro Abs: UNDETERMINED 10*3/uL (ref 1.4–6.5)
Neutro Abs: 5.8 10*3/uL (ref 1.4–6.5)
Neutrophils Relative %: UNDETERMINED %
Neutrophils Relative %: 87 %
PLATELETS: UNDETERMINED 10*3/uL (ref 150–440)
Platelets: 305 10*3/uL (ref 150–440)
RBC: UNDETERMINED MIL/uL (ref 4.40–5.90)
RBC: 3.17 MIL/uL — AB (ref 4.40–5.90)
RDW: UNDETERMINED % (ref 11.5–14.5)
RDW: 17.4 % — ABNORMAL HIGH (ref 11.5–14.5)
WBC: UNDETERMINED 10*3/uL (ref 3.8–10.6)
WBC: 6.7 10*3/uL (ref 3.8–10.6)

## 2016-05-10 MED ORDER — HEPARIN SOD (PORK) LOCK FLUSH 100 UNIT/ML IV SOLN
500.0000 [IU] | Freq: Once | INTRAVENOUS | Status: AC
  Administered 2016-05-10: 500 [IU] via INTRAVENOUS

## 2016-05-10 MED ORDER — SODIUM CHLORIDE 0.9% FLUSH
10.0000 mL | INTRAVENOUS | Status: DC | PRN
  Administered 2016-05-10: 10 mL via INTRAVENOUS
  Filled 2016-05-10: qty 10

## 2016-05-10 MED ORDER — LENALIDOMIDE 5 MG PO CAPS: 5.0000 mg | ORAL_CAPSULE | Freq: Every day | ORAL | 0 refills | Status: DC

## 2016-05-10 MED ORDER — SODIUM CHLORIDE 0.9 % IV SOLN
Freq: Once | INTRAVENOUS | Status: AC
  Administered 2016-05-10: 12:00:00 via INTRAVENOUS
  Filled 2016-05-10: qty 1000

## 2016-05-10 MED ORDER — FLUCONAZOLE 100 MG PO TABS: 100.0000 mg | ORAL_TABLET | Freq: Every day | ORAL | 0 refills | Status: DC

## 2016-05-10 NOTE — Assessment & Plan Note (Addendum)
Multiple myeloma M protein- 3.1 g/dL; kappa/lambda ratio 140; multiple bone lesions. Status post Velcade/Revlimid- multiple interruptions because of hospitalizations poor tolerance. M protein 0.8 g/dL; K/L= 8.2. HOLD vel-Revlimid. We will get Revlimid 5 mg ordered. Reevaluate in 2 weeks with labs/possible velcade.   # Recent Aspiration Pneumonia- CXR- improved. Continue- IV antibiotics. appt with Dr. Crawford Givens tomorrow.   # Dysphagia/ CT scan showed esophageal dilatation- recommend EGD; possible PEG tube if deemed surgical candidate. Paged Dr. Vira Agar. Dehydration- recommend IV fluids today.  # Pain control-improved; discontinue RT. Discussed with Dr.Crystal.   # Hypocalcemia- hold zometa; cont Vit D  # Anemia hemoglobin 9.5 improving/Stable.   # Thrush- Diflucan 100 mg/day   # Follow-up with me on the in 2 week/Velcade. Above plan of care was discussed with the patient's daughter in detail.   # 40 minutes face-to-face with the patient discussing the above plan of care; more than 50% of time spent on prognosis/ natural history; counseling and coordination.

## 2016-05-10 NOTE — Addendum Note (Signed)
Addended by: Sabino Gasser on: 05/10/2016 03:51 PM   Modules accepted: Orders

## 2016-05-10 NOTE — Progress Notes (Signed)
Patient presence to clinic with c/o shortness of breath. Pt refused to weigh a scale today stating that he is "too weak to weigh."  Upon assessment, pt is laborous in breathing. Resp. 26-30/min. bp 96/65. HR- 121. Daughter reports a recent hp admission for pneumonia; has noted pt's progressive weakness/decreased performance status.  Patient is not taking marinol )States does not have a prescription for marinol)., vitamin D (out of med), and maalox.  Patient declines to use hydrocodone/ fentanyl patch. States "I don't have any pain, so I do not want to use them."  Pt c/o frequent burping episodes.  Declines the use of antiemetics.  Patient declined acylovir x 2 doses. "states he doesn't want to take it."  Patient c/o white patches on tongue.  Patient c/o shortness of breath.  Patient daughter states that resistance was felt when flushing the line by home health RN on the "purple pic line on Right arm". Nurse -[facility] stopped TPN on this line and was restarted within a different line per daughter. All other pic lines have continued to flush well.  Daughter voices multiple concerns regarding care giver fatigue. States that she needs a nurse for 3-4 hours per day. Family having difficulty living in [place] and comuting back and forth to [place].  Family sitting with patient is unfamiliar with TPN lines and [facility] has had to teach a different person on multiple site visits with the patient. Family requesting Dr. Tish Men to order nursing care through [facility] to provide daily nursing care 4 hrs a day. I explained to the patient's caregivers that often insurances will not cover this amount [facility]. Discussed the role of private sitting nursing.  I explained that one of the goals of [facility] is to teach a teachable care giver to provide these services. RN spent 30 mins of Active listening to caregiver's concerns.  RN Acknowledge patient's familie's concerns and questions for Dr.  Prudencio Burly states "I'm particularly interesting in cutting back the amount of treatments days for the velcade."

## 2016-05-10 NOTE — Progress Notes (Signed)
Daughter requested dietary referral to be initiated to discuss concerns with TPN dosing.  Order entered per family request.

## 2016-05-10 NOTE — Telephone Encounter (Signed)
She wants to talk to you about adding fat to his IV. Can you please call her: [phone]

## 2016-05-10 NOTE — Telephone Encounter (Signed)
Face to face discuss with daughter. Will enter dietary consultation ref to discuss concerns and recommendations for TPN. Dr. Rogue Bussing agrees with plan of care.

## 2016-05-10 NOTE — Progress Notes (Signed)
Francisco [name] NOTE  Patient Care Team: Alfonse Flavors, MD as PCP - General (Family Medicine)  Ms. Francisco [name]; [facility]  CHIEF COMPLAINTS/PURPOSE OF CONSULTATION:  Multiple lytic lesions  #  Oncology History   # Multiple Myeloma lytic lesions- Thoracic spine/ vertebral compression fractures; IgG Kappa- 3.1gm/dl; K/L-140.   # Hoarseness of voice?  # Abnormal CXR;? 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  male  With New diagnosis of multiple myeloma IgG kappa- multiple bone lesions/anemia  Is here for Cycle #2 of Velcade.  The interim patient was admitted to the hospital for- aspiration pneumonia; treated with IV antibiotics. Is currently on IV Zosyn last dose today. He was evaluated by GI thought to be a poor candidate for EGD/ PEG   He continues to have poor by mouth intake; he declines oral medication.  Continues to complain of "gas" in the abdomen. Denies any tingling and numbness. Complains of fatigue. Constipation improved. No diarrhea. No nausea vomiting. Continues to have difficulty swallowing. Hiccups.  ROS: A complete 10 point review of system is done which is negative except mentioned above in history of present illness  MEDICAL HISTORY:  Past Medical History:  Diagnosis Date  . Anemia   . Cancer (HCC)    Bone metastasis  . Constipation   . Hearing loss   . Hoarse voice quality   . Liver lesion   . Malaria 2003  . Multiple myeloma ([place]) 03/22/2016   Per Dr. Rogue Bussing on his PET order.  . Tuberculosis 1985    SURGICAL HISTORY: Past Surgical History:  Procedure Laterality Date  . 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 No smoking or alcohol.  Social History   Social History  . Marital status: Married    Spouse name: N/A  . Number of children: N/A  .  Years of education: N/A   Occupational History  . Not on file.   Social History Main Topics  . Smoking status: Never Smoker  . Smokeless tobacco: Never Used  . Alcohol use No  . Drug use: No  . Sexual activity: Not on file   Other Topics Concern  . Not on file   Social History Narrative  . No narrative on file    FAMILY HISTORY: no history of cancers in the family. No family history on file.  ALLERGIES:  has No Known Allergies.  MEDICATIONS:  Current Outpatient Prescriptions  Medication Sig Dispense Refill  . albuterol (PROVENTIL HFA;VENTOLIN HFA) 108 (90 Base) MCG/ACT inhaler Inhale 2 puffs into the lungs every 6 (six) hours as needed for wheezing or shortness of breath. 1 Inhaler 2  . esomeprazole (NEXIUM) 40 MG capsule Take 1 capsule (40 mg total) by mouth 2 (two) times daily before a meal. 60 capsule 6  . phosphorus (K-PHOS-NEUTRAL) 155-852-130 MG tablet Take 1 tablet (250 mg total) by mouth 2 (two) times daily. 60 tablet 0  . piperacillin-tazobactam (ZOSYN) 3.375 GM/50ML IVPB Inject 50 mLs (3.375 g total) into the vein every 8 (eight) hours. 960 mL 0  . protein supplement shake (PREMIER PROTEIN) LIQD Take 325 mLs (11 oz total) by mouth daily. 325 mL 14  . sertraline (ZOLOFT) 25 MG tablet Take 1 tablet (25 mg total) by mouth daily. 30 tablet 0  . sucralfate (CARAFATE) 1 g tablet Take 1  tablet (1 g total) by mouth 4 (four) times daily -  with meals and at bedtime. 30 tablet 1  . thiamine (VITAMIN B-1) 100 MG tablet Take 1 tablet (100 mg total) by mouth daily. 30 tablet 0  . vitamin B-12 (CYANOCOBALAMIN) 1000 MCG tablet Take 1,000 mcg by mouth daily.    Marland Kitchen acyclovir (ZOVIRAX) 400 MG tablet Take 1 tablet (400 mg total) by mouth daily. (Patient not taking: Reported on 05/10/2016) 30 tablet 6  . alum & mag hydroxide-simeth (MAALOX/MYLANTA) 200-200-20 MG/5ML suspension Take 30 mLs by mouth every 4 (four) hours as needed for indigestion or heartburn. (Patient not taking: Reported on  05/10/2016) 355 mL 0  . cholecalciferol (VITAMIN D) 1000 units tablet Take 1,000 Units by mouth daily.    Marland Kitchen dronabinol (MARINOL) 2.5 MG capsule Take 1 capsule (2.5 mg total) by mouth 2 (two) times daily before lunch and supper. (Patient not taking: Reported on 05/10/2016) 30 capsule 0  . fluconazole (DIFLUCAN) 100 MG tablet Take 1 tablet (100 mg total) by mouth daily. 10 tablet 0  . tiotropium (SPIRIVA) 18 MCG inhalation capsule Place 1 capsule (18 mcg total) into inhaler and inhale daily. (Patient not taking: Reported on 05/10/2016) 30 capsule 2  . Vitamin D, Ergocalciferol, (DRISDOL) 50000 units CAPS capsule Take 50,000 Units by mouth every 7 (seven) days.     Current Facility-Administered Medications  Medication Dose Route Frequency Provider Last Rate Last Dose  . ondansetron (ZOFRAN) 8 mg in sodium chloride 0.9 % 50 mL IVPB  8 mg Intravenous Once Cammie Sickle, MD      . ondansetron (ZOFRAN) 8 mg in sodium chloride 0.9 % 50 mL IVPB  8 mg Intravenous Once Cammie Sickle, MD      . ondansetron (ZOFRAN-ODT) disintegrating tablet 8 mg  8 mg Oral Q8H PRN Cammie Sickle, MD       Facility-Administered Medications Ordered in Other Visits  Medication Dose Route Frequency Provider Last Rate Last Dose  . sodium chloride flush (NS) 0.9 % injection 10 mL  10 mL Intravenous PRN Cammie Sickle, MD   10 mL at 05/10/16 0924      .  PHYSICAL EXAMINATION: ECOG PERFORMANCE STATUS: 1 - Symptomatic but completely ambulatory  Vitals:   05/10/16 0945  BP: 96/65  Pulse: (!) 120  Resp: (!) 26  Temp: 98 F (36.7 C)   There were no vitals filed for this visit.  GENERAL: Thin built Cachectic-appearing male patient; Alert, no distress and comfortable.   With family. In a wheelchair. EYES: no pallor or icterus OROPHARYNX: positive for thrush.  NECK: supple, no masses felt LYMPH:  no palpable lymphadenopathy in the cervical, axillary or inguinal regions LUNGS: clear to  auscultation and  No wheeze or crackles HEART/CVS: regular rate & rhythm and no murmurs; No lower extremity edema ABDOMEN: abdomen soft, non-tender and normal bowel sounds Musculoskeletal:no cyanosis of digits and no clubbing  PSYCH: alert & oriented x 3 with fluent speech NEURO: no focal motor/sensory deficits SKIN:  no rashes or significant lesions  LABORATORY DATA:  I have reviewed the data as listed Lab Results  Component Value Date   WBC 6.7 05/10/2016   HGB 9.2 (L) 05/10/2016   HCT 27.0 (L) 05/10/2016   MCV 85.3 05/10/2016   PLT 305 05/10/2016    Recent Labs  04/29/16 0637 04/30/16 0750  05/03/16 0437 05/04/16 0450 05/10/16 1000  NA 149* 141  < > 132* 132* 127*  K 3.5 3.9  < >  4.1 4.3 3.3*  CL 123* 113*  < > 105 105 98*  CO2 23 23  < > 21* 22 22  GLUCOSE 119* 147*  < > 129* 132* 160*  BUN 23* 23*  < > 24* 28* 20  CREATININE 0.70 0.55*  < > 0.59* 0.61 0.50*  CALCIUM 7.5* 7.5*  < > 7.4* 7.4* 7.6*  GFRNONAA >60 >60  < > >60 >60 >60  GFRAA >60 >60  < > >60 >60 >60  PROT 4.9* 5.2*  --   --   --  5.8*  ALBUMIN 1.8* 1.9*  --   --   --  2.1*  AST 41 63*  --   --   --  21  ALT 61 75*  --   --   --  41  ALKPHOS 164* 164*  --   --   --  199*  BILITOT 0.4 0.8  --   --   --  0.6  < > = values in this interval not displayed.  RADIOGRAPHIC STUDIES: I have personally reviewed the radiological images as listed and agreed with the findings in the report. Dg Chest 2 View  Result Date: 05/10/2016 CLINICAL DATA:  Follow up of pneumonia. Shortness of breath. Multiple myeloma. EXAM: CHEST  2 VIEW COMPARISON:  05/01/2016 FINDINGS: Right central line tip: SVC. The patient is rotated to the left on today's radiograph, reducing diagnostic sensitivity and specificity. Improved but not completely resolved left lower lobe airspace opacity. Biapical pleuroparenchymal scarring. T9 and T10 compression fractures with sclerosis in the T10 compression fracture and known bony myeloma in the chest.  IMPRESSION: 1. Improved but not completely radiographically resolved left lower lobe pneumonia. 2. Continued compression fractures in the lower thoracic spine. Bony manifestations of multiple myeloma. Electronically Signed   By: Van Clines M.D.   On: 05/10/2016 11:48   Dg Chest 2 View  Result Date: 05/01/2016 CLINICAL DATA:  Pneumonia, wheezing EXAM: CHEST  2 VIEW COMPARISON:  04/29/2016 FINDINGS: Cardiomediastinal silhouette is stable. Persistent left base retrocardiac atelectasis or infiltrate. Mild hyperinflation. No pulmonary edema. IMPRESSION: Persistent left base retrocardiac atelectasis or infiltrate. No pulmonary edema. Electronically Signed   By: Lahoma Crocker M.D.   On: 05/01/2016 14:27   Dg Chest 2 View  Result Date: 04/29/2016 CLINICAL DATA:  Cough.  Possible aspiration.  Multiple myeloma. EXAM: CHEST  2 VIEW COMPARISON:  04/25/2016 FINDINGS: Right arm PICC line remains in appropriate position. Heart size is within normal limits. Pulmonary hyperinflation remains stable. No evidence of pneumothorax. Airspace disease with air bronchograms again seen in the medial left lower lobe, suspicious for pneumonia. There is also mild symmetric bilateral perihilar airspace disease, suspicious for mild pulmonary edema. Small layering posterior pleural effusion noted. Pulmonary hyperinflation again demonstrated. IMPRESSION: Persistent medial left lower lobe airspace disease and tiny posterior pleural effusion, suspicious for pneumonia. Mild symmetric bilateral perihilar airspace opacity, suspicious for pulmonary edema. Stable cardiomegaly and pulmonary hyperinflation. Electronically Signed   By: Earle Gell M.D.   On: 04/29/2016 16:47   Dg Chest 2 View  Result Date: 04/25/2016 CLINICAL DATA:  Shortness of breath after chemo and radiation treatment earlier today. EXAM: CHEST  2 VIEW COMPARISON:  04/11/2016 FINDINGS: Two views study shows cardiomegaly. Interval development of interstitial and central  airspace disease, right greater than left. Right-sided central line tip projects at the mid SVC level. Telemetry leads overlie the chest. IMPRESSION: Cardiomegaly with interstitial and asymmetric airspace disease, right greater than left, new in the  interval. Asymmetric pulmonary edema or diffuse infection would be considerations. Electronically Signed   By: Misty Stanley M.D.   On: 04/25/2016 17:24   Dg Abd 1 View  Result Date: 04/29/2016 CLINICAL DATA:  Pt has abdominal distention with complaints of some pain. Nonsmoker. Hx of TB. Hx od constipation. Additional history of multiple myeloma for clinical data provided on PET-CT report of 03/29/2016. EXAM: ABDOMEN - 1 VIEW COMPARISON:  None. FINDINGS: Overall bowel gas pattern is nonobstructive. Fairly large amount of gas within the colon. Questionable thickening of the walls of a small bowel loop in the pelvis. No evidence of soft tissue mass or abnormal fluid collection. No evidence of free intraperitoneal air. No pathologic appearing calcifications seen. IMPRESSION: Nonobstructive bowel gas pattern. Fairly large amount of gas within the colon. Questionable thickening of the walls of a small bowel loop in the pelvis (enteritis? ). Electronically Signed   By: Franki Cabot M.D.   On: 04/29/2016 08:59   Ct Head Wo Contrast  Result Date: 04/25/2016 CLINICAL DATA:  History of multiple myeloma and altered mental status EXAM: CT HEAD WITHOUT CONTRAST TECHNIQUE: Contiguous axial images were obtained from the base of the skull through the vertex without intravenous contrast. COMPARISON:  None FINDINGS: Brain: No evidence of acute infarction, hemorrhage, hydrocephalus, extra-axial collection or mass lesion/mass effect. Mild atrophic changes are noted. Vascular: No hyperdense vessel or unexpected calcification. Skull: Normal. Negative for fracture or focal lesion. Sinuses/Orbits: No acute finding. Other: None. IMPRESSION: Mild atrophic changes.  No acute abnormality  is noted. Electronically Signed   By: Inez Catalina M.D.   On: 04/25/2016 18:22   Ct Chest Wo Contrast  Result Date: 04/27/2016 CLINICAL DATA:  New diagnosis of multiple myeloma with bony lesions, on chemotherapy and radiation therapy. Admitted with progressive weakness, shortness of breath, pancytopenia, history TB. EXAM: CT ANGIOGRAPHY CHEST WITH CONTRAST TECHNIQUE: Multidetector CT imaging of the chest was performed using the standard protocol during bolus administration of intravenous contrast. Multiplanar CT image reconstructions and MIPs were obtained to evaluate the vascular anatomy. Additional precontrast images were obtained. CONTRAST:  75 cc Isovue 370 IV. COMPARISON:  PET-CT 03/28/2016, chest radiograph 04/25/2016 FINDINGS: Cardiovascular: Atherosclerotic calcifications aorta without aneurysm or dissection. Pulmonary arteries well opacified and patent. No evidence of pulmonary embolism. No pericardial effusion. Mediastinum/Nodes: Esophagus dilated by air and fluid. No definite thoracic adenopathy. Base of cervical region unremarkable. Lungs/Pleura: Lungs appear emphysematous. Slightly nodular peribronchovascular infiltrates in both lower lobes with dense consolidation of a large portion of LEFT lower lobe as well. Minimal LEFT upper lobe infiltrate as well. Scattered areas of subpleural thickening and scarring. No definite pleural effusion or pneumothorax. Upper Abdomen: Single low-attenuation lesion centrally in liver 13 x 10 mm diameter image 78, question cyst. Remaining visualized upper abdomen unremarkable. Musculoskeletal: Expansile destructive lesion of the posterior LEFT ninth rib. Diffuse osseous demineralization. Additional scattered areas of cortical thinning and destruction in BILATERAL ribs. Lytic lesion at T3 vertebral body. Compression fractures at T4, T9, and T10, suspected to be pathologic. Review of the MIP images confirms the above findings. IMPRESSION: No evidence of pulmonary  embolism. Aortic atherosclerosis. BILATERAL lower lobe infiltrates LEFT greater than RIGHT. Scattered bone lesions with thoracic spine compression fractures at T4, T9, and T10. Electronically Signed   By: Lavonia Dana M.D.   On: 04/26/2016 18:06   Ct Angio Chest Pe W Or Wo Contrast  Result Date: 04/27/2016 CLINICAL DATA:  New diagnosis of multiple myeloma with bony lesions, on chemotherapy and  radiation therapy. Admitted with progressive weakness, shortness of breath, pancytopenia, history TB. EXAM: CT ANGIOGRAPHY CHEST WITH CONTRAST TECHNIQUE: Multidetector CT imaging of the chest was performed using the standard protocol during bolus administration of intravenous contrast. Multiplanar CT image reconstructions and MIPs were obtained to evaluate the vascular anatomy. Additional precontrast images were obtained. CONTRAST:  75 cc Isovue 370 IV. COMPARISON:  PET-CT 03/28/2016, chest radiograph 04/25/2016 FINDINGS: Cardiovascular: Atherosclerotic calcifications aorta without aneurysm or dissection. Pulmonary arteries well opacified and patent. No evidence of pulmonary embolism. No pericardial effusion. Mediastinum/Nodes: Esophagus dilated by air and fluid. No definite thoracic adenopathy. Base of cervical region unremarkable. Lungs/Pleura: Lungs appear emphysematous. Slightly nodular peribronchovascular infiltrates in both lower lobes with dense consolidation of a large portion of LEFT lower lobe as well. Minimal LEFT upper lobe infiltrate as well. Scattered areas of subpleural thickening and scarring. No definite pleural effusion or pneumothorax. Upper Abdomen: Single low-attenuation lesion centrally in liver 13 x 10 mm diameter image 78, question cyst. Remaining visualized upper abdomen unremarkable. Musculoskeletal: Expansile destructive lesion of the posterior LEFT ninth rib. Diffuse osseous demineralization. Additional scattered areas of cortical thinning and destruction in BILATERAL ribs. Lytic lesion at T3  vertebral body. Compression fractures at T4, T9, and T10, suspected to be pathologic. Review of the MIP images confirms the above findings. IMPRESSION: No evidence of pulmonary embolism. Aortic atherosclerosis. BILATERAL lower lobe infiltrates LEFT greater than RIGHT. Scattered bone lesions with thoracic spine compression fractures at T4, T9, and T10. Electronically Signed   By: Lavonia Dana M.D.   On: 04/26/2016 18:06   Mr Jeri Cos OM Contrast  Result Date: 04/30/2016 CLINICAL DATA:  70 year old male with history of multiple myeloma, malaria and tuberculosis presenting with decreased oral intake and altered mental status. Subsequent encounter. EXAM: MRI HEAD WITHOUT AND WITH CONTRAST TECHNIQUE: Multiplanar, multiecho pulse sequences of the brain and surrounding structures were obtained without and with intravenous contrast. CONTRAST:  55m MULTIHANCE GADOBENATE DIMEGLUMINE 529 MG/ML IV SOLN COMPARISON:  04/25/2016 head CT.  No comparison brain MR. FINDINGS: Exam is slightly motion degraded. Brain: No acute infarct or intracranial hemorrhage. Mild punctate and patchy white matter changes most likely related to result of chronic microvascular disease. No intracranial mass or abnormal enhancement. Mild global atrophy without hydrocephalus. Vascular: Major intracranial vascular structures are patent. Skull and upper cervical spine: Abnormal bone marrow upper cervical spine with mild loss height of C3. Slightly speckled appearance of portions of the clivus. Findings may reflect changes of infiltration by myeloma as versus result of anemia. Sinuses/Orbits: Post lens replacement without acute orbital abnormality. Minimal mucosal thickening ethmoid sinus air cells. Partial opacification right mastoid air cells. Other: Negative IMPRESSION: Exam is slightly motion degraded. No acute infarct or intracranial hemorrhage. Mild chronic microvascular changes. No intracranial mass or abnormal enhancement. Mild global atrophy  without hydrocephalus. Abnormal bone marrow upper cervical spine with mild loss of height of C3. Slightly speckled appearance of portions of the clivus. Findings may reflect changes of infiltration by myeloma as versus result of anemia. Electronically Signed   By: SGenia DelM.D.   On: 04/30/2016 09:44   Dg Chest Port 1 View  Result Date: 04/11/2016 CLINICAL DATA:  Status post PICC placement. EXAM: PORTABLE CHEST 1 VIEW COMPARISON:  04/06/2016. FINDINGS: Mildly enlarged cardiac silhouette with an interval mild decrease in size. Right PICC tip in the region of the upper right atrium. Small amount of linear density in the left lower lobe. Previously noted T 90 degrees 10 vertebral compression  fracture. Possible additional vertebral compression fractures, difficult to assess in the frontal projection. Diffuse osteopenia. Mild scoliosis. IMPRESSION: 1. Right PICC tip in the upper right atrium. This could be retracted approximately 6 mm to place it at the superior cavoatrial junction. 2. Small amount of linear atelectasis and probable scarring in the left lower lobe. 3. Mild cardiomegaly with improvement. 4. T10 and possible additional thoracic vertebral compression deformities. Electronically Signed   By: Claudie Revering M.D.   On: 04/11/2016 15:49   Dg Chest Port 1v Same Day  Result Date: 04/11/2016 CLINICAL DATA:  PICC line adjustment EXAM: PORTABLE CHEST 1 VIEW COMPARISON:  Portable exam 1636 hours compared to 1527 hours FINDINGS: Tip of RIGHT arm PICC line now projects over SVC above cavoatrial junction. Enlargement of cardiac silhouette. Atherosclerotic calcification aorta. Pulmonary vascularity normal. Lungs appear unchanged, with atelectasis versus scarring at LEFT base and question underlying emphysematous changes. No pleural effusion or pneumothorax. Bones demineralized. IMPRESSION: Tip of RIGHT arm PICC line now projects over SVC. Enlargement of cardiac silhouette with suspected emphysematous changes  and LEFT lower lobe atelectasis versus scarring. Electronically Signed   By: Lavonia Dana M.D.   On: 04/11/2016 16:54    ASSESSMENT & PLAN:   Multiple myeloma in relapse ([place]) Multiple myeloma M protein- 3.1 g/dL; kappa/lambda ratio 140; multiple bone lesions. Status post Velcade/Revlimid- multiple interruptions because of hospitalizations poor tolerance. M protein 0.8 g/dL; K/L= 8.2. HOLD vel-Revlimid. We will get Revlimid 5 mg ordered. Reevaluate in 2 weeks with labs/possible velcade.   # Recent Aspiration Pneumonia- CXR- improved. Continue- IV antibiotics. appt with Dr. Crawford Givens tomorrow.   # Dysphagia/ CT scan showed esophageal dilatation- recommend EGD; possible PEG tube if deemed surgical candidate. Paged Dr. Vira Agar. Dehydration- recommend IV fluids today.  # Pain control-improved; discontinue RT. Discussed with Dr.Crystal.   # Hypocalcemia- hold zometa; cont Vit D  # Anemia hemoglobin 9.5 improving/Stable.   # Thrush- Diflucan 100 mg/day   # Follow-up with me on the in 2 week/Velcade. Above plan of care was discussed with the patient's daughter in detail.   # 40 minutes face-to-face with the patient discussing the above plan of care; more than 50% of time spent on prognosis/ natural history; counseling and coordination.  All questions were answered. The patient knows to call the clinic with any problems, questions or concerns.    Cammie Sickle, MD 05/10/2016 1:57 PM

## 2016-05-11 ENCOUNTER — Ambulatory Visit: Payer: Medicaid Other | Admitting: Internal Medicine

## 2016-05-11 ENCOUNTER — Ambulatory Visit: Payer: Medicaid Other

## 2016-05-11 ENCOUNTER — Other Ambulatory Visit: Payer: Medicaid Other

## 2016-05-11 ENCOUNTER — Ambulatory Visit: Payer: Medicare Other

## 2016-05-11 ENCOUNTER — Ambulatory Visit: Payer: Medicaid Other | Admitting: Radiation Oncology

## 2016-05-12 ENCOUNTER — Ambulatory Visit: Payer: Medicare Other

## 2016-05-12 ENCOUNTER — Telehealth: Payer: Self-pay | Admitting: Internal Medicine

## 2016-05-12 NOTE — Telephone Encounter (Signed)
She said her father is supposed to have a feeding tube inserted and will be going to [facility] for this and will be admitted for about three days. She is still not sure when they need to bring him to the hospital and they are hoping the [facility] can transport him. Can you please call her to tell her day and time that he needs to arrive? Call: [phone] Thank you.

## 2016-05-12 NOTE — Telephone Encounter (Signed)
Dr. Rogue Bussing personally attempted to contact daughter back. Left msg for cal back.  Daughter called back at 62 am- pt is interested in feeding tube. Dr. Rogue Bussing states that he will consult with the surgical team that would be placing this feeding tube.  Dr. Rogue Bussing will contact the patient's family back once more information is known.  Dr. B explained that our office was waiting to hear back from the patient first before this process was initiated.

## 2016-05-13 ENCOUNTER — Ambulatory Visit: Payer: Medicare Other

## 2016-05-13 ENCOUNTER — Telehealth: Payer: Self-pay | Admitting: *Deleted

## 2016-05-13 NOTE — Telephone Encounter (Signed)
Contacted patient's daughter Allena Katz. Explained that Dr. B talked with the surgeon. Anticipate admission on Tuesday. Asked family to contact cancer center on Tuesday morning to determine when to come for direct admit. Reminded family to have patient take his survey for revlimid.

## 2016-05-13 NOTE — Telephone Encounter (Signed)
rcvd fax from [employer] requesting rems auth #.   I had to personally contact [employer] to update system for REMS program for upcoming dose changing to 5 mg.  [employer] was able to take care of this for me.  Pt will see need to so upcoming pt survey.  I will let pt know this when calling patient's daughter back this afternoon re: this upcoming admission for feeding tube.

## 2016-05-16 ENCOUNTER — Ambulatory Visit: Payer: Medicare Other

## 2016-05-16 ENCOUNTER — Telehealth: Payer: Self-pay | Admitting: Internal Medicine

## 2016-05-16 NOTE — Telephone Encounter (Signed)
Attempted to call. He is on van for tomorrow to be admitted

## 2016-05-16 NOTE — Telephone Encounter (Signed)
They were under the impression that he would have his feeding tube inserted today or tomorrow and they need to reserve his place on the [place] but no one has called them yet and so they still don't know when or where to go. They are very concerned. Please call asap. ([name]: [phone]) Thanks.

## 2016-05-17 ENCOUNTER — Ambulatory Visit: Payer: Medicare Other

## 2016-05-17 ENCOUNTER — Inpatient Hospital Stay: Payer: Medicare Other

## 2016-05-17 ENCOUNTER — Telehealth: Payer: Self-pay | Admitting: Internal Medicine

## 2016-05-17 ENCOUNTER — Inpatient Hospital Stay
Admission: AD | Admit: 2016-05-17 | Discharge: 2016-05-21 | DRG: 391 | Disposition: A | Discharge status: Skilled Nursing Facility | Payer: Medicare Other | Source: Ambulatory Visit | Attending: Internal Medicine | Admitting: Internal Medicine

## 2016-05-17 DIAGNOSIS — K210 Gastro-esophageal reflux disease with esophagitis: Secondary | ICD-10-CM | POA: Diagnosis present

## 2016-05-17 DIAGNOSIS — T451X5A Adverse effect of antineoplastic and immunosuppressive drugs, initial encounter: Secondary | ICD-10-CM

## 2016-05-17 DIAGNOSIS — C7951 Secondary malignant neoplasm of bone: Secondary | ICD-10-CM | POA: Diagnosis present

## 2016-05-17 DIAGNOSIS — Z8611 Personal history of tuberculosis: Secondary | ICD-10-CM | POA: Diagnosis not present

## 2016-05-17 DIAGNOSIS — Z8701 Personal history of pneumonia (recurrent): Secondary | ICD-10-CM

## 2016-05-17 DIAGNOSIS — R262 Difficulty in walking, not elsewhere classified: Secondary | ICD-10-CM

## 2016-05-17 DIAGNOSIS — C9000 Multiple myeloma not having achieved remission: Secondary | ICD-10-CM | POA: Diagnosis present

## 2016-05-17 DIAGNOSIS — Z681 Body mass index (BMI) 19 or less, adult: Secondary | ICD-10-CM

## 2016-05-17 DIAGNOSIS — Z8249 Family history of ischemic heart disease and other diseases of the circulatory system: Secondary | ICD-10-CM

## 2016-05-17 DIAGNOSIS — R627 Adult failure to thrive: Secondary | ICD-10-CM | POA: Diagnosis not present

## 2016-05-17 DIAGNOSIS — R0602 Shortness of breath: Secondary | ICD-10-CM

## 2016-05-17 DIAGNOSIS — R112 Nausea with vomiting, unspecified: Secondary | ICD-10-CM

## 2016-05-17 DIAGNOSIS — Z7401 Bed confinement status: Secondary | ICD-10-CM

## 2016-05-17 DIAGNOSIS — R1310 Dysphagia, unspecified: Secondary | ICD-10-CM | POA: Diagnosis present

## 2016-05-17 DIAGNOSIS — M6281 Muscle weakness (generalized): Secondary | ICD-10-CM

## 2016-05-17 DIAGNOSIS — E43 Unspecified severe protein-calorie malnutrition: Secondary | ICD-10-CM | POA: Diagnosis present

## 2016-05-17 DIAGNOSIS — R066 Hiccough: Secondary | ICD-10-CM | POA: Diagnosis not present

## 2016-05-17 DIAGNOSIS — H9190 Unspecified hearing loss, unspecified ear: Secondary | ICD-10-CM | POA: Diagnosis not present

## 2016-05-17 LAB — COMPREHENSIVE METABOLIC PANEL
ALT: 30 U/L (ref 17–63)
AST: 29 U/L (ref 15–41)
Albumin: 2.4 g/dL — ABNORMAL LOW (ref 3.5–5.0)
Alkaline Phosphatase: 232 U/L — ABNORMAL HIGH (ref 38–126)
Anion gap: 6 (ref 5–15)
BUN: 24 mg/dL — AB (ref 6–20)
CHLORIDE: 106 mmol/L (ref 101–111)
CO2: 26 mmol/L (ref 22–32)
CREATININE: 0.61 mg/dL (ref 0.61–1.24)
Calcium: 8.4 mg/dL — ABNORMAL LOW (ref 8.9–10.3)
GFR calc Af Amer: >60 mL/min (ref >60)
Glucose, Bld: 101 mg/dL — ABNORMAL HIGH (ref 65–99)
Potassium: 3.3 mmol/L — ABNORMAL LOW (ref 3.5–5.1)
Sodium: 138 mmol/L (ref 135–145)
Total Bilirubin: 0.6 mg/dL (ref 0.3–1.2)
Total Protein: 6.8 g/dL (ref 6.5–8.1)

## 2016-05-17 LAB — CBC
HCT: 30.1 % — ABNORMAL LOW (ref 40.0–52.0)
Hemoglobin: 10.2 g/dL — ABNORMAL LOW (ref 13.0–18.0)
MCH: 29.2 pg (ref 26.0–34.0)
MCHC: 33.9 g/dL (ref 32.0–36.0)
MCV: 86.2 fL (ref 80.0–100.0)
PLATELETS: 282 10*3/uL (ref 150–440)
RBC: 3.49 MIL/uL — AB (ref 4.40–5.90)
RDW: 17.9 % — ABNORMAL HIGH (ref 11.5–14.5)
WBC: 8.5 10*3/uL (ref 3.8–10.6)

## 2016-05-17 LAB — PROTIME-INR
INR: 1.48
Prothrombin Time: 18.1 seconds — ABNORMAL HIGH (ref 11.4–15.2)

## 2016-05-17 LAB — MAGNESIUM: MAGNESIUM: 2.1 mg/dL (ref 1.7–2.4)

## 2016-05-17 MED ORDER — ACETAMINOPHEN 325 MG PO TABS
650.0000 mg | ORAL_TABLET | Freq: Four times a day (QID) | ORAL | Status: DC | PRN
  Filled 2016-05-17: qty 2

## 2016-05-17 MED ORDER — ACETAMINOPHEN 650 MG RE SUPP: 650.0000 mg | Freq: Four times a day (QID) | RECTAL | Status: DC | PRN

## 2016-05-17 MED ORDER — SODIUM CHLORIDE 0.9 % IV SOLN
12.5000 mg | Freq: Three times a day (TID) | INTRAVENOUS | Status: DC | PRN
  Filled 2016-05-17: qty 0.5

## 2016-05-17 MED ORDER — ONDANSETRON 8 MG PO TBDP: 8.0000 mg | ORAL_TABLET | Freq: Three times a day (TID) | ORAL | Status: DC | PRN

## 2016-05-17 MED ORDER — DIPHENHYDRAMINE HCL 25 MG PO CAPS
25.0000 mg | ORAL_CAPSULE | Freq: Every evening | ORAL | Status: DC | PRN
  Administered 2016-05-18: 25 mg via ORAL
  Filled 2016-05-17: qty 1

## 2016-05-17 MED ORDER — PREMIER PROTEIN SHAKE
11.0000 [oz_av] | ORAL | Status: DC
  Administered 2016-05-17 – 2016-05-20 (×4): 11 [oz_av] via ORAL

## 2016-05-17 MED ORDER — SODIUM CHLORIDE 0.9 % IV SOLN
INTRAVENOUS | Status: DC
  Administered 2016-05-17 – 2016-05-18 (×3): via INTRAVENOUS

## 2016-05-17 NOTE — Progress Notes (Signed)
Patient ID: Francisco [name], male   DOB: 18-Dec-1945, 70 y.o.   MRN: [id number] Surgery preprocedure evaluation.  Dr. Vira Agar had requested that I assist with placement of PEG tube on this patient. The patient was seen today. He was diagnosed with multiple myeloma and underwent treatment for this. Restaging PET scan has revealed no evidence of residual disease at this time. However the patient had developed significant dysphagia to solid and liquid. Because of this his nutrition was notably affected with a weight loss of about over 10-15 pounds. He was being treated with TPN at home but it was felt that this is the likely to be more cumbersome and that enteral nutrition and may be more appropriate. Exam shows patient to be somewhat cachectic and weak but he is awake and alert. Abdomen is noted to be scaphoid and soft with no palpable masses. Labs are noted to be essentially normal except for a potassium of 3.3 and albumin of 2.4. Platelet count was low couple weeks ago but is now with 282,000. Appears that he is okay to proceed with the planned endoscopy and PEG placement tomorrow. I have discussed this with the patient and his son who was with him.

## 2016-05-17 NOTE — Telephone Encounter (Signed)
Spoke to [place]; hospitalist re: admission for PEG- which is planned tomorrow morning by Drs. Elliot/Sankar. Pt will directly admitted today; recommended to go to hospital at ~10 am today. Dr.Konidena kindly agreed to admit the pt. Will get cbc/cmp/pt/ptt today.

## 2016-05-17 NOTE — Consult Note (Signed)
GI Inpatient Consult Note  Reason for Consult:malnourishment due to multiple medical problems.  Patient in need of feeding tube and now that his platelet count has gone up we can do this tomorrow.   Attending Requesting Consult:Dr. Rogue Bussing  History of Present Illness: Francisco [name] is a 70 y.o. male  Past Medical History:  Past Medical History:  Diagnosis Date  . Anemia   . Cancer (HCC)    Bone metastasis  . Constipation   . Hearing loss   . Hoarse voice quality   . Liver lesion   . Malaria 2003  . Multiple myeloma ([place]) 03/22/2016   Per Dr. Rogue Bussing on his PET order.  . Tuberculosis 1985    Problem List: Patient Active Problem List   Diagnosis Date Noted  . Dysphagia 05/17/2016  . Dyspnea 05/10/2016  . Pressure injury of skin 05/04/2016  . Protein-calorie malnutrition, severe 04/26/2016  . Pancytopenia ([place]) 04/25/2016  . Healthcare-associated pneumonia 04/25/2016  . Elevated transaminase level 04/25/2016  . SIRS (systemic inflammatory response syndrome) ([place]) 04/25/2016  . Anorexia   . Abnormal loss of weight   . Intractable vomiting with nausea 04/06/2016  . Dehydration 04/05/2016  . Multiple myeloma in relapse ([place]) 03/16/2016  . Pernicious anemia 03/16/2016    Past Surgical History: Past Surgical History:  Procedure Laterality Date  . RETINAL DETACHMENT SURGERY  2006    Allergies: No Known Allergies  Home Medications: Facility-Administered Medications Prior to Admission  Medication Dose Route Frequency Provider Last Rate Last Dose  . ondansetron (ZOFRAN) 8 mg in sodium chloride 0.9 % 50 mL IVPB  8 mg Intravenous Once Cammie Sickle, MD      . ondansetron (ZOFRAN) 8 mg in sodium chloride 0.9 % 50 mL IVPB  8 mg Intravenous Once Cammie Sickle, MD      . ondansetron (ZOFRAN-ODT) disintegrating tablet 8 mg  8 mg Oral Q8H PRN Cammie Sickle, MD       Prescriptions Prior to Admission  Medication Sig Dispense Refill Last Dose  .  fluconazole (DIFLUCAN) 100 MG tablet Take 1 tablet (100 mg total) by mouth daily. 10 tablet 0 05/16/2016 at Unknown time  . protein supplement shake (PREMIER PROTEIN) LIQD Take 325 mLs (11 oz total) by mouth daily. 325 mL 14 05/16/2016 at Unknown time  . cholecalciferol (VITAMIN D) 1000 units tablet Take 1,000 Units by mouth daily.   Not Taking at Unknown time  . vitamin B-12 (CYANOCOBALAMIN) 1000 MCG tablet Take 1,000 mcg by mouth daily.   Not Taking at Unknown time  . Vitamin D, Ergocalciferol, (DRISDOL) 50000 units CAPS capsule Take 50,000 Units by mouth every 7 (seven) days.   Not Taking at Unknown time  . [DISCONTINUED] acyclovir (ZOVIRAX) 400 MG tablet Take 1 tablet (400 mg total) by mouth daily. (Patient not taking: Reported on 05/17/2016) 30 tablet 6 Not Taking at Unknown time  . [DISCONTINUED] albuterol (PROVENTIL HFA;VENTOLIN HFA) 108 (90 Base) MCG/ACT inhaler Inhale 2 puffs into the lungs every 6 (six) hours as needed for wheezing or shortness of breath. (Patient not taking: Reported on 05/17/2016) 1 Inhaler 2 Not Taking at Unknown time  . [DISCONTINUED] alum & mag hydroxide-simeth (MAALOX/MYLANTA) 200-200-20 MG/5ML suspension Take 30 mLs by mouth every 4 (four) hours as needed for indigestion or heartburn. (Patient not taking: Reported on 05/17/2016) 355 mL 0 Not Taking at Unknown time  . [DISCONTINUED] dronabinol (MARINOL) 2.5 MG capsule Take 1 capsule (2.5 mg total) by mouth 2 (two) times daily before  lunch and supper. (Patient not taking: Reported on 05/17/2016) 30 capsule 0 Not Taking at Unknown time  . [DISCONTINUED] esomeprazole (NEXIUM) 40 MG capsule Take 1 capsule (40 mg total) by mouth 2 (two) times daily before a meal. (Patient not taking: Reported on 05/17/2016) 60 capsule 6 Not Taking at Unknown time  . [DISCONTINUED] lenalidomide (REVLIMID) 5 MG capsule Take 1 capsule (5 mg total) by mouth daily. (Patient not taking: Reported on 05/17/2016) 21 capsule 0 Not Taking at Unknown time  .  [DISCONTINUED] phosphorus (K-PHOS-NEUTRAL) 155-852-130 MG tablet Take 1 tablet (250 mg total) by mouth 2 (two) times daily. (Patient not taking: Reported on 05/17/2016) 60 tablet 0 Not Taking at Unknown time  . [DISCONTINUED] piperacillin-tazobactam (ZOSYN) 3.375 GM/50ML IVPB Inject 50 mLs (3.375 g total) into the vein every 8 (eight) hours. (Patient not taking: Reported on 05/17/2016) 960 mL 0 Completed Course at Unknown time  . [DISCONTINUED] sertraline (ZOLOFT) 25 MG tablet Take 1 tablet (25 mg total) by mouth daily. (Patient not taking: Reported on 05/17/2016) 30 tablet 0 Not Taking at Unknown time  . [DISCONTINUED] sucralfate (CARAFATE) 1 g tablet Take 1 tablet (1 g total) by mouth 4 (four) times daily -  with meals and at bedtime. (Patient not taking: Reported on 05/17/2016) 30 tablet 1 Not Taking at Unknown time  . [DISCONTINUED] thiamine (VITAMIN B-1) 100 MG tablet Take 1 tablet (100 mg total) by mouth daily. (Patient not taking: Reported on 05/17/2016) 30 tablet 0 Not Taking at Unknown time  . [DISCONTINUED] tiotropium (SPIRIVA) 18 MCG inhalation capsule Place 1 capsule (18 mcg total) into inhaler and inhale daily. (Patient not taking: Reported on 05/17/2016) 30 capsule 2 Not Taking at Unknown time   Home medication reconciliation was completed with the patient.   Scheduled Inpatient Medications:   . protein supplement shake  11 oz Oral Q24H    Continuous Inpatient Infusions:   . sodium chloride 50 mL/hr at 05/17/16 1233    PRN Inpatient Medications:  acetaminophen **OR** acetaminophen, chlorproMAZINE (THORAZINE) IV, ondansetron  Family History: family history includes Hypertension in his mother.  The patient's family history is negative for inflammatory bowel disorders, GI malignancy, or solid organ transplantation.  Social History:   reports that he has never smoked. He has never used smokeless tobacco. He reports that he does not drink alcohol or use drugs. The patient denies ETOH,  tobacco, or drug use.   Review of Systems: Constitutional: Weight is stable.  Eyes: No changes in vision. ENT: No oral lesions, sore throat.  GI: see HPI.  Heme/Lymph: No easy bruising.  CV: No chest pain.  GU: No hematuria.  Integumentary: No rashes.  Neuro: No headaches.  Psych: No depression/anxiety.  Endocrine: No heat/cold intolerance.  Allergic/Immunologic: No urticaria.  Resp: No cough, SOB.  Musculoskeletal: No joint swelling.    Physical Examination: BP 130/80   Pulse 86   Temp 98.4 F (36.9 C) (Oral)   SpO2 100%  Gen: NAD, alert and oriented x 4 HEENT: , EOMI, Neck: supple, no JVD or thyromegaly Chest: CTA bilaterally, no wheezes, crackles, or other adventitious sounds CV: RRR, no m/g/c/r Abd: soft, NT, ND, +BS in all four quadrants; no HSM, guarding, ridigity, or rebound tenderness Ext: no edema Skin: no rash or lesions noted Lymph: no LAD  Data: Lab Results  Component Value Date   WBC 8.5 05/17/2016   HGB 10.2 (L) 05/17/2016   HCT 30.1 (L) 05/17/2016   MCV 86.2 05/17/2016   PLT 282 05/17/2016  Recent Labs Lab 05/17/16 1143  HGB 10.2*   Lab Results  Component Value Date   NA 138 05/17/2016   K 3.3 (L) 05/17/2016   CL 106 05/17/2016   CO2 26 05/17/2016   BUN 24 (H) 05/17/2016   CREATININE 0.61 05/17/2016   Lab Results  Component Value Date   ALT 30 05/17/2016   AST 29 05/17/2016   ALKPHOS 232 (H) 05/17/2016   BILITOT 0.6 05/17/2016    Recent Labs Lab 05/17/16 1143  INR 1.48   Assessment/Plan: Francisco [name] is a 70 y.o. male with malnourishment due to multiple medical problems, especially multiple myeloma.    Recommendations:Feeding tube placement tomorrow with Dr. Jamal Collin.  Thank you for the consult. Please call with questions or concerns.  Gaylyn Cheers, MD

## 2016-05-17 NOTE — H&P (Addendum)
[facility] [place] NAME: Francisco [name]    MR#:  [id number]  DATE OF BIRTH:  [birth year]  DATE OF ADMISSION:  05/17/2016  PRIMARY CARE PHYSICIAN: Alfonse Flavors, MD   REQUESTING/REFERRING PHYSICIAN: Dr Burlene Arnt  CHIEF COMPLAINT:  Sent in for PEG tube placement  HISTORY OF PRESENT ILLNESS:  Francisco [name]  is a 70 y.o. male with a known history of Multiple myeloma, dysphagia and recent pneumonia. Patient is home and basically bedbound at this point secondary to weakness. A direct admission was set up by Dr. Burlene Arnt for PEG tube placement by Dr. Vira Agar. Patient with recent hospitalizations and was started on TPN at home and finished and IV antibiotic course of antibiotics for pneumonia. On the transportation here he was sitting up for an hour and he was a little bit more short of breath than usual and his heart rate was found to be high on presentation. When lying and flat his heart rate and respiratory status improved and he was feeling better. Patient complains of hiccups occasionally. Patient's son at the bedside.  PAST MEDICAL HISTORY:   Past Medical History:  Diagnosis Date  . Anemia   . Cancer (HCC)    Bone metastasis  . Constipation   . Hearing loss   . Hoarse voice quality   . Liver lesion   . Malaria 2003  . Multiple myeloma ([place]) 03/22/2016   Per Dr. Rogue Bussing on his PET order.  . Tuberculosis 1985    PAST SURGICAL HISTORY:   Past Surgical History:  Procedure Laterality Date  . RETINAL DETACHMENT SURGERY  2006    SOCIAL HISTORY:   Social History  Substance Use Topics  . Smoking status: Never Smoker  . Smokeless tobacco: Never Used  . Alcohol use No    FAMILY HISTORY:   Family History  Problem Relation Age of Onset  . Hypertension Mother     DRUG ALLERGIES:  No Known Allergies  REVIEW OF SYSTEMS:  CONSTITUTIONAL: No fever. Positive fatigue and weakness. Positive for weight  loss EYES: No blurred or double vision.  EARS, NOSE, AND THROAT: No tinnitus or ear pain. Recent sore throat. Positive for trouble swallowing. RESPIRATORY: No cough. Positive for shortness of breath. No wheezing or hemoptysis.  CARDIOVASCULAR: No chest pain, orthopnea, edema.  GASTROINTESTINAL: No nausea, vomiting, diarrhea. Occasional gas type abdominal pain. No blood in bowel movements. Occasional constipation GENITOURINARY: No dysuria, hematuria.  ENDOCRINE: No polyuria, nocturia,  HEMATOLOGY: No anemia, easy bruising or bleeding SKIN: No rash or lesion. MUSCULOSKELETAL: No joint pain or arthritis.   NEUROLOGIC: No tingling, numbness, weakness.  PSYCHIATRY: No anxiety or depression.   MEDICATIONS AT HOME:   Prior to Admission medications   Medication Sig Start Date End Date Taking? Authorizing Provider  fluconazole (DIFLUCAN) 100 MG tablet Take 1 tablet (100 mg total) by mouth daily. 05/10/16  Yes Cammie Sickle, MD  protein supplement shake (PREMIER PROTEIN) LIQD Take 325 mLs (11 oz total) by mouth daily. 05/05/16  Yes Demetrios Loll, MD  cholecalciferol (VITAMIN D) 1000 units tablet Take 1,000 Units by mouth daily.    Historical Provider, MD  vitamin B-12 (CYANOCOBALAMIN) 1000 MCG tablet Take 1,000 mcg by mouth daily.    Historical Provider, MD  Vitamin D, Ergocalciferol, (DRISDOL) 50000 units CAPS capsule Take 50,000 Units by mouth every 7 (seven) days.    Historical Provider, MD      VITAL SIGNS:  Blood pressure 108/86, pulse 100, temperature 98.1  F (36.7 C), temperature source Oral, SpO2 100 %.  PHYSICAL EXAMINATION:  GENERAL:  70 y.o.-year-old patient lying in the bed. Cachexia. EYES: Pupils equal, round, reactive to light and accommodation. No scleral icterus. Extraocular muscles intact.  HEENT: Head atraumatic, normocephalic. Oropharynx and nasopharynx clear.  NECK:  Supple, no jugular venous distention. No thyroid enlargement, no tenderness.  LUNGS: Normal breath  sounds bilaterally, no wheezing, rales,rhonchi or crepitation. No use of accessory muscles of respiration.  CARDIOVASCULAR: S1, S2 tachycardia. No murmurs, rubs, or gallops.  ABDOMEN: Soft, nontender, nondistended. Bowel sounds present. No organomegaly or mass.  EXTREMITIES: No pedal edema, cyanosis, or clubbing.  NEUROLOGIC: Cranial nerves II through XII are intact. Muscle strength 5/5 in all extremities. Sensation intact. Gait not checked.  PSYCHIATRIC: The patient is alert and oriented x 3.  SKIN: No rash, lesion, or ulcer.   LABORATORY PANEL:   CBC  Recent Labs Lab 05/17/16 1143  WBC 8.5  HGB 10.2*  HCT 30.1*  PLT 282   ------------------------------------------------------------------------------------------------------------------  Chemistries  Pending    RADIOLOGY:  Chest x-ray ordered  EKG:   Sinus tachycardia 102 bpm left axis deviation, flipped T waves V1 through V5  IMPRESSION AND PLAN:   1. Difficulty swallowing and malnutrition. Patient is on clear liquid diet at home. Nothing by mouth after midnight for PEG tube. 2. Multiple myeloma. Follow up with oncology as outpatient. 3. Weakness and not walking at home physical therapy evaluation. 4. Recent thrush finished Diflucan. 5. Recent pneumonia. Repeat chest x-ray. Patient finished IV antibiotics. 6. Hopefully we can remove PICC line once feeding tube placed. 7. Initial tachycardia with a heart rate of 145. Heart rate did come down to 100 and patient is hemodynamically stable. I think this was from the transportation where he had to sit up. He's been mostly bedbound since the last hospital discharge. 8. Hiccups when necessary Thorazine.  All the records are reviewed and case discussed with ED provider. Management plans discussed with the patient, family and they are in agreement.  CODE STATUS: Full code  TOTAL TIME TAKING CARE OF THIS PATIENT: 55 minutes.    Loletha Grayer M.D on 05/17/2016 at 12:00  PM  Between 7am to 6pm - Pager - [phone]  After 6pm call admission pager [phone]  [facility] Office  [phone]  CC: Primary care physician; Alfonse Flavors, MD

## 2016-05-17 NOTE — Progress Notes (Addendum)
[facility]  Patient Status: Active  [facility] is providing the following services: SN/HHA/TPN  If patient discharges after hours, please call [phone].   Francisco [name] 05/17/2016, 4:43 PM

## 2016-05-18 ENCOUNTER — Inpatient Hospital Stay: Payer: Medicare Other | Admitting: Anesthesiology

## 2016-05-18 ENCOUNTER — Ambulatory Visit: Payer: Medicare Other

## 2016-05-18 ENCOUNTER — Encounter
Admission: AD | Disposition: A | Discharge status: Skilled Nursing Facility | Payer: Self-pay | Source: Ambulatory Visit | Attending: Internal Medicine

## 2016-05-18 ENCOUNTER — Ambulatory Visit: Admit: 2016-05-18 | Payer: Medicare Other | Admitting: Unknown Physician Specialty

## 2016-05-18 DIAGNOSIS — R1310 Dysphagia, unspecified: Secondary | ICD-10-CM | POA: Diagnosis not present

## 2016-05-18 HISTORY — PX: PEG PLACEMENT: SHX5437

## 2016-05-18 LAB — MAGNESIUM: Magnesium: 1.8 mg/dL (ref 1.7–2.4)

## 2016-05-18 LAB — CBC
HCT: 27.8 % — ABNORMAL LOW (ref 40.0–52.0)
Hemoglobin: 9.4 g/dL — ABNORMAL LOW (ref 13.0–18.0)
MCH: 29.1 pg (ref 26.0–34.0)
MCHC: 33.8 g/dL (ref 32.0–36.0)
MCV: 86.2 fL (ref 80.0–100.0)
PLATELETS: 239 10*3/uL (ref 150–440)
RBC: 3.23 MIL/uL — ABNORMAL LOW (ref 4.40–5.90)
RDW: 17.6 % — AB (ref 11.5–14.5)
WBC: 6.7 10*3/uL (ref 3.8–10.6)

## 2016-05-18 LAB — BASIC METABOLIC PANEL
Anion gap: 7 (ref 5–15)
BUN: 26 mg/dL — AB (ref 6–20)
CALCIUM: 8 mg/dL — AB (ref 8.9–10.3)
CHLORIDE: 110 mmol/L (ref 101–111)
CO2: 23 mmol/L (ref 22–32)
CREATININE: 0.58 mg/dL — AB (ref 0.61–1.24)
GFR calc Af Amer: >60 mL/min (ref >60)
GFR calc non Af Amer: >60 mL/min (ref >60)
Glucose, Bld: 83 mg/dL (ref 65–99)
Potassium: 3.2 mmol/L — ABNORMAL LOW (ref 3.5–5.1)
SODIUM: 140 mmol/L (ref 135–145)

## 2016-05-18 LAB — PHOSPHORUS: Phosphorus: 3.2 mg/dL (ref 2.5–4.6)

## 2016-05-18 LAB — GLUCOSE, CAPILLARY
GLUCOSE-CAPILLARY: 78 mg/dL (ref 65–99)
GLUCOSE-CAPILLARY: 88 mg/dL (ref 65–99)

## 2016-05-18 SURGERY — INSERTION, PEG TUBE
Anesthesia: General

## 2016-05-18 SURGERY — ESOPHAGOGASTRODUODENOSCOPY (EGD) WITH PROPOFOL
Anesthesia: General

## 2016-05-18 MED ORDER — JEVITY 1.2 CAL PO LIQD
1000.0000 mL | ORAL | Status: DC
  Administered 2016-05-19: 23:00:00
  Administered 2016-05-19 – 2016-05-20 (×2): 1000 mL

## 2016-05-18 MED ORDER — MORPHINE SULFATE (PF) 2 MG/ML IV SOLN
0.5000 mg | Freq: Four times a day (QID) | INTRAVENOUS | Status: DC | PRN
  Administered 2016-05-18: 10:00:00 0.5 mg via INTRAVENOUS
  Filled 2016-05-18: qty 1

## 2016-05-18 MED ORDER — POTASSIUM CHLORIDE 20 MEQ PO PACK
40.0000 meq | PACK | Freq: Once | ORAL | Status: AC
  Administered 2016-05-18: 40 meq via ORAL
  Filled 2016-05-18: qty 2

## 2016-05-18 MED ORDER — PROPOFOL 500 MG/50ML IV EMUL
INTRAVENOUS | Status: DC | PRN
  Administered 2016-05-18: 65 ug/kg/min via INTRAVENOUS

## 2016-05-18 MED ORDER — LIDOCAINE HCL (CARDIAC) 20 MG/ML IV SOLN
INTRAVENOUS | Status: DC | PRN
  Administered 2016-05-18: 30 mg via INTRAVENOUS

## 2016-05-18 MED ORDER — PROPOFOL 10 MG/ML IV BOLUS
INTRAVENOUS | Status: DC | PRN
  Administered 2016-05-18: 20 mg via INTRAVENOUS
  Administered 2016-05-18: 30 mg via INTRAVENOUS

## 2016-05-18 MED ORDER — FENTANYL CITRATE (PF) 100 MCG/2ML IJ SOLN
INTRAMUSCULAR | Status: DC | PRN
  Administered 2016-05-18 (×2): 50 ug via INTRAVENOUS

## 2016-05-18 MED ORDER — CITALOPRAM HYDROBROMIDE 20 MG PO TABS
20.0000 mg | ORAL_TABLET | Freq: Every day | ORAL | Status: DC
  Administered 2016-05-18 – 2016-05-21 (×4): 20 mg via ORAL
  Filled 2016-05-18 (×4): qty 1

## 2016-05-18 MED ORDER — PANTOPRAZOLE SODIUM 40 MG PO TBEC
40.0000 mg | DELAYED_RELEASE_TABLET | Freq: Two times a day (BID) | ORAL | Status: DC
  Administered 2016-05-18 – 2016-05-19 (×3): 40 mg via ORAL
  Filled 2016-05-18 (×3): qty 1

## 2016-05-18 MED ORDER — KETOROLAC TROMETHAMINE 15 MG/ML IJ SOLN
15.0000 mg | Freq: Three times a day (TID) | INTRAMUSCULAR | Status: DC | PRN
  Administered 2016-05-18 – 2016-05-21 (×7): 15 mg via INTRAVENOUS
  Filled 2016-05-18 (×7): qty 1

## 2016-05-18 NOTE — Anesthesia Preprocedure Evaluation (Signed)
Anesthesia Evaluation  Patient identified by MRN, date of birth, ID band Patient awake    Reviewed: Allergy & Precautions, NPO status , Patient's Chart, lab work & pertinent test results  History of Anesthesia Complications Negative for: history of anesthetic complications  Airway Mallampati: II  TM Distance: >3 FB Neck ROM: Full    Dental  (+) Poor Dentition   Pulmonary shortness of breath, neg sleep apnea, pneumonia, resolved,    breath sounds clear to auscultation- rhonchi (-) wheezing      Cardiovascular Exercise Tolerance: Poor (-) hypertension(-) CAD and (-) Past MI  Rhythm:Regular Rate:Normal - Systolic murmurs and - Diastolic murmurs    Neuro/Psych negative neurological ROS  negative psych ROS   GI/Hepatic Neg liver ROS, Dysphagia    Endo/Other  negative endocrine ROS  Renal/GU negative Renal ROS     Musculoskeletal negative musculoskeletal ROS (+)   Abdominal (+) - obese,   Peds  Hematology  (+) anemia , Multiple myeloma    Anesthesia Other Findings Past Medical History: No date: Anemia No date: Cancer ([facility])     Comment: Bone metastasis No date: Constipation No date: Hearing loss No date: Hoarse voice quality No date: Liver lesion 2003: Malaria 03/22/2016: Multiple myeloma ([place])     Comment: Per Dr. Rogue Bussing on his PET order. 1985: Tuberculosis   Reproductive/Obstetrics                             Anesthesia Physical Anesthesia Plan  ASA: III  Anesthesia Plan: General   Post-op Pain Management:    Induction: Intravenous  Airway Management Planned: Natural Airway  Additional Equipment:   Intra-op Plan:   Post-operative Plan:   Informed Consent: I have reviewed the patients History and Physical, chart, labs and discussed the procedure including the risks, benefits and alternatives for the proposed anesthesia with the patient or authorized representative  who has indicated his/her understanding and acceptance.   Dental advisory given and Consent reviewed with POA  Plan Discussed with: CRNA and Anesthesiologist  Anesthesia Plan Comments:         Anesthesia Quick Evaluation

## 2016-05-18 NOTE — Procedures (Signed)
Preop diagnosis: Dysphagia   Post op diagnosis: Same   Operation: Insertion percutaneous gastrostomy tube with endoscopic guidance  Surgeon: Mckinley Jewel  Endoscopist: Gaylyn Cheers M.D.   Anesthesia: IV propofol  Complications: None  EBL: Minimal  Drains: None  Description: Patient was adequately sedated and monitored. Endoscopy was performed by Dr. Vira Agar and it was noted that he had no mechanical obstructive element in the esophagus. With the stomach adequately inflated suitable location in the epigastric region was selected for placement of the gastrostomy tube. This area was prepped with ChloraPrep. Local anesthetic of 1% Xylocaine was instilled totaling about 6 mL. 3 anchoring T bars were then placed to hold up the stomach wall against the abdominal wall. A small incision was made in the middle and through this using the Seldinger technique an 18 French tube was then positioned and the balloon inflated in place. The tube was pulled up snug and the skin shield placed against the skin at the 3 cm mark. The main epigastric port was noted to flush easily. Axis of the anchoring stitches removed. The shield was fixed to the skin with 3 stitches of 2-0 silk. Dry gauze and tape applied around the tube. Patient was subsequently returned recovery in stable condition

## 2016-05-18 NOTE — Anesthesia Procedure Notes (Signed)
Date/Time: 05/18/2016 7:49 AM Performed by: Johnna Acosta Pre-anesthesia Checklist: Patient identified, Emergency Drugs available, Suction available, Patient being monitored and Timeout performed Patient Re-evaluated:Patient Re-evaluated prior to inductionOxygen Delivery Method: Nasal cannula

## 2016-05-18 NOTE — Op Note (Signed)
[facility] Patient Name: Francisco [name] Procedure Date: 05/18/2016 7:46 AM MRN: [id number] Account #: [id number] Date of Birth: July 01, 1946 Admit Type: Inpatient Age: 70 Room: [facility] ENDO ROOM 4 Gender: Male Note Status: Finalized Procedure:            Upper GI endoscopy Indications:          Place PEG because patient is unable to eat Providers:            Manya Silvas, MD Referring MD:         No Local Md, MD (Referring MD) Medicines:            Propofol per Anesthesia Complications:        No immediate complications. Procedure:            Pre-Anesthesia Assessment:                       - After reviewing the risks and benefits, the patient                        was deemed in satisfactory condition to undergo the                        procedure.                       After obtaining informed consent, the endoscope was                        passed under direct vision. Throughout the procedure,                        the patient's blood pressure, pulse, and oxygen                        saturations were monitored continuously. The Endoscope                        was introduced through the mouth, and advanced to the                        second part of duodenum. The upper GI endoscopy was                        accomplished without difficulty. The patient tolerated                        the procedure well. Findings:      LA Grade C (one or more mucosal breaks continuous between tops of 2 or       more mucosal folds, less than 75% circumference) esophagitis with no       bleeding was found 27 cm from the incisors. This extended to the GEJ.      The entire examined stomach was normal. Dr. Jamal Collin performed a PEG       placement with push technique after needle stick and placement of 3 T-       wires with buttons.      The examined duodenum was normal. Impression:           - LA Grade C reflux esophagitis.                       -  Normal  stomach.                       - Normal examined duodenum.                       - No specimens collected. Recommendation:       Start tube feeding in 24 hours. Nutrition consult. PPI                        iv bid until start the tube feeding.                       - The findings and recommendations were discussed with                        the surgeon. Manya Silvas, MD 05/18/2016 8:25:14 AM This report has been signed electronically. Number of Addenda: 0 Note Initiated On: 05/18/2016 7:46 AM      [facility]

## 2016-05-18 NOTE — Progress Notes (Addendum)
[facility] [place] NAME: Ronson Hagins    MR#:  [id number]  DATE OF BIRTH:  [birth year]  SUBJECTIVE:  Cam e in after having ongoing dysphagia and severe weight loss  REVIEW OF SYSTEMS:   Review of Systems  Constitutional: Negative for chills, fever and weight loss.  HENT: Negative for ear discharge, ear pain and nosebleeds.   Eyes: Negative for blurred vision, pain and discharge.  Respiratory: Negative for sputum production, shortness of breath, wheezing and stridor.   Cardiovascular: Negative for chest pain, palpitations, orthopnea and PND.  Gastrointestinal: Positive for abdominal pain. Negative for diarrhea, nausea and vomiting.  Genitourinary: Negative for frequency and urgency.  Musculoskeletal: Negative for back pain and joint pain.  Neurological: Positive for weakness. Negative for sensory change, speech change and focal weakness.  Psychiatric/Behavioral: Negative for depression and hallucinations. The patient is not nervous/anxious.    Tolerating Diet:cld Tolerating PT: snf  DRUG ALLERGIES:  No Known Allergies  VITALS:  Blood pressure 127/75, pulse 86, temperature 97.6 F (36.4 C), temperature source Oral, resp. rate 15, height _0  (1.676 m), weight 40.2 kg (88 lb 11.2 oz), SpO2 99 %.  PHYSICAL EXAMINATION:   Physical Exam  GENERAL:  70 y.o.-year-old patient lying in the bed with no acute distress. Thin cachectic EYES: Pupils equal, round, reactive to light and accommodation. No scleral icterus. Extraocular muscles intact.  HEENT: Head atraumatic, normocephalic. Oropharynx and nasopharynx clear.  NECK:  Supple, no jugular venous distention. No thyroid enlargement, no tenderness.  LUNGS: Normal breath sounds bilaterally, no wheezing, rales, rhonchi. No use of accessory muscles of respiration.  CARDIOVASCULAR: S1, S2 normal. No murmurs, rubs, or gallops.  ABDOMEN: Soft, nontender, nondistended. Bowel sounds  present. No organomegaly or mass. PEG++ EXTREMITIES: No cyanosis, clubbing or edema b/l.    NEUROLOGIC: Cranial nerves II through XII are intact. No focal Motor or sensory deficits b/l.   PSYCHIATRIC:  patient is alert and oriented x 3.  SKIN: No obvious rash, lesion, or ulcer.   LABORATORY PANEL:  CBC  Recent Labs Lab 05/18/16 0555  WBC 6.7  HGB 9.4*  HCT 27.8*  PLT 239    Chemistries   Recent Labs Lab 05/17/16 1143 05/18/16 0555  NA 138 140  K 3.3* 3.2*  CL 106 110  CO2 26 23  GLUCOSE 101* 83  BUN 24* 26*  CREATININE 0.61 0.58*  CALCIUM 8.4* 8.0*  MG 2.1 1.8  AST 29  --   ALT 30  --   ALKPHOS 232*  --   BILITOT 0.6  --    Cardiac Enzymes No results for input(s): TROPONINI in the last 168 hours. RADIOLOGY:  Dg Chest 1 View  Result Date: 05/17/2016 CLINICAL DATA:  70 year old male with increased shortness of breath and a past history of cancer. EXAM: CHEST 1 VIEW COMPARISON:  Prior chest x-ray 05/10/2016; prior CT scan of the chest 04/26/2016 FINDINGS: Stable cardiomegaly and mediastinal contours. Atherosclerotic calcifications again noted in the transverse aorta. Right upper extremity PICC with the catheter tip overlying the mid SVC. Persistent patchy left lower lobe airspace opacity. The right lung base appears clear. Background hyperinflation and chronic bronchitic changes suggesting COPD. No acute osseous abnormality. Similar appearance of expansile lytic lesion the medial aspect of the left ninth rib. IMPRESSION: 1. Persistent left lower lobe patchy airspace opacity may be slightly improved compared to 05/10/2016. Differential considerations include residual pneumonia, pleural parenchymal scarring in a region of resolving pneumonia, or  less likely malignancy. 2. Similar appearance of expansile lesion in the posteromedial aspect of the left ninth rib consistent with patient's known multiple myeloma. 3. Stable position of right upper extremity approach PICC. 4. Stable  cardiomegaly. 5.  Aortic Atherosclerosis (ICD10-170.0) Electronically Signed   By: Jacqulynn Cadet M.D.   On: 05/17/2016 16:06   ASSESSMENT AND PLAN:   Breeze Angell  is a 70 y.o. male with a known history of Multiple myeloma, dysphagia and recent pneumonia. Patient is home and basically bedbound at this point secondary to weakness. A direct admission was set up by Dr. Burlene Arnt for PEG tube placement by Dr. Vira Agar. Patient with recent hospitalizations and was started on TPN at home and finished and IV antibiotic course of antibiotics for pneumonia  1. Difficulty swallowing and sever protein calorie malnutrition - Patient is on clear liquid diet at home.  --appreciate Dr Tiffany Kocher and dr Jamal Collin for PEG placement -will start TF in am  2. Multiple myeloma. Follow up with oncology as outpatient.  3. Weakness and not walking at home physical therapy evaluation.  4. Recent thrush  Pt just finished Diflucan.  5. Recent pneumonia. Repeat chest x-ray. Patient finished IV antibiotics.  6. Hiccups when necessary Thorazine.  D/w son in the room  Case discussed with Care Management/Social Worker. Management plans discussed with the patient, family and they are in agreement.  CODE STATUS: FULL DVT Prophylaxis: lovenox  TOTAL TIME TAKING CARE OF THIS PATIENT: 30 minutes.  >50% time spent on counselling and coordination of care  POSSIBLE D/C IN 1-2 DAYS, DEPENDING ON CLINICAL CONDITION.  Note: This dictation was prepared with Dragon dictation along with smaller phrase technology. Any transcriptional errors that result from this process are unintentional.  Suman Trivedi M.D on 05/18/2016 at 4:56 PM  Between 7am to 6pm - Pager - [phone]  After 6pm go to [email or web address] - password [email or web address] [facility]  Office  [phone]  CC: Primary care physician; Alfonse Flavors, MD

## 2016-05-18 NOTE — Progress Notes (Signed)
Initial Nutrition Assessment  DOCUMENTATION CODES:   Severe malnutrition in context of chronic illness  INTERVENTION:  Continue Premier Protein shake per MD as patient is on Clear Liquid Diet. Each supplement provides 160 kcal and 30 grams protein.  Monitor magnesium, potassium, and phosphorus daily until patient stable at goal TF rate, MD to replete as needed, as pt is at risk for refeeding syndrome given severe chronic malnutrition, low potassium.  Patient will require aggressive repletion of electrolytes daily.  After 24 hours, recommend initiating Jevity 1.2 via PEG tube @ 20 ml/hr. Advance by 10 ml/hr every 12 hours as tolerated. Goal TF rate is Jevity 1.2 @ 50 ml/hr. This provides 1440 kcal, 66 grams protein, 972 ml H2O daily.  NUTRITION DIAGNOSIS:   Malnutrition (Severe) related to chronic illness, cancer and cancer related treatments as evidenced by severe depletion of muscle mass, severe depletion of body fat, 13 percent weight loss over 1 month.  GOAL:   Patient will meet greater than or equal to 90% of their needs  MONITOR:   PO intake, Supplement acceptance, Labs, Weight trends, TF tolerance, I & O's, Skin  REASON FOR ASSESSMENT:   Malnutrition Screening Tool, Consult, Other (Comment) (Low BMI) Enteral/tube feeding initiation and management  ASSESSMENT:   70 y.o. male with a known history of Multiple myeloma, dysphagia and recent pneumonia. Patient is home and basically bedbound at this point secondary to weakness. A direct admission was set up by Dr. Burlene Arnt for PEG tube placement by Dr. Vira Agar. Patient with recent hospitalizations and was started on TPN at home and finished and IV antibiotic course of antibiotics for pneumonia.   -Patient now s/p PEG tube placement today by Surgery. Tube has drainage bag now. Plan is to allow drainage for 24 hrs then begin TF tomorrow. -TPN Regimen by [facility]: Custom TPN with 250 grams dextrose, 70 grams protein, 30  grams lipid @ 60 ml/hr over 24 hrs. Provided 1430 kcal, 70 grams protein, 1440 ml fluid daily. Plan is to d/c PICC line with initiation of TF. Will not be running TPN here.  Spoke with family at bedside. They report patient seemed to be improving on TPN as he was looking "better" on observation and has been able to talk with them this past week and is more alert. He is also reporting hunger, but still only able to tolerate sips of clear liquids, soup, Ensure, or Premier Protein shake. They are requesting he be put back on CLD so he can have sips of liquids as tolerated.  Medications reviewed and include: NS @ 50 ml/hr, Zofran 8 mg Q8hrs PRN.  Labs reviewed: Potassium 3.2, BUN 26, Creatinine 0.48.   Nutrition-Focused physical exam completed. Findings are severe fat depletion, severe muscle depletion, and mild edema.   Weight trend: On last assessment patient weighed 79 lbs. Tech obtained current weight of 88 lbs with zeroed bed scale so weight is accurate. Unsure if patient has actually gained back this weight or if previous weight of 79 lbs was inaccurate. Patient had previously lost 13% body weight over 1 month, which is significant for time frame.  Discussed with RN.   Diet Order:  Diet clear liquid Room service appropriate? Yes; Fluid consistency: Thin  Skin:  Wound (see comment) (Stg II to lower back)  Last BM:  05/16/2016  Height:   Ht Readings from Last 1 Encounters:  05/18/16 5' 6" (1.676 m)    Weight:   Wt Readings from Last 1 Encounters:  05/18/16  88 lb 11.2 oz (40.2 kg)    Ideal Body Weight:  64.54 kg  BMI:  Body mass index is 14.32 kg/m.  Estimated Nutritional Needs:   Kcal:  1325-1500 (33-37 kcal/day)  Protein:  60-72 grams (1.5-1.8 grams/kg)  Fluid:  >/= 1.3 L/day  EDUCATION NEEDS:   Education needs addressed (Reviewed what TF is with family and answered questions.)   Willey Blade, MS, RD, LDN Pager: [phone] After Hours Pager: [phone]

## 2016-05-18 NOTE — Consult Note (Signed)
PEG performed  By Dr. Jamal Collin and will be ready for use in 24 hours.  Clear liquids as desired today.  Significant esophagitis in lower 1/3.

## 2016-05-18 NOTE — Evaluation (Signed)
Physical Therapy Evaluation Patient Details Name: Francisco [name] MRN: [id number] DOB: April 06, 1946 Today's Date: 05/18/2016   History of Present Illness  Pt is a 70 y.o. male with a known history of Multiple myeloma, dysphagia and recent pneumonia. Patient was home and basically bedbound recently secondary to weakness and per family has recently completed rounds of chemo/radiation for multiple myeloma.  A direct admission was set up by Dr. Burlene Arnt for PEG tube placement by Dr. Vira Agar. Patient with recent hospitalizations and was started on TPN at home and finished and IV antibiotic course of antibiotics for pneumonia. On the transportation here he was sitting up for an hour and he was a little bit more short of breath than usual and his heart rate was found to be high on presentation. When lying and flat his heart rate and respiratory status improved and he was feeling better. Pt is s/p acute PEG placement.   Clinical Impression  Pt presents with deficits in strength, transfers, mobility, gait, balance, and activity tolerance.  Limited eval secondary to profound deficits in activity tolerance limiting pt's ability to participate in any functional mobility task other than rolling in bed.  With combination of recent events including radiation/chemo treatment for multiple myeloma, significant weight loss with acute PEG tube placement, and recent completion of ABX for PNA, pt's lack of strength and activity tolerance is unsurprising.  Pt will benefit from PT services to address above deficits for decreased caregiver assistance upon discharge.       Follow Up Recommendations SNF    Equipment Recommendations  None recommended by PT (Pt has a RW)    Recommendations for Other Services       Precautions / Restrictions Precautions Precautions: Fall Restrictions Weight Bearing Restrictions: No      Mobility  Bed Mobility Overal bed mobility: Needs Assistance Bed Mobility: Rolling Rolling:  Min assist         General bed mobility comments: Pt refused attempt to perform sit to/from supine secondary to weakness  Transfers Overall transfer level:  (Unable secondary to weakness/lethargy)               General transfer comment: Unable to transfer secondary to weakness/lethargy  Ambulation/Gait             General Gait Details: Unable to ambulate secondary to weakness/lethargy  Stairs            Wheelchair Mobility    Modified Rankin (Stroke Patients Only)       Balance Overall balance assessment:  (Unable to assess)                                           Pertinent Vitals/Pain Pain Assessment: No/denies pain    Home Living Family/patient expects to be discharged to:: Private residence Living Arrangements: Spouse/significant other;Children (Children have recently begun taking turns staying with pt, spouse is also able-bodied) Available Help at Discharge: Family Type of Home: House Home Access: Level entry     Home Layout: One level Home Equipment: Environmental consultant - 2 wheels;Wheelchair - manual      Prior Function Level of Independence: Needs assistance   Gait / Transfers Assistance Needed: SBA with gait household distances with RW  ADL's / Homemaking Assistance Needed: Assistance with all ADLs from family for safety        Hand Dominance        Extremity/Trunk  Assessment   Upper Extremity Assessment: Generalized weakness           Lower Extremity Assessment: Generalized weakness         Communication   Communication: Other (comment) (History mostly from son secondary to pt's lethargy)  Cognition Arousal/Alertness: Lethargic Behavior During Therapy: Flat affect Overall Cognitive Status: Difficult to assess                      General Comments      Exercises Total Joint Exercises Ankle Circles/Pumps: AROM;Both;5 reps Quad Sets: AROM;Both;5 reps Gluteal Sets: AROM;Both;5 reps Other  Exercises Other Exercises: Pt/family education on HEP with BLE APs, QS, and GS x 10 reps 3-5x/day.  Pt/family education on principles of activity progression and physiological benefits of activity.   Assessment/Plan    PT Assessment Patient needs continued PT services  PT Problem List Decreased strength;Decreased activity tolerance;Decreased balance;Decreased mobility          PT Treatment Interventions DME instruction;Gait training;Functional mobility training;Therapeutic activities;Therapeutic exercise;Balance training;Neuromuscular re-education;Patient/family education    PT Goals (Current goals can be found in the Care Plan section)  Acute Rehab PT Goals Patient Stated Goal: go home PT Goal Formulation: With patient/family Time For Goal Achievement: 05/31/16 Potential to Achieve Goals: Fair    Frequency 7X/week   Barriers to discharge        Co-evaluation               End of Session   Activity Tolerance: Patient limited by fatigue Patient left: in bed;with bed alarm set;with call bell/phone within reach;with family/visitor present           Time: 3532-9924 PT Time Calculation (min) (ACUTE ONLY): 28 min   Charges:   PT Evaluation $PT Eval Moderate Complexity: 1 Procedure PT Treatments $Therapeutic Activity: 8-22 mins   PT G Codes:       DRoyetta Asal PT, DPT 05/18/16, 12:04 PM

## 2016-05-18 NOTE — Anesthesia Postprocedure Evaluation (Signed)
Anesthesia Post Note  Patient: Kayler Kosky  Procedure(s) Performed: Procedure(s) (LRB): PERCUTANEOUS ENDOSCOPIC GASTROSTOMY (PEG) PLACEMENT (N/A)  Patient location during evaluation: Endoscopy Anesthesia Type: General Level of consciousness: awake and alert Pain management: pain level controlled Vital Signs Assessment: post-procedure vital signs reviewed and stable Respiratory status: spontaneous breathing, nonlabored ventilation and respiratory function stable Cardiovascular status: blood pressure returned to baseline and stable Postop Assessment: no signs of nausea or vomiting Anesthetic complications: no    Last Vitals:  Vitals:   05/18/16 0840 05/18/16 0850  BP: 131/84 127/82  Pulse: 82 86  Resp: 20 18  Temp:      Last Pain:  Vitals:   05/18/16 0840  TempSrc:   PainSc: Asleep                 Ryland Tungate

## 2016-05-18 NOTE — Transfer of Care (Signed)
Immediate Anesthesia Transfer of Care Note  Patient: Francisco [name]  Procedure(s) Performed: Procedure(s): PERCUTANEOUS ENDOSCOPIC GASTROSTOMY (PEG) PLACEMENT (N/A)  Patient Location: PACU  Anesthesia Type:General  Level of Consciousness: awake  Airway & Oxygen Therapy: Patient Spontanous Breathing and Patient connected to nasal cannula oxygen  Post-op Assessment: Report given to RN and Post -op Vital signs reviewed and stable  Post vital signs: Reviewed and stable  Last Vitals:  Vitals:   05/18/16 0727 05/18/16 0821  BP: 127/81 123/84  Pulse: 95 (!) 103  Resp: 16 (!) 23  Temp: (!) 35.8 C (!) 35.9 C    Last Pain:  Vitals:   05/18/16 0821  TempSrc: Temporal  PainSc:          Complications: No apparent anesthesia complications

## 2016-05-19 ENCOUNTER — Encounter: Payer: Self-pay | Admitting: Unknown Physician Specialty

## 2016-05-19 DIAGNOSIS — R1310 Dysphagia, unspecified: Secondary | ICD-10-CM | POA: Diagnosis not present

## 2016-05-19 LAB — GLUCOSE, CAPILLARY
GLUCOSE-CAPILLARY: 83 mg/dL (ref 65–99)
GLUCOSE-CAPILLARY: 112 mg/dL — AB (ref 65–99)
GLUCOSE-CAPILLARY: 128 mg/dL — AB (ref 65–99)
Glucose-Capillary: 80 mg/dL (ref 65–99)
Glucose-Capillary: 86 mg/dL (ref 65–99)

## 2016-05-19 LAB — POTASSIUM: Potassium: 3.1 mmol/L — ABNORMAL LOW (ref 3.5–5.1)

## 2016-05-19 LAB — MAGNESIUM: MAGNESIUM: 1.9 mg/dL (ref 1.7–2.4)

## 2016-05-19 LAB — PHOSPHORUS: Phosphorus: 2.7 mg/dL (ref 2.5–4.6)

## 2016-05-19 MED ORDER — FREE WATER
100.0000 mL | Freq: Four times a day (QID) | Status: DC
  Administered 2016-05-19 – 2016-05-21 (×9): 100 mL

## 2016-05-19 MED ORDER — POTASSIUM CHLORIDE 20 MEQ PO PACK
40.0000 meq | PACK | Freq: Two times a day (BID) | ORAL | Status: AC
  Administered 2016-05-19 (×2): 40 meq
  Filled 2016-05-19 (×2): qty 2

## 2016-05-19 MED ORDER — POTASSIUM CHLORIDE 20 MEQ PO PACK: 40.0000 meq | PACK | Freq: Two times a day (BID) | ORAL | Status: DC

## 2016-05-19 MED ORDER — ZOLPIDEM TARTRATE 5 MG PO TABS
5.0000 mg | ORAL_TABLET | Freq: Every evening | ORAL | Status: DC | PRN
  Filled 2016-05-19: qty 1

## 2016-05-19 NOTE — Progress Notes (Signed)
Physical Therapy Treatment Patient Details Name: Francisco [name] MRN: [id number] DOB: 07/15/1945 Today's Date: 05/19/2016    History of Present Illness Pt is a 70 y.o. male with a known history of Multiple myeloma, dysphagia and recent pneumonia. Patient was home and basically bedbound recently secondary to weakness. A direct admission was set up by Dr. Burlene Arnt for PEG tube placement by Dr. Vira Agar. Patient with recent hospitalizations and was started on TPN at home and finished and IV antibiotic course of antibiotics for pneumonia. On the transportation here he was sitting up for an hour and he was a little bit more short of breath than usual and his heart rate was found to be high on presentation. When lying and flat his heart rate and respiratory status improved and he was feeling better. Pt is s/p PEG placement.     PT Comments    Pt able to perform bed mobility with mod to max assist x1 and sat on edge of bed with min to mod assist x1 for sitting balance (d/t posterior lean).  Pt quickly fatigued with activity requiring increased assist and pacing.  Pt would benefit from discharge to STR to increased strength, activity tolerance, and improve functional mobility.   Follow Up Recommendations  SNF     Equipment Recommendations   (TBD)    Recommendations for Other Services       Precautions / Restrictions Precautions Precautions: Fall Precaution Comments: PEG tube; HOB > 30 degrees Restrictions Weight Bearing Restrictions: No    Mobility  Bed Mobility Overal bed mobility: Needs Assistance Bed Mobility: Rolling;Sidelying to Sit;Sit to Sidelying Rolling: Min assist;Mod assist (logrolling to R and to L side) Sidelying to sit: Mod assist;Max assist;HOB elevated     Sit to sidelying: Mod assist;Max assist;HOB elevated General bed mobility comments: assist for trunk and B LE's; vc's for technique required; logrolling technique used  Transfers                 General  transfer comment: Deferred d/t pt's weakness/fatigue with activity  Ambulation/Gait                 Stairs            Wheelchair Mobility    Modified Rankin (Stroke Patients Only)       Balance Overall balance assessment: Needs assistance Sitting-balance support: Bilateral upper extremity supported;Feet supported Sitting balance-Leahy Scale: Poor Sitting balance - Comments: pt requiring min to mod assist to maintain sitting balance d/t posterior lean; vc's given for shifting weight and hand placement (with feet supported on floor) and pt would be able to briefly hold position before starting to leaning backwards again (performed multiple repetitions of this) Postural control: Posterior lean                          Cognition Arousal/Alertness: Lethargic Behavior During Therapy: Flat affect Overall Cognitive Status:  (Oriented to person)                      Exercises      General Comments General comments (skin integrity, edema, etc.): Pt's son present during session.      Pertinent Vitals/Pain Pain Assessment: 0-10 Pain Score: 4  Pain Location: chronic back pain Pain Descriptors / Indicators: Sore;Tender Pain Intervention(s): Limited activity within patient's tolerance;Monitored during session;Repositioned  See flow sheet for HR and O2 vitals.    Home Living  Prior Function            PT Goals (current goals can now be found in the care plan section) Acute Rehab PT Goals Patient Stated Goal: go home PT Goal Formulation: With patient/family Time For Goal Achievement: 05/31/16 Potential to Achieve Goals: Fair Progress towards PT goals: Progressing toward goals    Frequency    Min 2X/week      PT Plan Frequency needs to be updated    Co-evaluation             End of Session   Activity Tolerance: Patient limited by fatigue Patient left: in bed;with call bell/phone within reach;with bed  alarm set;with family/visitor present     Time: 1145-1210 PT Time Calculation (min) (ACUTE ONLY): 25 min  Charges:  $Therapeutic Activity: 23-37 mins                    G CodesLeitha Bleak May 29, 2016, 4:18 PM Leitha Bleak, Ernest

## 2016-05-19 NOTE — Progress Notes (Signed)
Nutrition Follow-up  DOCUMENTATION CODES:   Severe malnutrition in context of chronic illness  INTERVENTION:  1. Tubefeeding started this morning, Jevitry 1.2 via PEG @ 16m/hr, advance by 10 every 12 hours to goal rate of 526mhr, 100cc free water Q6H --> provides 1440 calories, 66gm protein, and 1372cc H2O daily.  2. Monitor magnesium, potassium, and phosphorus daily, MD replete as necessary until stable and patient TF at goal. At high risk for refeeding, will require aggressive repletion of electrolytes daily.  3. Premier Protein Shake Q24H per MD, provides 160 calories and 30 gm protein  4.. Monitor for tolerance, PO intake of liquids on Clear Liquid Diet,    NUTRITION DIAGNOSIS:   Malnutrition (Severe) related to chronic illness, cancer and cancer related treatments as evidenced by severe depletion of muscle mass, severe depletion of body fat, percent weight loss. -ongoing  GOAL:   Patient will meet greater than or equal to 90% of their needs -progressing with tf  MONITOR:   PO intake, Supplement acceptance, Labs, Weight trends, TF tolerance, I & O's, Skin  REASON FOR ASSESSMENT:   Malnutrition Screening Tool, Consult, Other (Comment) (Low BMI) Enteral/tube feeding initiation and management  ASSESSMENT:   89.o. male with a known history of Multiple myeloma, dysphagia and recent pneumonia. Patient is home and basically bedbound at this point secondary to weakness. A direct admission was set up by Dr. BrBurlene Arntor PEG tube placement by Dr. ElVira AgarPatient with recent hospitalizations and was started on TPN at home and finished and IV antibiotic course of antibiotics for pneumonia.  Patient's tubefeed was started 15-20 mins prior to my visit. Sleeping, tolerating thus far. Will continue to follow daily. D/C tomorrow if stable per MD. Labs and medications reviewed: K 3.1, Mg and Phos WNL so far  Diet Order:  Diet clear liquid Room service appropriate? Yes; Fluid  consistency: Thin  Skin:  Wound (see comment) (Stg II to lower back)  Last BM:  05/16/2016  Height:   Ht Readings from Last 1 Encounters:  05/18/16 _0  (1.676 m)    Weight:   Wt Readings from Last 1 Encounters:  05/19/16 94 lb (42.6 kg)    Ideal Body Weight:  64.54 kg  BMI:  Body mass index is 15.17 kg/m.  Estimated Nutritional Needs:   Kcal:  1325-1500 (33-37 kcal/day)  Protein:  60-72 grams (1.5-1.8 grams/kg)  Fluid:  >/= 1.3 L/day  EDUCATION NEEDS:   Education needs addressed (Reviewed what TF is with family and answered questions.)  WiSatira AnisWard, MS, RD LDN Inpatient Clinical Dietitian Pager [phone]

## 2016-05-19 NOTE — Progress Notes (Signed)
[facility] NAME: Otis Portal    MR#:  [id number]  DATE OF BIRTH:  Mar 12, 1946  SUBJECTIVE:  Cam e in after having ongoing dysphagia and severe weight loss Appears very frail and weak  REVIEW OF SYSTEMS:   Review of Systems  Constitutional: Negative for chills, fever and weight loss.  HENT: Negative for ear discharge, ear pain and nosebleeds.   Eyes: Negative for blurred vision, pain and discharge.  Respiratory: Negative for sputum production, shortness of breath, wheezing and stridor.   Cardiovascular: Negative for chest pain, palpitations, orthopnea and PND.  Gastrointestinal: Positive for abdominal pain. Negative for diarrhea, nausea and vomiting.  Genitourinary: Negative for frequency and urgency.  Musculoskeletal: Negative for back pain and joint pain.  Neurological: Positive for weakness. Negative for sensory change, speech change and focal weakness.  Psychiatric/Behavioral: Negative for depression and hallucinations. The patient is not nervous/anxious.    Tolerating Diet:cld Tolerating PT: home with home health  DRUG ALLERGIES:  No Known Allergies  VITALS:  Blood pressure 123/75, pulse 74, temperature 98 F (36.7 C), temperature source Oral, resp. rate 17, height 5' 6"  (1.676 m), weight 42.6 kg (94 lb), SpO2 99 %.  PHYSICAL EXAMINATION:   Physical Exam  GENERAL:  70 y.o.-year-old patient lying in the bed with no acute distress. Thin cachectic EYES: Pupils equal, round, reactive to light and accommodation. No scleral icterus. Extraocular muscles intact.  HEENT: Head atraumatic, normocephalic. Oropharynx and nasopharynx clear.  NECK:  Supple, no jugular venous distention. No thyroid enlargement, no tenderness.  LUNGS: Normal breath sounds bilaterally, no wheezing, rales, rhonchi. No use of accessory muscles of respiration.  CARDIOVASCULAR: S1, S2 normal. No murmurs, rubs, or gallops.  ABDOMEN: Soft,  nontender, nondistended. Bowel sounds present. No organomegaly or mass. PEG++ EXTREMITIES: No cyanosis, clubbing or edema b/l.    NEUROLOGIC: Cranial nerves II through XII are intact. No focal Motor or sensory deficits b/l.   PSYCHIATRIC:  patient is alert and oriented x 3.  SKIN: No obvious rash, lesion, or ulcer.   LABORATORY PANEL:  CBC  Recent Labs Lab 05/18/16 0555  WBC 6.7  HGB 9.4*  HCT 27.8*  PLT 239    Chemistries   Recent Labs Lab 05/17/16 1143 05/18/16 0555 05/19/16 0630  NA 138 140  --   K 3.3* 3.2* 3.1*  CL 106 110  --   CO2 26 23  --   GLUCOSE 101* 83  --   BUN 24* 26*  --   CREATININE 0.61 0.58*  --   CALCIUM 8.4* 8.0*  --   MG 2.1 1.8 1.9  AST 29  --   --   ALT 30  --   --   ALKPHOS 232*  --   --   BILITOT 0.6  --   --    Cardiac Enzymes No results for input(s): TROPONINI in the last 168 hours. RADIOLOGY:  Dg Chest 1 View  Result Date: 05/17/2016 CLINICAL DATA:  70 year old male with increased shortness of breath and a past history of cancer. EXAM: CHEST 1 VIEW COMPARISON:  Prior chest x-ray 05/10/2016; prior CT scan of the chest 04/26/2016 FINDINGS: Stable cardiomegaly and mediastinal contours. Atherosclerotic calcifications again noted in the transverse aorta. Right upper extremity PICC with the catheter tip overlying the mid SVC. Persistent patchy left lower lobe airspace opacity. The right lung base appears clear. Background hyperinflation and chronic bronchitic changes suggesting COPD. No acute osseous abnormality. Similar appearance  of expansile lytic lesion the medial aspect of the left ninth rib. IMPRESSION: 1. Persistent left lower lobe patchy airspace opacity may be slightly improved compared to 05/10/2016. Differential considerations include residual pneumonia, pleural parenchymal scarring in a region of resolving pneumonia, or less likely malignancy. 2. Similar appearance of expansile lesion in the posteromedial aspect of the left ninth rib  consistent with patient's known multiple myeloma. 3. Stable position of right upper extremity approach PICC. 4. Stable cardiomegaly. 5.  Aortic Atherosclerosis (ICD10-170.0) Electronically Signed   By: Jacqulynn Cadet M.D.   On: 05/17/2016 16:06   ASSESSMENT AND PLAN:   Mihailo Sage  is a 70 y.o. male with a known history of Multiple myeloma, dysphagia and recent pneumonia. Patient is home and basically bedbound at this point secondary to weakness. A direct admission was set up by Dr. Burlene Arnt for PEG tube placement by Dr. Vira Agar. Patient with recent hospitalizations and was started on TPN at home and finished and IV antibiotic course of antibiotics for pneumonia  1. Difficulty swallowing and sever protein calorie malnutrition - Patient is on clear liquid diet at home.  --appreciate Dr Tiffany Kocher and dr Jamal Collin for PEG placement - tube feeding will be started today.  2. Multiple myeloma. Follow up with oncology as outpatient.  3. Weakness and not walking at home physical therapy evaluation.  4. Recent thrush  Pt just finished Diflucan.  5. Recent pneumonia. Repeat chest x-ray. Patient finished IV antibiotics.  6. Hiccups when necessary Thorazine.  D/w son in the room.PT recommends rehabilitation Nadara Mustard male wants to take patient home. Once patient tolerates feedings he'll be discharged tomorrow to home with home health  Case discussed with Care Management/Social Worker. Management plans discussed with the patient, family and they are in agreement.  CODE STATUS: FULL DVT Prophylaxis: lovenox  TOTAL TIME TAKING CARE OF THIS PATIENT: 30 minutes.  >50% time spent on counselling and coordination of care  POSSIBLE D/C IN 1-2 DAYS, DEPENDING ON CLINICAL CONDITION.  Note: This dictation was prepared with Dragon dictation along with smaller phrase technology. Any transcriptional errors that result from this process are unintentional.  Nishita Isaacks M.D on 05/19/2016 at 1:18  PM  Between 7am to 6pm - Pager - [phone]  After 6pm go to [email or web address] - password [email or web address] [facility]  Office  [phone]  CC: Primary care physician; Alfonse Flavors, MD

## 2016-05-19 NOTE — Progress Notes (Signed)
Patient ID: Francisco [name], male   DOB: 08/24/1945, 70 y.o.   MRN: [id number] AVSS. Pt appears comfortable. Abdomen is soft, non distended. PEG intact. May start tube feeding. Stitches and retaining bars need to be removed in about 2 weeks- home nursing can do this.

## 2016-05-20 ENCOUNTER — Telehealth: Payer: Self-pay | Admitting: Internal Medicine

## 2016-05-20 DIAGNOSIS — R1310 Dysphagia, unspecified: Secondary | ICD-10-CM | POA: Diagnosis not present

## 2016-05-20 LAB — GLUCOSE, CAPILLARY
GLUCOSE-CAPILLARY: 145 mg/dL — AB (ref 65–99)
GLUCOSE-CAPILLARY: 151 mg/dL — AB (ref 65–99)
GLUCOSE-CAPILLARY: 126 mg/dL — AB (ref 65–99)
GLUCOSE-CAPILLARY: 142 mg/dL — AB (ref 65–99)
Glucose-Capillary: 135 mg/dL — ABNORMAL HIGH (ref 65–99)
Glucose-Capillary: 143 mg/dL — ABNORMAL HIGH (ref 65–99)

## 2016-05-20 LAB — BASIC METABOLIC PANEL
ANION GAP: 4 — AB (ref 5–15)
BUN: 22 mg/dL — ABNORMAL HIGH (ref 6–20)
CALCIUM: 8.1 mg/dL — AB (ref 8.9–10.3)
CHLORIDE: 116 mmol/L — AB (ref 101–111)
CO2: 24 mmol/L (ref 22–32)
Creatinine, Ser: 0.59 mg/dL — ABNORMAL LOW (ref 0.61–1.24)
GFR calc non Af Amer: >60 mL/min (ref >60)
GLUCOSE: 154 mg/dL — AB (ref 65–99)
POTASSIUM: 3.7 mmol/L (ref 3.5–5.1)
Sodium: 144 mmol/L (ref 135–145)

## 2016-05-20 MED ORDER — JEVITY 1.2 CAL PO LIQD: 1000.0000 mL | ORAL | 0 refills | Status: DC

## 2016-05-20 MED ORDER — PANTOPRAZOLE SODIUM 40 MG PO TBEC: 40.0000 mg | DELAYED_RELEASE_TABLET | Freq: Two times a day (BID) | ORAL | 1 refills | Status: DC

## 2016-05-20 MED ORDER — PANTOPRAZOLE SODIUM 40 MG PO PACK: 40.0000 mg | PACK | Freq: Two times a day (BID) | ORAL | 2 refills | Status: DC

## 2016-05-20 MED ORDER — MORPHINE SULFATE (PF) 4 MG/ML IV SOLN
2.0000 mg | Freq: Once | INTRAVENOUS | Status: AC
  Administered 2016-05-20: 2 mg via INTRAVENOUS
  Filled 2016-05-20: qty 1

## 2016-05-20 MED ORDER — CITALOPRAM HYDROBROMIDE 20 MG PO TABS: 20.0000 mg | ORAL_TABLET | Freq: Every day | ORAL | 1 refills | Status: DC

## 2016-05-20 MED ORDER — PANTOPRAZOLE SODIUM 40 MG PO PACK
40.0000 mg | PACK | Freq: Two times a day (BID) | ORAL | Status: DC
  Administered 2016-05-20 – 2016-05-21 (×3): 40 mg
  Filled 2016-05-20 (×5): qty 20

## 2016-05-20 MED ORDER — DIPHENHYDRAMINE HCL 25 MG PO CAPS: 25.0000 mg | ORAL_CAPSULE | Freq: Every evening | ORAL | 0 refills | Status: DC | PRN

## 2016-05-20 MED ORDER — FREE WATER: 100.0000 mL | Freq: Four times a day (QID) | 1 refills | Status: DC

## 2016-05-20 NOTE — Progress Notes (Signed)
While rounding, Goulds made initial visit to room 106. Pt was being attended to by nurse. Pt was in obvious pain as he was moaning and crying. Pt son and a friend were bedside and welcomed prayer for ease of pain. [place] provided the ministry of presence and prayer. CH is available for follow up as needed.    05/20/16 1200  Clinical Encounter Type  Visited With Patient;Patient and family together  Visit Type Initial;Spiritual support  Referral From Nurse  Spiritual Encounters  Spiritual Needs Prayer;Emotional  Stress Factors  Patient Stress Factors Exhausted;Health changes

## 2016-05-20 NOTE — Clinical Social Work Placement (Signed)
   CLINICAL SOCIAL WORK PLACEMENT  NOTE  Date:  05/20/2016  Patient Details  Name: Francisco [name] MRN: [id number] Date of Birth: 1945-12-20  Clinical Social Work is seeking post-discharge placement for this patient at the [place] level of care (*CSW will initial, date and re-position this form in  chart as items are completed):  Yes   Patient/family provided with [place] Work Department's list of facilities offering this level of care within the geographic area requested by the patient (or if unable, by the patient's family).  Yes   Patient/family informed of their freedom to choose among providers that offer the needed level of care, that participate in Medicare, Medicaid or managed care program needed by the patient, have an available bed and are willing to accept the patient.  Yes   Patient/family informed of [place]'s ownership interest in [facility] and [facility], as well as of the fact that they are under no obligation to receive care at these facilities.  PASRR submitted to EDS on 05/20/16     PASRR number received on 05/20/16     Existing PASRR number confirmed on       FL2 transmitted to all facilities in geographic area requested by pt/family on 05/20/16     FL2 transmitted to all facilities within larger geographic area on       Patient informed that his/her managed care company has contracts with or will negotiate with certain facilities, including the following:        Yes   Patient/family informed of bed offers received.  Patient chooses bed at [facility]     Physician recommends and patient chooses bed at      Patient to be transferred to [facility] on  .  Patient to be transferred to facility by       Patient family notified on   of transfer.  Name of family member notified:        PHYSICIAN Please sign FL2     Additional Comment:     _______________________________________________ Francisco [name] 05/20/2016, 5:06 PM

## 2016-05-20 NOTE — Clinical Social Work Note (Signed)
Clinical Social Work Assessment  Patient Details  Name: Francisco [name] MRN: [id number] Date of Birth: 11-02-1945  Date of referral:  05/20/16               Reason for consult:  Facility Placement                Permission sought to share information with:  Facility Sport and exercise psychologist, Family Supports Permission granted to share information::  Yes, Verbal Permission Granted  Name::     Francisco [name] patient's son [phone]  Agency::  SNF admissions  Relationship::     Contact Information:     Housing/Transportation Living arrangements for the past 2 months:  Single Family Home Source of Information:  Adult Children Patient Interpreter Needed:  None Criminal Activity/Legal Involvement Pertinent to Current Situation/Hospitalization:  No - Comment as needed Significant Relationships:  Adult Children Lives with:  Other (Comment) (Patient lives at a [place]) Do you feel safe going back to the place where you live?  No Need for family participation in patient care:  Yes (Comment) (Patient was lethargic and not feeling well)  Care giving concerns:  Patient needs some short term rehab before he is able to return back home.   Social Worker assessment / plan:  Patient is a 70 year old male who lives in [place].  Patient was sleeping during assessment, however patient's son was at bedside, MSW completed assessment with patient's son.  Patient's son reports that patient has not been to rehab before, MSW explained to patient's son what to expect at rehab and explained that since patient only has Medicaid, they will have to pay privately for SNF placement.  MSW asked patient's son if they have applied for Medicare, and patient's son said they have and it will be effective July 11, 2016.  Patient's son was explained what the process is for having patient get a bed at SNF.  Patient's son did not have any other questions or concerns.  Employment status:  Retired Radiation protection practitioner:  Medicaid In [place] PT Recommendations:  [place] / Referral to community resources:  [place]  Patient/Family's Response to care:  Patient's son in agreement to having patient go to SNF for short term rehab.  Patient/Family's Understanding of and Emotional Response to Diagnosis, Current Treatment, and Prognosis:  Patient's family are optimistic that patient will get stronger.  Emotional Assessment Appearance:  Appears stated age Attitude/Demeanor/Rapport:  Unable to Assess Affect (typically observed):    Orientation:  Oriented to Self Alcohol / Substance use:  Not Applicable Psych involvement (Current and /or in the community):  No (Comment)  Discharge Needs  Concerns to be addressed:  Lack of Support Readmission within the last 30 days:  No Current discharge risk:  Lack of support system Barriers to Discharge:  Inadequate or no insurance   Francisco [name] 05/20/2016, 5:00 PM

## 2016-05-20 NOTE — Progress Notes (Addendum)
Nutrition Follow-up  DOCUMENTATION CODES:   Severe malnutrition in context of chronic illness  INTERVENTION:  -Continue titration of Jevity 1.2 to goal rate of 65ml/hr, currently at 72ml/hr.  Spoke with son at bedside regarding tube feeding education.  Goal at SNF would be to transition patient off continuous feeding to bolus feeding pending pt status and poc at rehab.  Son verbalized understanding. -May need to consider adding bowel regimen due to lack of BM since 11/6  NUTRITION DIAGNOSIS:   Malnutrition (Severe) related to chronic illness, cancer and cancer related treatments as evidenced by severe depletion of muscle mass, severe depletion of body fat, percent weight loss.  ongoing  GOAL:   Patient will meet greater than or equal to 90% of their needs  Improving with titrating tube feeding to goal  MONITOR:   PO intake, Supplement acceptance, Labs, Weight trends, TF tolerance, I & O's, Skin  REASON FOR ASSESSMENT:   Malnutrition Screening Tool, Consult, Other (Comment) (Low BMI) Enteral/tube feeding initiation and management  ASSESSMENT:   Son reports patient complains of pain at the insertion site of PEG placement. Tolerating tube feeding well.   Son reports pt is taking few sips of liquids  Labs reviewed: BUN 22, creatinine WDL, Calcium 8.1, glucose 154  Diet Order:  Diet clear liquid Room service appropriate? Yes; Fluid consistency: Thin  Skin:  Wound (see comment) (Stg II to lower back)  Last BM:  05/16/2016  Height:   Ht Readings from Last 1 Encounters:  05/18/16 5\' 6"  (1.676 m)    Weight:   Wt Readings from Last 1 Encounters:  05/20/16 88 lb (39.9 kg)    Ideal Body Weight:  64.54 kg  BMI:  Body mass index is 14.2 kg/m.  Estimated Nutritional Needs:   Kcal:  1325-1500 (33-37 kcal/day)  Protein:  60-72 grams (1.5-1.8 grams/kg)  Fluid:  >/= 1.3 L/day  EDUCATION NEEDS:   Education needs addressed (Reviewed what TF is with family and answered  questions.)  Tabia Landowski B. Zenia Resides, [place], [place] (pager)

## 2016-05-20 NOTE — Telephone Encounter (Signed)
Reviewed the clinical status of the patient's son in detail. Currently awaiting placement.   Recommend follow-up in [place] on 11/14;labs/velcade.

## 2016-05-20 NOTE — Discharge Instructions (Signed)
Dietitian to follow Cont tube feeding as instructed and gen nursing care for PEG tube

## 2016-05-20 NOTE — Progress Notes (Signed)
Pt has [place] medicaid only. Spoke with son about SNF and issues with medicaid only. He voiced understanding and ok to take his father home. CSW informed

## 2016-05-20 NOTE — Clinical Social Work Note (Addendum)
MSW spoke to patient's son and explained that patient can still go to a SNF but will have to pay privately.  Patient's son stated they are interested in paying privately and would like MSW to begin bed search for patient.  12:30pm  MSW spoke to patient's son [name] [phone] and presented bed offers.  Patient's son is trying to decide between [facility] and [facility].  Patient's son will contact MSW with choice, MSW informed patient's son that patient will be ready for discharge today.  MSW continuing to follow patient's progress throughout discharge planning, and will facilitate discharge planning to SNF if patient's son agrees to SNF.  1:30pm  MSW spoke to patient's son and he has agreed to have patient go to [facility] and will private pay for SNF and rehab.  MSW contacted [facility] who said they can accept patient on Saturday.  MSW notified patient's family, physician, and bedside nurse.  MSW to continue to follow patient's progress.  Jones Broom. Yahayra Geis, MSW [phone]  Mon-Fri 8a-4:30p 05/20/2016 9:28 AM

## 2016-05-20 NOTE — NC FL2 (Signed)
[place] LEVEL OF CARE SCREENING TOOL     IDENTIFICATION  Patient Name: Francisco [name] Birthdate: December 13, 1945 Sex: male Admission Date (Current Location): 05/17/2016  [place] and [place] Number:  Selena Lesser [phone] [place] and Address:  [facility], [address], [place], [place] [address]      Provider Number: [id number]  Attending Physician Name and Address:  Fritzi Mandes, MD  Relative Name and Phone Number:  Olga Coaster [phone] or Durenda Age [phone] or [place] Daughter   [phone]     Current Level of Care: Hospital Recommended Level of Care: [place] Prior Approval Number:    Date Approved/Denied:   PASRR Number: [id number]  Discharge Plan: SNF    Current Diagnoses: Patient Active Problem List   Diagnosis Date Noted  . Dysphagia 05/17/2016  . Dyspnea 05/10/2016  . Pressure injury of skin 05/04/2016  . Protein-calorie malnutrition, severe 04/26/2016  . Pancytopenia ([place]) 04/25/2016  . Healthcare-associated pneumonia 04/25/2016  . Elevated transaminase level 04/25/2016  . SIRS (systemic inflammatory response syndrome) ([place]) 04/25/2016  . Anorexia   . Abnormal loss of weight   . Intractable vomiting with nausea 04/06/2016  . Dehydration 04/05/2016  . Multiple myeloma in relapse ([place]) 03/16/2016  . Pernicious anemia 03/16/2016    Orientation RESPIRATION BLADDER Height & Weight     Self, Time, Situation, Place  Normal Incontinent Weight: 88 lb (39.9 kg) Height:  _0  (167.6 cm)  BEHAVIORAL SYMPTOMS/MOOD NEUROLOGICAL BOWEL NUTRITION STATUS      Continent Diet, Feeding tube (feeding supplement (JEVITY 1.2 CAL) liquid 1,000 mL, Liquid Diet )  AMBULATORY STATUS COMMUNICATION OF NEEDS Skin   Extensive Assist Verbally PU Stage and Appropriate Care   PU Stage 2 Dressing:  (PRN)                   Personal Care Assistance Level of Assistance  Bathing,  Feeding, Dressing Bathing Assistance: Limited assistance Feeding assistance: Limited assistance Dressing Assistance: Limited assistance     Functional Limitations Info  Sight, Hearing, Speech Sight Info: Adequate Hearing Info: Adequate Speech Info: Adequate    SPECIAL CARE FACTORS FREQUENCY  PT (By licensed PT)     PT Frequency: 5x a week              Contractures Contractures Info: Not present    Additional Factors Info  Code Status, Allergies, Psychotropic Code Status Info: Full Code Allergies Info: NKA Psychotropic Info: citalopram (CELEXA) tablet 20 mg         Current Medications (05/20/2016):  This is the current hospital active medication list Current Facility-Administered Medications  Medication Dose Route Frequency Provider Last Rate Last Dose  . acetaminophen (TYLENOL) tablet 650 mg  650 mg Oral Q6H PRN Loletha Grayer, MD       Or  . acetaminophen (TYLENOL) suppository 650 mg  650 mg Rectal Q6H PRN Loletha Grayer, MD      . chlorproMAZINE (THORAZINE) 12.5 mg in sodium chloride 0.9 % 25 mL IVPB  12.5 mg Intravenous Q8H PRN Loletha Grayer, MD      . citalopram (CELEXA) tablet 20 mg  20 mg Oral Daily Cammie Sickle, MD   20 mg at 05/19/16 1005  . diphenhydrAMINE (BENADRYL) capsule 25 mg  25 mg Oral QHS PRN Lance Coon, MD   25 mg at 05/18/16 0004  . feeding supplement (JEVITY 1.2 CAL) liquid 1,000 mL  1,000 mL Per Tube Continuous Fritzi Mandes, MD 30 mL/hr at 05/19/16 2230    .  free water 100 mL  100 mL Per Tube Q6H Fritzi Mandes, MD   100 mL at 05/20/16 0500  . ketorolac (TORADOL) 15 MG/ML injection 15 mg  15 mg Intravenous Q8H PRN Fritzi Mandes, MD   15 mg at 05/20/16 0131  . ondansetron (ZOFRAN-ODT) disintegrating tablet 8 mg  8 mg Oral Q8H PRN Loletha Grayer, MD      . pantoprazole sodium (PROTONIX) 40 mg/20 mL oral suspension 40 mg  40 mg Per Tube BID Fritzi Mandes, MD      . protein supplement (PREMIER PROTEIN) liquid  11 oz Oral Q24H Loletha Grayer, MD   11  oz at 05/19/16 1230  . zolpidem (AMBIEN) tablet 5 mg  5 mg Oral QHS PRN Harvie Bridge, DO         Discharge Medications: Please see discharge summary for a list of discharge medications.  Relevant Imaging Results:  Relevant Lab Results:   Additional Information SSN [id number]  [name]

## 2016-05-20 NOTE — Final Progress Note (Signed)
[place] [facility] [place] NAME: Francisco [name]    MR#:  [id number]  DATE OF BIRTH:  March 06, 1946  DATE OF ADMISSION:  05/17/2016 ADMITTING PHYSICIAN: [name], MD  DATE OF DISCHARGE: 05/20/16  PRIMARY CARE PHYSICIAN: Alfonse Flavors, MD    ADMISSION DIAGNOSIS:  Dysphagia FTT DYSPHAGIA malnourishment  DISCHARGE DIAGNOSIS:  Severe Protein calorie Malnutrition due to dysphagia s/p PEG tube l=placement and now on tube feeding Failure to thrive H/o Multiple myeloma SECONDARY DIAGNOSIS:   Past Medical History:  Diagnosis Date  . Anemia   . Cancer (HCC)    Bone metastasis  . Constipation   . Hearing loss   . Hoarse voice quality   . Liver lesion   . Malaria 2003  . Multiple myeloma ([place]) 03/22/2016   Per Dr. Rogue Bussing on his PET order.  . Tuberculosis 1985    HOSPITAL COURSE:  [name] a 70 y.o.malewith a known history of Multiple myeloma, dysphagia and recent pneumonia. Patient is home and basically bedbound at this point secondary to weakness. A direct admission was set up by Dr. Burlene Arnt for PEG tube placement by Dr. Vira Agar. Patient with recent hospitalizations and was started on TPN at home and finished and IV antibiotic course of antibiotics for pneumonia  1. Difficulty swallowing and sever protein calorie malnutrition - Patient is on clear liquid diet at home.  --appreciate Dr Tiffany Kocher and dr Jamal Collin for PEG placement - tube feeding initiated and tolerating well  2. Multiple myeloma. Follow up with oncology as outpatient.  3. Weakness and not walking at home physical therapy evaluation noted. Recommend Rehab. Pt's son wants to give it a try at rehab Will inform CSW  4. Recent thrush  Pt just finished Diflucan.  5. Recent pneumonia. Repeat chest x-ray. Patient finished IV antibiotics.  6. Hiccups when necessary Thorazine.  Long term poor prognosis Will d/c to rehab today  once bed available D/w son in the room.PT recommends rehabilitation Nadara Mustard male wants to take patient home. Once patient tolerates feedings he'll be discharged tomorrow to home with home health  CONSULTS OBTAINED:  Treatment Team:  Francisco Silvas, MD  DRUG ALLERGIES:  No Known Allergies  DISCHARGE MEDICATIONS:   Current Discharge Medication List    START taking these medications   Details  citalopram (CELEXA) 20 MG tablet Take 1 tablet (20 mg total) by mouth daily. Qty: 30 tablet, Refills: 1    diphenhydrAMINE (BENADRYL) 25 mg capsule Take 1 capsule (25 mg total) by mouth at bedtime as needed for sleep. Qty: 30 capsule, Refills: 0    Nutritional Supplements (FEEDING SUPPLEMENT, JEVITY 1.2 CAL,) LIQD Place 1,000 mLs into feeding tube continuous. Qty: 500 mL, Refills: 0    pantoprazole sodium (PROTONIX) 40 mg/20 mL PACK Place 20 mLs (40 mg total) into feeding tube 2 (two) times daily. Qty: 30 each, Refills: 2    Water For Irrigation, Sterile (FREE WATER) SOLN Place 100 mLs into feeding tube every 6 (six) hours. Qty: 500 mL, Refills: 1      CONTINUE these medications which have NOT CHANGED   Details  cholecalciferol (VITAMIN D) 1000 units tablet Take 1,000 Units by mouth daily.    vitamin B-12 (CYANOCOBALAMIN) 1000 MCG tablet Take 1,000 mcg by mouth daily.      STOP taking these medications     fluconazole (DIFLUCAN) 100 MG tablet      protein supplement shake (PREMIER PROTEIN) LIQD      Vitamin D,  Ergocalciferol, (DRISDOL) 50000 units CAPS capsule         If you experience worsening of your admission symptoms, develop shortness of breath, life threatening emergency, suicidal or homicidal thoughts you must seek medical attention immediately by calling 911 or calling your MD immediately  if symptoms less severe.  You Must read complete instructions/literature along with all the possible adverse reactions/side effects for all the Medicines you take and that have  been prescribed to you. Take any new Medicines after you have completely understood and accept all the possible adverse reactions/side effects.   Please note  You were cared for by a hospitalist during your hospital stay. If you have any questions about your discharge medications or the care you received while you were in the hospital after you are discharged, you can call the unit and asked to speak with the hospitalist on call if the hospitalist that took care of you is not available. Once you are discharged, your primary care physician will handle any further medical issues. Please note that NO REFILLS for any discharge medications will be authorized once you are discharged, as it is imperative that you return to your primary care physician (or establish a relationship with a primary care physician if you do not have one) for your aftercare needs so that they can reassess your need for medications and monitor your lab values. Today   SUBJECTIVE   weak  VITAL SIGNS:  Blood pressure 128/81, pulse 82, temperature 98.1 F (36.7 C), temperature source Oral, resp. rate 16, height 5' 6"  (1.676 m), weight 39.9 kg (88 lb), SpO2 100 %.  I/O:   Intake/Output Summary (Last 24 hours) at 05/20/16 0835 Last data filed at 05/20/16 0518  Gross per 24 hour  Intake              817 ml  Output                0 ml  Net              817 ml    PHYSICAL EXAMINATION:  GENERAL:  70 y.o.-year-old patient lying in the bed with no acute distress. cachectic EYES: Pupils equal, round, reactive to light and accommodation. No scleral icterus. Extraocular muscles intact.  HEENT: Head atraumatic, normocephalic. Oropharynx and nasopharynx clear.  NECK:  Supple, no jugular venous distention. No thyroid enlargement, no tenderness.  LUNGS: Normal breath sounds bilaterally, no wheezing, rales,rhonchi or crepitation. No use of accessory muscles of respiration.  CARDIOVASCULAR: S1, S2 normal. No murmurs, rubs, or gallops.   ABDOMEN: Soft, non-tender, non-distended. Bowel sounds present. No organomegaly or mass. PEG+ EXTREMITIES: No pedal edema, cyanosis, or clubbing.  NEUROLOGIC: Cranial nerves II through XII are intact.subjective weakness  Sensation intact. Gait not checked.  PSYCHIATRIC: The patient is alert and oriented x 3.  SKIN: No obvious rash, lesion, or ulcer.   DATA REVIEW:   CBC   Recent Labs Lab 05/18/16 0555  WBC 6.7  HGB 9.4*  HCT 27.8*  PLT 239    Chemistries   Recent Labs Lab 05/17/16 1143  05/19/16 0630 05/20/16 0615  NA 138  < >  --  144  K 3.3*  < > 3.1* 3.7  CL 106  < >  --  116*  CO2 26  < >  --  24  GLUCOSE 101*  < >  --  154*  BUN 24*  < >  --  22*  CREATININE 0.61  < >  --  0.59*  CALCIUM 8.4*  < >  --  8.1*  MG 2.1  < > 1.9  --   AST 29  --   --   --   ALT 30  --   --   --   ALKPHOS 232*  --   --   --   BILITOT 0.6  --   --   --   < > = values in this interval not displayed.  Microbiology Results   No results found for this or any previous visit (from the past 240 hour(s)).  RADIOLOGY:  No results found.   Management plans discussed with the patient, family and they are in agreement.  CODE STATUS:     Code Status Orders        Start     Ordered   05/17/16 1156  Full code  Continuous     05/17/16 1156    Code Status History    Date Active Date Inactive Code Status Order ID Comments User Context   04/25/2016  8:16 PM 05/04/2016  7:59 PM Full Code [id number]  Theodoro Grist, MD ED   04/06/2016  3:39 AM 04/14/2016 12:51 AM Full Code [id number]  Harrie Foreman, MD Inpatient      TOTAL TIME TAKING CARE OF THIS PATIENT: 40 minutes.    Louise Victory M.D on 05/20/2016 at 8:35 AM  Between 7am to 6pm - Pager - [phone] After 6pm go to [email or web address] - password [email or web address] [facility]  Office  [phone]  CC: Primary care physician; Alfonse Flavors, MD

## 2016-05-20 NOTE — Telephone Encounter (Signed)
msg given to [facility] ctr scheduler.

## 2016-05-20 NOTE — Telephone Encounter (Signed)
Make appt for pt in [place] 11/14 in am; with cbc/cmp/velcade- Thx  Cancel appt for 11/13 in [place]. Pt might be discharged to SNF.

## 2016-05-21 DIAGNOSIS — R1310 Dysphagia, unspecified: Secondary | ICD-10-CM | POA: Diagnosis not present

## 2016-05-21 LAB — GLUCOSE, CAPILLARY
GLUCOSE-CAPILLARY: 107 mg/dL — AB (ref 65–99)
GLUCOSE-CAPILLARY: 111 mg/dL — AB (ref 65–99)
GLUCOSE-CAPILLARY: 127 mg/dL — AB (ref 65–99)

## 2016-05-21 MED ORDER — MAGNESIUM HYDROXIDE 400 MG/5ML PO SUSP
5.0000 mL | Freq: Once | ORAL | Status: AC
  Administered 2016-05-21: 13:00:00 5 mL
  Filled 2016-05-21: qty 30

## 2016-05-21 NOTE — Discharge Summary (Signed)
Francisco [name] at [place] NAME: Francisco [name]    MR#:  [id number]  DATE OF BIRTH:  Feb 10, 1946  DATE OF ADMISSION:  05/17/2016 ADMITTING PHYSICIAN: [name], MD  DATE OF DISCHARGE: 05/21/16  PRIMARY CARE PHYSICIAN: Alfonse Flavors, MD    ADMISSION DIAGNOSIS:  Dysphagia FTT DYSPHAGIA malnourishment  DISCHARGE DIAGNOSIS:   Severe Protein Calorie Malnutrition s/p PEG tube placement now with continous feeding Multiple myeloma Failure to Thrive SECONDARY DIAGNOSIS:   Past Medical History:  Diagnosis Date  . Anemia   . Cancer (HCC)    Bone metastasis  . Constipation   . Hearing loss   . Hoarse voice quality   . Liver lesion   . Malaria 2003  . Multiple myeloma ([place]) 03/22/2016   Per Dr. Rogue Bussing on his PET order.  . Tuberculosis 1985    HOSPITAL COURSE:  Francisco [name] a 70 y.o.malewith a known history of Multiple myeloma, dysphagia and recent pneumonia. Patient is home and basically bedbound at this point secondary to weakness. A direct admission was set up by Dr. Burlene Arnt for PEG tube placement by Dr. Vira Agar. Patient with recent hospitalizations and was started on TPN at home and finished and IV antibiotic course of antibiotics for pneumonia  1. Difficulty swallowing and sever protein calorie malnutrition - Patient is on clear liquid diet at home.  --appreciate Dr Tiffany Kocher and dr Jamal Collin for PEG placement - tube feeding initiated and tolerating well  2. Multiple myeloma. Follow up with oncology as outpatient.  3. Weakness and not walking at home physical therapy evaluation noted. Recommend Rehab. Pt's son wants to give it a try at rehab Will inform CSW---to [facility] today  4. Recent thrush Pt just finished Diflucan.  5. Recent pneumonia. Repeat chest x-ray. Patient finished IV antibiotics.  6. Hiccups when necessary Thorazine.  Remove PICC line D/c to rehab today D/w  son  CONSULTS OBTAINED:  Treatment Team:  Manya Silvas, MD  DRUG ALLERGIES:  No Known Allergies  DISCHARGE MEDICATIONS:   Current Discharge Medication List    START taking these medications   Details  citalopram (CELEXA) 20 MG tablet Take 1 tablet (20 mg total) by mouth daily. Qty: 30 tablet, Refills: 1    diphenhydrAMINE (BENADRYL) 25 mg capsule Take 1 capsule (25 mg total) by mouth at bedtime as needed for sleep. Qty: 30 capsule, Refills: 0    Nutritional Supplements (FEEDING SUPPLEMENT, JEVITY 1.2 CAL,) LIQD Place 1,000 mLs into feeding tube continuous. Qty: 500 mL, Refills: 0    pantoprazole sodium (PROTONIX) 40 mg/20 mL PACK Place 20 mLs (40 mg total) into feeding tube 2 (two) times daily. Qty: 30 each, Refills: 2    Water For Irrigation, Sterile (FREE WATER) SOLN Place 100 mLs into feeding tube every 6 (six) hours. Qty: 500 mL, Refills: 1      CONTINUE these medications which have NOT CHANGED   Details  cholecalciferol (VITAMIN D) 1000 units tablet Take 1,000 Units by mouth daily.    vitamin B-12 (CYANOCOBALAMIN) 1000 MCG tablet Take 1,000 mcg by mouth daily.      STOP taking these medications     fluconazole (DIFLUCAN) 100 MG tablet      protein supplement shake (PREMIER PROTEIN) LIQD      Vitamin D, Ergocalciferol, (DRISDOL) 50000 units CAPS capsule         If you experience worsening of your admission symptoms, develop shortness of breath, life threatening emergency, suicidal or homicidal thoughts  you must seek medical attention immediately by calling 911 or calling your MD immediately  if symptoms less severe.  You Must read complete instructions/literature along with all the possible adverse reactions/side effects for all the Medicines you take and that have been prescribed to you. Take any new Medicines after you have completely understood and accept all the possible adverse reactions/side effects.   Please note  You were cared for by a  hospitalist during your hospital stay. If you have any questions about your discharge medications or the care you received while you were in the hospital after you are discharged, you can call the unit and asked to speak with the hospitalist on call if the hospitalist that took care of you is not available. Once you are discharged, your primary care physician will handle any further medical issues. Please note that NO REFILLS for any discharge medications will be authorized once you are discharged, as it is imperative that you return to your primary care physician (or establish a relationship with a primary care physician if you do not have one) for your aftercare needs so that they can reassess your need for medications and monitor your lab values. Today   SUBJECTIVE   No new complaints  VITAL SIGNS:  Blood pressure 136/82, pulse 94, temperature 98 F (36.7 C), temperature source Oral, resp. rate 17, height 5' 6"  (1.676 m), weight 39.3 kg (86 lb 9.6 oz), SpO2 99 %.  I/O:   Intake/Output Summary (Last 24 hours) at 05/21/16 0912 Last data filed at 05/21/16 0100  Gross per 24 hour  Intake              940 ml  Output                0 ml  Net              940 ml    PHYSICAL EXAMINATION:  GENERAL:  70 y.o.-year-old patient lying in the bed with no acute distress. Thin cachectic EYES: Pupils equal, round, reactive to light and accommodation. No scleral icterus. Extraocular muscles intact.  HEENT: Head atraumatic, normocephalic. Oropharynx and nasopharynx clear.  NECK:  Supple, no jugular venous distention. No thyroid enlargement, no tenderness.  LUNGS: Normal breath sounds bilaterally, no wheezing, rales,rhonchi or crepitation. No use of accessory muscles of respiration.  CARDIOVASCULAR: S1, S2 normal. No murmurs, rubs, or gallops.  ABDOMEN: Soft, non-tender, non-distended. Bowel sounds present. No organomegaly or mass. PEG+ EXTREMITIES: No pedal edema, cyanosis, or clubbing.  NEUROLOGIC:  Cranial nerves II through XII are intact. Muscle strength 5/5 in all extremities. Sensation intact. Gait not checked.  PSYCHIATRIC: The patient is alert and oriented x 3.  SKIN: No obvious rash, lesion, or ulcer.   DATA REVIEW:   CBC   Recent Labs Lab 05/18/16 0555  WBC 6.7  HGB 9.4*  HCT 27.8*  PLT 239    Chemistries   Recent Labs Lab 05/17/16 1143  05/19/16 0630 05/20/16 0615  NA 138  < >  --  144  K 3.3*  < > 3.1* 3.7  CL 106  < >  --  116*  CO2 26  < >  --  24  GLUCOSE 101*  < >  --  154*  BUN 24*  < >  --  22*  CREATININE 0.61  < >  --  0.59*  CALCIUM 8.4*  < >  --  8.1*  MG 2.1  < > 1.9  --  AST 29  --   --   --   ALT 30  --   --   --   ALKPHOS 232*  --   --   --   BILITOT 0.6  --   --   --   < > = values in this interval not displayed.  Microbiology Results   No results found for this or any previous visit (from the past 240 hour(s)).  RADIOLOGY:  No results found.   Management plans discussed with the patient, family and they are in agreement.  CODE STATUS:     Code Status Orders        Start     Ordered   05/17/16 1156  Full code  Continuous     05/17/16 1156    Code Status History    Date Active Date Inactive Code Status Order ID Comments User Context   04/25/2016  8:16 PM 05/04/2016  7:59 PM Full Code [id number]  Theodoro Grist, MD ED   04/06/2016  3:39 AM 04/14/2016 12:51 AM Full Code [id number]  Harrie Foreman, MD Inpatient      TOTAL TIME TAKING CARE OF THIS PATIENT: 40 minutes.    Shinika Estelle M.D on 05/21/2016 at 9:12 AM  Between 7am to 6pm - Pager - [phone] After 6pm go to [email or web address] - password [email or web address] [facility]  Office  [phone]  CC: Primary care physician; Alfonse Flavors, MD

## 2016-05-21 NOTE — Clinical Social Work Note (Signed)
Patient to dc to [facility] via non-emergent EMS. Patient's son was at bedside when [place] visited and is aware. The facility is aware. CSW will con't to follow pending additional dc needs.  Santiago Bumpers, MSW, LCSW-A [phone]

## 2016-05-21 NOTE — Progress Notes (Signed)
Attempted to call report to [facility]; nurse unavailable. Left message - awaiting call back. Madlyn Frankel, RN

## 2016-05-21 NOTE — Progress Notes (Signed)
Patient discharged via EMS. Bingham Millette S, RN  

## 2016-05-21 NOTE — Progress Notes (Signed)
EMS called for transport to [facility]. Destinae Neubecker S, RN  

## 2016-05-21 NOTE — Progress Notes (Signed)
Report given to [facility] at [employer]. Madlyn Frankel, RN

## 2016-05-22 ENCOUNTER — Emergency Department
Admission: EM | Admit: 2016-05-22 | Discharge: 2016-05-22 | Disposition: A | Discharge status: Home / Self Care | Payer: Medicare Other | Attending: Emergency Medicine | Admitting: Emergency Medicine

## 2016-05-22 ENCOUNTER — Encounter: Payer: Self-pay | Admitting: Emergency Medicine

## 2016-05-22 DIAGNOSIS — K9423 Gastrostomy malfunction: Secondary | ICD-10-CM

## 2016-05-22 DIAGNOSIS — Z79899 Other long term (current) drug therapy: Secondary | ICD-10-CM | POA: Insufficient documentation

## 2016-05-22 DIAGNOSIS — Z85830 Personal history of malignant neoplasm of bone: Secondary | ICD-10-CM | POA: Insufficient documentation

## 2016-05-22 MED ORDER — CEFAZOLIN IN D5W 1 GM/50ML IV SOLN
1.0000 g | Freq: Once | INTRAVENOUS | Status: AC
  Administered 2016-05-22: 1 g via INTRAVENOUS
  Filled 2016-05-22: qty 50

## 2016-05-22 MED ORDER — MORPHINE SULFATE (PF) 4 MG/ML IV SOLN
2.0000 mg | Freq: Once | INTRAVENOUS | Status: AC
  Administered 2016-05-22: 2 mg via INTRAVENOUS
  Filled 2016-05-22: qty 1

## 2016-05-22 MED ORDER — OXYMETAZOLINE HCL 0.05 % NA SOLN
1.0000 | Freq: Once | NASAL | Status: AC
  Administered 2016-05-22: 1 via NASAL
  Filled 2016-05-22: qty 15

## 2016-05-22 MED ORDER — ACETAMINOPHEN 160 MG/5ML PO SOLN
1000.0000 mg | Freq: Once | ORAL | Status: DC
  Filled 2016-05-22: qty 40

## 2016-05-22 NOTE — ED Provider Notes (Signed)
[facility] Emergency Department Provider Note  ____________________________________________  Time seen: Approximately 7:29 PM  I have reviewed the triage vital signs and the nursing notes.   HISTORY  Chief Complaint Peg tube clogged   HPI Francisco [name] is a 70 y.o. male history of multiple myeloma, dysphasia, and recently placed PEG tube on 05/18/16 who presents for clogged tube. Patient is PEG tube dependent for feeds and medications. He is on continuous tube feeds. Patient denies any abdominal pain, nausea, vomiting, fever.  Past Medical History:  Diagnosis Date  . Anemia   . Cancer (HCC)    Bone metastasis  . Constipation   . Hearing loss   . Hoarse voice quality   . Liver lesion   . Malaria 2003  . Multiple myeloma ([place]) 03/22/2016   Per Dr. Rogue Bussing on his PET order.  . Tuberculosis 1985    Patient Active Problem List   Diagnosis Date Noted  . Dysphagia 05/17/2016  . Dyspnea 05/10/2016  . Pressure injury of skin 05/04/2016  . Protein-calorie malnutrition, severe 04/26/2016  . Pancytopenia ([place]) 04/25/2016  . Healthcare-associated pneumonia 04/25/2016  . Elevated transaminase level 04/25/2016  . SIRS (systemic inflammatory response syndrome) ([place]) 04/25/2016  . Anorexia   . Abnormal loss of weight   . Intractable vomiting with nausea 04/06/2016  . Dehydration 04/05/2016  . Multiple myeloma in relapse ([place]) 03/16/2016  . Pernicious anemia 03/16/2016    Past Surgical History:  Procedure Laterality Date  . PEG PLACEMENT N/A 05/18/2016   Procedure: PERCUTANEOUS ENDOSCOPIC GASTROSTOMY (PEG) PLACEMENT;  Surgeon: Manya Silvas, MD;  Location: [facility] ENDOSCOPY;  Service: Endoscopy;  Laterality: N/A;  . RETINAL DETACHMENT SURGERY  2006    Prior to Admission medications   Medication Sig Start Date End Date Taking? Authorizing Provider  cholecalciferol (VITAMIN D) 1000 units tablet Take 1,000 Units by mouth daily.    Historical  Provider, MD  citalopram (CELEXA) 20 MG tablet Take 1 tablet (20 mg total) by mouth daily. 05/20/16   Fritzi Mandes, MD  diphenhydrAMINE (BENADRYL) 25 mg capsule Take 1 capsule (25 mg total) by mouth at bedtime as needed for sleep. 05/20/16   Fritzi Mandes, MD  Nutritional Supplements (FEEDING SUPPLEMENT, JEVITY 1.2 CAL,) LIQD Place 1,000 mLs into feeding tube continuous. 05/20/16   Fritzi Mandes, MD  pantoprazole sodium (PROTONIX) 40 mg/20 mL PACK Place 20 mLs (40 mg total) into feeding tube 2 (two) times daily. 05/20/16   Fritzi Mandes, MD  vitamin B-12 (CYANOCOBALAMIN) 1000 MCG tablet Take 1,000 mcg by mouth daily.    Historical Provider, MD  Water For Irrigation, Sterile (FREE WATER) SOLN Place 100 mLs into feeding tube every 6 (six) hours. 05/20/16   Fritzi Mandes, MD    Allergies Patient has no known allergies.  Family History  Problem Relation Age of Onset  . Hypertension Mother     Social History Social History  Substance Use Topics  . Smoking status: Never Smoker  . Smokeless tobacco: Never Used  . Alcohol use No    Review of Systems  Constitutional: Negative for fever. Eyes: Negative for visual changes. ENT: Negative for sore throat. Cardiovascular: Negative for chest pain. Respiratory: Negative for shortness of breath. Gastrointestinal: Negative for abdominal pain, vomiting or diarrhea. + clogged peg tube Genitourinary: Negative for dysuria. Musculoskeletal: Negative for back pain. Skin: Negative for rash. Neurological: Negative for headaches, weakness or numbness.  ____________________________________________   PHYSICAL EXAM:  VITAL SIGNS: ED Triage Vitals  Enc Vitals Group  BP 05/22/16 1840 126/80     Pulse Rate 05/22/16 1840 90     Resp 05/22/16 1840 20     Temp 05/22/16 1840 97.9 F (36.6 C)     Temp Source 05/22/16 1840 Oral     SpO2 05/22/16 1840 97 %     Weight 05/22/16 1840 90 lb (40.8 kg)     Height 05/22/16 1840 _0  (1.676 m)     Head Circumference --       [facility] Flow --      Pain Score 05/22/16 1841 0     Pain Loc --      Pain Edu? --      Excl. in [place]? --     Constitutional: Alert and oriented. Well appearing and in no apparent distress. HEENT:      Head: Normocephalic and atraumatic.         Eyes: Conjunctivae are normal. Sclera is non-icteric. EOMI. PERRL      Mouth/Throat: Mucous membranes are moist.       Neck: Supple with no signs of meningismus. Cardiovascular: Regular rate and rhythm. No murmurs, gallops, or rubs. 2+ symmetrical distal pulses are present in all extremities. No JVD. Respiratory: Normal respiratory effort. Lungs are clear to auscultation bilaterally. No wheezes, crackles, or rhonchi.  Gastrointestinal: Soft, non tender, and non distended with positive bowel sounds. No rebound or guarding. PEG tube clogged. Musculoskeletal: Nontender with normal range of motion in all extremities. No edema, cyanosis, or erythema of extremities. Neurologic: Normal speech and language. Face is symmetric. Moving all extremities. No gross focal neurologic deficits are appreciated. Skin: Skin is warm, dry and intact. No rash noted. Psychiatric: Mood and affect are normal. Speech and behavior are normal.  ____________________________________________   LABS (all labs ordered are listed, but only abnormal results are displayed)  Labs Reviewed - No data to display ____________________________________________  EKG  none ____________________________________________  RADIOLOGY  none  ____________________________________________   PROCEDURES  Procedure(s) performed: None Procedures Critical Care performed:  None ____________________________________________   INITIAL IMPRESSION / ASSESSMENT AND PLAN / ED COURSE  70 y.o. male history of multiple myeloma, dysphasia, and recently placed PEG tube on 05/18/16 who presents for clogged PEG tube. I attempted to declog the tube with suction and a [employer] unsuccessfully. I have  discussed the case with Dr. Epimenio Foot, GI on call. He will call to see if GI suite to see if it can be operational this evening for replacement vs NG tube placement and admission until the morning.  Clinical Course as of May 23 2127  Nancy Fetter May 22, 2016  2126 Tube was unclogged by Dr. Epimenio Foot. Flushing with no difficulty. Patient will be discharged [facility] to [facility].    [CV]    Clinical Course User Index [CV] Rudene Re, MD    Pertinent labs & imaging results that were available during my care of the patient were reviewed by me and considered in my medical decision making (see chart for details).    ____________________________________________   FINAL CLINICAL IMPRESSION(S) / ED DIAGNOSES  Final diagnoses:  PEG tube malfunction (HCC)      NEW MEDICATIONS STARTED DURING THIS VISIT:  New Prescriptions   No medications on file     Note:  This document was prepared using Dragon voice recognition software and may include unintentional dictation errors.    Rudene Re, MD 05/22/16 2128

## 2016-05-22 NOTE — ED Notes (Signed)
Attempted to call [facility] for discharge report. No answer.

## 2016-05-22 NOTE — ED Triage Notes (Signed)
Patient comes from [place]. Peg tube clogged.

## 2016-05-22 NOTE — ED Notes (Signed)
Dr. Alfred Levins at bedside to unclog tube.

## 2016-05-22 NOTE — ED Notes (Signed)
Dr. Epimenio Foot able to unclog peg tube.

## 2016-05-22 NOTE — ED Notes (Signed)
Unable to unclog tube at this time. Will consult GI.

## 2016-05-22 NOTE — Consult Note (Signed)
Consultation  Referring Provider:      Primary Care Physician:  Alfonse Flavors, MD Primary Gastroenterologist:  San Jetty  MD    Reason for Consultation:   Clogged PEG tube       Impression / Plan:   Clogged PEG: Unclogged at bedside in ER by using a pediatric forcep to release material in PEG tube. 60 cc syringe of water was introduced into PEG tube with no resistance. Witnessed by ER physician.         HPI:   Francisco [name] is a 70 y.o. male history of multiple myeloma, dysphasia, and recently placed PEG tube on 05/18/16 who presents for clogged tube. Patient is PEG tube dependent for feeds and medications. He is on continuous tube feeds. Patient denies any abdominal pain, nausea, vomiting, fever. Multiple attempts to dislodge PEG tube were unsuccessful by ER physician.   Past Medical History:  Diagnosis Date  . Anemia   . Cancer (HCC)    Bone metastasis  . Constipation   . Hearing loss   . Hoarse voice quality   . Liver lesion   . Malaria 2003  . Multiple myeloma ([place]) 03/22/2016   Per Dr. Rogue Bussing on his PET order.  . Tuberculosis 1985    Past Surgical History:  Procedure Laterality Date  . PEG PLACEMENT N/A 05/18/2016   Procedure: PERCUTANEOUS ENDOSCOPIC GASTROSTOMY (PEG) PLACEMENT;  Surgeon: Manya Silvas, MD;  Location: [facility] ENDOSCOPY;  Service: Endoscopy;  Laterality: N/A;  . RETINAL DETACHMENT SURGERY  2006    Family History  Problem Relation Age of Onset  . Hypertension Mother     Social History  Substance Use Topics  . Smoking status: Never Smoker  . Smokeless tobacco: Never Used  . Alcohol use No    Prior to Admission medications   Medication Sig Start Date End Date Taking? Authorizing Provider  cholecalciferol (VITAMIN D) 1000 units tablet Take 1,000 Units by mouth daily.    Historical Provider, MD  citalopram (CELEXA) 20 MG tablet Take 1 tablet (20 mg total) by mouth daily. 05/20/16   Fritzi Mandes, MD  diphenhydrAMINE (BENADRYL) 25  mg capsule Take 1 capsule (25 mg total) by mouth at bedtime as needed for sleep. 05/20/16   Fritzi Mandes, MD  Nutritional Supplements (FEEDING SUPPLEMENT, JEVITY 1.2 CAL,) LIQD Place 1,000 mLs into feeding tube continuous. 05/20/16   Fritzi Mandes, MD  pantoprazole sodium (PROTONIX) 40 mg/20 mL PACK Place 20 mLs (40 mg total) into feeding tube 2 (two) times daily. 05/20/16   Fritzi Mandes, MD  vitamin B-12 (CYANOCOBALAMIN) 1000 MCG tablet Take 1,000 mcg by mouth daily.    Historical Provider, MD  Water For Irrigation, Sterile (FREE WATER) SOLN Place 100 mLs into feeding tube every 6 (six) hours. 05/20/16   Fritzi Mandes, MD    Current Facility-Administered Medications  Medication Dose Route Frequency Provider Last Rate Last Dose  . acetaminophen (TYLENOL) solution 1,000 mg  1,000 mg Per Tube Once Maine, MD      . morphine 4 MG/ML injection 2 mg  2 mg Intravenous Once Maine, MD      . ondansetron (ZOFRAN-ODT) disintegrating tablet 8 mg  8 mg Oral Q8H PRN Cammie Sickle, MD      . oxymetazoline (AFRIN) 0.05 % nasal spray 1 spray  1 spray Each Nare Once Rudene Re, MD       Current Outpatient Prescriptions  Medication Sig Dispense Refill  . cholecalciferol (VITAMIN D) 1000 units tablet Take  1,000 Units by mouth daily.    . citalopram (CELEXA) 20 MG tablet Take 1 tablet (20 mg total) by mouth daily. 30 tablet 1  . diphenhydrAMINE (BENADRYL) 25 mg capsule Take 1 capsule (25 mg total) by mouth at bedtime as needed for sleep. 30 capsule 0  . Nutritional Supplements (FEEDING SUPPLEMENT, JEVITY 1.2 CAL,) LIQD Place 1,000 mLs into feeding tube continuous. 500 mL 0  . pantoprazole sodium (PROTONIX) 40 mg/20 mL PACK Place 20 mLs (40 mg total) into feeding tube 2 (two) times daily. 30 each 2  . vitamin B-12 (CYANOCOBALAMIN) 1000 MCG tablet Take 1,000 mcg by mouth daily.    . Water For Irrigation, Sterile (FREE WATER) SOLN Place 100 mLs into feeding tube every 6 (six) hours. 500 mL  1    Allergies as of 05/22/2016  . (No Known Allergies)     Review of Systems:    This is positive for those things mentioned in the HPI. All other review of systems are negative.       Physical Exam:  Vital signs in last 24 hours: Temp:  [97.9 F (36.6 C)] 97.9 F (36.6 C) (11/12 1840) Pulse Rate:  [90] 90 (11/12 1840) Resp:  [20] 20 (11/12 1840) BP: (126)/(80) 126/80 (11/12 1840) SpO2:  [97 %] 97 % (11/12 1840) Weight:  [40.8 kg (90 lb)] 40.8 kg (90 lb) (11/12 1840)    General:  Well-developed, well-nourished and in no acute distress Eyes:  anicteric. ENT:   Mouth and posterior pharynx free of lesions.  Neck:   supple w/o thyromegaly or mass.  Lungs: Clear to auscultation bilaterally. Heart:  S1S2, no rubs, murmurs, gallops. Abdomen:  soft, non-tender, no hepatosplenomegaly, hernia, or mass and Bs+.Marland Kitchen Upper mid abdominal PEG tube.  Rectal: Lymph:  no cervical or supraclavicular adenopathy. Extremities:   no edema Skin   no rash. Neuro:  A&O x 3.  Psych:  appropriate mood and  Affect.   Data Reviewed:   LAB RESULTS: No results for input(s): WBC, HGB, HCT, PLT in the last 72 hours. BMET  Recent Labs  05/20/16 0615  NA 144  K 3.7  CL 116*  CO2 24  GLUCOSE 154*  BUN 22*  CREATININE 0.59*  CALCIUM 8.1*   LFT No results for input(s): PROT, ALBUMIN, AST, ALT, ALKPHOS, BILITOT, BILIDIR, IBILI in the last 72 hours. PT/INR No results for input(s): LABPROT, INR in the last 72 hours.  STUDIES: No results found.   PREVIOUS ENDOSCOPIES:               Thanks   LOS: 0 days   San Jetty MD  @  05/22/2016, 9:45 PM

## 2016-05-22 NOTE — ED Notes (Signed)
EMS here to transport patient back to [facility].

## 2016-05-23 ENCOUNTER — Inpatient Hospital Stay: Payer: Medicare Other

## 2016-05-23 ENCOUNTER — Inpatient Hospital Stay: Payer: Medicare Other | Admitting: Internal Medicine

## 2016-05-23 ENCOUNTER — Telehealth: Payer: Self-pay | Admitting: Internal Medicine

## 2016-05-23 ENCOUNTER — Telehealth: Payer: Self-pay | Admitting: *Deleted

## 2016-05-23 LAB — GLUCOSE, CAPILLARY: GLUCOSE-CAPILLARY: 122 mg/dL — AB (ref 65–99)

## 2016-05-23 NOTE — Telephone Encounter (Signed)
Spoke to son/ [phone]; wants to go [facility] with [facility]/ PH: [phone].   Heather- please send orders for home care- PEG/ physical therapy ASAP  Thx

## 2016-05-23 NOTE — Telephone Encounter (Signed)
Insurance will not approve PT.

## 2016-05-23 NOTE — Telephone Encounter (Signed)
[facility] with [facility].  Orders were already faxed by [facility] for d/c by facility md- peg tube feeding continuous. Rate per nurse DeeDee at facility was at 45 ml/hr @ facility discharge.  Spoke with Corene Cornea at [facility]. He acknowledge orders were faxed.  Per Corene Cornea, [employer] will not cover outpatient/home PT. This has been previously screened with insurance for prior approval and was declined.  Dr. Rogue Bussing made aware.

## 2016-05-24 ENCOUNTER — Ambulatory Visit: Payer: Medicaid Other

## 2016-05-24 ENCOUNTER — Inpatient Hospital Stay: Payer: Medicare Other | Attending: Internal Medicine

## 2016-05-24 ENCOUNTER — Inpatient Hospital Stay (HOSPITAL_BASED_OUTPATIENT_CLINIC_OR_DEPARTMENT_OTHER): Payer: Medicare Other | Admitting: Internal Medicine

## 2016-05-24 ENCOUNTER — Inpatient Hospital Stay: Payer: Medicare Other

## 2016-05-24 ENCOUNTER — Ambulatory Visit: Payer: Medicaid Other | Admitting: Internal Medicine

## 2016-05-24 ENCOUNTER — Other Ambulatory Visit: Payer: Medicaid Other

## 2016-05-24 VITALS — BP 120/84 | HR 103 | Temp 98.0°F | Resp 28 | Ht 66.0 in | Wt 85.6 lb

## 2016-05-24 DIAGNOSIS — R627 Adult failure to thrive: Secondary | ICD-10-CM | POA: Diagnosis not present

## 2016-05-24 DIAGNOSIS — Z7982 Long term (current) use of aspirin: Secondary | ICD-10-CM | POA: Diagnosis not present

## 2016-05-24 DIAGNOSIS — I517 Cardiomegaly: Secondary | ICD-10-CM | POA: Insufficient documentation

## 2016-05-24 DIAGNOSIS — Z8613 Personal history of malaria: Secondary | ICD-10-CM

## 2016-05-24 DIAGNOSIS — Z8701 Personal history of pneumonia (recurrent): Secondary | ICD-10-CM | POA: Diagnosis not present

## 2016-05-24 DIAGNOSIS — R531 Weakness: Secondary | ICD-10-CM

## 2016-05-24 DIAGNOSIS — R112 Nausea with vomiting, unspecified: Secondary | ICD-10-CM

## 2016-05-24 DIAGNOSIS — D649 Anemia, unspecified: Secondary | ICD-10-CM | POA: Insufficient documentation

## 2016-05-24 DIAGNOSIS — R5383 Other fatigue: Secondary | ICD-10-CM

## 2016-05-24 DIAGNOSIS — Z8611 Personal history of tuberculosis: Secondary | ICD-10-CM | POA: Insufficient documentation

## 2016-05-24 DIAGNOSIS — E860 Dehydration: Secondary | ICD-10-CM | POA: Insufficient documentation

## 2016-05-24 DIAGNOSIS — R0600 Dyspnea, unspecified: Secondary | ICD-10-CM

## 2016-05-24 DIAGNOSIS — R140 Abdominal distension (gaseous): Secondary | ICD-10-CM | POA: Insufficient documentation

## 2016-05-24 DIAGNOSIS — Z79899 Other long term (current) drug therapy: Secondary | ICD-10-CM | POA: Insufficient documentation

## 2016-05-24 DIAGNOSIS — R000 Tachycardia, unspecified: Secondary | ICD-10-CM | POA: Insufficient documentation

## 2016-05-24 DIAGNOSIS — R197 Diarrhea, unspecified: Secondary | ICD-10-CM | POA: Insufficient documentation

## 2016-05-24 DIAGNOSIS — R918 Other nonspecific abnormal finding of lung field: Secondary | ICD-10-CM | POA: Insufficient documentation

## 2016-05-24 DIAGNOSIS — K2970 Gastritis, unspecified, without bleeding: Secondary | ICD-10-CM

## 2016-05-24 DIAGNOSIS — E871 Hypo-osmolality and hyponatremia: Secondary | ICD-10-CM | POA: Insufficient documentation

## 2016-05-24 DIAGNOSIS — Z931 Gastrostomy status: Secondary | ICD-10-CM | POA: Insufficient documentation

## 2016-05-24 DIAGNOSIS — C9002 Multiple myeloma in relapse: Secondary | ICD-10-CM | POA: Diagnosis not present

## 2016-05-24 DIAGNOSIS — F329 Major depressive disorder, single episode, unspecified: Secondary | ICD-10-CM | POA: Diagnosis not present

## 2016-05-24 DIAGNOSIS — I2510 Atherosclerotic heart disease of native coronary artery without angina pectoris: Secondary | ICD-10-CM | POA: Diagnosis not present

## 2016-05-24 DIAGNOSIS — J90 Pleural effusion, not elsewhere classified: Secondary | ICD-10-CM

## 2016-05-24 DIAGNOSIS — K5900 Constipation, unspecified: Secondary | ICD-10-CM | POA: Diagnosis not present

## 2016-05-24 DIAGNOSIS — R066 Hiccough: Secondary | ICD-10-CM

## 2016-05-24 LAB — COMPREHENSIVE METABOLIC PANEL
ALT: 15 U/L — ABNORMAL LOW (ref 17–63)
ANION GAP: 8 (ref 5–15)
AST: 19 U/L (ref 15–41)
Albumin: 2.6 g/dL — ABNORMAL LOW (ref 3.5–5.0)
Alkaline Phosphatase: 157 U/L — ABNORMAL HIGH (ref 38–126)
BILIRUBIN TOTAL: 0.4 mg/dL (ref 0.3–1.2)
BUN: 23 mg/dL — AB (ref 6–20)
CHLORIDE: 106 mmol/L (ref 101–111)
CO2: 27 mmol/L (ref 22–32)
Calcium: 8.5 mg/dL — ABNORMAL LOW (ref 8.9–10.3)
Creatinine, Ser: 0.63 mg/dL (ref 0.61–1.24)
Glucose, Bld: 137 mg/dL — ABNORMAL HIGH (ref 65–99)
POTASSIUM: 3.9 mmol/L (ref 3.5–5.1)
Sodium: 141 mmol/L (ref 135–145)
TOTAL PROTEIN: 7.2 g/dL (ref 6.5–8.1)

## 2016-05-24 LAB — CBC WITH DIFFERENTIAL/PLATELET
BASOS ABS: 0 10*3/uL (ref 0–0.1)
BASOS PCT: 0 %
EOS PCT: 3 %
Eosinophils Absolute: 0.2 10*3/uL (ref 0–0.7)
HCT: 31.6 % — ABNORMAL LOW (ref 40.0–52.0)
Hemoglobin: 10.4 g/dL — ABNORMAL LOW (ref 13.0–18.0)
LYMPHS PCT: 22 %
Lymphs Abs: 1.7 10*3/uL (ref 1.0–3.6)
MCH: 28.9 pg (ref 26.0–34.0)
MCHC: 33 g/dL (ref 32.0–36.0)
MCV: 87.7 fL (ref 80.0–100.0)
MONO ABS: 0.4 10*3/uL (ref 0.2–1.0)
MONOS PCT: 6 %
Neutro Abs: 5.2 10*3/uL (ref 1.4–6.5)
Neutrophils Relative %: 69 %
PLATELETS: 217 10*3/uL (ref 150–440)
RBC: 3.6 MIL/uL — ABNORMAL LOW (ref 4.40–5.90)
RDW: 17.7 % — AB (ref 11.5–14.5)
WBC: 7.6 10*3/uL (ref 3.8–10.6)

## 2016-05-24 MED ORDER — HYDROCODONE-ACETAMINOPHEN 7.5-325 MG/15ML PO SOLN: 5.0000 mL | Freq: Four times a day (QID) | ORAL | 0 refills | Status: DC | PRN

## 2016-05-24 MED ORDER — ASPIRIN 81 MG PO CHEW: 81.0000 mg | CHEWABLE_TABLET | Freq: Every day | ORAL | 8 refills | Status: DC

## 2016-05-24 MED ORDER — ONDANSETRON 8 MG PO TBDP: 8.0000 mg | ORAL_TABLET | Freq: Three times a day (TID) | ORAL | 0 refills | Status: DC | PRN

## 2016-05-24 MED ORDER — CHOLECALCIFEROL 25 MCG (1000 UT) PO TABS: 1000.0000 [IU] | ORAL_TABLET | Freq: Every day | ORAL | 6 refills | Status: DC

## 2016-05-24 MED ORDER — CITALOPRAM HYDROBROMIDE 20 MG PO TABS: 20.0000 mg | ORAL_TABLET | Freq: Every day | ORAL | 6 refills | Status: DC

## 2016-05-24 MED ORDER — CHLORPROMAZINE HCL 10 MG PO TABS: 10.0000 mg | ORAL_TABLET | Freq: Three times a day (TID) | ORAL | 2 refills | Status: DC | PRN

## 2016-05-24 MED ORDER — SPIROMETER KIT: 1.0000 | PACK | Freq: Once | 0 refills | Status: AC

## 2016-05-24 MED ORDER — PANTOPRAZOLE SODIUM 40 MG PO PACK: 40.0000 mg | PACK | Freq: Two times a day (BID) | ORAL | 6 refills | Status: DC

## 2016-05-24 NOTE — Progress Notes (Signed)
Patient here for follow-up s/p HP visit. Here for velcade inj.  States that this weight on Saturday at [facility] 85.6 lbs.   Pt unable to stand and weigh due to weakness.   Patient had peg tube placed last week.  Was recently evaluated in ED for malfunction of peg tube. Tube was de clogged at bedside by gastric provider/ED provider per chart notation.  Per son, patient has been constantly sleeping pt very lethargic in exam room today. Asked to be laid down for comfort measures. Pt placed in bed in exam room 1 per request.  Pt reports pain at peg tube site rates pain as 2-3. "Pressure pulling sensation."  Took Norco last night night at bedtime. Difficulty swallowing this tablet. Son requesting lqd pain medication to be pushed through peg tube.  Requesting RF on celexa 20 mg tablets daily and protonix lqd 40 mg twice a day to go through peg tube.  Pt c/o nasal congestion. Using Afrin spray twice a day.  Patient is not taking the revlimid at this time. Pt's son brought revlimid dosing with him to the apt.  Patient and her son educated on peg tube function, dressing changes, feeding schedule and medication dosing, fall precautions and dvt prevention.

## 2016-05-24 NOTE — Patient Instructions (Signed)
You may increase your fluid intake in your peg tube.  You may use liquids such as water or gatarade in your peg "feeding tube."  The goal is to increase fluid intake.  Since he is unable to intake fluids by mouth, please provide fluids via feeding tube in addition to the routine continuous.   Continue to flush any crushed medications with water as previously directed by your provider.    Care of a Feeding Tube People who have trouble swallowing or cannot take food or medicine by mouth are sometimes given feeding tubes. A feeding tube can go into the nose and down to the stomach or through the skin in the abdomen and into the stomach or small bowel. Some of the names of these feeding tubes are gastrostomy tubes, PEG lines, nasogastric tubes, and gastrojejunostomy tubes. Supplies needed to care for the tube site:  Clean gloves.  Clean wash cloth, gauze pads, or soft paper towel.  Cotton swabs.  Skin barrier ointment or cream.  Soap and water.  Pre-cut foam pads or gauze (that go around the tube).  Tube tape. Tube site care 1. Have all supplies ready and available. 2. Wash hands well. 3. Put on clean gloves. 4. Remove the soiled foam pad or gauze, if present, that is found under the tube stabilizer. Change the foam pad or gauze daily or when soiled or moist. 5. Check the skin around the tube site for redness, rash, swelling, drainage, or extra tissue growth. If you notice any of these, call your caregiver. 6. Moisten gauze and cotton swabs with water and soap. 7. Wipe the area closest to the tube (right near the stoma) with cotton swabs. Wipe the surrounding skin with moistened gauze. Rinse with water. 8. Dry the skin and stoma site with a dry gauze pad or soft paper towel. Do not use antibiotic ointments at the tube site. 9. If the skin is red, apply a skin barrier cream or ointment (such as petroleum jelly) in a circular motion, using a cotton swab. The cream or ointment will provide a  moisture barrier for the skin and helps with wound healing. 10. Apply a new pre-cut foam pad or gauze around the tube. Secure it with tape around the edges. If no drainage is present, foam pads or gauze may be left off. 11. Use tape or an anchoring device to fasten the feeding tube to the skin for comfort or as directed. Rotate where you tape the tube to avoid skin damage from the adhesive. 12. Position the person in a semi-upright position (30-45 degree angle). 13. Throw away used supplies. 14. Remove gloves. 15. Wash hands. Supplies needed to flush a feeding tube:  Clean gloves.  60 mL syringe (that connects to the feeding tube).  Towel.  Water. Flushing a feeding tube 1. Have all supplies ready and available. 2. Wash hands well. 3. Put on clean gloves. 4. Draw up 30 mL of water in the syringe. 5. Kink the feeding tube while disconnecting it from the feeding-bag tubing or while removing the plug at the end of the tube. Kinking closes the tube and prevents secretions in the tube from spilling out. 6. Insert the tip of the syringe into the end of the feeding tube. Release the kink. Slowly inject the water. 7. If unable to inject the water, the person with the feeding tube should lay on his or her left side. The tip of the tube may be against the stomach wall, blocking fluid flow.  Changing positions may move the tip away from the stomach wall. After repositioning, try injecting the water again.  Do not use a syringe smaller than 60 mL to flush the tubing.  Do not use excessive force to overcome resistance because this could cause the tube to rupture. 8. After injecting the water, remove the syringe. 9. Always flush before giving the first medicine, between medicines, and after the final medicine before starting a feeding. This prevents medicines from clogging the tube.  Do not mix medicines with formula or with other medicines before giving medicines.  Thoroughly flush medicines  through the tube so they do not mix with formula. 10. Throw away used supplies. 11. Remove gloves. 12. Wash hands. This information is not intended to replace advice given to you by your health care provider. Make sure you discuss any questions you have with your health care provider. Document Released: 06/27/2005 Document Revised: 12/09/2015 Document Reviewed: 02/09/2012 Elsevier Interactive Patient Education  2017 [employer]   PEG Tube Home Guide A percutaneous endoscopic gastrostomy (PEG) tube is used to deliver food and fluids directly into the stomach. The tube has a clamp, a cap, and two anchors (bolsters). One bolster keeps the tube from coming out of the stomach. The other bolster holds the tube against the abdomen. You will be taught how to use and adjust your PEG tube before you leave the hospital. You will also be taught how to care for the opening (stoma) in your abdomen. Make sure that you understand:  How to care for your PEG tube.  How to care for your stoma.  How to give yourself feedings and medicines.  When to call your health care provider for help. How do I care for my peg tube? Check your PEG tube every day. Make sure:  It is not too tight. The bolster should rest gently over the stoma.  It is in the right position. There is a mark on the tube that shows when it is in the right position. Adjust the tube if you need to. How do I care for my stoma? Clean your stoma every day. Follow these steps: 1. Wash your hands with soap and water. 2. Check your stoma for redness, leaking, or skin irritation. 3. Wash the stoma gently with warm, soapy water. 4. Rinse the stoma with warm water. 5. Pat the stoma area dry. You may place a gauze pad over the opening between the outer bolster and your stoma. How do I give myself nutritional formula? Your health care provider will give you instructions about:  How much nutrition and fluid you will need for each feeding.  How  often to have a feeding.  Whether to take medicine in the tube by itself or with a feeding. To give yourself a feeding, follow these steps: 1. Lay out all of the equipment that you will need. 2. Make sure that the nutritional formula is at room temperature. 3. Wash your hands with soap and water. 4. Position yourself so that you are upright. You will need to stay upright throughout the feeding and for at least 30 minutes after the feeding. 5. Make sure the syringe plunger is pushed in. Place the tip of the syringe in clear water, and slowly pull the plunger to bring (draw up) the water into the syringe. 6. Remove the clamp and the cap from the PEG tube. 7. Push the water out of the syringe to clean (flush) the tube. 8. If the tube is clear,  draw up the formula into the syringe. Make sure to use the right amount for each feeding and add water if necessary. 9. Slowly push the formula from the syringe through the tube. 10. After the feeding, flush the tube with water. 11. Put the clamp and the cap on the tube. How do I give myself medicine? To give yourself medicine, follow these steps: 1. Lay out all of the equipment that you will need. 2. If your medicine is in tablet form, crush the tablet and dissolve it in water. 3. Wash your hands with soap and water. 4. Position yourself so that you are upright. You will need to stay upright while you give yourself medicine and for at least 30 minutes afterward. 5. Make sure the syringe plunger is pushed in. Place the tip of the syringe in clear water, and slowly pull the plunger to bring (draw up) the water into the syringe. 6. Remove the clamp and the cap from the PEG tube. 7. Push the water out of the syringe to clean (flush) the tube. 8. If the tube is clear, draw up the medicine into the syringe. 9. Slowly push the medicine from the syringe through the tube. 10. Flush the tube with water. 11. Put the clamp and the cap on the tube. You should not  take sustained release (SR) medicines through your tube. If you are unsure if your medicine is a SR medicine, ask your health care provider or pharmacist. Contact a health care provider if:  You have soreness, redness, or irritation around your stoma.  You have abdominal pain or bloating during or after your feedings.  You have nausea, constipation, or diarrhea that will not go away.  You have a fever.  You have problems with your PEG tube. Get help right away if:  Your tube is blocked.  Your tube falls out.  You have pain around your tube.  You are bleeding from your tube.  Your tube is leaking.  You choke or you have trouble breathing during or after a feeding. This information is not intended to replace advice given to you by your health care provider. Make sure you discuss any questions you have with your health care provider. Document Released: 11/11/2014 Document Revised: 11/30/2015 Document Reviewed: 07/02/2014 Elsevier Interactive Patient Education  2017 [employer].      Due to lack of mobility, You are at risk for developing clots.  Please ambulate as much as possible increase your fluid intake to decrease your risk factors for a clot!!   Venous Thromboembolism Prevention Venous thromboembolism (VTE) is a condition in which a blood clot (thrombus) develops in the body. A thrombus usually occurs in a deep vein in the leg or the pelvis (DVT), but it can also occur in the arm. Sometimes, pieces of a thrombus can break off from its original place of development and travel through the bloodstream to other parts of the body. When that happens, the thrombus is called an embolus. An embolus that travels to one or both lungs is called a pulmonary embolism. An embolism can block the blood flow in the blood vessels of other organs as well. VTE is a serious health condition that can cause disability or death. It is very important to get help right away and to not ignore  symptoms. How can a VTE be prevented?  Exercise regularly. Take a brisk 30 minute walk every day. Staying active and moving around can help you to prevent blood clots.  Avoid  sitting or lying in bed for long periods of time without moving your legs. Change your position often, especially during long-distance travel (over 4 hours).  If you are a woman who is over 58 years of age, avoid unnecessary use of medicines that contain estrogen. These include birth control pills and hormone replacement therapy.  Do not smoke, especially if you take estrogen medicines. If you need help quitting, ask your health care provider.  Eat plenty of fruits and vegetables. Ask your health care provider or dietitian if there are foods that you should avoid.  Maintain a weight that is appropriate for your height. Ask your health care provider what weight is healthy for you.  Wear loose-fitting clothing. Avoid constrictive or tight clothing around your legs or waist.  Try not to bump or injure your legs. Avoid crossing your legs when you are sitting.  Do not use pillows under your knees while lying down unless told by your health care provider.  Wear support hose (compression stockings or TED hose) as told by your health care provider Compression stockings increase blood flow in your legs and can help prevent blood clots. Do not let them bunch up when you are wearing them. How can I prevent VTE when I travel? Long-distance travel (over 4 hours) can increase the risk of a VTE. To prevent VTE when traveling:  Exercise your legs every hour by standing, stretching, and bending and straightening your legs. If you are traveling by airplane, train, or bus, walk up and down the aisle as often as possible to get your blood moving. If you are traveling by car, stop and get out of the car every hour to exercise your legs and stretch. Other types of exercise might include:  Keeping your feet flat on the ground and raising  your toes.  Switching from tightening the muscles in your calves and thighs to relaxing those same muscles while you are sitting.  Pointing and flexing your feet at the ankle joints while you are sitting.  Stay well hydrated while traveling. Drink enough water to keep your urine clear or pale yellow.  Avoid drinking alcohol during long travel. Generally, it is not recommended that you take medicines to prevent DVT during routine travel. How can VTE be prevented if I am hospitalized? A VTE may be prevented by taking medicines that are prescribed to prevent blood clots (anticoagulants). You can also help to prevent VTE while in the hospital by taking these actions:  Get out of bed and walk. Ask your health care provider if this is safe for you to do.  Request the use of a sequential compression device (SCD). This is a machine that pumps air into compression sleeves that are wrapped around your legs.  Request the use of compression stockings, which are tight, elastic stockings that apply pressure to the lower legs. Compression stockings are sometimes used with SCDs. How can I prevent VTE after surgery? Understand that there is an increased risk for VTE for the first 4-6 weeks after surgery. During this time:  Avoid long-distance travel (over 4 hours). If you must travel during this time, ask your health care provider about additional preventive actions that you can take. These might include exercising your arms and legs every hour while you travel.  Avoid sitting or lying still for too long. If possible, get up and walk around one time every hour. Ask your health care provider when this is safe for you to do. Get help right away  if:  You have new or increased pain, swelling, or redness in an arm or leg.  You have numbness or tingling in an arm or leg.  You have shortness of breath while active or at rest.  You have chest pain.  You have a rapid or irregular heartbeat.  You feel  light-headed or dizzy.  You cough up blood.  You notice blood in your vomit, bowel movement, or urine. These symptoms may represent a serious problem that is an emergency. Do not wait to see if the symptoms will go away. Get medical help right away. Call your local emergency services (911 in the [place] Do not drive yourself to the hospital.  This information is not intended to replace advice given to you by your health care provider. Make sure you discuss any questions you have with your health care provider. Document Released: 06/15/2009 Document Revised: 12/03/2015 Document Reviewed: 10/22/2014 Elsevier Interactive Patient Education  2017 [place] Prevention in the Home Introduction Falls can cause injuries. They can happen to people of all ages. There are many things you can do to make your home safe and to help prevent falls. What can I do on the outside of my home?  Regularly fix the edges of walkways and driveways and fix any cracks.  Remove anything that might make you trip as you walk through a door, such as a raised step or threshold.  Trim any bushes or trees on the path to your home.  Use bright outdoor lighting.  Clear any walking paths of anything that might make someone trip, such as rocks or tools.  Regularly check to see if handrails are loose or broken. Make sure that both sides of any steps have handrails.  Any raised decks and porches should have guardrails on the edges.  Have any leaves, snow, or ice cleared regularly.  Use sand or salt on walking paths during winter.  Clean up any spills in your garage right away. This includes oil or grease spills. What can I do in the bathroom?  Use night lights.  Install grab bars by the toilet and in the tub and shower. Do not use towel bars as grab bars.  Use non-skid mats or decals in the tub or shower.  If you need to sit down in the shower, use a plastic, non-slip stool.  Keep the floor dry.  Clean up any water that spills on the floor as soon as it happens.  Remove soap buildup in the tub or shower regularly.  Attach bath mats securely with double-sided non-slip rug tape.  Do not have throw rugs and other things on the floor that can make you trip. What can I do in the bedroom?  Use night lights.  Make sure that you have a light by your bed that is easy to reach.  Do not use any sheets or blankets that are too big for your bed. They should not hang down onto the floor.  Have a firm chair that has side arms. You can use this for support while you get dressed.  Do not have throw rugs and other things on the floor that can make you trip. What can I do in the kitchen?  Clean up any spills right away.  Avoid walking on wet floors.  Keep items that you use a lot in easy-to-reach places.  If you need to reach something above you, use a strong step stool that has a grab  bar.  Keep electrical cords out of the way.  Do not use floor polish or wax that makes floors slippery. If you must use wax, use non-skid floor wax.  Do not have throw rugs and other things on the floor that can make you trip. What can I do with my stairs?  Do not leave any items on the stairs.  Make sure that there are handrails on both sides of the stairs and use them. Fix handrails that are broken or loose. Make sure that handrails are as long as the stairways.  Check any carpeting to make sure that it is firmly attached to the stairs. Fix any carpet that is loose or worn.  Avoid having throw rugs at the top or bottom of the stairs. If you do have throw rugs, attach them to the floor with carpet tape.  Make sure that you have a light switch at the top of the stairs and the bottom of the stairs. If you do not have them, ask someone to add them for you. What else can I do to help prevent falls?  Wear shoes that:  Do not have high heels.  Have rubber bottoms.  Are comfortable and fit you  well.  Are closed at the toe. Do not wear sandals.  If you use a stepladder:  Make sure that it is fully opened. Do not climb a closed stepladder.  Make sure that both sides of the stepladder are locked into place.  Ask someone to hold it for you, if possible.  Clearly mark and make sure that you can see:  Any grab bars or handrails.  First and last steps.  Where the edge of each step is.  Use tools that help you move around (mobility aids) if they are needed. These include:  Canes.  Walkers.  Scooters.  Crutches.  Turn on the lights when you go into a dark area. Replace any light bulbs as soon as they burn out.  Set up your furniture so you have a clear path. Avoid moving your furniture around.  If any of your floors are uneven, fix them.  If there are any pets around you, be aware of where they are.  Review your medicines with your doctor. Some medicines can make you feel dizzy. This can increase your chance of falling. Ask your doctor what other things that you can do to help prevent falls. This information is not intended to replace advice given to you by your health care provider. Make sure you discuss any questions you have with your health care provider. Document Released: 04/23/2009 Document Revised: 12/03/2015 Document Reviewed: 08/01/2014  2017 Elsevier

## 2016-05-24 NOTE — Assessment & Plan Note (Signed)
Multiple myeloma M protein- 3.1 g/dL; kappa/lambda ratio 140; multiple bone lesions. Status post Velcade/Revlimid- multiple interruptions because of hospitalizations poor tolerance. Most recent- M protein 0.8 g/dL; K/L= 8.2.  # Recommend starting Revlimid 5 mg once a day 2 weeks on and one-week off. Discontinue Velcade. Recommend baby aspirin once a day- to prevent blood clots.  # Recent Aspiration Pneumonia- CXR- improved. Status post IV antibiotics. Clinically improved. 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 Increase it to goal at 50 mL per hour.  # Hypocalcemia- hold zometa; cont Vit D  # Anemia hemoglobin 10.5 improving/Stable.   # Debility- recommend physical therapy at home.   # Depression-continue Celexa  # B12 sublingual 1000 g at least 3 times a week  # Gastritis- Protonix liquid through the PEG tube  # Follow-up with me on the in 2 week/labs. Above plan of care was discussed with the patient's son in detail. I went over the medication list at length the patient's son in detail.  # 40 minutes face-to-face with the patient discussing the above plan of care; more than 50% of time spent on prognosis/ natural history; counseling and coordination.

## 2016-05-24 NOTE — Progress Notes (Signed)
[place] NOTE  Patient Care Team: Alfonse Flavors, MD as PCP - General (Family Medicine)  Ms. Francisco [name]; [facility]  CHIEF COMPLAINTS/PURPOSE OF CONSULTATION:  Multiple lytic lesions  #  Oncology History   # SEP 2017- Multiple Myeloma lytic lesions- Thoracic spine/ vertebral compression fractures; IgG Kappa- 3.1gm/dl; K/L-140. Vel-Rev-   # FTT s/p PEG [Dr. Elliot];   # Abnormal CXR;? 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  male  With New diagnosis of multiple myeloma IgG kappa- multiple bone lesions/anemia  Is here for Cycle #2 of Velcade.   In the interim patient was admitted to the hospital for PEG tube placement; and then discharged to rehabilitation. She was in the emergency room recently because of PEG tube malfunction. He is currently home. Plan to have physical therapy at home.  As per the son patient is able to swallow thick fluids; most of the nutrition is through PEG tube. Continues to have generalized weakness unable to ambulate.  No fever no chills. Occasional pain.  Constipation improved. No diarrhea. No nausea vomiting.  ROS: A complete 10 point review of system is done which is negative except mentioned above in history of present illness  MEDICAL HISTORY:  Past Medical History:  Diagnosis Date  . Anemia   . Cancer (HCC)    Bone metastasis  . Constipation   . Hearing loss   . Hoarse voice quality   . Liver lesion   . Malaria 2003  . Multiple myeloma ([place]) 03/22/2016   Per Dr. Rogue Bussing on his PET order.  . Tuberculosis 1985    SURGICAL HISTORY: Past Surgical History:  Procedure Laterality Date  . PEG PLACEMENT N/A 05/18/2016   Procedure: PERCUTANEOUS ENDOSCOPIC GASTROSTOMY (PEG) PLACEMENT;  Surgeon: Manya Silvas, MD;  Location: [facility] ENDOSCOPY;  Service: Endoscopy;  Laterality: N/A;  . 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 No smoking or alcohol.  Social History   Social History  . Marital status: Married    Spouse name: N/A  . Number of children: N/A  . Years of education: N/A   Occupational History  . Not on file.   Social History Main Topics  . Smoking status: Never Smoker  . Smokeless tobacco: Never Used  . Alcohol use No  . Drug use: No  . Sexual activity: Not on file   Other Topics Concern  . Not on file   Social History Narrative  . No narrative on file    FAMILY HISTORY: no history of cancers in the family. Family History  Problem Relation Age of Onset  . Hypertension Mother     ALLERGIES:  has No Known Allergies.  MEDICATIONS:  Current Outpatient Prescriptions  Medication Sig Dispense Refill  . diphenhydrAMINE (BENADRYL) 25 mg capsule Take 1 capsule (25 mg total) by mouth at bedtime as needed for sleep. 30 capsule 0  . HYDROcodone-acetaminophen (NORCO/VICODIN) 5-325 MG tablet Take 1 tablet by mouth every 6 (six) hours as needed for moderate pain.    . Nutritional Supplements (FEEDING SUPPLEMENT, JEVITY 1.2 CAL,) LIQD Place 1,000 mLs into feeding tube continuous. 500 mL 0  . oxymetazoline (AFRIN) 0.05 % nasal spray Place 1 spray into both nostrils 2 (two) times daily.    . pantoprazole sodium (PROTONIX) 40 mg/20 mL PACK Place 20 mLs (40  mg total) into feeding tube 2 (two) times daily. 60 mL 6  . vitamin B-12 (CYANOCOBALAMIN) 1000 MCG tablet Take 1,000 mcg by mouth daily.    . Water For Irrigation, Sterile (FREE WATER) SOLN Place 100 mLs into feeding tube every 6 (six) hours. 500 mL 1  . aspirin 81 MG chewable tablet Chew 1 tablet (81 mg total) by mouth daily. 30 tablet 8  . chlorproMAZINE (THORAZINE) 10 MG tablet Take 1 tablet (10 mg total) by mouth 3 (three) times daily as needed for hiccoughs. 30 tablet 2  . Cholecalciferol (PA VITAMIN D-3) 1000 units tablet Take 1 tablet (1,000 Units  total) by mouth daily. 30 tablet 6  . cholecalciferol (VITAMIN D) 1000 units tablet Take 1,000 Units by mouth daily.    . citalopram (CELEXA) 20 MG tablet Take 1 tablet (20 mg total) by mouth daily. 30 tablet 6  . HYDROcodone-acetaminophen (HYCET) 7.5-325 mg/15 ml solution Place 5 mLs into feeding tube every 6 (six) hours as needed for moderate pain or severe pain. 120 mL 0  . ondansetron (ZOFRAN ODT) 8 MG disintegrating tablet Take 1 tablet (8 mg total) by mouth every 8 (eight) hours as needed for nausea or vomiting. 20 tablet 0  . Respiratory Therapy Supplies (SPIROMETER) KIT 1 each by Does not apply route once. 1 kit 0   Current Facility-Administered Medications  Medication Dose Route Frequency Provider Last Rate Last Dose  . ondansetron (ZOFRAN-ODT) disintegrating tablet 8 mg  8 mg Oral Q8H PRN Cammie Sickle, MD          .  PHYSICAL EXAMINATION: ECOG PERFORMANCE STATUS: 1 - Symptomatic but completely ambulatory  Vitals:   05/24/16 1400  BP: 120/84  Pulse: (!) 103  Resp: (!) 28  Temp: 98 F (36.7 C)   Filed Weights   05/24/16 1402  Weight: 85 lb 9.6 oz (38.8 kg)    GENERAL: Thin built Cachectic-appearing male patient; Alert, no distress and comfortable.   With family. In a wheelchair/resting on the stretcher.  EYES: no pallor or icterus OROPHARYNX: positive for thrush.  NECK: supple, no masses felt LYMPH:  no palpable lymphadenopathy in the cervical, axillary or inguinal regions LUNGS: clear to auscultation and  No wheeze or crackles HEART/CVS: regular rate & rhythm and no murmurs; No lower extremity edema ABDOMEN: abdomen soft, non-tender and normal bowel sounds Musculoskeletal:no cyanosis of digits and no clubbing  PSYCH: alert & oriented x 3 with fluent speech NEURO: no focal motor/sensory deficits SKIN:  no rashes or significant lesions  LABORATORY DATA:  I have reviewed the data as listed Lab Results  Component Value Date   WBC 7.6 05/24/2016   HGB 10.4  (L) 05/24/2016   HCT 31.6 (L) 05/24/2016   MCV 87.7 05/24/2016   PLT 217 05/24/2016    Recent Labs  05/10/16 1000 05/17/16 1143 05/18/16 0555 05/19/16 0630 05/20/16 0615 05/24/16 1317  NA 127* 138 140  --  144 141  K 3.3* 3.3* 3.2* 3.1* 3.7 3.9  CL 98* 106 110  --  116* 106  CO2 _0 --  24 27  GLUCOSE 160* 101* 83  --  154* 137*  BUN 20 24* 26*  --  22* 23*  CREATININE 0.50* 0.61 0.58*  --  0.59* 0.63  CALCIUM 7.6* 8.4* 8.0*  --  8.1* 8.5*  GFRNONAA >60 >60 >60  --  >60 >60  GFRAA >60 >60 >60  --  >60 >60  PROT 5.8* 6.8  --   --   --  7.2  ALBUMIN 2.1* 2.4*  --   --   --  2.6*  AST 21 29  --   --   --  19  ALT 41 30  --   --   --  15*  ALKPHOS 199* 232*  --   --   --  157*  BILITOT 0.6 0.6  --   --   --  0.4    RADIOGRAPHIC STUDIES: I have personally reviewed the radiological images as listed and agreed with the findings in the report. Dg Chest 1 View  Result Date: 05/17/2016 CLINICAL DATA:  70 year old male with increased shortness of breath and a past history of cancer. EXAM: CHEST 1 VIEW COMPARISON:  Prior chest x-ray 05/10/2016; prior CT scan of the chest 04/26/2016 FINDINGS: Stable cardiomegaly and mediastinal contours. Atherosclerotic calcifications again noted in the transverse aorta. Right upper extremity PICC with the catheter tip overlying the mid SVC. Persistent patchy left lower lobe airspace opacity. The right lung base appears clear. Background hyperinflation and chronic bronchitic changes suggesting COPD. No acute osseous abnormality. Similar appearance of expansile lytic lesion the medial aspect of the left ninth rib. IMPRESSION: 1. Persistent left lower lobe patchy airspace opacity may be slightly improved compared to 05/10/2016. Differential considerations include residual pneumonia, pleural parenchymal scarring in a region of resolving pneumonia, or less likely malignancy. 2. Similar appearance of expansile lesion in the posteromedial aspect of the left  ninth rib consistent with patient's known multiple myeloma. 3. Stable position of right upper extremity approach PICC. 4. Stable cardiomegaly. 5.  Aortic Atherosclerosis (ICD10-170.0) Electronically Signed   By: Jacqulynn Cadet M.D.   On: 05/17/2016 16:06   Dg Chest 2 View  Result Date: 05/10/2016 CLINICAL DATA:  Follow up of pneumonia. Shortness of breath. Multiple myeloma. EXAM: CHEST  2 VIEW COMPARISON:  05/01/2016 FINDINGS: Right central line tip: SVC. The patient is rotated to the left on today's radiograph, reducing diagnostic sensitivity and specificity. Improved but not completely resolved left lower lobe airspace opacity. Biapical pleuroparenchymal scarring. T9 and T10 compression fractures with sclerosis in the T10 compression fracture and known bony myeloma in the chest. IMPRESSION: 1. Improved but not completely radiographically resolved left lower lobe pneumonia. 2. Continued compression fractures in the lower thoracic spine. Bony manifestations of multiple myeloma. Electronically Signed   By: Van Clines M.D.   On: 05/10/2016 11:48   Dg Chest 2 View  Result Date: 05/01/2016 CLINICAL DATA:  Pneumonia, wheezing EXAM: CHEST  2 VIEW COMPARISON:  04/29/2016 FINDINGS: Cardiomediastinal silhouette is stable. Persistent left base retrocardiac atelectasis or infiltrate. Mild hyperinflation. No pulmonary edema. IMPRESSION: Persistent left base retrocardiac atelectasis or infiltrate. No pulmonary edema. Electronically Signed   By: Lahoma Crocker M.D.   On: 05/01/2016 14:27   Dg Chest 2 View  Result Date: 04/29/2016 CLINICAL DATA:  Cough.  Possible aspiration.  Multiple myeloma. EXAM: CHEST  2 VIEW COMPARISON:  04/25/2016 FINDINGS: Right arm PICC line remains in appropriate position. Heart size is within normal limits. Pulmonary hyperinflation remains stable. No evidence of pneumothorax. Airspace disease with air bronchograms again seen in the medial left lower lobe, suspicious for pneumonia.  There is also mild symmetric bilateral perihilar airspace disease, suspicious for mild pulmonary edema. Small layering posterior pleural effusion noted. Pulmonary hyperinflation again demonstrated. IMPRESSION: Persistent medial left lower lobe airspace disease and tiny posterior pleural effusion, suspicious for pneumonia. Mild symmetric bilateral perihilar airspace opacity, suspicious for pulmonary edema. Stable cardiomegaly and pulmonary hyperinflation. Electronically Signed  By: Earle Gell M.D.   On: 04/29/2016 16:47   Dg Chest 2 View  Result Date: 04/25/2016 CLINICAL DATA:  Shortness of breath after chemo and radiation treatment earlier today. EXAM: CHEST  2 VIEW COMPARISON:  04/11/2016 FINDINGS: Two views study shows cardiomegaly. Interval development of interstitial and central airspace disease, right greater than left. Right-sided central line tip projects at the mid SVC level. Telemetry leads overlie the chest. IMPRESSION: Cardiomegaly with interstitial and asymmetric airspace disease, right greater than left, new in the interval. Asymmetric pulmonary edema or diffuse infection would be considerations. Electronically Signed   By: Misty Stanley M.D.   On: 04/25/2016 17:24   Dg Abd 1 View  Result Date: 04/29/2016 CLINICAL DATA:  Pt has abdominal distention with complaints of some pain. Nonsmoker. Hx of TB. Hx od constipation. Additional history of multiple myeloma for clinical data provided on PET-CT report of 03/29/2016. EXAM: ABDOMEN - 1 VIEW COMPARISON:  None. FINDINGS: Overall bowel gas pattern is nonobstructive. Fairly large amount of gas within the colon. Questionable thickening of the walls of a small bowel loop in the pelvis. No evidence of soft tissue mass or abnormal fluid collection. No evidence of free intraperitoneal air. No pathologic appearing calcifications seen. IMPRESSION: Nonobstructive bowel gas pattern. Fairly large amount of gas within the colon. Questionable thickening of the  walls of a small bowel loop in the pelvis (enteritis? ). Electronically Signed   By: Franki Cabot M.D.   On: 04/29/2016 08:59   Ct Head Wo Contrast  Result Date: 04/25/2016 CLINICAL DATA:  History of multiple myeloma and altered mental status EXAM: CT HEAD WITHOUT CONTRAST TECHNIQUE: Contiguous axial images were obtained from the base of the skull through the vertex without intravenous contrast. COMPARISON:  None FINDINGS: Brain: No evidence of acute infarction, hemorrhage, hydrocephalus, extra-axial collection or mass lesion/mass effect. Mild atrophic changes are noted. Vascular: No hyperdense vessel or unexpected calcification. Skull: Normal. Negative for fracture or focal lesion. Sinuses/Orbits: No acute finding. Other: None. IMPRESSION: Mild atrophic changes.  No acute abnormality is noted. Electronically Signed   By: Inez Catalina M.D.   On: 04/25/2016 18:22   Ct Chest Wo Contrast  Result Date: 04/27/2016 CLINICAL DATA:  New diagnosis of multiple myeloma with bony lesions, on chemotherapy and radiation therapy. Admitted with progressive weakness, shortness of breath, pancytopenia, history TB. EXAM: CT ANGIOGRAPHY CHEST WITH CONTRAST TECHNIQUE: Multidetector CT imaging of the chest was performed using the standard protocol during bolus administration of intravenous contrast. Multiplanar CT image reconstructions and MIPs were obtained to evaluate the vascular anatomy. Additional precontrast images were obtained. CONTRAST:  75 cc Isovue 370 IV. COMPARISON:  PET-CT 03/28/2016, chest radiograph 04/25/2016 FINDINGS: Cardiovascular: Atherosclerotic calcifications aorta without aneurysm or dissection. Pulmonary arteries well opacified and patent. No evidence of pulmonary embolism. No pericardial effusion. Mediastinum/Nodes: Esophagus dilated by air and fluid. No definite thoracic adenopathy. Base of cervical region unremarkable. Lungs/Pleura: Lungs appear emphysematous. Slightly nodular peribronchovascular  infiltrates in both lower lobes with dense consolidation of a large portion of LEFT lower lobe as well. Minimal LEFT upper lobe infiltrate as well. Scattered areas of subpleural thickening and scarring. No definite pleural effusion or pneumothorax. Upper Abdomen: Single low-attenuation lesion centrally in liver 13 x 10 mm diameter image 78, question cyst. Remaining visualized upper abdomen unremarkable. Musculoskeletal: Expansile destructive lesion of the posterior LEFT ninth rib. Diffuse osseous demineralization. Additional scattered areas of cortical thinning and destruction in BILATERAL ribs. Lytic lesion at T3 vertebral body. Compression fractures  at T4, T9, and T10, suspected to be pathologic. Review of the MIP images confirms the above findings. IMPRESSION: No evidence of pulmonary embolism. Aortic atherosclerosis. BILATERAL lower lobe infiltrates LEFT greater than RIGHT. Scattered bone lesions with thoracic spine compression fractures at T4, T9, and T10. Electronically Signed   By: Lavonia Dana M.D.   On: 04/26/2016 18:06   Ct Angio Chest Pe W Or Wo Contrast  Result Date: 04/27/2016 CLINICAL DATA:  New diagnosis of multiple myeloma with bony lesions, on chemotherapy and radiation therapy. Admitted with progressive weakness, shortness of breath, pancytopenia, history TB. EXAM: CT ANGIOGRAPHY CHEST WITH CONTRAST TECHNIQUE: Multidetector CT imaging of the chest was performed using the standard protocol during bolus administration of intravenous contrast. Multiplanar CT image reconstructions and MIPs were obtained to evaluate the vascular anatomy. Additional precontrast images were obtained. CONTRAST:  75 cc Isovue 370 IV. COMPARISON:  PET-CT 03/28/2016, chest radiograph 04/25/2016 FINDINGS: Cardiovascular: Atherosclerotic calcifications aorta without aneurysm or dissection. Pulmonary arteries well opacified and patent. No evidence of pulmonary embolism. No pericardial effusion. Mediastinum/Nodes: Esophagus  dilated by air and fluid. No definite thoracic adenopathy. Base of cervical region unremarkable. Lungs/Pleura: Lungs appear emphysematous. Slightly nodular peribronchovascular infiltrates in both lower lobes with dense consolidation of a large portion of LEFT lower lobe as well. Minimal LEFT upper lobe infiltrate as well. Scattered areas of subpleural thickening and scarring. No definite pleural effusion or pneumothorax. Upper Abdomen: Single low-attenuation lesion centrally in liver 13 x 10 mm diameter image 78, question cyst. Remaining visualized upper abdomen unremarkable. Musculoskeletal: Expansile destructive lesion of the posterior LEFT ninth rib. Diffuse osseous demineralization. Additional scattered areas of cortical thinning and destruction in BILATERAL ribs. Lytic lesion at T3 vertebral body. Compression fractures at T4, T9, and T10, suspected to be pathologic. Review of the MIP images confirms the above findings. IMPRESSION: No evidence of pulmonary embolism. Aortic atherosclerosis. BILATERAL lower lobe infiltrates LEFT greater than RIGHT. Scattered bone lesions with thoracic spine compression fractures at T4, T9, and T10. Electronically Signed   By: Lavonia Dana M.D.   On: 04/26/2016 18:06   Mr Jeri Cos VF Contrast  Result Date: 04/30/2016 CLINICAL DATA:  70 year old male with history of multiple myeloma, malaria and tuberculosis presenting with decreased oral intake and altered mental status. Subsequent encounter. EXAM: MRI HEAD WITHOUT AND WITH CONTRAST TECHNIQUE: Multiplanar, multiecho pulse sequences of the brain and surrounding structures were obtained without and with intravenous contrast. CONTRAST:  61m MULTIHANCE GADOBENATE DIMEGLUMINE 529 MG/ML IV SOLN COMPARISON:  04/25/2016 head CT.  No comparison brain MR. FINDINGS: Exam is slightly motion degraded. Brain: No acute infarct or intracranial hemorrhage. Mild punctate and patchy white matter changes most likely related to result of chronic  microvascular disease. No intracranial mass or abnormal enhancement. Mild global atrophy without hydrocephalus. Vascular: Major intracranial vascular structures are patent. Skull and upper cervical spine: Abnormal bone marrow upper cervical spine with mild loss height of C3. Slightly speckled appearance of portions of the clivus. Findings may reflect changes of infiltration by myeloma as versus result of anemia. Sinuses/Orbits: Post lens replacement without acute orbital abnormality. Minimal mucosal thickening ethmoid sinus air cells. Partial opacification right mastoid air cells. Other: Negative IMPRESSION: Exam is slightly motion degraded. No acute infarct or intracranial hemorrhage. Mild chronic microvascular changes. No intracranial mass or abnormal enhancement. Mild global atrophy without hydrocephalus. Abnormal bone marrow upper cervical spine with mild loss of height of C3. Slightly speckled appearance of portions of the clivus. Findings may reflect changes of  infiltration by myeloma as versus result of anemia. Electronically Signed   By: Genia Del M.D.   On: 04/30/2016 09:44   Results for KODEN, HUNZEKER (MRN [id number]) as of 05/24/2016 14:01  Ref. Range 03/16/2016 14:59 04/27/2016 18:53  Kappa free light chain Latest Ref Range: 3.3 - 19.4 mg/L 1,005.1 (H) 44.4 (H)  Lamda free light chains Latest Ref Range: 5.7 - 26.3 mg/L 7.1 5.4 (L)  Kappa, lamda light chain ratio Latest Ref Range: 0.26 - 1.65  141.56 (H) 8.22 (H)   Results for CLOVIS, MANKINS (MRN [id number]) as of 05/24/2016 14:01  Ref. Range 03/16/2016 14:59 04/27/2016 18:53  M Protein SerPl Elph-Mcnc Latest Ref Range: Not Observed g/dL 3.1 (H) 0.8 (H)   ASSESSMENT & PLAN:   Multiple myeloma in relapse (HCC) Multiple myeloma M protein- 3.1 g/dL; kappa/lambda ratio 140; multiple bone lesions. Status post Velcade/Revlimid- multiple interruptions because of hospitalizations poor tolerance. Most recent- M protein 0.8 g/dL; K/L=  8.2.  # Recommend starting Revlimid 5 mg once a day 2 weeks on and one-week off. Discontinue Velcade. Recommend baby aspirin once a day- to prevent blood clots.  # Recent Aspiration Pneumonia- CXR- improved. Status post IV antibiotics. Clinically improved. 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 Increase it to goal at 50 mL per hour.  # Hypocalcemia- hold zometa; cont Vit D  # Anemia hemoglobin 10.5 improving/Stable.   # Debility- recommend physical therapy at home.   # Depression-continue Celexa  # B12 sublingual 1000 g at least 3 times a week  # Gastritis- Protonix liquid through the PEG tube  # Follow-up with me on the in 2 week/labs. Above plan of care was discussed with the patient's son in detail. I went over the medication list at length the patient's son in detail.  # 40 minutes face-to-face with the patient discussing the above plan of care; more than 50% of time spent on prognosis/ natural history; counseling and coordination.  All questions were answered. The patient knows to call the clinic with any problems, questions or concerns.    Cammie Sickle, MD 05/24/2016 3:27 PM

## 2016-05-25 ENCOUNTER — Inpatient Hospital Stay: Payer: Medicare Other | Admitting: Internal Medicine

## 2016-05-25 ENCOUNTER — Inpatient Hospital Stay: Payer: Medicare Other

## 2016-05-31 ENCOUNTER — Telehealth: Payer: Self-pay | Admitting: Internal Medicine

## 2016-05-31 NOTE — Telephone Encounter (Signed)
Allena Katz said her father has a lot of numbness in his fingers after taking the Revlimid. He had it Sun, Mon, and today and she wants to know if it is okay to skip a day. Please call: [phone]

## 2016-05-31 NOTE — Telephone Encounter (Signed)
FYI- Spoke to patient's daughter- # addressed the concerns regarding tube feeds # mild tingling and numbness-hands continue Revlimid for now.

## 2016-06-07 ENCOUNTER — Inpatient Hospital Stay: Payer: Medicare Other

## 2016-06-07 ENCOUNTER — Inpatient Hospital Stay (HOSPITAL_BASED_OUTPATIENT_CLINIC_OR_DEPARTMENT_OTHER): Payer: Medicare Other | Admitting: Internal Medicine

## 2016-06-07 VITALS — BP 126/87 | HR 124 | Temp 98.9°F | Resp 24 | Ht 66.0 in

## 2016-06-07 DIAGNOSIS — Z8613 Personal history of malaria: Secondary | ICD-10-CM

## 2016-06-07 DIAGNOSIS — R000 Tachycardia, unspecified: Secondary | ICD-10-CM

## 2016-06-07 DIAGNOSIS — R918 Other nonspecific abnormal finding of lung field: Secondary | ICD-10-CM

## 2016-06-07 DIAGNOSIS — R5383 Other fatigue: Secondary | ICD-10-CM

## 2016-06-07 DIAGNOSIS — Z79899 Other long term (current) drug therapy: Secondary | ICD-10-CM

## 2016-06-07 DIAGNOSIS — Z931 Gastrostomy status: Secondary | ICD-10-CM

## 2016-06-07 DIAGNOSIS — R140 Abdominal distension (gaseous): Secondary | ICD-10-CM

## 2016-06-07 DIAGNOSIS — R197 Diarrhea, unspecified: Secondary | ICD-10-CM | POA: Diagnosis not present

## 2016-06-07 DIAGNOSIS — E871 Hypo-osmolality and hyponatremia: Secondary | ICD-10-CM

## 2016-06-07 DIAGNOSIS — Z8701 Personal history of pneumonia (recurrent): Secondary | ICD-10-CM

## 2016-06-07 DIAGNOSIS — I2510 Atherosclerotic heart disease of native coronary artery without angina pectoris: Secondary | ICD-10-CM

## 2016-06-07 DIAGNOSIS — I517 Cardiomegaly: Secondary | ICD-10-CM

## 2016-06-07 DIAGNOSIS — F329 Major depressive disorder, single episode, unspecified: Secondary | ICD-10-CM

## 2016-06-07 DIAGNOSIS — C9002 Multiple myeloma in relapse: Secondary | ICD-10-CM

## 2016-06-07 DIAGNOSIS — E860 Dehydration: Secondary | ICD-10-CM

## 2016-06-07 DIAGNOSIS — D649 Anemia, unspecified: Secondary | ICD-10-CM

## 2016-06-07 DIAGNOSIS — J90 Pleural effusion, not elsewhere classified: Secondary | ICD-10-CM

## 2016-06-07 DIAGNOSIS — K5900 Constipation, unspecified: Secondary | ICD-10-CM

## 2016-06-07 DIAGNOSIS — R627 Adult failure to thrive: Secondary | ICD-10-CM

## 2016-06-07 DIAGNOSIS — Z7982 Long term (current) use of aspirin: Secondary | ICD-10-CM

## 2016-06-07 DIAGNOSIS — K2970 Gastritis, unspecified, without bleeding: Secondary | ICD-10-CM

## 2016-06-07 DIAGNOSIS — R531 Weakness: Secondary | ICD-10-CM

## 2016-06-07 DIAGNOSIS — Z8611 Personal history of tuberculosis: Secondary | ICD-10-CM

## 2016-06-07 LAB — COMPREHENSIVE METABOLIC PANEL
ALBUMIN: 2.7 g/dL — AB (ref 3.5–5.0)
ALT: 21 U/L (ref 17–63)
ANION GAP: 8 (ref 5–15)
AST: 20 U/L (ref 15–41)
Alkaline Phosphatase: 143 U/L — ABNORMAL HIGH (ref 38–126)
BILIRUBIN TOTAL: 0.4 mg/dL (ref 0.3–1.2)
BUN: 16 mg/dL (ref 6–20)
CHLORIDE: 94 mmol/L — AB (ref 101–111)
CO2: 25 mmol/L (ref 22–32)
Calcium: 8.3 mg/dL — ABNORMAL LOW (ref 8.9–10.3)
Creatinine, Ser: 0.49 mg/dL — ABNORMAL LOW (ref 0.61–1.24)
GFR calc Af Amer: >60 mL/min (ref >60)
GFR calc non Af Amer: >60 mL/min (ref >60)
GLUCOSE: 114 mg/dL — AB (ref 65–99)
POTASSIUM: 4 mmol/L (ref 3.5–5.1)
Sodium: 127 mmol/L — ABNORMAL LOW (ref 135–145)
TOTAL PROTEIN: 6.3 g/dL — AB (ref 6.5–8.1)

## 2016-06-07 LAB — CBC WITH DIFFERENTIAL/PLATELET
BASOS PCT: 0 %
Basophils Absolute: 0 10*3/uL (ref 0–0.1)
EOS ABS: 0.1 10*3/uL (ref 0–0.7)
EOS PCT: 1 %
HEMATOCRIT: 31.3 % — AB (ref 40.0–52.0)
Hemoglobin: 10.5 g/dL — ABNORMAL LOW (ref 13.0–18.0)
Lymphocytes Relative: 20 %
Lymphs Abs: 1.5 10*3/uL (ref 1.0–3.6)
MCH: 29 pg (ref 26.0–34.0)
MCHC: 33.4 g/dL (ref 32.0–36.0)
MCV: 86.7 fL (ref 80.0–100.0)
MONO ABS: 0.9 10*3/uL (ref 0.2–1.0)
MONOS PCT: 12 %
NEUTROS ABS: 5 10*3/uL (ref 1.4–6.5)
Neutrophils Relative %: 67 %
PLATELETS: 279 10*3/uL (ref 150–440)
RBC: 3.61 MIL/uL — ABNORMAL LOW (ref 4.40–5.90)
RDW: 17.6 % — AB (ref 11.5–14.5)
WBC: 7.6 10*3/uL (ref 3.8–10.6)

## 2016-06-07 MED ORDER — SODIUM CHLORIDE 0.9 % IV SOLN
INTRAVENOUS | Status: DC
  Administered 2016-06-07: 15:00:00 via INTRAVENOUS
  Filled 2016-06-07 (×2): qty 1000

## 2016-06-07 NOTE — Progress Notes (Signed)
Patient presents to clinic today for follow-up with multiple myeloma. Patient accompanied by son in law today for visit. Pt c/o diarrhea and bowel/bladder incontinence. Reports "diaper rash" which pt is using destine for irritation from loose bowels. Son in law reports an est. 3-4 loose stools per day sometimes more. Pt has not taken any antidiarrheal medications. His son in law brought imodium AD to clinic today to ask if pt can start this antidiarrheal medication. Son in law states that "I feel that the feeding is causing the diarrhea. The rate is now 50 mls/hr."    Pt reports that he has taken 2 weeks of Revlmid 5 mg daily x 2 weeks. No dosing has been missed per pt and patient's family member.  Pt reports the following side effects: diarrhea; dark yellow color urine.

## 2016-06-07 NOTE — Assessment & Plan Note (Addendum)
Multiple myeloma M protein- 3.1 g/dL; kappa/lambda ratio 140; multiple bone lesions. Status post Velcade/Revlimid- multiple interruptions because of hospitalizations poor tolerance. Most recent- M protein 0.8 g/dL; K/L= 8.2. Await M protein from today.  # Recommend starting Revlimid 5 mg once a day 2 weeks on and one-week off. Continue baby aspirin once a day- to prevent blood clots.  # Diarrhea- [last 4 days;] ? Diarrhea; Tube feeds Vs. Revlimid. Vs Others- check C. Difficile/ PCR. Recommend Imodium  # Hyponatremia sodium 127/tachycardic- dehydration recommend IV fluids.  #  Anemia hemoglobin 10.5 improving/Stable.   # Debility- recommend physical therapy at home.   # Depression-recommend Celexa; noncompliant with medication  # B12 sublingual 1000 g at least 3 times a week  # Gastritis- Protonix liquid through the PEG tube; stable  # "Diaper rash"- recommend Vaseline.   # Recommend follow-up in 1 week CBC BMP; restarting Revlimid.

## 2016-06-07 NOTE — Progress Notes (Signed)
[place] NOTE  Patient Care Team: Alfonse Flavors, MD as PCP - General (Family Medicine)  [name]; [facility]  CHIEF COMPLAINTS/PURPOSE OF CONSULTATION:  Multiple lytic lesions  #  Oncology History   # SEP 2017- Multiple Myeloma lytic lesions- Thoracic spine/ vertebral compression fractures; IgG Kappa- 3.1gm/dl; K/L-140. Vel-Rev-   # FTT s/p PEG [Dr. Elliot];   # Abnormal CXR;? 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  male  With New diagnosis of multiple myeloma IgG kappa- multiple bone lesions/anemia Currently on single agent Revlimid is here for follow-up.  Patient status post 14 days of 5 mg of Revlimid; last day was yesterday. Patient noted to have diarrhea 3-4 loose stools a day; patient had not taken any Imodium. Patient continues to use a PEG tube feeding- up to 45 ml/hour.   He denies any pain. Continues to have generalized weakness unable to ambulate. no fever no chills. No nausea vomiting.  ROS: A complete 10 point review of system is done which is negative except mentioned above in history of present illness  MEDICAL HISTORY:  Past Medical History:  Diagnosis Date  . Anemia   . Cancer (HCC)    Bone metastasis  . Constipation   . Hearing loss   . Hoarse voice quality   . Liver lesion   . Malaria 2003  . Multiple myeloma ([place]) 03/22/2016   Per Dr. Rogue Bussing on his PET order.  . Tuberculosis 1985    SURGICAL HISTORY: Past Surgical History:  Procedure Laterality Date  . PEG PLACEMENT N/A 05/18/2016   Procedure: PERCUTANEOUS ENDOSCOPIC GASTROSTOMY (PEG) PLACEMENT;  Surgeon: Manya Silvas, MD;  Location: [facility] ENDOSCOPY;  Service: Endoscopy;  Laterality: N/A;  . 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 No smoking or alcohol.  Social History    Social History  . Marital status: Married    Spouse name: N/A  . Number of children: N/A  . Years of education: N/A   Occupational History  . Not on file.   Social History Main Topics  . Smoking status: Never Smoker  . Smokeless tobacco: Never Used  . Alcohol use No  . Drug use: No  . Sexual activity: Not on file   Other Topics Concern  . Not on file   Social History Narrative  . No narrative on file    FAMILY HISTORY: no history of cancers in the family. Family History  Problem Relation Age of Onset  . Hypertension Mother     ALLERGIES:  has No Known Allergies.  MEDICATIONS:  Current Outpatient Prescriptions  Medication Sig Dispense Refill  . aspirin 81 MG chewable tablet Chew 1 tablet (81 mg total) by mouth daily. 30 tablet 8  . Cholecalciferol (PA VITAMIN D-3) 1000 units tablet Take 1 tablet (1,000 Units total) by mouth daily. 30 tablet 6  . Diaper Rash Products (DESITIN) OINT Apply 1 application topically 3 (three) times daily. Prn diaper rash    . lenalidomide (REVLIMID) 5 MG capsule Take 5 mg by mouth daily. Takes 2 weeks on and 1 week off.    . Nutritional Supplements (FEEDING SUPPLEMENT, OSMOLITE 1.2 CAL,) LIQD Place 237 mLs into feeding tube continuous. Rate 50 ml/hr    . pantoprazole sodium (PROTONIX) 40 mg/20 mL PACK Place 20 mLs (40 mg total) into  feeding tube 2 (two) times daily. 60 mL 6  . vitamin B-12 (CYANOCOBALAMIN) 1000 MCG tablet Take 1,000 mcg by mouth daily.    . Water For Irrigation, Sterile (FREE WATER) SOLN Place 100 mLs into feeding tube every 6 (six) hours. 500 mL 1  . chlorproMAZINE (THORAZINE) 10 MG tablet Take 1 tablet (10 mg total) by mouth 3 (three) times daily as needed for hiccoughs. (Patient not taking: Reported on 06/07/2016) 30 tablet 2  . citalopram (CELEXA) 20 MG tablet Take 1 tablet (20 mg total) by mouth daily. (Patient not taking: Reported on 06/07/2016) 30 tablet 6  . HYDROcodone-acetaminophen (HYCET) 7.5-325 mg/15 ml solution  Place 5 mLs into feeding tube every 6 (six) hours as needed for moderate pain or severe pain. (Patient not taking: Reported on 06/07/2016) 120 mL 0  . Nutritional Supplements (FEEDING SUPPLEMENT, JEVITY 1.2 CAL,) LIQD Place 1,000 mLs into feeding tube continuous. (Patient not taking: Reported on 06/07/2016) 500 mL 0  . ondansetron (ZOFRAN ODT) 8 MG disintegrating tablet Take 1 tablet (8 mg total) by mouth every 8 (eight) hours as needed for nausea or vomiting. (Patient not taking: Reported on 06/07/2016) 20 tablet 0   Current Facility-Administered Medications  Medication Dose Route Frequency Provider Last Rate Last Dose  . ondansetron (ZOFRAN-ODT) disintegrating tablet 8 mg  8 mg Oral Q8H PRN Cammie Sickle, MD       Facility-Administered Medications Ordered in Other Visits  Medication Dose Route Frequency Provider Last Rate Last Dose  . 0.9 %  sodium chloride infusion   Intravenous Continuous Cammie Sickle, MD 500 mL/hr at 06/07/16 1525        .  PHYSICAL EXAMINATION: ECOG PERFORMANCE STATUS: 3 - Symptomatic, >50% confined to bed  Vitals:   06/07/16 1402  BP: 126/87  Pulse: (!) 124  Resp: (!) 24  Temp: 98.9 F (37.2 C)   There were no vitals filed for this visit.  GENERAL: Thin built Cachectic-appearing male patient; Alert, no distress and comfortable.   With family. In a wheelchair.  EYES: no pallor or icterus OROPHARYNX: positive for thrush.  NECK: supple, no masses felt LYMPH:  no palpable lymphadenopathy in the cervical, axillary or inguinal regions LUNGS: clear to auscultation and  No wheeze or crackles HEART/CVS: regular rate & rhythm and no murmurs; No lower extremity edema ABDOMEN: abdomen soft, non-tender and normal bowel sounds Musculoskeletal:no cyanosis of digits and no clubbing  PSYCH: alert & oriented x 3 with fluent speech NEURO: no focal motor/sensory deficits SKIN:  no rashes or significant lesions  LABORATORY DATA:  I have reviewed the data  as listed Lab Results  Component Value Date   WBC 7.6 06/07/2016   HGB 10.5 (L) 06/07/2016   HCT 31.3 (L) 06/07/2016   MCV 86.7 06/07/2016   PLT 279 06/07/2016    Recent Labs  05/17/16 1143  05/20/16 0615 05/24/16 1317 06/07/16 1347  NA 138  < > 144 141 127*  K 3.3*  < > 3.7 3.9 4.0  CL 106  < > 116* 106 94*  CO2 26  < > 24 27 25   GLUCOSE 101*  < > 154* 137* 114*  BUN 24*  < > 22* 23* 16  CREATININE 0.61  < > 0.59* 0.63 0.49*  CALCIUM 8.4*  < > 8.1* 8.5* 8.3*  GFRNONAA >60  < > >60 >60 >60  GFRAA >60  < > >60 >60 >60  PROT 6.8  --   --  7.2 6.3*  ALBUMIN 2.4*  --   --  2.6* 2.7*  AST 29  --   --  19 20  ALT 30  --   --  15* 21  ALKPHOS 232*  --   --  157* 143*  BILITOT 0.6  --   --  0.4 0.4  < > = values in this interval not displayed.  RADIOGRAPHIC STUDIES: I have personally reviewed the radiological images as listed and agreed with the findings in the report. Dg Chest 1 View  Result Date: 05/17/2016 CLINICAL DATA:  70 year old male with increased shortness of breath and a past history of cancer. EXAM: CHEST 1 VIEW COMPARISON:  Prior chest x-ray 05/10/2016; prior CT scan of the chest 04/26/2016 FINDINGS: Stable cardiomegaly and mediastinal contours. Atherosclerotic calcifications again noted in the transverse aorta. Right upper extremity PICC with the catheter tip overlying the mid SVC. Persistent patchy left lower lobe airspace opacity. The right lung base appears clear. Background hyperinflation and chronic bronchitic changes suggesting COPD. No acute osseous abnormality. Similar appearance of expansile lytic lesion the medial aspect of the left ninth rib. IMPRESSION: 1. Persistent left lower lobe patchy airspace opacity may be slightly improved compared to 05/10/2016. Differential considerations include residual pneumonia, pleural parenchymal scarring in a region of resolving pneumonia, or less likely malignancy. 2. Similar appearance of expansile lesion in the posteromedial  aspect of the left ninth rib consistent with patient's known multiple myeloma. 3. Stable position of right upper extremity approach PICC. 4. Stable cardiomegaly. 5.  Aortic Atherosclerosis (ICD10-170.0) Electronically Signed   By: Jacqulynn Cadet M.D.   On: 05/17/2016 16:06   Dg Chest 2 View  Result Date: 05/10/2016 CLINICAL DATA:  Follow up of pneumonia. Shortness of breath. Multiple myeloma. EXAM: CHEST  2 VIEW COMPARISON:  05/01/2016 FINDINGS: Right central line tip: SVC. The patient is rotated to the left on today's radiograph, reducing diagnostic sensitivity and specificity. Improved but not completely resolved left lower lobe airspace opacity. Biapical pleuroparenchymal scarring. T9 and T10 compression fractures with sclerosis in the T10 compression fracture and known bony myeloma in the chest. IMPRESSION: 1. Improved but not completely radiographically resolved left lower lobe pneumonia. 2. Continued compression fractures in the lower thoracic spine. Bony manifestations of multiple myeloma. Electronically Signed   By: Van Clines M.D.   On: 05/10/2016 11:48   Results for GURMAN, ASHLAND (MRN [id number]) as of 05/24/2016 14:01  Ref. Range 03/16/2016 14:59 04/27/2016 18:53  Kappa free light chain Latest Ref Range: 3.3 - 19.4 mg/L 1,005.1 (H) 44.4 (H)  Lamda free light chains Latest Ref Range: 5.7 - 26.3 mg/L 7.1 5.4 (L)  Kappa, lamda light chain ratio Latest Ref Range: 0.26 - 1.65  141.56 (H) 8.22 (H)   Results for GEROME, KOKESH (MRN [id number]) as of 05/24/2016 14:01  Ref. Range 03/16/2016 14:59 04/27/2016 18:53  M Protein SerPl Elph-Mcnc Latest Ref Range: Not Observed g/dL 3.1 (H) 0.8 (H)   ASSESSMENT & PLAN:   Multiple myeloma in relapse (HCC) Multiple myeloma M protein- 3.1 g/dL; kappa/lambda ratio 140; multiple bone lesions. Status post Velcade/Revlimid- multiple interruptions because of hospitalizations poor tolerance. Most recent- M protein 0.8 g/dL; K/L= 8.2. Await M  protein from today.  # Recommend starting Revlimid 5 mg once a day 2 weeks on and one-week off. Continue baby aspirin once a day- to prevent blood clots.  # Diarrhea- [last 4 days;] ? Diarrhea; Tube feeds Vs. Revlimid. Vs Others- check C. Difficile/ PCR. Recommend Imodium  # Hyponatremia sodium 127/tachycardic-  dehydration recommend IV fluids.  #  Anemia hemoglobin 10.5 improving/Stable.   # Debility- recommend physical therapy at home.   # Depression-recommend Celexa; noncompliant with medication  # B12 sublingual 1000 g at least 3 times a week  # Gastritis- Protonix liquid through the PEG tube; stable  # "Diaper rash"- recommend Vaseline.   # Recommend follow-up in 1 week CBC BMP; restarting Revlimid.  All questions were answered. The patient knows to call the clinic with any problems, questions or concerns.    Cammie Sickle, MD 06/07/2016 4:04 PM

## 2016-06-07 NOTE — Progress Notes (Signed)
Nutrition Assessment   Reason for Assessment:   Patient identified on Malnutrition Screening report.  Noted to have new PEG tube.    ASSESSMENT:  Dr. Rogue Bussing agreeable for this writer to reach out to patient's son [name] via phone as will be mostly seen at [facility] clinic.  Called [place] and he is back in [place] but asked that I call his brother in law Manish to discuss tube feeding.    Patient with multiple myeloma.  PEG tube placed for failure to thrive, malnutrition on 05/18/2016. History of anemia, constipation  Brother in law reports patient is tolerating osmolite 1.2 (read name off bottle) at 77m/hr for at least 22 hours per day.  Manish reports at times will reduce the rate of the tube feeding if patient complains of abdominal pain.  Thinks that patient may get slid down in the bed and gets uncomfortable as they do keep patient's head of bed elevated while tube feeding is running.  Manish reports patient has been having diarrhea for the past 4 days, prior to that stool was soft formed.  Has been on osmolite 1.2 for more than 4 days and has been at rate of 50 for more than 4 days.  Manish reports flushes PEG tube with 60-1261mof water 7-8 times per day 420-96051mf water per day pending how much fluids patient consumes during the day.  Manish reports from the chemo class I know that he should be taking at least a liter of water per day.  Typically consumes few sips of water, soup during the day.  Little nutrition is taken orally at this time most of nutrition is coming from PEG tube.  Manish reports urine is yellowish to dark in color vs light yellow.      Medications: reviewed, velcade given on 11/14  Labs: BUN 23, creatinine WDL, glucose 137 (11/14)  Anthropometrics:   Height: 66 inches Weight: 85.6 lb (reported on 11/14) BMI: 13  Noted wt in September 108 pounds (20% weight loss in the last 2 months)   Estimated Energy Needs  Kcals: 1170-1300 calories/d Protein: 45-59  g/d Fluid: > 1.3 L/d  NUTRITION DIAGNOSIS: Inadequate oral intake related to cancer and cancer related treatments as evidenced by PEG tube placed for nutrition and significant weight loss (20% weight loss in 2 months)   INTERVENTION:   Continue osmolite 1.2 at rate of 81m32mr 22 hours (per brother in law turns rate down or off at times). Provides 1320 kcals, 61 g of protein and 902 ml free water.  Recommend at least 120ml10mfree water flush 4 times per day to provide 1382ml 62m water (including water from tube feeding).  Continue to monitor diarrhea sounds like this is an acute issue. May need medication to assist with diarrhea. Spoke with RN, HeatheNira Connding diarrhea, question recent antibiotic use, revlimid side effect??     MONITORING, EVALUATION, GOAL: Patient to tolerate tube feeding to promote weight gain and improved strength   NEXT VISIT: as needed   [place] B. Birdie Beveridge,[facility] [email or web address])

## 2016-06-08 ENCOUNTER — Other Ambulatory Visit: Payer: Self-pay | Admitting: *Deleted

## 2016-06-08 ENCOUNTER — Telehealth: Payer: Self-pay | Admitting: *Deleted

## 2016-06-08 DIAGNOSIS — R197 Diarrhea, unspecified: Secondary | ICD-10-CM

## 2016-06-08 LAB — MULTIPLE MYELOMA PANEL, SERUM
ALPHA 1: 0.3 g/dL (ref 0.0–0.4)
ALPHA2 GLOB SERPL ELPH-MCNC: 0.9 g/dL (ref 0.4–1.0)
Albumin SerPl Elph-Mcnc: 2.7 g/dL — ABNORMAL LOW (ref 2.9–4.4)
Albumin/Glob SerPl: 0.9 (ref 0.7–1.7)
B-Globulin SerPl Elph-Mcnc: 0.8 g/dL (ref 0.7–1.3)
Gamma Glob SerPl Elph-Mcnc: 1.3 g/dL (ref 0.4–1.8)
Globulin, Total: 3.3 g/dL (ref 2.2–3.9)
IGA: 32 mg/dL — AB (ref 61–437)
IGG (IMMUNOGLOBIN G), SERUM: 1390 mg/dL (ref 700–1600)
IGM, SERUM: 15 mg/dL (ref 15–143)
M Protein SerPl Elph-Mcnc: 0.7 g/dL — ABNORMAL HIGH
TOTAL PROTEIN ELP: 6 g/dL (ref 6.0–8.5)

## 2016-06-08 LAB — KAPPA/LAMBDA LIGHT CHAINS
Kappa free light chain: 136.9 mg/L — ABNORMAL HIGH (ref 3.3–19.4)
Kappa, lambda light chain ratio: 5.85 — ABNORMAL HIGH (ref 0.26–1.65)
LAMDA FREE LIGHT CHAINS: 23.4 mg/L (ref 5.7–26.3)

## 2016-06-08 NOTE — Telephone Encounter (Signed)
Home Care nurse called to acquire verbal order for extended home care. Dr. Burlene Arnt agreed with additional care. Jeani Hawking at home care given verbal order.

## 2016-06-09 ENCOUNTER — Ambulatory Visit: Payer: Medicare Other | Admitting: Radiation Oncology

## 2016-06-14 ENCOUNTER — Inpatient Hospital Stay: Payer: Medicare Other | Attending: Internal Medicine

## 2016-06-14 ENCOUNTER — Inpatient Hospital Stay: Payer: Medicare Other

## 2016-06-14 ENCOUNTER — Inpatient Hospital Stay (HOSPITAL_BASED_OUTPATIENT_CLINIC_OR_DEPARTMENT_OTHER): Payer: Medicare Other | Admitting: Internal Medicine

## 2016-06-14 VITALS — BP 133/79 | HR 123 | Temp 98.1°F | Resp 18

## 2016-06-14 DIAGNOSIS — Z8781 Personal history of (healed) traumatic fracture: Secondary | ICD-10-CM

## 2016-06-14 DIAGNOSIS — R490 Dysphonia: Secondary | ICD-10-CM

## 2016-06-14 DIAGNOSIS — K2970 Gastritis, unspecified, without bleeding: Secondary | ICD-10-CM | POA: Diagnosis not present

## 2016-06-14 DIAGNOSIS — Z79899 Other long term (current) drug therapy: Secondary | ICD-10-CM | POA: Insufficient documentation

## 2016-06-14 DIAGNOSIS — D649 Anemia, unspecified: Secondary | ICD-10-CM

## 2016-06-14 DIAGNOSIS — R000 Tachycardia, unspecified: Secondary | ICD-10-CM | POA: Diagnosis not present

## 2016-06-14 DIAGNOSIS — Z931 Gastrostomy status: Secondary | ICD-10-CM

## 2016-06-14 DIAGNOSIS — L22 Diaper dermatitis: Secondary | ICD-10-CM | POA: Diagnosis not present

## 2016-06-14 DIAGNOSIS — I517 Cardiomegaly: Secondary | ICD-10-CM | POA: Diagnosis not present

## 2016-06-14 DIAGNOSIS — I700 Atherosclerosis of aorta: Secondary | ICD-10-CM

## 2016-06-14 DIAGNOSIS — R918 Other nonspecific abnormal finding of lung field: Secondary | ICD-10-CM | POA: Diagnosis not present

## 2016-06-14 DIAGNOSIS — K769 Liver disease, unspecified: Secondary | ICD-10-CM

## 2016-06-14 DIAGNOSIS — K5900 Constipation, unspecified: Secondary | ICD-10-CM | POA: Insufficient documentation

## 2016-06-14 DIAGNOSIS — R627 Adult failure to thrive: Secondary | ICD-10-CM

## 2016-06-14 DIAGNOSIS — E871 Hypo-osmolality and hyponatremia: Secondary | ICD-10-CM | POA: Insufficient documentation

## 2016-06-14 DIAGNOSIS — C9002 Multiple myeloma in relapse: Secondary | ICD-10-CM

## 2016-06-14 DIAGNOSIS — Z7982 Long term (current) use of aspirin: Secondary | ICD-10-CM | POA: Diagnosis not present

## 2016-06-14 DIAGNOSIS — Z8613 Personal history of malaria: Secondary | ICD-10-CM

## 2016-06-14 DIAGNOSIS — Z8611 Personal history of tuberculosis: Secondary | ICD-10-CM | POA: Diagnosis not present

## 2016-06-14 DIAGNOSIS — E876 Hypokalemia: Secondary | ICD-10-CM | POA: Diagnosis not present

## 2016-06-14 DIAGNOSIS — R197 Diarrhea, unspecified: Secondary | ICD-10-CM | POA: Diagnosis not present

## 2016-06-14 DIAGNOSIS — E538 Deficiency of other specified B group vitamins: Secondary | ICD-10-CM | POA: Insufficient documentation

## 2016-06-14 LAB — BASIC METABOLIC PANEL
Anion gap: 9 (ref 5–15)
BUN: 16 mg/dL (ref 6–20)
CALCIUM: 8.3 mg/dL — AB (ref 8.9–10.3)
CHLORIDE: 95 mmol/L — AB (ref 101–111)
CO2: 25 mmol/L (ref 22–32)
CREATININE: 0.55 mg/dL — AB (ref 0.61–1.24)
GFR calc non Af Amer: >60 mL/min (ref >60)
GLUCOSE: 120 mg/dL — AB (ref 65–99)
Potassium: 3.7 mmol/L (ref 3.5–5.1)
Sodium: 129 mmol/L — ABNORMAL LOW (ref 135–145)

## 2016-06-14 LAB — CBC
HCT: 32.6 % — ABNORMAL LOW (ref 40.0–52.0)
HEMOGLOBIN: 10.7 g/dL — AB (ref 13.0–18.0)
MCH: 28.9 pg (ref 26.0–34.0)
MCHC: 33 g/dL (ref 32.0–36.0)
MCV: 87.5 fL (ref 80.0–100.0)
PLATELETS: 205 10*3/uL (ref 150–440)
RBC: 3.72 MIL/uL — AB (ref 4.40–5.90)
RDW: 17.2 % — ABNORMAL HIGH (ref 11.5–14.5)
WBC: 5.9 10*3/uL (ref 3.8–10.6)

## 2016-06-14 NOTE — Progress Notes (Signed)
[place] NOTE  Patient Care Team: Alfonse Flavors, MD as PCP - General (Family Medicine)  [name]; [facility]  CHIEF COMPLAINTS/PURPOSE OF CONSULTATION:  Multiple lytic lesions  #  Oncology History   # SEP 2017- Multiple Myeloma lytic lesions- Thoracic spine/ vertebral compression fractures; IgG Kappa- 3.1gm/dl; K/L-140. Vel-Rev-   # FTT s/p PEG [Dr. Elliot];   # Abnormal CXR;? 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  male  With New diagnosis of multiple myeloma IgG kappa- multiple bone lesions/anemia Currently on single agent Revlimid is here for follow-up. Patient currently is off Revlimid for approximately 1 week.  Diarrhea improved; noted to have constipation for half a pill of Imodium. He started to eat little better. He continues to use PEG tube.  Patient continues to use a PEG tube feeding- up to 45 ml/hour.   He denies any pain. Continues to have generalized weakness; he is starting to walk a bit with physical therapy.. no fever no chills. No nausea vomiting.  ROS: A complete 10 point review of system is done which is negative except mentioned above in history of present illness  MEDICAL HISTORY:  Past Medical History:  Diagnosis Date  . Anemia   . Cancer (HCC)    Bone metastasis  . Constipation   . Hearing loss   . Hoarse voice quality   . Liver lesion   . Malaria 2003  . Multiple myeloma ([place]) 03/22/2016   Per Dr. Rogue Bussing on his PET order.  . Tuberculosis 1985    SURGICAL HISTORY: Past Surgical History:  Procedure Laterality Date  . PEG PLACEMENT N/A 05/18/2016   Procedure: PERCUTANEOUS ENDOSCOPIC GASTROSTOMY (PEG) PLACEMENT;  Surgeon: Manya Silvas, MD;  Location: [facility] ENDOSCOPY;  Service: Endoscopy;  Laterality: N/A;  . 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 No smoking or alcohol.  Social History   Social History  . Marital status: Married    Spouse name: N/A  . Number of children: N/A  . Years of education: N/A   Occupational History  . Not on file.   Social History Main Topics  . Smoking status: Never Smoker  . Smokeless tobacco: Never Used  . Alcohol use No  . Drug use: No  . Sexual activity: Not on file   Other Topics Concern  . Not on file   Social History Narrative  . No narrative on file    FAMILY HISTORY: no history of cancers in the family. Family History  Problem Relation Age of Onset  . Hypertension Mother     ALLERGIES:  has No Known Allergies.  MEDICATIONS:  Current Outpatient Prescriptions  Medication Sig Dispense Refill  . aspirin 81 MG chewable tablet Chew 1 tablet (81 mg total) by mouth daily. 30 tablet 8  . chlorproMAZINE (THORAZINE) 10 MG tablet Take 1 tablet (10 mg total) by mouth 3 (three) times daily as needed for hiccoughs. 30 tablet 2  . Cholecalciferol (PA VITAMIN D-3) 1000 units tablet Take 1 tablet (1,000 Units total) by mouth daily. 30 tablet 6  . citalopram (CELEXA) 20 MG tablet Take 1 tablet (20 mg total) by mouth daily. 30 tablet 6  . Diaper Rash Products (DESITIN) OINT Apply 1 application topically 3 (three) times daily. Prn diaper rash    . HYDROcodone-acetaminophen (HYCET) 7.5-325  mg/15 ml solution Place 5 mLs into feeding tube every 6 (six) hours as needed for moderate pain or severe pain. 120 mL 0  . lenalidomide (REVLIMID) 5 MG capsule Take 5 mg by mouth daily. Takes 2 weeks on and 1 week off.    . Nutritional Supplements (FEEDING SUPPLEMENT, JEVITY 1.2 CAL,) LIQD Place 1,000 mLs into feeding tube continuous. 500 mL 0  . Nutritional Supplements (FEEDING SUPPLEMENT, OSMOLITE 1.2 CAL,) LIQD Place 237 mLs into feeding tube continuous. Rate 50 ml/hr    . pantoprazole sodium (PROTONIX) 40 mg/20 mL PACK Place 20 mLs (40 mg total) into feeding tube 2 (two) times daily.  60 mL 6  . vitamin B-12 (CYANOCOBALAMIN) 1000 MCG tablet Take 1,000 mcg by mouth daily.    . Water For Irrigation, Sterile (FREE WATER) SOLN Place 100 mLs into feeding tube every 6 (six) hours. 500 mL 1  . ondansetron (ZOFRAN ODT) 8 MG disintegrating tablet Take 1 tablet (8 mg total) by mouth every 8 (eight) hours as needed for nausea or vomiting. (Patient not taking: Reported on 06/14/2016) 20 tablet 0   Current Facility-Administered Medications  Medication Dose Route Frequency Provider Last Rate Last Dose  . ondansetron (ZOFRAN-ODT) disintegrating tablet 8 mg  8 mg Oral Q8H PRN Cammie Sickle, MD          .  PHYSICAL EXAMINATION: ECOG PERFORMANCE STATUS: 3 - Symptomatic, >50% confined to bed  Vitals:   06/14/16 0856  BP: 133/79  Pulse: (!) 123  Resp: 18  Temp: 98.1 F (36.7 C)   There were no vitals filed for this visit.  GENERAL: Thin built Cachectic-appearing male patient; Alert, no distress and comfortable.   With family. In a wheelchair.  EYES: no pallor or icterus OROPHARYNX: positive for thrush.  NECK: supple, no masses felt LYMPH:  no palpable lymphadenopathy in the cervical, axillary or inguinal regions LUNGS: clear to auscultation and  No wheeze or crackles HEART/CVS: regular rate & rhythm and no murmurs; No lower extremity edema ABDOMEN: abdomen soft, non-tender and normal bowel sounds Musculoskeletal:no cyanosis of digits and no clubbing  PSYCH: alert & oriented x 3 with fluent speech NEURO: no focal motor/sensory deficits SKIN:  no rashes or significant lesions  LABORATORY DATA:  I have reviewed the data as listed Lab Results  Component Value Date   WBC 5.9 06/14/2016   HGB 10.7 (L) 06/14/2016   HCT 32.6 (L) 06/14/2016   MCV 87.5 06/14/2016   PLT 205 06/14/2016    Recent Labs  05/17/16 1143  05/24/16 1317 06/07/16 1347 06/14/16 0825  NA 138  < > 141 127* 129*  K 3.3*  < > 3.9 4.0 3.7  CL 106  < > 106 94* 95*  CO2 26  < > 27 25 25   GLUCOSE  101*  < > 137* 114* 120*  BUN 24*  < > 23* 16 16  CREATININE 0.61  < > 0.63 0.49* 0.55*  CALCIUM 8.4*  < > 8.5* 8.3* 8.3*  GFRNONAA >60  < > >60 >60 >60  GFRAA >60  < > >60 >60 >60  PROT 6.8  --  7.2 6.3*  --   ALBUMIN 2.4*  --  2.6* 2.7*  --   AST 29  --  19 20  --   ALT 30  --  15* 21  --   ALKPHOS 232*  --  157* 143*  --   BILITOT 0.6  --  0.4 0.4  --   < > =  values in this interval not displayed.  RADIOGRAPHIC STUDIES: I have personally reviewed the radiological images as listed and agreed with the findings in the report. Dg Chest 1 View  Result Date: 05/17/2016 CLINICAL DATA:  70 year old male with increased shortness of breath and a past history of cancer. EXAM: CHEST 1 VIEW COMPARISON:  Prior chest x-ray 05/10/2016; prior CT scan of the chest 04/26/2016 FINDINGS: Stable cardiomegaly and mediastinal contours. Atherosclerotic calcifications again noted in the transverse aorta. Right upper extremity PICC with the catheter tip overlying the mid SVC. Persistent patchy left lower lobe airspace opacity. The right lung base appears clear. Background hyperinflation and chronic bronchitic changes suggesting COPD. No acute osseous abnormality. Similar appearance of expansile lytic lesion the medial aspect of the left ninth rib. IMPRESSION: 1. Persistent left lower lobe patchy airspace opacity may be slightly improved compared to 05/10/2016. Differential considerations include residual pneumonia, pleural parenchymal scarring in a region of resolving pneumonia, or less likely malignancy. 2. Similar appearance of expansile lesion in the posteromedial aspect of the left ninth rib consistent with patient's known multiple myeloma. 3. Stable position of right upper extremity approach PICC. 4. Stable cardiomegaly. 5.  Aortic Atherosclerosis (ICD10-170.0) Electronically Signed   By: Jacqulynn Cadet M.D.   On: 05/17/2016 16:06   Results for Francisco [name], Francisco [name] (MRN [id number]) as of 05/24/2016 14:01  Ref.  Range 03/16/2016 14:59 04/27/2016 18:53  Kappa free light chain Latest Ref Range: 3.3 - 19.4 mg/L 1,005.1 (H) 44.4 (H)  Lamda free light chains Latest Ref Range: 5.7 - 26.3 mg/L 7.1 5.4 (L)  Kappa, lamda light chain ratio Latest Ref Range: 0.26 - 1.65  141.56 (H) 8.22 (H)   Results for Francisco [name], Francisco [name] (MRN [id number]) as of 05/24/2016 14:01  Ref. Range 03/16/2016 14:59 04/27/2016 18:53  M Protein SerPl Elph-Mcnc Latest Ref Range: Not Observed g/dL 3.1 (H) 0.8 (H)   Results for Francisco [name], Francisco [name] (MRN [id number]) as of 06/14/2016 09:05  Ref. Range 05/20/2016 11:27 05/20/2016 16:40 05/20/2016 20:30 05/21/2016 00:26 05/21/2016 04:09 05/21/2016 07:48 05/21/2016 11:44 05/22/2016 12:30 05/24/2016 13:17 06/07/2016 13:47 06/14/2016 08:25  Kappa free light chain Latest Ref Range: 3.3 - 19.4 mg/L          136.9 (H)   Lamda free light chains Latest Ref Range: 5.7 - 26.3 mg/L          23.4   Kappa, lamda light chain ratio Latest Ref Range: 0.26 - 1.65           5.85 (H)   M Protein SerPl Elph-Mcnc Latest Ref Range: Not Observed g/dL          0.7 (H)     ASSESSMENT & PLAN:   Multiple myeloma in relapse (HCC) Multiple myeloma M protein- 3.1 g/dL; kappa/lambda ratio 140; multiple bone lesions. Status post Velcade/Revlimid- multiple interruptions because of hospitalizations poor tolerance. Most recent- M protein 0.7 g/dL; K/L=.136/23 [slightly up]  # Recommend starting Revlimid 5 mg once x 8 days; starting today.  # Diarrhea- [last 4 days;] ? Revlimid continue Imodium as needed currently improved.  # Hyponatremia sodium 127/tachycardic-recommend increasing saltt intake in diet.  #  Anemia hemoglobin 10.5 improving/Stable.   # Debility- continue physical therapy at home.   # B12 sublingual 1000 g at least 3 times a week  # Gastritis- Protonix liquid through the PEG tube; stable  # "Diaper rash"- vaseline/ improved.   # Recommend follow-up in 1 week CBC CMP.   All questions were answered. The  patient knows to call  the clinic with any problems, questions or concerns.    Cammie Sickle, MD 06/14/2016 10:38 AM

## 2016-06-14 NOTE — Assessment & Plan Note (Addendum)
Multiple myeloma M protein- 3.1 g/dL; kappa/lambda ratio 140; multiple bone lesions. Status post Velcade/Revlimid- multiple interruptions because of hospitalizations poor tolerance. Most recent- M protein 0.7 g/dL; K/L=.136/23 [slightly up]  # Recommend starting Revlimid 5 mg once x 8 days; starting today.  # Diarrhea- [last 4 days;] ? Revlimid continue Imodium as needed currently improved.  # Hyponatremia sodium 127/tachycardic-recommend increasing saltt intake in diet.  #  Anemia hemoglobin 10.5 improving/Stable.   # Debility- continue physical therapy at home.   # B12 sublingual 1000 g at least 3 times a week  # Gastritis- Protonix liquid through the PEG tube; stable  # "Diaper rash"- vaseline/ improved.   # Recommend follow-up in 1 week CBC CMP.

## 2016-06-14 NOTE — Progress Notes (Signed)
Patient here today for follow up on Multiple myeloma in relapse.  Patient is wanting to wanting to move to [place], asking if okay to travel by plane.

## 2016-06-15 ENCOUNTER — Telehealth: Payer: Self-pay | Admitting: *Deleted

## 2016-06-15 NOTE — Telephone Encounter (Signed)
Per Dr Rogue Bussing, call home health nurse or Dr Vira Agar who inserted the tube. She reports that there is no home health nurse, but will call D rElliott

## 2016-06-15 NOTE — Telephone Encounter (Signed)
-----   Message from [address] sent at 06/15/2016  8:57 AM EST ----- Regarding: leaking feeding tube Hassan Rowan I just transferred a call to your line but I know it is somewhat difficult to understand the person and so I wanted to make sure you get this from her message if she leaves you a voicemail. Mr. Debenedetti feeding tube is leaking. Thanks.

## 2016-06-15 NOTE — Telephone Encounter (Signed)
I spoke to Ong personally; his office would also call pt re: PEG tube mal-function.

## 2016-06-15 NOTE — Telephone Encounter (Signed)
Called to report that his feeding tube started leaking last night and she turned off the feeding. Asking for a  Call back for what to do.Please advise

## 2016-06-18 ENCOUNTER — Emergency Department
Admission: EM | Admit: 2016-06-18 | Discharge: 2016-06-18 | Disposition: A | Discharge status: Home / Self Care | Payer: Medicare Other | Attending: Emergency Medicine | Admitting: Emergency Medicine

## 2016-06-18 ENCOUNTER — Emergency Department: Payer: Medicare Other

## 2016-06-18 DIAGNOSIS — Z431 Encounter for attention to gastrostomy: Secondary | ICD-10-CM | POA: Diagnosis present

## 2016-06-18 DIAGNOSIS — Z79899 Other long term (current) drug therapy: Secondary | ICD-10-CM | POA: Insufficient documentation

## 2016-06-18 LAB — BASIC METABOLIC PANEL
Anion gap: 5 (ref 5–15)
BUN: 17 mg/dL (ref 6–20)
CHLORIDE: 101 mmol/L (ref 101–111)
CO2: 28 mmol/L (ref 22–32)
CREATININE: 0.55 mg/dL — AB (ref 0.61–1.24)
Calcium: 8.5 mg/dL — ABNORMAL LOW (ref 8.9–10.3)
GFR calc Af Amer: >60 mL/min (ref >60)
GFR calc non Af Amer: >60 mL/min (ref >60)
Glucose, Bld: 101 mg/dL — ABNORMAL HIGH (ref 65–99)
Potassium: 3.8 mmol/L (ref 3.5–5.1)
Sodium: 134 mmol/L — ABNORMAL LOW (ref 135–145)

## 2016-06-18 LAB — CBC WITH DIFFERENTIAL/PLATELET
Basophils Absolute: 0 10*3/uL (ref 0–0.1)
Basophils Relative: 1 %
EOS ABS: 0 10*3/uL (ref 0–0.7)
EOS PCT: 1 %
HCT: 33.3 % — ABNORMAL LOW (ref 40.0–52.0)
HEMOGLOBIN: 11.1 g/dL — AB (ref 13.0–18.0)
LYMPHS ABS: 1 10*3/uL (ref 1.0–3.6)
Lymphocytes Relative: 25 %
MCH: 29 pg (ref 26.0–34.0)
MCHC: 33.4 g/dL (ref 32.0–36.0)
MCV: 87 fL (ref 80.0–100.0)
MONOS PCT: 11 %
Monocytes Absolute: 0.5 10*3/uL (ref 0.2–1.0)
Neutro Abs: 2.6 10*3/uL (ref 1.4–6.5)
Neutrophils Relative %: 62 %
Platelets: 214 10*3/uL (ref 150–440)
RBC: 3.83 MIL/uL — ABNORMAL LOW (ref 4.40–5.90)
RDW: 17.3 % — ABNORMAL HIGH (ref 11.5–14.5)
WBC: 4.1 10*3/uL (ref 3.8–10.6)

## 2016-06-18 LAB — GLUCOSE, CAPILLARY
GLUCOSE-CAPILLARY: 97 mg/dL (ref 65–99)
Glucose-Capillary: 84 mg/dL (ref 65–99)

## 2016-06-18 MED ORDER — JEVITY 1.2 CAL PO LIQD
1000.0000 mL | ORAL | Status: DC
  Administered 2016-06-18: 1000 mL

## 2016-06-18 MED ORDER — SODIUM CHLORIDE 0.9 % IV BOLUS (SEPSIS)
1000.0000 mL | Freq: Once | INTRAVENOUS | Status: AC
  Administered 2016-06-18: 1000 mL via INTRAVENOUS

## 2016-06-18 MED ORDER — IOPAMIDOL (ISOVUE-300) INJECTION 61%
50.0000 mL | Freq: Once | INTRAVENOUS | Status: AC | PRN
  Administered 2016-06-18: 50 mL

## 2016-06-18 NOTE — Consult Note (Signed)
Reason for Consult:Feeding gastrostomy tube came out Referring Physician: Dr Lavetta Nielsen is an 70 y.o. male.  HPI: He had a percutaneous endoscopic gastrostomy on November 8. He had an 25 [place] tube inserted. He has history of multiple myeloma and recent dysphagia with significant weight loss. I have reviewed the operative report. 3 T-fasteners were used. He has been at home with his wife receiving 1.2-calorie per cc feedings. Yesterday there was some leakage around the tube. Today there was more leakage and the patient was brought to the emergency room. He was initially evaluated by the emergency room staff. He did have some dye inserted through the tube and an x-ray demonstrated the dye filled the stomach. However with the problem of leaking the old tube was removed. Dr.Schooler in gastroenterology inserted a 12 French Foley catheter but found that the balloon would not inflate. General surgery consultation was requested due to the problem of the feeding tube.  In interviewing the patient and wife his feeding tubes had been going well until yesterday. He reports no significant pain at the gastrostomy site. He reports no abdominal pain. His wife has been changing the dressing of the gastrostomy tube site and there is been minimal drainage and so for the leakage which started yesterday.  Past Medical History:  Diagnosis Date  . Anemia   . Cancer (HCC)    Bone metastasis  . Constipation   . Hearing loss   . Hoarse voice quality   . Liver lesion   . Malaria 2003  . Multiple myeloma ([place]) 03/22/2016   Per Dr. Rogue Bussing on his PET order.  . Tuberculosis 1985   I reviewed his history of multiple myeloma and recent development of significant dysphagia and weight loss. Past Surgical History:  Procedure Laterality Date  . PEG PLACEMENT N/A 05/18/2016   Procedure: PERCUTANEOUS ENDOSCOPIC GASTROSTOMY (PEG) PLACEMENT;  Surgeon: Manya Silvas, MD;  Location: [facility] ENDOSCOPY;   Service: Endoscopy;  Laterality: N/A;  . RETINAL DETACHMENT SURGERY  2006    Family History  Problem Relation Age of Onset  . Hypertension Mother     Social History:  reports that he has never smoked. He has never used smokeless tobacco. He reports that he does not drink alcohol or use drugs.  Allergies: No Known Allergies  Medications: Medicines include aspirin 81 mg daily, Thorazine 10 mg tablet 3 times daily, vitamin D3, Celexa 20 mg daily, Desitin ointment applied to Doppler rash, hydrocodone with acetaminophen as needed for pain,Revlimid 67m taken daily for 2 weeks and 1 week off. Nutritional supplement is Jevity 1.2-calorie, Zofran 8 mg H8 hours as needed for nausea, pantoprazole 40 mg 2 times per day, vitamin B12 1000 g daily  Results for orders placed or performed during the hospital encounter of 06/18/16 (from the past 48 hour(s))  Basic metabolic panel     Status: Abnormal   Collection Time: 06/18/16 12:00 PM  Result Value Ref Range   Sodium 134 (L) 135 - 145 mmol/L   Potassium 3.8 3.5 - 5.1 mmol/L   Chloride 101 101 - 111 mmol/L   CO2 28 22 - 32 mmol/L   Glucose, Bld 101 (H) 65 - 99 mg/dL   BUN 17 6 - 20 mg/dL   Creatinine, Ser 0.55 (L) 0.61 - 1.24 mg/dL   Calcium 8.5 (L) 8.9 - 10.3 mg/dL   GFR calc non Af Amer >60 >60 mL/min   GFR calc Af Amer >60 >60 mL/min    Comment: (NOTE) The  eGFR has been calculated using the CKD EPI equation. This calculation has not been validated in all clinical situations. eGFR's persistently <60 mL/min signify possible Chronic Kidney Disease.    Anion gap 5 5 - 15  CBC with Differential     Status: Abnormal   Collection Time: 06/18/16 12:00 PM  Result Value Ref Range   WBC 4.1 3.8 - 10.6 K/uL   RBC 3.83 (L) 4.40 - 5.90 MIL/uL   Hemoglobin 11.1 (L) 13.0 - 18.0 g/dL   HCT 33.3 (L) 40.0 - 52.0 %   MCV 87.0 80.0 - 100.0 fL   MCH 29.0 26.0 - 34.0 pg   MCHC 33.4 32.0 - 36.0 g/dL   RDW 17.3 (H) 11.5 - 14.5 %   Platelets 214 150 - 440  K/uL   Neutrophils Relative % 62 %   Neutro Abs 2.6 1.4 - 6.5 K/uL   Lymphocytes Relative 25 %   Lymphs Abs 1.0 1.0 - 3.6 K/uL   Monocytes Relative 11 %   Monocytes Absolute 0.5 0.2 - 1.0 K/uL   Eosinophils Relative 1 %   Eosinophils Absolute 0.0 0 - 0.7 K/uL   Basophils Relative 1 %   Basophils Absolute 0.0 0 - 0.1 K/uL  Glucose, capillary     Status: None   Collection Time: 06/18/16 12:06 PM  Result Value Ref Range   Glucose-Capillary 97 65 - 99 mg/dL  Glucose, capillary     Status: None   Collection Time: 06/18/16  3:42 PM  Result Value Ref Range   Glucose-Capillary 84 65 - 99 mg/dL    Dg Abd 1 View  Result Date: 06/18/2016 CLINICAL DATA:  Gastrostomy tube placement check EXAM: ABDOMEN - 1 VIEW COMPARISON:  04/29/2016 FINDINGS: There is contrast medium in the stomach and proximal duodenum. There is also contrast in the gastrostomy tube and the time of imaging. Left lateral rib fractures, at least 1 of which may be acute. Mild prominence of stool in the rectum. Lower thoracic compression fractures. Indistinct lytic lesions including the right sacrum and upper portion of the L4 vertebral body compatible with multiple myeloma. IMPRESSION: 1. Gastrostomy tube injection fills the stomach with contrast. There is also contrast in the G-tube. 2. Findings of multiple myeloma. 3. Left lateral rib fractures, at least 1 of which appears to be acute or subacute. 4. Mild prominence of stool in the rectum. Electronically Signed   By: Van Clines M.D.   On: 06/18/2016 12:24    ROS he reports no recent acute illness such as cough cold or sore throat. He reports generalized weakness and unable to walk very far. He does use a walker for limited amount of ambulation. He is mostly confined to bed. He has been taking some soft food by mouth. He reports occasional nausea. He reports some dyspnea on exertion. He reports no chest pain. He reports recently having diaper rash with associated discomfort with  pain. He did have some recent constipation but did have several bowel movements yesterday which were soft. He reports he has some mild difficulty emptying his bladder. Usually is incontinent voiding into his diaper. Review of systems otherwise negative. Blood pressure 132/90, pulse (!) 105, temperature 98.4 F (36.9 C), temperature source Oral, resp. rate 20, height 5' 7"  (1.702 m), weight 47 kg (103 lb 9.9 oz), SpO2 98 %. Physical Exam  GENERAL: He was resting on the emergency room stretcher and appears to be chronically you'll and very asthenic.  HEENT:  Head is normocephalic.  Pupils are equal reactive to light.  Extraocular movements are intact. Sclera is clear.  Pharynx is clear.  NECK:  Supple with no palpable mass and no adenopathy.  LUNGS:  Clear without rales rhonchi or wheezes.  HEART:  Regular rhythm S1-S2, without murmur.  ABDOMEN: There was a 12 [place] Foley catheter entering the gastrostomy site. There are 2 remaining buttons on the skin adjacent to the gastrostomy site. There is scant drainage around the buttons. There is no surrounding erythema. The abdomen was with moderate lower abdominal distention but was soft and nontender.  RECTUM there is feculent debris around the perineum. There appears to be some degree of rash in the perineal area. Digital exam demonstrates moderate anal discomfort. There was a large amount of soft stool  within the rectum. There was no impaction found.  PENIS: Appeared normal. He was rolled up onto his side and placed penis into the urinal and he was not able to pass any urine.  EXTREMITIES: There is no dependent edema. There is muscle wasting.  NEUROLOGIC he is awake alert and oriented and moving all extremities.  CLINICAL DATA: CBC is with a hemoglobin of 12.1, platelet count 214,000, white blood count 4100. Metabolic panel with a glucose of 101, creatinine 0.55.  Assessment/Plan: History of dysphagia and weight loss. His feeding tube is,  out and needs a new one inserted. I discussed the plan for insertion of new gastrostomy tube with the patient and wife.  PROCEDURE: With the patient on the stretcher in the supine position the dressing was removed from the left upper quadrant of the abdomen. The indwelling 12 French Foley catheter was removed. An 61 French feeding gastrostomy tube was easily inserted. The balloon was inflated with 20 cc of water. Traction was applied as the balloon was pulled up adjacent to the abdominal wall. 80 cc of water was easily instilled into the stomach. Dressings were applied using cotton gauze and advanced the disc up to the abdominal wall. Dressings were held in place with 2 inch paper tape. He tolerated this very well.  After this I discussed with the patient and wife also with the ER nurse and also with Dr.McShane.  We'll have him get up to bedside commode to see if he can empty his bladder and move his bowels. The nurse will give him 1 can of tube feeding. If he tolerates the tube feeding satisfactorily can return home to continue usual feedings  I recommended a follow-up appointment with Dr.Sankar in 1 or 2 weeks. We'll need to have the remaining 2 buttons removed.  Rochel Brome 06/18/2016, 4:20 PM

## 2016-06-18 NOTE — Progress Notes (Signed)
Gastroenterology Progress Note    Francisco [name] 70 y.o. May 30, 1946   Subjective: PEG tube malfunction. 18 French PEG tube placed on 05/18/16 and his wife reports it was working fine until last night when it started leaking. Small amount of fluid pushed into PEG tube leads to leakage around the tube and location of old PEG tube was confirmed with gastrograffin study today.   Objective: Vital signs in last 24 hours: Vitals:   06/18/16 1156  BP: 133/88  Pulse: (!) 110  Resp: 20  Temp: 98.4 F (36.9 C)    Physical Exam: Gen: alert, no acute distress, cachetic Abd: tender at PEG tube site; no erythema; old PEG tube not in tract during my evaluation; 2 anchor sites on each side of the PEG tube tract.  Lab Results:  Recent Labs  06/18/16 1200  NA 134*  K 3.8  CL 101  CO2 28  GLUCOSE 101*  BUN 17  CREATININE 0.55*  CALCIUM 8.5*   No results for input(s): AST, ALT, ALKPHOS, BILITOT, PROT, ALBUMIN in the last 72 hours.  Recent Labs  06/18/16 1200  WBC 4.1  NEUTROABS 2.6  HGB 11.1*  HCT 33.3*  MCV 87.0  PLT 214   No results for input(s): LABPROT, INR in the last 72 hours.  Attempted PEG tube change; An 76 French PEG tube replacement tube insertion attempted but resistance occurred after insertion to 2-3 cm at the abdominal wall and the PEG tube could not be advanced any further. Serosanginous gastric-appearing contents noted in the distal end of the PEG tube but no gastric contents could be aspirated. A 12 French Foley catheter was then inserted with mild resistance and advanced but resistance occurred during attempted balloon insufflation of the Foley catheter. Dressing applied by nursing staff  Assessment/Plan: 70 yo with multiple myeloma who had a PEG tube placed on 05/18/16 and now has a PEG tube malfunction with unsuccessful replacement tube likely due to closure of PEG tube tract. Patient not able to take POs because high risk of aspiration so will need admit by  hospitalist for IVFs and replacement PEG tube on Monday either endoscopically or by surgery.D/W wife and patient. D/W Dr. Burlene Arnt from [place].   Francisco Corner C. 06/18/2016, 2:49 PM

## 2016-06-18 NOTE — ED Notes (Signed)
Pt changed and dressed, waiting for EMS.

## 2016-06-18 NOTE — ED Notes (Signed)
Pt incontinent of BM. Cleaned and changed. Pt also assisted to bed side commode and able to urinate. Very small amount of stool passed.

## 2016-06-18 NOTE — ED Triage Notes (Signed)
BIB EMS from home c/o feeing tube leaking since last night. Pt with Hx of bone CA. Tube in place for 3 weeks.

## 2016-06-18 NOTE — Discharge Instructions (Signed)
Use the G-tube as normal, if there is difficulty using, including bleeding or signs of infection or a lot of drainage around return to the emergency room or follow closely with her primary care doctor. X-ray does note that there are possibly some rib fractures, which do not appear to have happened today. This is certainly something that can happen with multiple myeloma. He has not seen be suffering from pain. If he has trouble breathing or chest pain please return to the emergency department. Please make his doctor aware of these findings and follow closely with them.

## 2016-06-18 NOTE — ED Notes (Signed)
Waiting on feeding supplement from dining services.

## 2016-06-18 NOTE — ED Notes (Signed)
Feeding supplement running well on kangaroo pump. No leaking. Pt denies pain to abdomen.

## 2016-06-18 NOTE — ED Provider Notes (Addendum)
[facility] Emergency Department Provider Note  ____________________________________________   I have reviewed the triage vital signs and the nursing notes.   HISTORY  Chief Complaint feeding tube leaking    HPI Francisco [name] is a 70 y.o. male presents today complaining of drainage around the G-tube site. He has had a G-tube, 18, placed since November 8. He was seen shortly thereafter when it was clogged and it was unclogged. His been working fine until now. Patient states that the G-tube began to leak yesterday evening and this morning. No other symptoms they are worried that he is going to get dehydrated however as he is G-tube dependent.     Past Medical History:  Diagnosis Date  . Anemia   . Cancer (HCC)    Bone metastasis  . Constipation   . Hearing loss   . Hoarse voice quality   . Liver lesion   . Malaria 2003  . Multiple myeloma ([place]) 03/22/2016   Per Dr. Rogue Bussing on his PET order.  . Tuberculosis 1985    Patient Active Problem List   Diagnosis Date Noted  . Diarrhea 06/07/2016  . Dysphagia 05/17/2016  . Dyspnea 05/10/2016  . Pressure injury of skin 05/04/2016  . Protein-calorie malnutrition, severe 04/26/2016  . Pancytopenia ([place]) 04/25/2016  . Healthcare-associated pneumonia 04/25/2016  . Elevated transaminase level 04/25/2016  . SIRS (systemic inflammatory response syndrome) ([place]) 04/25/2016  . Anorexia   . Abnormal loss of weight   . Intractable vomiting with nausea 04/06/2016  . Dehydration 04/05/2016  . Multiple myeloma in relapse ([place]) 03/16/2016  . Pernicious anemia 03/16/2016    Past Surgical History:  Procedure Laterality Date  . PEG PLACEMENT N/A 05/18/2016   Procedure: PERCUTANEOUS ENDOSCOPIC GASTROSTOMY (PEG) PLACEMENT;  Surgeon: Manya Silvas, MD;  Location: [facility] ENDOSCOPY;  Service: Endoscopy;  Laterality: N/A;  . RETINAL DETACHMENT SURGERY  2006    Prior to Admission medications    Medication Sig Start Date End Date Taking? Authorizing Provider  aspirin 81 MG chewable tablet Chew 1 tablet (81 mg total) by mouth daily. 05/24/16   Cammie Sickle, MD  chlorproMAZINE (THORAZINE) 10 MG tablet Take 1 tablet (10 mg total) by mouth 3 (three) times daily as needed for hiccoughs. 05/24/16   Cammie Sickle, MD  Cholecalciferol (PA VITAMIN D-3) 1000 units tablet Take 1 tablet (1,000 Units total) by mouth daily. 05/24/16   Cammie Sickle, MD  citalopram (CELEXA) 20 MG tablet Take 1 tablet (20 mg total) by mouth daily. 05/24/16   Cammie Sickle, MD  Diaper Rash Products (DESITIN) OINT Apply 1 application topically 3 (three) times daily. Prn diaper rash    Historical Provider, MD  HYDROcodone-acetaminophen (HYCET) 7.5-325 mg/15 ml solution Place 5 mLs into feeding tube every 6 (six) hours as needed for moderate pain or severe pain. 05/24/16   Cammie Sickle, MD  lenalidomide (REVLIMID) 5 MG capsule Take 5 mg by mouth daily. Takes 2 weeks on and 1 week off.    Historical Provider, MD  Nutritional Supplements (FEEDING SUPPLEMENT, JEVITY 1.2 CAL,) LIQD Place 1,000 mLs into feeding tube continuous. 05/20/16   Fritzi Mandes, MD  Nutritional Supplements (FEEDING SUPPLEMENT, OSMOLITE 1.2 CAL,) LIQD Place 237 mLs into feeding tube continuous. Rate 50 ml/hr    Historical Provider, MD  ondansetron (ZOFRAN ODT) 8 MG disintegrating tablet Take 1 tablet (8 mg total) by mouth every 8 (eight) hours as needed for nausea or vomiting. Patient not taking: Reported on 06/14/2016  05/24/16   Cammie Sickle, MD  pantoprazole sodium (PROTONIX) 40 mg/20 mL PACK Place 20 mLs (40 mg total) into feeding tube 2 (two) times daily. 05/24/16   Cammie Sickle, MD  vitamin B-12 (CYANOCOBALAMIN) 1000 MCG tablet Take 1,000 mcg by mouth daily.    Historical Provider, MD  Water For Irrigation, Sterile (FREE WATER) SOLN Place 100 mLs into feeding tube every 6 (six) hours. 05/20/16   Fritzi Mandes, MD    Allergies Patient has no known allergies.  Family History  Problem Relation Age of Onset  . Hypertension Mother     Social History Social History  Substance Use Topics  . Smoking status: Never Smoker  . Smokeless tobacco: Never Used  . Alcohol use No    Review of Systems Constitutional: No fever/chills Eyes: No visual changes. ENT: No sore throat. No [name] neck no neck pain Cardiovascular: Denies chest pain. Respiratory: Denies shortness of breath. Gastrointestinal:   no vomiting.  No diarrhea.  No constipation. Genitourinary: Negative for dysuria. Musculoskeletal: Negative lower extremity swelling Skin: Negative for rash. Neurological: Negative for severe headaches, focal weakness or numbness. 10-point ROS otherwise negative.  ____________________________________________   PHYSICAL EXAM:  VITAL SIGNS: ED Triage Vitals  Enc Vitals Group     BP 06/18/16 1156 133/88     Pulse Rate 06/18/16 1156 (!) 110     Resp 06/18/16 1156 20     Temp 06/18/16 1156 98.4 F (36.9 C)     Temp Source 06/18/16 1156 Oral     SpO2 06/18/16 1156 99 %     Weight 06/18/16 1157 103 lb 9.9 oz (47 kg)     Height 06/18/16 1157 5' 7"  (1.702 m)     Head Circumference --      Peak Flow --      Pain Score --      Pain Loc --      Pain Edu? --      Excl. in [place]? --     Constitutional: Alert and oriented. Well appearing and in no acute distress.Chronically ill-appearing but in no acute distress at this time Mouth/Throat: Mucous membranes are moist.  Oropharynx non-erythematous. Neck: No stridor.   Nontender with no meningismus Cardiovascular: Normal rate, regular rhythm. Grossly normal heart sounds.  Good peripheral circulation. Respiratory: Normal respiratory effort.  No retractions. Lungs CTAB. Abdominal: Soft and nontender. No distention. No guarding no rebound G-tube site is clean and intact, there is immediate drainage however of water administered through both ports and  the purple section.There is 2 what appeared to be anchoring buttons still states the skin inside the stoma. Skin:  Skin is warm, dry and intact. No rash noted. Psychiatric: Mood and affect are normal. Speech and behavior are normal.  ____________________________________________   LABS (all labs ordered are listed, but only abnormal results are displayed)  Labs Reviewed  BASIC METABOLIC PANEL - Abnormal; Notable for the following:       Result Value   Sodium 134 (*)    Glucose, Bld 101 (*)    Creatinine, Ser 0.55 (*)    Calcium 8.5 (*)    All other components within normal limits  CBC WITH DIFFERENTIAL/PLATELET - Abnormal; Notable for the following:    RBC 3.83 (*)    Hemoglobin 11.1 (*)    HCT 33.3 (*)    RDW 17.3 (*)    All other components within normal limits  GLUCOSE, CAPILLARY   ____________________________________________  EKG  I personally interpreted  any EKGs ordered by me or triage  ____________________________________________  RADIOLOGY  I reviewed any imaging ordered by me or triage that were performed during my shift and, if possible, patient and/or family made aware of any abnormal findings. ____________________________________________   PROCEDURES  Procedure(s) performed: None  Procedures  Critical Care performed: None  ____________________________________________   INITIAL IMPRESSION / ASSESSMENT AND PLAN / ED COURSE  Pertinent labs & imaging results that were available during my care of the patient were reviewed by me and considered in my medical decision making (see chart for details).  Patient with multiple myeloma, has a noninfected G-tube. His recently placed. I did place a contrast bolus in there and it is in the right spot. However, we try to instill any significant amount of fluid, it leaks immediately, also, there is no ability to pull back. Currently, did discuss with GI, they will come evaluate the patient. I appreciate the consult. In  the meantime, did give him IV fluids to ensure that he does not become dehydrated during this hiatus from his G-tube, we did basic blood work which is reassuring. X-ray as noted. Patient does not have significant knee rib pain, there is been no fall, and we will defer to primary care the blood appeared to be subacute rib injuries.  ----------------------------------------- 2:09 PM on 06/18/2016 -----------------------------------------  GI medicine came to the room, that tablet G-tube had apparently been pulled out by the patient; it was placed and  intact last we were in the room. They're unable to pass a 18 G-tube. They are trying a smaller tube to see if that will work.  ----------------------------------------- 2:31 PM on 06/18/2016 -----------------------------------------  Dr. Nelida Meuse was unable to place g tube, he placed a 12 foley to try to keep the tract open, but he was unable to inflate that balloon.  Discussed w dr Tamala Julian, of surgery who is on call for dr sankar who will come see pt   Clinical Course    ____________________________________________   FINAL CLINICAL IMPRESSION(S) / ED DIAGNOSES  Final diagnoses:  Attention to G-tube [facility])      This chart was dictated using voice recognition software.  Despite best efforts to proofread,  errors can occur which can change meaning.      Schuyler Amor, MD 06/18/16 [place], MD 06/18/16 [place], MD 06/18/16 [phone]

## 2016-06-18 NOTE — ED Notes (Signed)
GI doctor at bedside

## 2016-06-18 NOTE — ED Notes (Signed)
Called EMS for pt to go back home for discharge.

## 2016-06-18 NOTE — ED Notes (Signed)
Dr. Tamala Julian re-inserted G-tube. No leaking. Will get jevity to make sure tube works correctly per Dr. Tamala Julian.

## 2016-06-18 NOTE — ED Notes (Signed)
Inserted NS into G-tube to see where leakage was coming from. MD present. Both ports of G-tube leaked after 7ml inserted.

## 2016-06-18 NOTE — ED Notes (Signed)
Sutures and g-tube removed by GI doc. 12 fr foley placed by GI doc. Unable to inflate balloon, gi doctor aware. Dressed area.

## 2016-06-20 ENCOUNTER — Telehealth: Payer: Self-pay | Admitting: *Deleted

## 2016-06-20 NOTE — Telephone Encounter (Signed)
Patient was seen in the ER on 06/18/16 does the patient need to come see you for a office visit f/u?

## 2016-06-21 ENCOUNTER — Encounter: Payer: Self-pay | Admitting: *Deleted

## 2016-06-21 ENCOUNTER — Inpatient Hospital Stay (HOSPITAL_BASED_OUTPATIENT_CLINIC_OR_DEPARTMENT_OTHER): Payer: Medicare Other | Admitting: Internal Medicine

## 2016-06-21 ENCOUNTER — Inpatient Hospital Stay: Payer: Medicare Other

## 2016-06-21 VITALS — BP 133/92 | HR 112 | Temp 98.3°F | Resp 20 | Ht 67.0 in | Wt 95.0 lb

## 2016-06-21 DIAGNOSIS — Z8611 Personal history of tuberculosis: Secondary | ICD-10-CM

## 2016-06-21 DIAGNOSIS — C9002 Multiple myeloma in relapse: Secondary | ICD-10-CM | POA: Diagnosis not present

## 2016-06-21 DIAGNOSIS — Z8613 Personal history of malaria: Secondary | ICD-10-CM

## 2016-06-21 DIAGNOSIS — Z931 Gastrostomy status: Secondary | ICD-10-CM

## 2016-06-21 DIAGNOSIS — I700 Atherosclerosis of aorta: Secondary | ICD-10-CM

## 2016-06-21 DIAGNOSIS — R490 Dysphonia: Secondary | ICD-10-CM | POA: Diagnosis not present

## 2016-06-21 DIAGNOSIS — R000 Tachycardia, unspecified: Secondary | ICD-10-CM

## 2016-06-21 DIAGNOSIS — R197 Diarrhea, unspecified: Secondary | ICD-10-CM

## 2016-06-21 DIAGNOSIS — K769 Liver disease, unspecified: Secondary | ICD-10-CM

## 2016-06-21 DIAGNOSIS — E538 Deficiency of other specified B group vitamins: Secondary | ICD-10-CM | POA: Insufficient documentation

## 2016-06-21 DIAGNOSIS — E876 Hypokalemia: Secondary | ICD-10-CM | POA: Diagnosis not present

## 2016-06-21 DIAGNOSIS — I517 Cardiomegaly: Secondary | ICD-10-CM

## 2016-06-21 DIAGNOSIS — K5900 Constipation, unspecified: Secondary | ICD-10-CM

## 2016-06-21 DIAGNOSIS — R918 Other nonspecific abnormal finding of lung field: Secondary | ICD-10-CM

## 2016-06-21 DIAGNOSIS — D649 Anemia, unspecified: Secondary | ICD-10-CM

## 2016-06-21 DIAGNOSIS — Z79899 Other long term (current) drug therapy: Secondary | ICD-10-CM

## 2016-06-21 DIAGNOSIS — R627 Adult failure to thrive: Secondary | ICD-10-CM

## 2016-06-21 DIAGNOSIS — Z8781 Personal history of (healed) traumatic fracture: Secondary | ICD-10-CM

## 2016-06-21 DIAGNOSIS — Z7982 Long term (current) use of aspirin: Secondary | ICD-10-CM

## 2016-06-21 LAB — CBC WITH DIFFERENTIAL/PLATELET
Basophils Absolute: 0 10*3/uL (ref 0–0.1)
Basophils Relative: 1 %
EOS ABS: 0.1 10*3/uL (ref 0–0.7)
EOS PCT: 2 %
HCT: 31.8 % — ABNORMAL LOW (ref 40.0–52.0)
Hemoglobin: 10.6 g/dL — ABNORMAL LOW (ref 13.0–18.0)
LYMPHS ABS: 1.1 10*3/uL (ref 1.0–3.6)
Lymphocytes Relative: 31 %
MCH: 28.9 pg (ref 26.0–34.0)
MCHC: 33.2 g/dL (ref 32.0–36.0)
MCV: 86.8 fL (ref 80.0–100.0)
MONOS PCT: 8 %
Monocytes Absolute: 0.3 10*3/uL (ref 0.2–1.0)
Neutro Abs: 2.1 10*3/uL (ref 1.4–6.5)
Neutrophils Relative %: 58 %
PLATELETS: 238 10*3/uL (ref 150–440)
RBC: 3.67 MIL/uL — ABNORMAL LOW (ref 4.40–5.90)
RDW: 16.5 % — ABNORMAL HIGH (ref 11.5–14.5)
WBC: 3.5 10*3/uL — ABNORMAL LOW (ref 3.8–10.6)

## 2016-06-21 LAB — COMPREHENSIVE METABOLIC PANEL
ALK PHOS: 105 U/L (ref 38–126)
ALT: 27 U/L (ref 17–63)
ANION GAP: 7 (ref 5–15)
AST: 26 U/L (ref 15–41)
Albumin: 2.9 g/dL — ABNORMAL LOW (ref 3.5–5.0)
BUN: 13 mg/dL (ref 6–20)
CALCIUM: 8.2 mg/dL — AB (ref 8.9–10.3)
CHLORIDE: 95 mmol/L — AB (ref 101–111)
CO2: 28 mmol/L (ref 22–32)
Creatinine, Ser: 0.53 mg/dL — ABNORMAL LOW (ref 0.61–1.24)
Glucose, Bld: 113 mg/dL — ABNORMAL HIGH (ref 65–99)
Potassium: 3.3 mmol/L — ABNORMAL LOW (ref 3.5–5.1)
SODIUM: 130 mmol/L — AB (ref 135–145)
Total Bilirubin: 0.6 mg/dL (ref 0.3–1.2)
Total Protein: 6.1 g/dL — ABNORMAL LOW (ref 6.5–8.1)

## 2016-06-21 MED ORDER — LENALIDOMIDE 5 MG PO CAPS: ORAL_CAPSULE | ORAL | 6 refills | Status: DC

## 2016-06-21 NOTE — Progress Notes (Signed)
[facility] CONSULT NOTE  Patient Care Team: Serena S Zhou-Talbert, MD as PCP - General (Family Medicine)  Ms. Paige Frick; [place]  CHIEF COMPLAINTS/PURPOSE OF CONSULTATION:  Multiple lytic lesions  #  Oncology History   # SEP 2017- Multiple Myeloma lytic lesions- Thoracic spine/ vertebral compression fractures; IgG Kappa- 3.1gm/dl; K/L-140. Vel-Rev-   # FTT s/p PEG [Dr. Elliot];   # Abnormal CXR;? 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  male  With New diagnosis of multiple myeloma IgG kappa- multiple bone lesions/anemia Currently on single agent Revlimid is here for follow-up. Patient has been on Revlimid 5 mg for the last 1 week.   In the interim patient was evaluated in the emergency room for PEG tube malfunction; it had to be replaced. Currently working well.   States his diarrhea is improved. Denies any constipation. Continues to have generalized weakness for which he continues to work with physical therapy. Overall slight improvement noted. No fever no chills.  ROS: A complete 10 point review of system is done which is negative except mentioned above in history of present illness  MEDICAL HISTORY:  Past Medical History:  Diagnosis Date  . Anemia   . Cancer (HCC)    Bone metastasis  . Constipation   . Hearing loss   . Hoarse voice quality   . Liver lesion   . Malaria 2003  . Multiple myeloma (HCC) 03/22/2016   Per Dr. Brahmanday on his PET order.  . Tuberculosis 1985    SURGICAL HISTORY: Past Surgical History:  Procedure Laterality Date  . PEG PLACEMENT N/A 05/18/2016   Procedure: PERCUTANEOUS ENDOSCOPIC GASTROSTOMY (PEG) PLACEMENT;  Surgeon: Robert T Elliott, MD;  Location: [facility] ENDOSCOPY;  Service: Endoscopy;  Laterality: N/A;  . 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 No smoking or alcohol.  Social History   Social History  . Marital status: Married    Spouse name: N/A  . Number of children: N/A  . Years of education: N/A   Occupational History  . Not on file.   Social History Main Topics  . Smoking status: Never Smoker  . Smokeless tobacco: Never Used  . Alcohol use No  . Drug use: No  . Sexual activity: Not on file   Other Topics Concern  . Not on file   Social History Narrative  . No narrative on file    FAMILY HISTORY: no history of cancers in the family. Family History  Problem Relation Age of Onset  . Hypertension Mother     ALLERGIES:  has No Known Allergies.  MEDICATIONS:  Current Outpatient Prescriptions  Medication Sig Dispense Refill  . aspirin 81 MG chewable tablet Chew 1 tablet (81 mg total) by mouth daily. 30 tablet 8  . Cholecalciferol (PA VITAMIN D-3) 1000 units tablet Take 1 tablet (1,000 Units total) by mouth daily. 30 tablet 6  . Diaper Rash Products (DESITIN) OINT Apply 1 application topically 3 (three) times daily. Prn diaper rash    . lenalidomide (REVLIMID) 5 MG capsule 1 week on and 1 week off. 7 capsule 6  . Nutritional Supplements (FEEDING SUPPLEMENT, OSMOLITE 1.2 CAL,) LIQD Place 237 mLs into feeding tube continuous. Rate 50 ml/hr    . pantoprazole sodium (PROTONIX) 40 mg/20 mL PACK Place 20 mLs (40 mg total) into feeding tube   2 (two) times daily. 60 mL 6  . vitamin B-12 (CYANOCOBALAMIN) 1000 MCG tablet Take 1,000 mcg by mouth daily.    . Water For Irrigation, Sterile (FREE WATER) SOLN Place 100 mLs into feeding tube every 6 (six) hours. 500 mL 1  . HYDROcodone-acetaminophen (HYCET) 7.5-325 mg/15 ml solution Place 5 mLs into feeding tube every 6 (six) hours as needed for moderate pain or severe pain. (Patient not taking: Reported on 06/21/2016) 120 mL 0   Current Facility-Administered Medications  Medication Dose Route Frequency Provider Last Rate Last Dose  . ondansetron (ZOFRAN-ODT)  disintegrating tablet 8 mg  8 mg Oral Q8H PRN Govinda R Brahmanday, MD          .  PHYSICAL EXAMINATION: ECOG PERFORMANCE STATUS: 3 - Symptomatic, >50% confined to bed  Vitals:   06/21/16 1150  BP: (!) 133/92  Pulse: (!) 112  Resp: 20  Temp: 98.3 F (36.8 C)   Filed Weights   06/21/16 1245  Weight: 95 lb (43.1 kg)    GENERAL: Thin built Cachectic-appearing male patient; Alert, no distress and comfortable.   With family. In a wheelchair.  EYES: no pallor or icterus OROPHARYNX: positive for thrush.  NECK: supple, no masses felt LYMPH:  no palpable lymphadenopathy in the cervical, axillary or inguinal regions LUNGS: clear to auscultation and  No wheeze or crackles HEART/CVS: regular rate & rhythm and no murmurs; No lower extremity edema ABDOMEN: abdomen soft, non-tender and normal bowel sounds; PEG tube in place. No leakage noted. Musculoskeletal:no cyanosis of digits and no clubbing  PSYCH: alert & oriented x 3 with fluent speech NEURO: no focal motor/sensory deficits SKIN:  no rashes or significant lesions  LABORATORY DATA:  I have reviewed the data as listed Lab Results  Component Value Date   WBC 3.5 (L) 06/21/2016   HGB 10.6 (L) 06/21/2016   HCT 31.8 (L) 06/21/2016   MCV 86.8 06/21/2016   PLT 238 06/21/2016    Recent Labs  05/24/16 1317 06/07/16 1347 06/14/16 0825 06/18/16 1200 06/21/16 1131  NA 141 127* 129* 134* 130*  K 3.9 4.0 3.7 3.8 3.3*  CL 106 94* 95* 101 95*  CO2 27 25 25 28 28  GLUCOSE 137* 114* 120* 101* 113*  BUN 23* 16 16 17 13  CREATININE 0.63 0.49* 0.55* 0.55* 0.53*  CALCIUM 8.5* 8.3* 8.3* 8.5* 8.2*  GFRNONAA >60 >60 >60 >60 >60  GFRAA >60 >60 >60 >60 >60  PROT 7.2 6.3*  --   --  6.1*  ALBUMIN 2.6* 2.7*  --   --  2.9*  AST 19 20  --   --  26  ALT 15* 21  --   --  27  ALKPHOS 157* 143*  --   --  105  BILITOT 0.4 0.4  --   --  0.6    RADIOGRAPHIC STUDIES: I have personally reviewed the radiological images as listed and agreed with  the findings in the report. Dg Abd 1 View  Result Date: 06/18/2016 CLINICAL DATA:  Gastrostomy tube placement check EXAM: ABDOMEN - 1 VIEW COMPARISON:  04/29/2016 FINDINGS: There is contrast medium in the stomach and proximal duodenum. There is also contrast in the gastrostomy tube and the time of imaging. Left lateral rib fractures, at least 1 of which may be acute. Mild prominence of stool in the rectum. Lower thoracic compression fractures. Indistinct lytic lesions including the right sacrum and upper portion of the L4 vertebral body compatible with multiple myeloma. IMPRESSION: 1.   Gastrostomy tube injection fills the stomach with contrast. There is also contrast in the G-tube. 2. Findings of multiple myeloma. 3. Left lateral rib fractures, at least 1 of which appears to be acute or subacute. 4. Mild prominence of stool in the rectum. Electronically Signed   By: Walter  Liebkemann M.D.   On: 06/18/2016 12:24   Results for Murdy, Walker (MRN [id number]) as of 05/24/2016 14:01  Ref. Range 03/16/2016 14:59 04/27/2016 18:53  Kappa free light chain Latest Ref Range: 3.3 - 19.4 mg/L 1,005.1 (H) 44.4 (H)  Lamda free light chains Latest Ref Range: 5.7 - 26.3 mg/L 7.1 5.4 (L)  Kappa, lamda light chain ratio Latest Ref Range: 0.26 - 1.65  141.56 (H) 8.22 (H)   Results for Halk, Jovonta (MRN [id number]) as of 05/24/2016 14:01  Ref. Range 03/16/2016 14:59 04/27/2016 18:53  M Protein SerPl Elph-Mcnc Latest Ref Range: Not Observed g/dL 3.1 (H) 0.8 (H)   Results for Bramhall, Sayeed (MRN [id number]) as of 06/14/2016 09:05  Ref. Range 05/20/2016 11:27 05/20/2016 16:40 05/20/2016 20:30 05/21/2016 00:26 05/21/2016 04:09 05/21/2016 07:48 05/21/2016 11:44 05/22/2016 12:30 05/24/2016 13:17 06/07/2016 13:47 06/14/2016 08:25  Kappa free light chain Latest Ref Range: 3.3 - 19.4 mg/L          136.9 (H)   Lamda free light chains Latest Ref Range: 5.7 - 26.3 mg/L          23.4   Kappa, lamda light chain ratio  Latest Ref Range: 0.26 - 1.65           5.85 (H)   M Protein SerPl Elph-Mcnc Latest Ref Range: Not Observed g/dL          0.7 (H)     ASSESSMENT & PLAN:   Multiple myeloma in relapse (HCC) Multiple myeloma M protein- 3.1 g/dL; kappa/lambda ratio 140; multiple bone lesions. Status post Velcade/Revlimid- multiple interruptions because of hospitalizations poor tolerance. Most recent- M protein 0.7 g/dL; K/L=.136/23 [slightly up]; recommend starting back on Velcade next week. New prescription for Revlimid 5 mg 1 week ON and one week off was given.  # B12 deficiency- recommend B12 injections. ContinueB12 sublingual 1000 g at least 3 times a week  # Mild hypokalemia potassium 3.3 recommend that you supplementation.  # Gastritis improved. Continue Protonix.  #  Anemia hemoglobin 10.5 improving/Stable.   # Hoarseness of voice/difficulty swallowing- declines ENT evaluation.  # Debility- continue physical therapy at home; slight improvement noted.  # Recommend follow-up in 1 week CBC CMP/myeloma panel; Velcade injection.  All questions were answered. The patient knows to call the clinic with any problems, questions or concerns.    Govinda R Brahmanday, MD 06/21/2016 1:21 PM 

## 2016-06-21 NOTE — Progress Notes (Signed)
Patient is currently on revlimid. His diarrhea episodes have resvolved.  Limited po intake; however, he is tolerating swallowing the oral revlimid without dysphagia. Pt using osmolite 1.2 ml - at 45 ml/ hour rate.   There is a new caregiver with the patient today that is not "familiar with the goals of therapy and purpose of revlmid" states that son is in [place] and could not come to today's visit.  Caregiver has multiple concerns about the feeding tube and timing on evaluation of the tube. States that pt was not evaluated by Dr. Vira Agar. States that pt had "gone to the ED recently for evaluation of leaking g-tube. Pt contemplating on seeing a different gastric provider other than Dr. Vira Agar if possible. Would like to further explore this option after speaking to Dr. Tish Men."  Family has multiple questions regarding interpreting the kappa light chain results and mult. myleoma panels.  Would like to discuss "anticipated outcome of the revlimid including when" pt "could stop the drug. When is he going to be cured of this disease."  Educated pt and family that multiple myeloma is not cureable but the goal of therapy is to maintain and control the disease process.  Per caregiver, pt is "doing 60% of the feedings at night" as the patient "desires to sleep through the feeding."  Pt is lying flat during feeding and does not want to use pillow.  Reinforced teaching on feeding tube and educated pt to elevate head of bed to 45 degree angle to avoid abd discomfort / bloating during feeding.  Caregiver would like to discuss goals of caloric intake with provider. I also offered dietary consult with Joli, but the caregiver declined.  Teach back process performed with patient and caregiver.

## 2016-06-21 NOTE — Assessment & Plan Note (Addendum)
Multiple myeloma M protein- 3.1 g/dL; kappa/lambda ratio 140; multiple bone lesions. Status post Velcade/Revlimid- multiple interruptions because of hospitalizations poor tolerance. Most recent- M protein 0.7 g/dL; K/L=.136/23 [slightly up]; recommend starting back on Velcade next week. New prescription for Revlimid 5 mg 1 week ON and one week off was given.  # B12 deficiency- recommend B12 injections. ContinueB12 sublingual 1000 g at least 3 times a week  # Mild hypokalemia potassium 3.3 recommend that you supplementation.  # Gastritis improved. Continue Protonix.  #  Anemia hemoglobin 10.5 improving/Stable.   # Hoarseness of voice/difficulty swallowing- declines ENT evaluation.  # Debility- continue physical therapy at home; slight improvement noted.  # Recommend follow-up in 1 week CBC CMP/myeloma panel; Velcade injection.

## 2016-06-22 ENCOUNTER — Ambulatory Visit: Payer: Medicare Other | Admitting: General Surgery

## 2016-06-23 NOTE — Progress Notes (Signed)
Nutrition Follow-up via phone   Spoke with nephew Clovis Cao this pm regarding tube feeding tolerance. Chavda reports tube feeding has been running at 9ml/hr typically but family has been turning it off. Report tube was off for 5 hours today but unable to determine reason why?? Chavda reports that patient likes to lay flat and knows that tube feeding should not be running while patient is lying flat.   Reports diarrhea is better, no report of vomiting.    Reports that he is giving water through tube reported 5-6 oz then reported 4 oz about 2 times-6 times per day.  Reports that patient is taking in at least 4 oz of liquid via mouth or more.    Weight noted on 12/12 95 pounds, on 12/9 103 lb (given fluids), on 11/14 85 pounds.    Chavda reports urine is light in color.   Medications: reviewed  Labs: Na 130, K 3.3, glucose 113  Estimated energy needs: 1170-1300 kcals,  45-59 g of protein and 1.3 L/d fluids.  NUTRITION DIAGNOSIS: Inadequate oral intake continues as evidenced by tube feeding   INTERVENTION:   Clovis Cao reports that family does not want to increase tube feeding to 89ml/hr wants to keep it at 31ml/hr.  Recommend that he run this for at least 22 hours per day so patient will get the nutrition that he needs.  At this rate patient will receive 1188 kcals, 55 g of protein and 845ml free water.  Patient will need additional free water flush of at least 167ml 4 times per day either via mouth or tube.  Patient will need to consume additional calories orally to meet nutritional needs (additional ~200 kcals).  Spent time discussing importance of nutrition and if tube feeding turned off patient will not be getting nutrition that he needs.  Discussed if patient in severe abdominal pain, excessive diarrhea, vomiting, unable to lay flat then those would be indications for turning tube feeding off and to notify RD and then at that point could adjust tube feeding.  Discussed that rate of tube feeding  and water flush may need to be adjust in the future if patient unable to gain weight.  Chavda verbalized understanding.       MONITORING, EVALUATION, GOAL: Patient to tolerate tube feeding to promote weight gain and improve strength.     NEXT VISIT:  As needed   Adalida Garver B. Zenia Resides, [place], [place] (pager)

## 2016-06-28 ENCOUNTER — Inpatient Hospital Stay (HOSPITAL_BASED_OUTPATIENT_CLINIC_OR_DEPARTMENT_OTHER): Payer: Medicare Other | Admitting: Internal Medicine

## 2016-06-28 ENCOUNTER — Inpatient Hospital Stay: Payer: Medicare Other

## 2016-06-28 ENCOUNTER — Ambulatory Visit: Payer: Medicare Other | Admitting: General Surgery

## 2016-06-28 VITALS — BP 131/87 | HR 111 | Temp 98.6°F | Resp 20 | Wt 95.3 lb

## 2016-06-28 DIAGNOSIS — C9002 Multiple myeloma in relapse: Secondary | ICD-10-CM

## 2016-06-28 DIAGNOSIS — Z8611 Personal history of tuberculosis: Secondary | ICD-10-CM

## 2016-06-28 DIAGNOSIS — E871 Hypo-osmolality and hyponatremia: Secondary | ICD-10-CM | POA: Diagnosis not present

## 2016-06-28 DIAGNOSIS — R197 Diarrhea, unspecified: Secondary | ICD-10-CM | POA: Diagnosis not present

## 2016-06-28 DIAGNOSIS — E876 Hypokalemia: Secondary | ICD-10-CM | POA: Diagnosis not present

## 2016-06-28 DIAGNOSIS — Z8613 Personal history of malaria: Secondary | ICD-10-CM

## 2016-06-28 DIAGNOSIS — I700 Atherosclerosis of aorta: Secondary | ICD-10-CM

## 2016-06-28 DIAGNOSIS — Z79899 Other long term (current) drug therapy: Secondary | ICD-10-CM

## 2016-06-28 DIAGNOSIS — Z8781 Personal history of (healed) traumatic fracture: Secondary | ICD-10-CM

## 2016-06-28 DIAGNOSIS — K5900 Constipation, unspecified: Secondary | ICD-10-CM

## 2016-06-28 DIAGNOSIS — E538 Deficiency of other specified B group vitamins: Secondary | ICD-10-CM

## 2016-06-28 DIAGNOSIS — I517 Cardiomegaly: Secondary | ICD-10-CM

## 2016-06-28 DIAGNOSIS — L22 Diaper dermatitis: Secondary | ICD-10-CM

## 2016-06-28 DIAGNOSIS — Z931 Gastrostomy status: Secondary | ICD-10-CM

## 2016-06-28 DIAGNOSIS — K769 Liver disease, unspecified: Secondary | ICD-10-CM

## 2016-06-28 DIAGNOSIS — R490 Dysphonia: Secondary | ICD-10-CM

## 2016-06-28 DIAGNOSIS — Z7982 Long term (current) use of aspirin: Secondary | ICD-10-CM

## 2016-06-28 DIAGNOSIS — R000 Tachycardia, unspecified: Secondary | ICD-10-CM

## 2016-06-28 DIAGNOSIS — K2970 Gastritis, unspecified, without bleeding: Secondary | ICD-10-CM

## 2016-06-28 DIAGNOSIS — R627 Adult failure to thrive: Secondary | ICD-10-CM

## 2016-06-28 DIAGNOSIS — D649 Anemia, unspecified: Secondary | ICD-10-CM

## 2016-06-28 DIAGNOSIS — R918 Other nonspecific abnormal finding of lung field: Secondary | ICD-10-CM

## 2016-06-28 LAB — COMPREHENSIVE METABOLIC PANEL
ALBUMIN: 3 g/dL — AB (ref 3.5–5.0)
ALT: 18 U/L (ref 17–63)
ANION GAP: 7 (ref 5–15)
AST: 18 U/L (ref 15–41)
Alkaline Phosphatase: 86 U/L (ref 38–126)
BILIRUBIN TOTAL: 0.5 mg/dL (ref 0.3–1.2)
BUN: 15 mg/dL (ref 6–20)
CO2: 28 mmol/L (ref 22–32)
Calcium: 8.7 mg/dL — ABNORMAL LOW (ref 8.9–10.3)
Chloride: 97 mmol/L — ABNORMAL LOW (ref 101–111)
Creatinine, Ser: 0.5 mg/dL — ABNORMAL LOW (ref 0.61–1.24)
Glucose, Bld: 107 mg/dL — ABNORMAL HIGH (ref 65–99)
Potassium: 3.5 mmol/L (ref 3.5–5.1)
Sodium: 132 mmol/L — ABNORMAL LOW (ref 135–145)
Total Protein: 6.6 g/dL (ref 6.5–8.1)

## 2016-06-28 LAB — CBC WITH DIFFERENTIAL/PLATELET
BASOS ABS: 0 10*3/uL (ref 0–0.1)
Basophils Relative: 1 %
EOS ABS: 0 10*3/uL (ref 0–0.7)
EOS PCT: 1 %
HCT: 33.7 % — ABNORMAL LOW (ref 40.0–52.0)
HEMOGLOBIN: 11.1 g/dL — AB (ref 13.0–18.0)
LYMPHS ABS: 1.5 10*3/uL (ref 1.0–3.6)
LYMPHS PCT: 28 %
MCH: 28.6 pg (ref 26.0–34.0)
MCHC: 32.9 g/dL (ref 32.0–36.0)
MCV: 86.8 fL (ref 80.0–100.0)
Monocytes Absolute: 0.9 10*3/uL (ref 0.2–1.0)
Monocytes Relative: 17 %
NEUTROS PCT: 53 %
Neutro Abs: 2.8 10*3/uL (ref 1.4–6.5)
Platelets: 275 10*3/uL (ref 150–440)
RBC: 3.88 MIL/uL — AB (ref 4.40–5.90)
RDW: 16.3 % — AB (ref 11.5–14.5)
WBC: 5.2 10*3/uL (ref 3.8–10.6)

## 2016-06-28 MED ORDER — BORTEZOMIB CHEMO SQ INJECTION 3.5 MG (2.5MG/ML)
1.3000 mg/m2 | Freq: Once | INTRAMUSCULAR | Status: AC
  Administered 2016-06-28: 2 mg via SUBCUTANEOUS
  Filled 2016-06-28: qty 0.8

## 2016-06-28 MED ORDER — CYANOCOBALAMIN 1000 MCG/ML IJ SOLN
1000.0000 ug | Freq: Once | INTRAMUSCULAR | Status: AC
  Administered 2016-06-28: 1000 ug via INTRAMUSCULAR
  Filled 2016-06-28: qty 1

## 2016-06-28 NOTE — Progress Notes (Signed)
Patient is here for follow up, he is doing better. He has gained some weight, he also mentions his legs get really cold.

## 2016-06-28 NOTE — Assessment & Plan Note (Addendum)
Multiple myeloma M protein- 3.1 g/dL; kappa/lambda ratio 140; multiple bone lesions. Status post Velcade/Revlimid- multiple interruptions because of hospitalizations poor tolerance. Most recent- M protein 0.7 g/dL; K/L=.136/23 [slightly up]; recommend starting back on Velcade next week. New prescription for Revlimid 5 mg 1 week ON and one week off was given. Re-start revlimid today/ tomorrow x1 week- ON & 1 week OFF.   # B12 deficiency- recommend B12 injections; starting today.  # Gastritis improved. Continue Protonix.  #  Anemia hemoglobin 11.5 improving/Stable.   # Hoarseness of voice/difficulty swallowing- declines ENT evaluation.  # Debility- continue physical therapy at home; slight improvement noted.  # Recommend follow-up in 2 week CBC CMP/myeloma panel; Velcade injection.

## 2016-06-28 NOTE — Progress Notes (Signed)
[place] NOTE  Patient Care Team: Francisco Flavors, MD as PCP - General (Family Medicine)  Ms. Francisco [name]; [facility]  CHIEF COMPLAINTS/PURPOSE OF CONSULTATION:  Multiple lytic lesions  #  Oncology History   # SEP 2017- Multiple Myeloma lytic lesions- Thoracic spine/ vertebral compression fractures; IgG Kappa- 3.1gm/dl; K/L-140. Vel-Rev-   # FTT s/p PEG [Dr. Elliot];   # Abnormal CXR;? 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  male  With New diagnosis of multiple myeloma IgG kappa- multiple bone lesions/anemia Currently on single agent Revlimid is here for follow-up.Patient finished Revlimid 5 mg 1 week approximately 1 week ago.  No diarrhea. Patient is slightly more ambulatory at this time. However continues to be resting most of the time. Continues use his PEG tube. No PEG tube malfunction. No fever chills. Gets easily tired. Complains of "cold" extremities; and tenderness in the feet.  ROS: A complete 10 point review of system is done which is negative except mentioned above in history of present illness  MEDICAL HISTORY:  Past Medical History:  Diagnosis Date  . Anemia   . Cancer (HCC)    Bone metastasis  . Constipation   . Hearing loss   . Hoarse voice quality   . Liver lesion   . Malaria 2003  . Multiple myeloma ([place]) 03/22/2016   Per Dr. Rogue Bussing on his PET order.  . Tuberculosis 1985    SURGICAL HISTORY: Past Surgical History:  Procedure Laterality Date  . PEG PLACEMENT N/A 05/18/2016   Procedure: PERCUTANEOUS ENDOSCOPIC GASTROSTOMY (PEG) PLACEMENT;  Surgeon: Manya Silvas, MD;  Location: [facility] ENDOSCOPY;  Service: Endoscopy;  Laterality: N/A;  . 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 No smoking or alcohol.  Social History   Social History  .  Marital status: Married    Spouse name: N/A  . Number of children: N/A  . Years of education: N/A   Occupational History  . Not on file.   Social History Main Topics  . Smoking status: Never Smoker  . Smokeless tobacco: Never Used  . Alcohol use No  . Drug use: No  . Sexual activity: Not on file   Other Topics Concern  . Not on file   Social History Narrative  . No narrative on file    FAMILY HISTORY: no history of cancers in the family. Family History  Problem Relation Age of Onset  . Hypertension Mother     ALLERGIES:  has No Known Allergies.  MEDICATIONS:  Current Outpatient Prescriptions  Medication Sig Dispense Refill  . aspirin 81 MG chewable tablet Chew 1 tablet (81 mg total) by mouth daily. 30 tablet 8  . Cholecalciferol (PA VITAMIN D-3) 1000 units tablet Take 1 tablet (1,000 Units total) by mouth daily. 30 tablet 6  . Diaper Rash Products (DESITIN) OINT Apply 1 application topically 3 (three) times daily. Prn diaper rash    . lenalidomide (REVLIMID) 5 MG capsule 1 week on and 1 week off. 7 capsule 6  . Nutritional Supplements (FEEDING SUPPLEMENT, OSMOLITE 1.2 CAL,) LIQD Place 237 mLs into feeding tube continuous. Rate 50 ml/hr    . pantoprazole sodium (PROTONIX) 40 mg/20 mL PACK Place 20 mLs (40 mg total) into feeding tube 2 (two) times daily. 60 mL 6  . vitamin B-12 (CYANOCOBALAMIN) 1000 MCG  tablet Take 1,000 mcg by mouth daily.    . Water For Irrigation, Sterile (FREE WATER) SOLN Place 100 mLs into feeding tube every 6 (six) hours. 500 mL 1  . HYDROcodone-acetaminophen (HYCET) 7.5-325 mg/15 ml solution Place 5 mLs into feeding tube every 6 (six) hours as needed for moderate pain or severe pain. (Patient not taking: Reported on 06/28/2016) 120 mL 0   Current Facility-Administered Medications  Medication Dose Route Frequency Provider Last Rate Last Dose  . ondansetron (ZOFRAN-ODT) disintegrating tablet 8 mg  8 mg Oral Q8H PRN Cammie Sickle, MD           .  PHYSICAL EXAMINATION: ECOG PERFORMANCE STATUS: 3 - Symptomatic, >50% confined to bed  Vitals:   06/28/16 0849  BP: 131/87  Pulse: (!) 111  Resp: 20  Temp: 98.6 F (37 C)   Filed Weights   06/28/16 0849  Weight: 95 lb 4.8 oz (43.2 kg)    GENERAL: Thin built Cachectic-appearing male patient; Alert, no distress and comfortable.   With family. In a wheelchair.  EYES: no pallor or icterus OROPHARYNX: positive for thrush.  NECK: supple, no masses felt LYMPH:  no palpable lymphadenopathy in the cervical, axillary or inguinal regions LUNGS: clear to auscultation and  No wheeze or crackles HEART/CVS: regular rate & rhythm and no murmurs; No lower extremity edema ABDOMEN: abdomen soft, non-tender and normal bowel sounds; PEG tube in place. No leakage noted. Musculoskeletal:no cyanosis of digits and no clubbing  PSYCH: alert & oriented x 3 with fluent speech NEURO: no focal motor/sensory deficits SKIN:  no rashes or significant lesions  LABORATORY DATA:  I have reviewed the data as listed Lab Results  Component Value Date   WBC 5.2 06/28/2016   HGB 11.1 (L) 06/28/2016   HCT 33.7 (L) 06/28/2016   MCV 86.8 06/28/2016   PLT 275 06/28/2016    Recent Labs  06/07/16 1347  06/18/16 1200 06/21/16 1131 06/28/16 0822  NA 127*  < > 134* 130* 132*  K 4.0  < > 3.8 3.3* 3.5  CL 94*  < > 101 95* 97*  CO2 25  < > _0 GLUCOSE 114*  < > 101* 113* 107*  BUN 16  < > _1 CREATININE 0.49*  < > 0.55* 0.53* 0.50*  CALCIUM 8.3*  < > 8.5* 8.2* 8.7*  GFRNONAA >60  < > >60 >60 >60  GFRAA >60  < > >60 >60 >60  PROT 6.3*  --   --  6.1* 6.6  ALBUMIN 2.7*  --   --  2.9* 3.0*  AST 20  --   --  26 18  ALT 21  --   --  27 18  ALKPHOS 143*  --   --  105 86  BILITOT 0.4  --   --  0.6 0.5  < > = values in this interval not displayed.  RADIOGRAPHIC STUDIES: I have personally reviewed the radiological images as listed and agreed with the findings in the report. Dg Abd 1  View  Result Date: 06/18/2016 CLINICAL DATA:  Gastrostomy tube placement check EXAM: ABDOMEN - 1 VIEW COMPARISON:  04/29/2016 FINDINGS: There is contrast medium in the stomach and proximal duodenum. There is also contrast in the gastrostomy tube and the time of imaging. Left lateral rib fractures, at least 1 of which may be acute. Mild prominence of stool in the rectum. Lower thoracic compression fractures. Indistinct lytic lesions including the right sacrum and  upper portion of the L4 vertebral body compatible with multiple myeloma. IMPRESSION: 1. Gastrostomy tube injection fills the stomach with contrast. There is also contrast in the G-tube. 2. Findings of multiple myeloma. 3. Left lateral rib fractures, at least 1 of which appears to be acute or subacute. 4. Mild prominence of stool in the rectum. Electronically Signed   By: Van Clines M.D.   On: 06/18/2016 12:24   Results for Francisco [name], Francisco [name] (MRN [id number]) as of 05/24/2016 14:01  Ref. Range 03/16/2016 14:59 04/27/2016 18:53  Kappa free light chain Latest Ref Range: 3.3 - 19.4 mg/L 1,005.1 (H) 44.4 (H)  Lamda free light chains Latest Ref Range: 5.7 - 26.3 mg/L 7.1 5.4 (L)  Kappa, lamda light chain ratio Latest Ref Range: 0.26 - 1.65  141.56 (H) 8.22 (H)   Results for Francisco [name], Francisco [name] (MRN [id number]) as of 05/24/2016 14:01  Ref. Range 03/16/2016 14:59 04/27/2016 18:53  M Protein SerPl Elph-Mcnc Latest Ref Range: Not Observed g/dL 3.1 (H) 0.8 (H)   Results for Francisco [name], Francisco [name] (MRN [id number]) as of 06/14/2016 09:05  Ref. Range 05/20/2016 11:27 05/20/2016 16:40 05/20/2016 20:30 05/21/2016 00:26 05/21/2016 04:09 05/21/2016 07:48 05/21/2016 11:44 05/22/2016 12:30 05/24/2016 13:17 06/07/2016 13:47 06/14/2016 08:25  Kappa free light chain Latest Ref Range: 3.3 - 19.4 mg/L          136.9 (H)   Lamda free light chains Latest Ref Range: 5.7 - 26.3 mg/L          23.4   Kappa, lamda light chain ratio Latest Ref Range: 0.26 - 1.65            5.85 (H)   M Protein SerPl Elph-Mcnc Latest Ref Range: Not Observed g/dL          0.7 (H)     ASSESSMENT & PLAN:   Multiple myeloma in relapse (HCC) Multiple myeloma M protein- 3.1 g/dL; kappa/lambda ratio 140; multiple bone lesions. Status post Velcade/Revlimid- multiple interruptions because of hospitalizations poor tolerance. Most recent- M protein 0.7 g/dL; K/L=.136/23 [slightly up]; recommend starting back on Velcade next week. New prescription for Revlimid 5 mg 1 week ON and one week off was given. Re-start revlimid today/ tomorrow x1 week- ON & 1 week OFF.   # B12 deficiency- recommend B12 injections; starting today.  # Gastritis improved. Continue Protonix.  #  Anemia hemoglobin 11.5 improving/Stable.   # Hoarseness of voice/difficulty swallowing- declines ENT evaluation.  # Debility- continue physical therapy at home; slight improvement noted.  # Recommend follow-up in 2 week CBC CMP/myeloma panel; Velcade injection.  All questions were answered. The patient knows to call the clinic with any problems, questions or concerns.    Cammie Sickle, MD 06/28/2016 3:42 PM

## 2016-06-28 NOTE — Patient Instructions (Signed)
Cyanocobalamin, Vitamin B12 injection What is this medicine? CYANOCOBALAMIN (sye an oh koe BAL a min) is a man made form of vitamin B12. Vitamin B12 is used in the growth of healthy blood cells, nerve cells, and proteins in the body. It also helps with the metabolism of fats and carbohydrates. This medicine is used to treat people who can not absorb vitamin B12. This medicine may be used for other purposes; ask your health care provider or pharmacist if you have questions. COMMON BRAND NAME(S): B-12 Compliance Kit, B-12 Injection Kit, Cyomin, LA-12, Nutri-Twelve, Physicians EZ Use B-12, Primabalt What should I tell my health care provider before I take this medicine? They need to know if you have any of these conditions: -kidney disease -Leber's disease -megaloblastic anemia -an unusual or allergic reaction to cyanocobalamin, cobalt, other medicines, foods, dyes, or preservatives -pregnant or trying to get pregnant -breast-feeding How should I use this medicine? This medicine is injected into a muscle or deeply under the skin. It is usually given by a health care professional in a clinic or doctor's office. However, your doctor may teach you how to inject yourself. Follow all instructions. Talk to your pediatrician regarding the use of this medicine in children. Special care may be needed. Overdosage: If you think you have taken too much of this medicine contact a poison control center or emergency room at once. NOTE: This medicine is only for you. Do not share this medicine with others. What if I miss a dose? If you are given your dose at a clinic or doctor's office, call to reschedule your appointment. If you give your own injections and you miss a dose, take it as soon as you can. If it is almost time for your next dose, take only that dose. Do not take double or extra doses. What may interact with this medicine? -colchicine -heavy alcohol intake This list may not describe all possible  interactions. Give your health care provider a list of all the medicines, herbs, non-prescription drugs, or dietary supplements you use. Also tell them if you smoke, drink alcohol, or use illegal drugs. Some items may interact with your medicine. What should I watch for while using this medicine? Visit your doctor or health care professional regularly. You may need blood work done while you are taking this medicine. You may need to follow a special diet. Talk to your doctor. Limit your alcohol intake and avoid smoking to get the best benefit. What side effects may I notice from receiving this medicine? Side effects that you should report to your doctor or health care professional as soon as possible: -allergic reactions like skin rash, itching or hives, swelling of the face, lips, or tongue -blue tint to skin -chest tightness, pain -difficulty breathing, wheezing -dizziness -red, swollen painful area on the leg Side effects that usually do not require medical attention (report to your doctor or health care professional if they continue or are bothersome): -diarrhea -headache This list may not describe all possible side effects. Call your doctor for medical advice about side effects. You may report side effects to FDA at 1-800-FDA-1088. Where should I keep my medicine? Keep out of the reach of children. Store at room temperature between 15 and 30 degrees C (59 and 85 degrees F). Protect from light. Throw away any unused medicine after the expiration date. NOTE: This sheet is a summary. It may not cover all possible information. If you have questions about this medicine, talk to your doctor, pharmacist, or  health care provider.  2017 Elsevier/Gold Standard (2007-10-08 22:10:20)

## 2016-06-29 ENCOUNTER — Other Ambulatory Visit: Payer: Self-pay | Admitting: *Deleted

## 2016-06-29 DIAGNOSIS — C9002 Multiple myeloma in relapse: Secondary | ICD-10-CM

## 2016-06-29 LAB — KAPPA/LAMBDA LIGHT CHAINS
KAPPA, LAMDA LIGHT CHAIN RATIO: 5.85 — AB (ref 0.26–1.65)
Kappa free light chain: 142.7 mg/L — ABNORMAL HIGH (ref 3.3–19.4)
LAMDA FREE LIGHT CHAINS: 24.4 mg/L (ref 5.7–26.3)

## 2016-06-29 MED ORDER — LENALIDOMIDE 5 MG PO CAPS: ORAL_CAPSULE | ORAL | 6 refills | Status: DC

## 2016-06-30 ENCOUNTER — Ambulatory Visit: Payer: Medicare Other | Admitting: General Surgery

## 2016-07-05 ENCOUNTER — Telehealth: Payer: Self-pay | Admitting: *Deleted

## 2016-07-05 NOTE — Telephone Encounter (Signed)
Daughter - Allena Katz called to clarify if asa and protonix could be continued. I reinforced education for daughter and asked daughter to proceed with RFs. Daughter states the rx bottle has 7 RFs left. I asked daughter to contact pharmacy to RF.    Daughter states that on 07/11/16 apt - she will bring the information (address and fax number)/ clinic name  to cancer center regarding transferring her father to a cancer ctr in [place].  Pt will sign [employer] release form for medical records at next apt.

## 2016-07-06 ENCOUNTER — Telehealth: Payer: Self-pay | Admitting: *Deleted

## 2016-07-06 NOTE — Telephone Encounter (Signed)
daughter-Anjana contacted cancer ctr with questions regarding if her father should be taking oral potassium. On 12/12 visit, potassium level was slightly low and Dr. B had asked pt to take potassium supplement lqd oral. I explained that the potassium level was normal at 3.5 at last visit. Asking for RF on oral potassium if needed. At this time, I asked pt's daughter to hold off on oral potassium at this time. MD will recheck potassium at next visit.

## 2016-07-12 ENCOUNTER — Inpatient Hospital Stay: Payer: Medicare Other

## 2016-07-12 ENCOUNTER — Inpatient Hospital Stay: Payer: Medicare Other | Attending: Internal Medicine | Admitting: Internal Medicine

## 2016-07-12 VITALS — BP 124/81 | HR 114 | Temp 98.5°F | Wt 99.0 lb

## 2016-07-12 DIAGNOSIS — Z8611 Personal history of tuberculosis: Secondary | ICD-10-CM

## 2016-07-12 DIAGNOSIS — C9002 Multiple myeloma in relapse: Secondary | ICD-10-CM

## 2016-07-12 DIAGNOSIS — R627 Adult failure to thrive: Secondary | ICD-10-CM | POA: Diagnosis not present

## 2016-07-12 DIAGNOSIS — Z79899 Other long term (current) drug therapy: Secondary | ICD-10-CM | POA: Diagnosis not present

## 2016-07-12 DIAGNOSIS — Z8613 Personal history of malaria: Secondary | ICD-10-CM | POA: Insufficient documentation

## 2016-07-12 DIAGNOSIS — K2970 Gastritis, unspecified, without bleeding: Secondary | ICD-10-CM | POA: Insufficient documentation

## 2016-07-12 DIAGNOSIS — R490 Dysphonia: Secondary | ICD-10-CM | POA: Diagnosis not present

## 2016-07-12 DIAGNOSIS — K5900 Constipation, unspecified: Secondary | ICD-10-CM | POA: Insufficient documentation

## 2016-07-12 DIAGNOSIS — D649 Anemia, unspecified: Secondary | ICD-10-CM

## 2016-07-12 DIAGNOSIS — Z5111 Encounter for antineoplastic chemotherapy: Secondary | ICD-10-CM | POA: Insufficient documentation

## 2016-07-12 DIAGNOSIS — E538 Deficiency of other specified B group vitamins: Secondary | ICD-10-CM | POA: Diagnosis not present

## 2016-07-12 DIAGNOSIS — Z7982 Long term (current) use of aspirin: Secondary | ICD-10-CM

## 2016-07-12 DIAGNOSIS — S2242XA Multiple fractures of ribs, left side, initial encounter for closed fracture: Secondary | ICD-10-CM | POA: Diagnosis not present

## 2016-07-12 DIAGNOSIS — Z931 Gastrostomy status: Secondary | ICD-10-CM | POA: Diagnosis not present

## 2016-07-12 LAB — CBC WITH DIFFERENTIAL/PLATELET
BASOS PCT: 1 %
Basophils Absolute: 0 10*3/uL (ref 0–0.1)
EOS ABS: 0 10*3/uL (ref 0–0.7)
Eosinophils Relative: 1 %
HCT: 33.8 % — ABNORMAL LOW (ref 40.0–52.0)
HEMOGLOBIN: 11.1 g/dL — AB (ref 13.0–18.0)
Lymphocytes Relative: 28 %
Lymphs Abs: 1.6 10*3/uL (ref 1.0–3.6)
MCH: 28.6 pg (ref 26.0–34.0)
MCHC: 33 g/dL (ref 32.0–36.0)
MCV: 86.9 fL (ref 80.0–100.0)
Monocytes Absolute: 0.8 10*3/uL (ref 0.2–1.0)
Monocytes Relative: 15 %
NEUTROS PCT: 57 %
Neutro Abs: 3.2 10*3/uL (ref 1.4–6.5)
Platelets: 212 10*3/uL (ref 150–440)
RBC: 3.89 MIL/uL — AB (ref 4.40–5.90)
RDW: 15.5 % — ABNORMAL HIGH (ref 11.5–14.5)
WBC: 5.7 10*3/uL (ref 3.8–10.6)

## 2016-07-12 LAB — COMPREHENSIVE METABOLIC PANEL
ALK PHOS: 96 U/L (ref 38–126)
ALT: 24 U/L (ref 17–63)
AST: 24 U/L (ref 15–41)
Albumin: 3.3 g/dL — ABNORMAL LOW (ref 3.5–5.0)
Anion gap: 7 (ref 5–15)
BUN: 18 mg/dL (ref 6–20)
CALCIUM: 8.6 mg/dL — AB (ref 8.9–10.3)
CHLORIDE: 99 mmol/L — AB (ref 101–111)
CO2: 26 mmol/L (ref 22–32)
CREATININE: 0.59 mg/dL — AB (ref 0.61–1.24)
Glucose, Bld: 108 mg/dL — ABNORMAL HIGH (ref 65–99)
Potassium: 4.3 mmol/L (ref 3.5–5.1)
Sodium: 132 mmol/L — ABNORMAL LOW (ref 135–145)
Total Bilirubin: 0.4 mg/dL (ref 0.3–1.2)
Total Protein: 6.9 g/dL (ref 6.5–8.1)

## 2016-07-12 MED ORDER — BORTEZOMIB CHEMO SQ INJECTION 3.5 MG (2.5MG/ML)
1.3000 mg/m2 | Freq: Once | INTRAMUSCULAR | Status: AC
  Administered 2016-07-12: 2 mg via SUBCUTANEOUS
  Filled 2016-07-12: qty 0.8

## 2016-07-12 NOTE — Progress Notes (Signed)
No steroids prior to velcade injection  due to patients Gastritis per Dr Rogue Bussing.

## 2016-07-12 NOTE — Progress Notes (Signed)
[place] NOTE  Patient Care Team: Alfonse Flavors, MD as PCP - General (Family Medicine) Seeplaputhur Robinette Haines, MD (General Surgery) Leonie Green, MD as Referring Physician (Surgery)  Ms. Francisco [name]; [facility]  CHIEF COMPLAINTS/PURPOSE OF CONSULTATION:  Multiple lytic lesions  #  Oncology History   # SEP 2017- Multiple Myeloma lytic lesions- Thoracic spine/ vertebral compression fractures; IgG Kappa- 3.1gm/dl; K/L-140. Vel-Rev-   # FTT s/p PEG [Dr. Elliot];   # Abnormal CXR;? TB no treatment- chronic     Multiple myeloma in relapse (HCC)     HISTORY OF PRESENTING ILLNESS:  Francisco [name] 71 y.o.  male  With Diagnosis of multiple myeloma IgG kappa- multiple bone lesions/anemia Currently on single agent Revlimid-Velcade weekly is here for follow-up. patient is currently on Revlimid 5 mg 1 week on 1 week off.  No diarrhea. As per the family patient has been more ambulatory. He is putting on weight. He is able to swallow better. He continues to use the feeding tube. Complains of "cold" extremities; and tenderness in the feet. Otherwise no fevers or chills. As per the family patient having episodes of mild epigastric discomfort; belching. He has been taking the Protonix once a day.  ROS: A complete 10 point review of system is done which is negative except mentioned above in history of present illness  MEDICAL HISTORY:  Past Medical History:  Diagnosis Date  . Anemia   . Cancer (HCC)    Bone metastasis  . Constipation   . Hearing loss   . Hoarse voice quality   . Liver lesion   . Malaria 2003  . Multiple myeloma ([place]) 03/22/2016   Per Dr. Rogue Bussing on his PET order.  . Tuberculosis 1985    SURGICAL HISTORY: Past Surgical History:  Procedure Laterality Date  . PEG PLACEMENT N/A 05/18/2016   Procedure: PERCUTANEOUS ENDOSCOPIC GASTROSTOMY (PEG) PLACEMENT;  Surgeon: Manya Silvas, MD;  Location: [facility] ENDOSCOPY;  Service:  Endoscopy;  Laterality: N/A;  . 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 No smoking or alcohol.  Social History   Social History  . Marital status: Married    Spouse name: N/A  . Number of children: N/A  . Years of education: N/A   Occupational History  . Not on file.   Social History Main Topics  . Smoking status: Never Smoker  . Smokeless tobacco: Never Used  . Alcohol use No  . Drug use: No  . Sexual activity: Not on file   Other Topics Concern  . Not on file   Social History Narrative  . No narrative on file    FAMILY HISTORY: no history of cancers in the family. Family History  Problem Relation Age of Onset  . Hypertension Mother     ALLERGIES:  has No Known Allergies.  MEDICATIONS:  Current Outpatient Prescriptions  Medication Sig Dispense Refill  . aspirin 81 MG chewable tablet Chew 1 tablet (81 mg total) by mouth daily. 30 tablet 8  . Cholecalciferol (PA VITAMIN D-3) 1000 units tablet Take 1 tablet (1,000 Units total) by mouth daily. 30 tablet 6  . Diaper Rash Products (DESITIN) OINT Apply 1 application topically 3 (three) times daily. Prn diaper rash    . HYDROcodone-acetaminophen (HYCET) 7.5-325 mg/15 ml solution Place 5 mLs into feeding tube every 6 (six) hours as needed for moderate pain or severe pain. 120 mL 0  . lenalidomide (REVLIMID)  5 MG capsule 1 week on and 1 week off. 7 capsule 6  . Nutritional Supplements (FEEDING SUPPLEMENT, OSMOLITE 1.2 CAL,) LIQD Place 237 mLs into feeding tube continuous. Rate 50 ml/hr    . pantoprazole sodium (PROTONIX) 40 mg/20 mL PACK Place 20 mLs (40 mg total) into feeding tube 2 (two) times daily. 60 mL 6  . vitamin B-12 (CYANOCOBALAMIN) 1000 MCG tablet Take 1,000 mcg by mouth daily.    . Water For Irrigation, Sterile (FREE WATER) SOLN Place 100 mLs into feeding tube every 6 (six) hours. 500 mL 1   Current Facility-Administered  Medications  Medication Dose Route Frequency Provider Last Rate Last Dose  . ondansetron (ZOFRAN-ODT) disintegrating tablet 8 mg  8 mg Oral Q8H PRN Cammie Sickle, MD          .  PHYSICAL EXAMINATION: ECOG PERFORMANCE STATUS: 3 - Symptomatic, >50% confined to bed  Vitals:   07/12/16 1441  BP: 124/81  Pulse: (!) 114  Temp: 98.5 F (36.9 C)   Filed Weights   07/12/16 1441  Weight: 99 lb (44.9 kg)    GENERAL: Thin built Cachectic-appearing male patient; Alert, no distress and comfortable.   With family. In a wheelchair.  EYES: no pallor or icterus OROPHARYNX: positive for thrush.  NECK: supple, no masses felt LYMPH:  no palpable lymphadenopathy in the cervical, axillary or inguinal regions LUNGS: clear to auscultation and  No wheeze or crackles HEART/CVS: regular rate & rhythm and no murmurs; No lower extremity edema ABDOMEN: abdomen soft, non-tender and normal bowel sounds; PEG tube in place. No leakage noted. Musculoskeletal:no cyanosis of digits and no clubbing  PSYCH: alert & oriented x 3 with fluent speech NEURO: no focal motor/sensory deficits SKIN:  no rashes or significant lesions  LABORATORY DATA:  I have reviewed the data as listed Lab Results  Component Value Date   WBC 5.7 07/12/2016   HGB 11.1 (L) 07/12/2016   HCT 33.8 (L) 07/12/2016   MCV 86.9 07/12/2016   PLT 212 07/12/2016    Recent Labs  06/21/16 1131 06/28/16 0822 07/12/16 1405  NA 130* 132* 132*  K 3.3* 3.5 4.3  CL 95* 97* 99*  CO2 28 28 26   GLUCOSE 113* 107* 108*  BUN 13 15 18   CREATININE 0.53* 0.50* 0.59*  CALCIUM 8.2* 8.7* 8.6*  GFRNONAA >60 >60 >60  GFRAA >60 >60 >60  PROT 6.1* 6.6 6.9  ALBUMIN 2.9* 3.0* 3.3*  AST 26 18 24   ALT 27 18 24   ALKPHOS 105 86 96  BILITOT 0.6 0.5 0.4    RADIOGRAPHIC STUDIES: I have personally reviewed the radiological images as listed and agreed with the findings in the report. Dg Abd 1 View  Result Date: 06/18/2016 CLINICAL DATA:   Gastrostomy tube placement check EXAM: ABDOMEN - 1 VIEW COMPARISON:  04/29/2016 FINDINGS: There is contrast medium in the stomach and proximal duodenum. There is also contrast in the gastrostomy tube and the time of imaging. Left lateral rib fractures, at least 1 of which may be acute. Mild prominence of stool in the rectum. Lower thoracic compression fractures. Indistinct lytic lesions including the right sacrum and upper portion of the L4 vertebral body compatible with multiple myeloma. IMPRESSION: 1. Gastrostomy tube injection fills the stomach with contrast. There is also contrast in the G-tube. 2. Findings of multiple myeloma. 3. Left lateral rib fractures, at least 1 of which appears to be acute or subacute. 4. Mild prominence of stool in the rectum. Electronically Signed  By: Van Clines M.D.   On: 06/18/2016 12:24   Results for Francisco [name] (MRN [id number]) as of 07/12/2016 11:06  Ref. Range 03/23/2016 14:31 04/12/2016 14:00 05/20/2016 15:00 06/14/2016 10:51 07/12/2016 10:23  Kappa free light chain Latest Ref Range: 3.3 - 19.4 mg/L  39.9 (H)  44.2 (H)   Lamda free light chains Latest Ref Range: 5.7 - 26.3 mg/L  22.1  21.9   Kappa, lamda light chain ratio Latest Ref Range: 0.26 - 1.65   1.81 (H)  2.02 (H)      ASSESSMENT & PLAN:   Multiple myeloma in relapse (HCC) Multiple myeloma M protein- 3.1 g/dL; kappa/lambda ratio 140; multiple bone lesions. Status post Velcade/Revlimid- multiple interruptions because of hospitalizations poor tolerance. Most recent- M protein 0.7 g/dL; K/L=.44/23. Continue weekly Velcade subcutaneous; and also Revlimid 5 mg 1 week on and one-week off. Significant clinical improvement noted in the last few weeks. Continue current plan.  # B12 deficiency- B12 injections  q monthly [next due on jan 14th].   # Gastritis-continue to hold steroids. Slightly worse. Recommend increasing to Protonix BID  #  Anemia hemoglobin 11.5 improving/Stable.   # Hoarseness of  voice/difficulty swallowing-improved. Continue PEG tube; and also oral feeds.  # Debility- continue physical therapy at home; improvement noted.  # Recommend follow-up in 1 week CBC CMP/myeloma panel; Velcade injection.   # Family interested in relocating to [place]. Awaiting communication from [place]; will fax the records once available.   All questions were answered. The patient knows to call the clinic with any problems, questions or concerns.    Cammie Sickle, MD 07/12/2016 5:13 PM

## 2016-07-12 NOTE — Progress Notes (Signed)
Patient here today for follow up.  Patient is concerned about reflux described as a "foam".  Patient would like to increase feeding supplement from 4 to 5 daily, has increased speed from 45 to 50.

## 2016-07-12 NOTE — Assessment & Plan Note (Addendum)
Multiple myeloma M protein- 3.1 g/dL; kappa/lambda ratio 140; multiple bone lesions. Status post Velcade/Revlimid- multiple interruptions because of hospitalizations poor tolerance. Most recent- M protein 0.7 g/dL; K/L=.44/23. Continue weekly Velcade subcutaneous; and also Revlimid 5 mg 1 week on and one-week off. Significant clinical improvement noted in the last few weeks. Continue current plan.  # B12 deficiency- B12 injections  q monthly [next due on jan 14th].   # Gastritis-continue to hold steroids. Slightly worse. Recommend increasing to Protonix BID  #  Anemia hemoglobin 11.5 improving/Stable.   # Hoarseness of voice/difficulty swallowing-improved. Continue PEG tube; and also oral feeds.  # Debility- continue physical therapy at home; improvement noted.  # Recommend follow-up in 1 week CBC CMP/myeloma panel; Velcade injection.   # Family interested in relocating to [place]. Awaiting communication from [place]; will fax the records once available.

## 2016-07-13 ENCOUNTER — Ambulatory Visit (INDEPENDENT_AMBULATORY_CARE_PROVIDER_SITE_OTHER): Payer: Medicare Other | Admitting: General Surgery

## 2016-07-13 ENCOUNTER — Encounter: Payer: Self-pay | Admitting: General Surgery

## 2016-07-13 VITALS — BP 110/68 | HR 64 | Resp 18 | Ht 66.0 in | Wt 99.0 lb

## 2016-07-13 DIAGNOSIS — C9000 Multiple myeloma not having achieved remission: Secondary | ICD-10-CM

## 2016-07-13 DIAGNOSIS — R634 Abnormal weight loss: Secondary | ICD-10-CM

## 2016-07-13 NOTE — Patient Instructions (Addendum)
The patient is aware to call back for any questions or concerns.   May use Tums as needed for reflux or indigestion  May increase day feedings gradually to 60 ml/hour and may hold feedings at night if he desires He will gradually transition to bolus feeds.

## 2016-07-13 NOTE — Progress Notes (Signed)
Patient ID: Francisco [name], male   DOB: 21-Feb-1946, 71 y.o.   MRN: [id number]  Chief Complaint  Patient presents with  . Follow-up    HPI Francisco [name] is a 71 y.o. male.  Here today for follow up PEG tube. He states he is having some vomiting for the past 3 days and burning sensation on the left side of his abdomen. He states the feeding tube is functioning well. He is also supplementing with oral intake. I have reviewed the history of present illness with the patient.  HPI  Past Medical History:  Diagnosis Date  . Anemia   . Cancer (HCC)    Bone metastasis  . Constipation   . Hearing loss   . Hoarse voice quality   . Liver lesion   . Malaria 2003  . Multiple myeloma ([place]) 03/22/2016   Per Dr. Rogue Bussing on his PET order.  . Tuberculosis 1985    Past Surgical History:  Procedure Laterality Date  . PEG PLACEMENT N/A 05/18/2016   Procedure: PERCUTANEOUS ENDOSCOPIC GASTROSTOMY (PEG) PLACEMENT;  Surgeon: Manya Silvas, MD;  Location: [facility] ENDOSCOPY;  Service: Endoscopy;  Laterality: N/A;  . RETINAL DETACHMENT SURGERY  2006    Family History  Problem Relation Age of Onset  . Hypertension Mother     Social History Social History  Substance Use Topics  . Smoking status: Never Smoker  . Smokeless tobacco: Never Used  . Alcohol use No    No Known Allergies  Current Outpatient Prescriptions  Medication Sig Dispense Refill  . aspirin 81 MG chewable tablet Chew 1 tablet (81 mg total) by mouth daily. 30 tablet 8  . Cholecalciferol (PA VITAMIN D-3) 1000 units tablet Take 1 tablet (1,000 Units total) by mouth daily. 30 tablet 6  . Diaper Rash Products (DESITIN) OINT Apply 1 application topically 3 (three) times daily. Prn diaper rash    . HYDROcodone-acetaminophen (HYCET) 7.5-325 mg/15 ml solution Place 5 mLs into feeding tube every 6 (six) hours as needed for moderate pain or severe pain. 120 mL 0  . lenalidomide (REVLIMID) 5 MG capsule 1 week on and 1 week off.  7 capsule 6  . Nutritional Supplements (FEEDING SUPPLEMENT, OSMOLITE 1.2 CAL,) LIQD Place 237 mLs into feeding tube continuous. Rate 50 ml/hr    . pantoprazole sodium (PROTONIX) 40 mg/20 mL PACK Place 20 mLs (40 mg total) into feeding tube 2 (two) times daily. 60 mL 6  . vitamin B-12 (CYANOCOBALAMIN) 1000 MCG tablet Take 1,000 mcg by mouth daily.    . Water For Irrigation, Sterile (FREE WATER) SOLN Place 100 mLs into feeding tube every 6 (six) hours. 500 mL 1   Current Facility-Administered Medications  Medication Dose Route Frequency Provider Last Rate Last Dose  . ondansetron (ZOFRAN-ODT) disintegrating tablet 8 mg  8 mg Oral Q8H PRN Cammie Sickle, MD        Review of Systems Review of Systems  Constitutional: Negative.   Respiratory: Negative.   Cardiovascular: Negative.   Gastrointestinal: Positive for vomiting.    Blood pressure 110/68, pulse 64, resp. rate 18, height 5' 6"  (1.676 m), weight 99 lb (44.9 kg).  Physical Exam Physical Exam  Constitutional: He is oriented to person, place, and time. He appears well-developed and well-nourished.  Neurological: He is alert and oriented to person, place, and time.  Skin: Skin is warm and dry.  Psychiatric: His behavior is normal.  PEG tube is intact. No leakage noted. 2 remaining anchoring buttons were removed today. Abdomen  is soft, nontender.  Data Reviewed  Prior notes   Assessment    Stable gastrostomy tube.    Plan       May use Tums as needed for reflux or indigestion May increase day feedings gradually to 60 ml/hour and may hold feedings at night if he desires. He will gradually transition to bolus feeds. Follow up as needed. The patient is aware to call back for any questions or concerns.   This information has been scribed by Karie Fetch RN, BSN,BC.   Alexanderia Gorby G 07/14/2016, 11:36 AM

## 2016-07-14 ENCOUNTER — Other Ambulatory Visit: Payer: Self-pay | Admitting: *Deleted

## 2016-07-14 ENCOUNTER — Telehealth: Payer: Self-pay | Admitting: Internal Medicine

## 2016-07-14 ENCOUNTER — Other Ambulatory Visit: Payer: Self-pay

## 2016-07-14 DIAGNOSIS — C9002 Multiple myeloma in relapse: Secondary | ICD-10-CM

## 2016-07-14 DIAGNOSIS — Z7689 Persons encountering health services in other specified circumstances: Secondary | ICD-10-CM

## 2016-07-14 MED ORDER — LENALIDOMIDE 5 MG PO CAPS: 5.0000 mg | ORAL_CAPSULE | Freq: Every day | ORAL | 6 refills | Status: DC

## 2016-07-14 MED ORDER — LENALIDOMIDE 5 MG PO CAPS: ORAL_CAPSULE | ORAL | 6 refills | Status: DC

## 2016-07-14 NOTE — Telephone Encounter (Addendum)
Daughter Montez Morita is leaving for [place] today at [place]. Please call her before 2pm. She wants to give you some information for contacting the medical center in [place] and she also had questions about a survey. Please call her at: [phone]. Thanks.

## 2016-07-14 NOTE — Telephone Encounter (Signed)
Patient's daughter called wanting to provide information to our office to fax pt's records to [facility],  fax number [phone]; phone - [phone], attn: Dr. Margarette Asal.  Per daughter, this is a primary care provider.  I expressed concerned about establishing with oncologist not a PCP. I explained that the pcp would not be able to prescribe the revlmid and pt needs to establish f/u with oncologist asap.  I reiterated that it is important to not have any delays in pt's care. The records will need to be reviewed by oncologist.  Daughter states that her brother was only able to make an apt with the pcp Dr. Barbie Haggis on 07/25/16. Since pt has [place] medicaid, [place] will not accept this insurance and they are unable to make a referral to oncologist without establishing with pcp first in [place]. She states that she "confident that an apt with oncology can be obtained possible the same week" that the pt establishes with pcp. She plans to fly her father back to [place] on 1/13. Her brother will provide our office with the name of the oncologist as soon as possible. I asked daughter if she could send this information through [facility] said she would do that or either give our office a call back.  I also explained that pt needs to sign the consent form for release of information to the offices. She asked that once a name is provided that Dr. Rogue Bussing make a 'formal' referral to the oncologist in [place].  Daughter requested phone number for [facility]. This was provided to daughter to call. She helped the patient perform his Perscriber survey on Tuesday night for his revlimid.  Daughter states - pt has a 'cold' and coughing up some yellow mucus. Pt denies any fever or chills.  She will have her father try "over the counter claritin or zyrtec if needed."  Advised daughter that if fever occurs then to call our office back. MD will not tx with antibiotics at this time.  Daughter also spoke with Joli -  clinical nutritionist today to manage pt's feeding tube recommendations.  RN spent approximately 25 mins. on phone with daughter discussing pt's care.

## 2016-07-14 NOTE — Progress Notes (Signed)
Nutrition Follow-up:  Spoke with daughter Angina via phone this am to follow-up on tube feeding tolerance. Daughter reports patient for the last 3 days has been having foaming like liquid, cough. Has already discussed with RN, Nira Conn via phone earlier this am.  Encouraged daughter to make sure he is sitting up at least 30 degrees while tube feeding is running to prevent risk of aspiration.  Reports tube feeding is running mostly at 45 ml/hr at times increases rate to 65ml/hr per daughter.  Patient does complain at times of nausea like feeling, no report of diarrhea.  Daughter reports gives free water flush of at least 161ml 3-4 times per day or more pending intake of liquids via mouth.  Daughter reports patient is eating more typically for breakfast has 1/4 cup soup (broth base carrot and beet), quinoa, nuts, at lunch time 1/4 cup soup (broth base lentil or other bean), rice, afternoon snack of ensure (4 oz) and nuts then evening meal of rice around 7:30pm.    No diarrhea reported  Noted weight increase to 99 pounds as on 12/12 weight of 95 pounds.    Medications: reviewed  Labs: Na 132, glucose 108   Estimated Energy Needs  Kcals: 1350-1500 kcals/d with new wt  Protein: 54-68 g/d Fluid: 1.5 L/d  NUTRITION DIAGNOSIS: Inadequate oral intake continues as evidenced by tube feeding continuing    INTERVENTION:   Discussed with daughter to continue tube feeding at 70ml/hr of osmolite 1.2 to continue to meet nutritional needs and continue weight gain.  Providing 1296 calories, 60 g/d, 834ml of free water. Discussed importance of safe oral intake (sitting up, chewing well, etc) and encouraged daughter to continue oral intake to better meet nutritional needs as tube feeding not meeting all of current nutritional needs.  Also at times daughter reports will turn tube feeding off when coming to appointments or patient will ask for feeding to be stopped. Encouraged intake of ensure as patient has  been tolerating. Daughter also asking about premier protein shake    MONITORING, EVALUATION, GOAL: Patient to tolerate tube feeding and oral intake to promote weight gain and improve strength   NEXT VISIT: as needed  Lorenza Winkleman B. Zenia Resides, [place], [place] (pager)

## 2016-07-14 NOTE — Progress Notes (Signed)
Received phone call to clarify if revlimid is given 1 tablet daily. Needed new rx to reflect this for the cycle. New rx submitted to [facility].

## 2016-07-14 NOTE — Progress Notes (Signed)
[facility] auth # [id number] for revlmid. New RX sent to [facility].

## 2016-07-18 ENCOUNTER — Other Ambulatory Visit: Payer: Self-pay | Admitting: *Deleted

## 2016-07-18 ENCOUNTER — Telehealth: Payer: Self-pay | Admitting: Internal Medicine

## 2016-07-18 DIAGNOSIS — C9002 Multiple myeloma in relapse: Secondary | ICD-10-CM

## 2016-07-18 NOTE — Telephone Encounter (Signed)
He wants to know if his father will get B12 tomorrow or if they need to do oral dosage. He also wants to know if you will have paperwork ready for him tomorrow because they are taking pt to [place] on Saturday. Please call him: [phone]. Thanks.

## 2016-07-19 ENCOUNTER — Encounter: Payer: Self-pay | Admitting: Internal Medicine

## 2016-07-19 ENCOUNTER — Inpatient Hospital Stay: Payer: Medicare Other

## 2016-07-19 ENCOUNTER — Inpatient Hospital Stay (HOSPITAL_BASED_OUTPATIENT_CLINIC_OR_DEPARTMENT_OTHER): Payer: Medicare Other | Admitting: Internal Medicine

## 2016-07-19 DIAGNOSIS — K2970 Gastritis, unspecified, without bleeding: Secondary | ICD-10-CM

## 2016-07-19 DIAGNOSIS — C9002 Multiple myeloma in relapse: Secondary | ICD-10-CM

## 2016-07-19 DIAGNOSIS — S2242XA Multiple fractures of ribs, left side, initial encounter for closed fracture: Secondary | ICD-10-CM

## 2016-07-19 DIAGNOSIS — Z8613 Personal history of malaria: Secondary | ICD-10-CM | POA: Diagnosis not present

## 2016-07-19 DIAGNOSIS — R490 Dysphonia: Secondary | ICD-10-CM | POA: Diagnosis not present

## 2016-07-19 DIAGNOSIS — Z79899 Other long term (current) drug therapy: Secondary | ICD-10-CM | POA: Diagnosis not present

## 2016-07-19 DIAGNOSIS — E538 Deficiency of other specified B group vitamins: Secondary | ICD-10-CM

## 2016-07-19 DIAGNOSIS — D649 Anemia, unspecified: Secondary | ICD-10-CM | POA: Diagnosis not present

## 2016-07-19 DIAGNOSIS — Z7982 Long term (current) use of aspirin: Secondary | ICD-10-CM

## 2016-07-19 DIAGNOSIS — Z8611 Personal history of tuberculosis: Secondary | ICD-10-CM

## 2016-07-19 DIAGNOSIS — Z5111 Encounter for antineoplastic chemotherapy: Secondary | ICD-10-CM | POA: Diagnosis not present

## 2016-07-19 DIAGNOSIS — K5900 Constipation, unspecified: Secondary | ICD-10-CM

## 2016-07-19 DIAGNOSIS — Z931 Gastrostomy status: Secondary | ICD-10-CM

## 2016-07-19 DIAGNOSIS — R627 Adult failure to thrive: Secondary | ICD-10-CM | POA: Diagnosis not present

## 2016-07-19 LAB — BASIC METABOLIC PANEL
ANION GAP: 6 (ref 5–15)
BUN: 21 mg/dL — ABNORMAL HIGH (ref 6–20)
CO2: 28 mmol/L (ref 22–32)
Calcium: 9 mg/dL (ref 8.9–10.3)
Chloride: 100 mmol/L — ABNORMAL LOW (ref 101–111)
Creatinine, Ser: 0.57 mg/dL — ABNORMAL LOW (ref 0.61–1.24)
GFR calc Af Amer: >60 mL/min (ref >60)
GFR calc non Af Amer: >60 mL/min (ref >60)
Glucose, Bld: 97 mg/dL (ref 65–99)
POTASSIUM: 3.9 mmol/L (ref 3.5–5.1)
Sodium: 134 mmol/L — ABNORMAL LOW (ref 135–145)

## 2016-07-19 LAB — CBC WITH DIFFERENTIAL/PLATELET
BASOS ABS: 0 10*3/uL (ref 0–0.1)
Basophils Relative: 0 %
Eosinophils Absolute: 0.1 10*3/uL (ref 0–0.7)
Eosinophils Relative: 2 %
HEMATOCRIT: 33.2 % — AB (ref 40.0–52.0)
Hemoglobin: 10.8 g/dL — ABNORMAL LOW (ref 13.0–18.0)
LYMPHS PCT: 25 %
Lymphs Abs: 1.2 10*3/uL (ref 1.0–3.6)
MCH: 28.2 pg (ref 26.0–34.0)
MCHC: 32.6 g/dL (ref 32.0–36.0)
MCV: 86.5 fL (ref 80.0–100.0)
Monocytes Absolute: 0.5 10*3/uL (ref 0.2–1.0)
Monocytes Relative: 10 %
NEUTROS ABS: 3 10*3/uL (ref 1.4–6.5)
Neutrophils Relative %: 63 %
PLATELETS: 156 10*3/uL (ref 150–440)
RBC: 3.84 MIL/uL — AB (ref 4.40–5.90)
RDW: 15.1 % — ABNORMAL HIGH (ref 11.5–14.5)
WBC: 4.7 10*3/uL (ref 3.8–10.6)

## 2016-07-19 MED ORDER — CYANOCOBALAMIN 1000 MCG/ML IJ SOLN
1000.0000 ug | Freq: Once | INTRAMUSCULAR | Status: AC
  Administered 2016-07-19: 1000 ug via INTRAMUSCULAR
  Filled 2016-07-19: qty 1

## 2016-07-19 MED ORDER — BORTEZOMIB CHEMO SQ INJECTION 3.5 MG (2.5MG/ML)
1.3000 mg/m2 | Freq: Once | INTRAMUSCULAR | Status: AC
  Administered 2016-07-19: 2 mg via SUBCUTANEOUS
  Filled 2016-07-19: qty 2

## 2016-07-19 NOTE — Telephone Encounter (Signed)
This was addressed in the clinic visit today on 07/19/16. B12 injection - will be given today in clinic. Pt will be moving out of town back to [place] on Saturday this week.

## 2016-07-19 NOTE — Progress Notes (Signed)
[place] NOTE  Patient Care Team: Alfonse Flavors, MD as PCP - General (Family Medicine) Seeplaputhur Robinette Haines, MD (General Surgery) Leonie Green, MD as Referring Physician (Surgery)  [name]; [facility]  CHIEF COMPLAINTS/PURPOSE OF CONSULTATION:  Multiple lytic lesions  #  Oncology History   # SEP 2017- Multiple Myeloma lytic lesions- Thoracic spine/ vertebral compression fractures; IgG Kappa- 3.1gm/dl; K/L-140. Vel-Rev-   # FTT s/p PEG [Dr. Elliot];   # Abnormal CXR;? TB no treatment- chronic     Multiple myeloma in relapse (HCC)     HISTORY OF PRESENTING ILLNESS:  Francisco [name] 71 y.o.  male  With Diagnosis of multiple myeloma IgG kappa- multiple bone lesions/anemia Currently on single agent Revlimid-Velcade weekly is here for follow-up. patient is currently on Revlimid 5 mg 1 week on 1 week off.  Patient complains of mild tingling and numbness of his hand and feet. No falls. No diarrhea. As per the family patient has been more ambulatory. He is putting on weight. He is able to swallow better. He continues to use the feeding tube.  Otherwise denies any chest pain. No nausea no vomiting.   ROS: A complete 10 point review of system is done which is negative except mentioned above in history of present illness  MEDICAL HISTORY:  Past Medical History:  Diagnosis Date  . Anemia   . Cancer (HCC)    Bone metastasis  . Constipation   . Hearing loss   . Hoarse voice quality   . Liver lesion   . Malaria 2003  . Multiple myeloma ([place]) 03/22/2016   Per Dr. Rogue Bussing on his PET order.  . Tuberculosis 1985    SURGICAL HISTORY: Past Surgical History:  Procedure Laterality Date  . PEG PLACEMENT N/A 05/18/2016   Procedure: PERCUTANEOUS ENDOSCOPIC GASTROSTOMY (PEG) PLACEMENT;  Surgeon: Manya Silvas, MD;  Location: [facility] ENDOSCOPY;  Service: Endoscopy;  Laterality: N/A;  . 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 No smoking or alcohol.  Social History   Social History  . Marital status: Married    Spouse name: N/A  . Number of children: N/A  . Years of education: N/A   Occupational History  . Not on file.   Social History Main Topics  . Smoking status: Never Smoker  . Smokeless tobacco: Never Used  . Alcohol use No  . Drug use: No  . Sexual activity: Not on file   Other Topics Concern  . Not on file   Social History Narrative  . No narrative on file    FAMILY HISTORY: no history of cancers in the family. Family History  Problem Relation Age of Onset  . Hypertension Mother     ALLERGIES:  has No Known Allergies.  MEDICATIONS:  Current Outpatient Prescriptions  Medication Sig Dispense Refill  . aspirin 81 MG chewable tablet Chew 1 tablet (81 mg total) by mouth daily. 30 tablet 8  . Cholecalciferol (PA VITAMIN D-3) 1000 units tablet Take 1 tablet (1,000 Units total) by mouth daily. 30 tablet 6  . Diaper Rash Products (DESITIN) OINT Apply 1 application topically 3 (three) times daily. Prn diaper rash    . lenalidomide (REVLIMID) 5 MG capsule Take 1 capsule (5 mg total) by mouth daily. 1 week on and 1 week off. 7 capsule 6  . Nutritional Supplements (FEEDING SUPPLEMENT, OSMOLITE 1.2 CAL,) LIQD Place 237 mLs into feeding tube continuous.  Rate 50 ml/hr    . pantoprazole sodium (PROTONIX) 40 mg/20 mL PACK Place 20 mLs (40 mg total) into feeding tube 2 (two) times daily. 60 mL 6  . vitamin B-12 (CYANOCOBALAMIN) 1000 MCG tablet Take 1,000 mcg by mouth daily.    . Water For Irrigation, Sterile (FREE WATER) SOLN Place 100 mLs into feeding tube every 6 (six) hours. 500 mL 1  . HYDROcodone-acetaminophen (HYCET) 7.5-325 mg/15 ml solution Place 5 mLs into feeding tube every 6 (six) hours as needed for moderate pain or severe pain. (Patient not taking: Reported on 07/19/2016) 120 mL 0   Current Facility-Administered  Medications  Medication Dose Route Frequency Provider Last Rate Last Dose  . ondansetron (ZOFRAN-ODT) disintegrating tablet 8 mg  8 mg Oral Q8H PRN Cammie Sickle, MD       Facility-Administered Medications Ordered in Other Visits  Medication Dose Route Frequency Provider Last Rate Last Dose  . bortezomib SQ (VELCADE) chemo injection 2 mg  1.3 mg/m2 (Treatment Plan Recorded) Subcutaneous Once Cammie Sickle, MD      . cyanocobalamin ((VITAMIN B-12)) injection 1,000 mcg  1,000 mcg Intramuscular Once Cammie Sickle, MD          .  PHYSICAL EXAMINATION: ECOG PERFORMANCE STATUS: 3 - Symptomatic, >50% confined to bed  There were no vitals filed for this visit. There were no vitals filed for this visit.  GENERAL: Thin built Cachectic-appearing male patient; Alert, no distress and comfortable.   With family. In a wheelchair.  EYES: no pallor or icterus OROPHARYNX: positive for thrush.  NECK: supple, no masses felt LYMPH:  no palpable lymphadenopathy in the cervical, axillary or inguinal regions LUNGS: clear to auscultation and  No wheeze or crackles HEART/CVS: regular rate & rhythm and no murmurs; No lower extremity edema ABDOMEN: abdomen soft, non-tender and normal bowel sounds; PEG tube in place. No leakage noted. Musculoskeletal:no cyanosis of digits and no clubbing  PSYCH: alert & oriented x 3 with fluent speech NEURO: no focal motor/sensory deficits SKIN:  no rashes or significant lesions  LABORATORY DATA:  I have reviewed the data as listed Lab Results  Component Value Date   WBC 4.7 07/19/2016   HGB 10.8 (L) 07/19/2016   HCT 33.2 (L) 07/19/2016   MCV 86.5 07/19/2016   PLT 156 07/19/2016    Recent Labs  06/21/16 1131 06/28/16 0822 07/12/16 1405 07/19/16 1413  NA 130* 132* 132* 134*  K 3.3* 3.5 4.3 3.9  CL 95* 97* 99* 100*  CO2 _0 GLUCOSE 113* 107* 108* 97  BUN _1 21*  CREATININE 0.53* 0.50* 0.59* 0.57*  CALCIUM 8.2* 8.7* 8.6*  9.0  GFRNONAA >60 >60 >60 >60  GFRAA >60 >60 >60 >60  PROT 6.1* 6.6 6.9  --   ALBUMIN 2.9* 3.0* 3.3*  --   AST _2 --   ALT _3 --   ALKPHOS 105 86 96  --   BILITOT 0.6 0.5 0.4  --     RADIOGRAPHIC STUDIES: I have personally reviewed the radiological images as listed and agreed with the findings in the report. No results found.  Results for FISHER, HARGADON (MRN [id number]) as of 07/19/2016 15:04  Ref. Range 03/16/2016 14:59 04/27/2016 18:53 06/07/2016 13:47 06/28/2016 08:22  Kappa free light chain Latest Ref Range: 3.3 - 19.4 mg/L 1,005.1 (H) 44.4 (H) 136.9 (H) 142.7 (H)  Lamda free light chains Latest Ref Range: 5.7 - 26.3 mg/L  7.1 5.4 (L) 23.4 24.4  Kappa, lamda light chain ratio Latest Ref Range: 0.26 - 1.65  141.56 (H) 8.22 (H) 5.85 (H) 5.85 (H)  M Protein SerPl Elph-Mcnc Latest Ref Range: Not Observed g/dL 3.1 (H) 0.8 (H) 0.7 (H)       ASSESSMENT & PLAN:   Multiple myeloma in relapse (HCC) Multiple myeloma M protein- 3.1 g/dL; kappa/lambda ratio 140; multiple bone lesions. Status post Velcade/Revlimid- multiple interruptions because of hospitalizations poor tolerance. Most recent- M protein 0.7 g/dL; K/L=142/25.  # Continue weekly Velcade subcutaneous; and also Revlimid 5 mg 1 week on and one-week off. Significant clinical improvement noted in the last few months. Recommend discussion patient to further Velcade.  # Further options given the worsening neuropathy include- Options include Kyprolis vs. 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 I think Velcade will have to be discontinued in the future for worsening neuropathy.   # B12 deficiency- B12 injections  q monthly; Will get B12 injection today.  # Gastritis-continue to hold steroids. Continue  Protonix BID  #  Anemia hemoglobin 10.5 improving/Stable.   # Hoarseness of voice/difficulty swallowing-improved.  Continue PEG tube; and also oral feeds.  # Debility- continue physical therapy at home; improvement noted.  # Long discussion with the patient and family regarding not to start Revlimid until evaluated by physician labs. Also recommend talking to physician regarding equipment; physical therapy etc.  # Family interested in relocating to [place]; to PCP- Dr.Kulin Tantod; [email or web address]  # Follow-up based upon return from [place].   All questions were answered. The patient knows to call the clinic with any problems, questions or concerns.    Cammie Sickle, MD 07/19/2016 3:17 PM

## 2016-07-19 NOTE — Progress Notes (Signed)
Records release signed by patient to send records to Dr. Margarette Asal.  Discussed concerns regarding feeding pump and transitioning his care to [place]. Patient and patient's caregiver regarding the need to establish with pcp asap so that continuity of care for feedings are provided. Pt is unable to take the pump from adv. Home care to [place]. He will require new pcp to write orders for feeding pump.  Pt states that Dr. Barbie Haggis will send my records to oncologist office." The oncology apt in [place] has not yet been set up per patient and patient's caregiver.

## 2016-07-19 NOTE — Assessment & Plan Note (Addendum)
Multiple myeloma M protein- 3.1 g/dL; kappa/lambda ratio 140; multiple bone lesions. Status post Velcade/Revlimid- multiple interruptions because of hospitalizations poor tolerance. Most recent- M protein 0.7 g/dL; K/L=142/25.  # Continue weekly Velcade subcutaneous; and also Revlimid 5 mg 1 week on and one-week off. Significant clinical improvement noted in the last few months. Recommend discussion patient to further Velcade.  # Further options given the worsening neuropathy include- Options include Kyprolis vs. 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 I think Velcade will have to be discontinued in the future for worsening neuropathy.   # B12 deficiency- B12 injections  q monthly; Will get B12 injection today.  # Gastritis-continue to hold steroids. Continue  Protonix BID  #  Anemia hemoglobin 10.5 improving/Stable.   # Hoarseness of voice/difficulty swallowing-improved. Continue PEG tube; and also oral feeds.  # Debility- continue physical therapy at home; improvement noted.  # Long discussion with the patient and family regarding not to start Revlimid until evaluated by physician labs. Also recommend talking to physician regarding equipment; physical therapy etc.  # Family interested in relocating to [place]; to PCP- Dr.Kulin Tantod; [email or web address]  # Follow-up based upon return from [place].

## 2016-07-19 NOTE — Progress Notes (Signed)
Patient here today for follow up.  Patient would like to get B12 injection today.

## 2016-07-21 ENCOUNTER — Telehealth: Payer: Self-pay | Admitting: *Deleted

## 2016-07-21 NOTE — Telephone Encounter (Signed)
Patient, son, and caregiver left a total of 7 msgs within 1 hr of each other regarding the patient's status on faxing his medical to [place].  I contacted the patient's personally back at 1427 - Explained that our medical records team fedex these medical records to his pcp's office [place]. These records should arrive by this afternoon in [place].  He thanked me for returning his call.

## 2016-07-22 ENCOUNTER — Telehealth (HOSPITAL_BASED_OUTPATIENT_CLINIC_OR_DEPARTMENT_OTHER): Payer: Self-pay

## 2016-07-22 NOTE — Telephone Encounter (Signed)
Called and spoke with pt's son, Alan [name], regarding message received inquiring if we can help set up home care services, medical bed, and a food pump for his dad? Pt will be a new pt seen for consult on Tues 1/16 with Dr. Brand Males in [facility] who has relocated from [facility]. Let Alan [name] know that Dr. Brand Males has placed a referral to [place] as well as a Nutrition consult to start the ball rolling, but we will be able to further set up care once pt is seen and assessed in clinic. Understanding verbalized, no further questions at time of call.

## 2016-07-25 NOTE — Progress Notes (Signed)
BONE MARROW TRANSPLANT INITIAL CONSULTATION    Reason for Referral: transferring care from [facility]    Referring Physician: Cammie Sickle, MD    Primary Care Physician: Bobette Mo, MD  Date of Visit: 07/26/2016    Myeloma Data at Diagnosis:  Date of diagnosis:   Age: 71  Hgb:   Creat:   Alb:   Calcium:   B2M: 2.5  Bone marrow plasma cell %:   Cytogenetics/FISH:  Skeletal survey:    Free light chains: kappa 1005.1/lambda 7.1; ratio 141.56   SPEP/ M-protein: 3.1  IFE: IgG kappa  R-ISS:  ISS: unknown  [place]:    History of Present Illness:  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  He had an abnl chest X-ray which lead to a chest CT which noted multiple lytic lesions in thoracic vertebrae, as well as compression fractures.  He was referred to Dr. Charlaine Dalton for further evaluation.  He had a bone marrow biopsy that showed MM.  He had significant pain from his spine lesions and had palliative radiation from 03/24/16-04/11/16 from T9-T11 and exophytic masses.  His pain resolved once he finished radiation therapy.      He was then started on Revlimid 20 mg po daily 2 weeks on 1 week off as well as Velcade SQ and dexamethasone    He had several hospitalizations for infection, difficulty with weight gain, eating.  He was placed on TPN and then transitioned to tube feeds by 11/17.      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  At this point, he's off of dexamethasone.  He is taking Revlimid 5 mg po daily 1 week on 1 week off.    Given his significant debilitation and illness, he moved to [facility] to have more support/caregiving.  He and his wife have moved in with their son, [name] in [place]. They have another son and daughter in [place].    He is here in a wheelchair, but at home walks with a walker though cannot manage long distances over 100 steps or so  He is  eating although still supplementing with tube feeds as can only manage some soups/lentils 3x/day    Hospitalizations and significant events:   9/26-10/4: Admitted with N/V and failure to thrive, felt to have gastritis  10/16-10/25: Admitted with aspiration pneumonia; tx with vanc/zosyn/azithro  05/18/16: PEG tube placed  06/18/16: ER visit for abdominal pain at PEG site      Review of Systems:  General: No fever, chills.  +fatigued very easily  Eyes: No icterus, vision change, double vision, eye pain, blurry vision, or floaters.   Oral/throat: No oral pain, oral ulcers or lesions, tooth pain, sore throat. +difficulty swallowing pills especially  Nose: No nasal discharge, sinus pain.   Ears: No change in hearing or ear pain.   Neck: No pain, no masses.  Cardiac: No chest pain or pressure, palpitations, or edema.   Pulmonary: No chest pain, +dyspnea on exertion  Abdomen: No pain, bloating, +nausea several times/week, vomiting, diarrhea, +occasional constipation - takes MOM, melena, or blood per rectum.   Genitourinary: No dysuria, change in urinary frequency.   Extremities: No swelling, pain +numbness in feet, also fingertips.   Skin: No rash, lesions, pruritus.   Neurologic:  No headache, numbness, +general weakness, balance problems.   Psych: Normal mood  Past Medical/Surgical Hx:   1. Multiple myeloma  2. Retinal detachment 2006    Social History:   He is married. He and his wife moved from [place] to the [place] in 2002.  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  Since 2010 he's been very involved with an ashram in [place], [place].  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    He denies EtOH/never smoker/no smokeless tobacco.  He is vegetarian  He has 3 children, 2 sons and 1 daughter    Family History:   He has no known family h/o cancer.  Father died when patient was only 3 mos but he is not aware of cause    Medications:    Current Outpatient Prescriptions:     acyclovir  (ZOVIRAX) 400 MG tablet, Take 1 tablet (400 mg) by mouth 2 times daily., Disp: 60 tablet, Rfl: 3    aspirin 81 MG tablet, Take 81 mg by mouth daily., Disp: , Rfl:     lenalidomide (REVLIMID) 10 MG capsule, Take 1 capsule (10 mg) by mouth daily. Take 1 capsule on days 1-21 out of a 28 day cycle, Disp: 21 capsule, Rfl: 0    pantoprazole (PROTONIX) 40 MG packet, Take 40 mg by mouth daily., Disp: , Rfl:     Allergies:  Review of patient's allergies indicates no known allergies.    Objective:   07/26/16  1029   BP: 112/62   Pulse: 120   Resp: 16   Temp: 97.9 F (36.6 C)   SpO2: 98%       Physical Exam:   Karnofsky performance status: 50  General: Thin, chronically ill appearing male, no distress  HEENT: EOMI, PERRL, sclera anicteric, conjunctiva pink and moist, oral cavity without lesions or ulcers, tonsils normal   Neck: Supple, no lymphadenopathy   Lungs: Clear to auscultation bilaterally, no wheezes, rubs, or rales   Cardiac: No JVD, tachycardic, normal rhythm, normal S1, S2, no murmurs or gallops   Abdomen: Not distended, normal bowel sounds, soft, non-tender, no organomegaly, exam limited in wheelchair as weak to transfer  Extremities: Warm, well perfused, no cyanosis, no clubbing, no edema   Skin: No jaundice, no petechiae, no purpura   Lymph: No lymphadenopathy at cervical, supraclavicular, axillary, or inguinal lymph nodes   Neurologic: Awake, alert, oriented.  +BLE weakness R>L    Lab results:  Lab Results   Component Value Date    WBC 6.8 07/26/2016    RBC 4.10 07/26/2016    HGB 11.6 07/26/2016    HCT 36.3 07/26/2016    MCV 88.5 07/26/2016    MCHC 32.0 07/26/2016    RDW 14.1 07/26/2016    PLT 178 07/26/2016    MPV 11.1 07/26/2016    SEG 66 07/26/2016    LYMPHS 21 07/26/2016    MONOS 12 07/26/2016    EOS 1 07/26/2016     Lab Results   Component Value Date    NA 140 07/26/2016    K 3.6 07/26/2016    CL 102 07/26/2016    BICARB 26 07/26/2016    BUN 17 07/26/2016    CREAT 0.71 07/26/2016    GLU 99 07/26/2016     [place] 9.0 07/26/2016     Lab Results   Component Value Date    AST 24 07/26/2016    ALT 27 07/26/2016    LDH 183 07/26/2016    ALK 96 07/26/2016  TP 7.3 07/26/2016    ALB 3.6 07/26/2016    TBILI 0.44 07/26/2016     OSH Labs:   03/16/2016 04/27/2016 06/07/2016 06/18/2016   kappa 1005.1 44.4 136.9 142.7   lambda 7.1 5.4 23.4 24.4   k/l 141.56 8.22 5.85 5.85   IgG 3817 1132 1390    M-spike 3.1 0.8 0.7        Radiology:  OSH Echo 05/01/16  - Left ventricle: The cavity size was normal. Systolic function was normal. The estimated ejection fraction was 50%. Diffuse  hypokinesis. Doppler parameters are consistent with abnormal leftventricular relaxation (grade 1 diastolic dysfunction).  - Aortic valve: There was mild regurgitation. Valve area (Vmax):1.81 cm^2.  - Mitral valve: There was mild regurgitation.  - Left atrium: The atrium was mildly dilated.  - Atrial septum: No defect or patent foramen ovale was identified.    Impressions:  - Mild LV systolic dysfunction with mild diastolic dysfunction.    OSH results:  CT chest w/o contrast 03/15/16  IMPRESSION:   1. Multiple lytic lesions throughout the thoracic spine and ribs asdescribed suggesting metastatic disease.   2. The lesion evident on the chest x-ray corresponds with a 5.7 x 3.3 x 4.5 cm mass infiltrating the left T9 rib with extension to the   left T8-9 foramen but no invasion of the foramen.   3. 4.1 x 3.1 cm exophytic mass lesion at T10 with pathologicfracture. The tumor extends into the spinal canal with moderate   central canal stenosis and right greater than left foraminalnarrowing at T9-10 and T10-11.   4. Prominent infiltrative tumor posteriorly at T11 withoutcompression fracture.   5. Additional pathologic fracture within the more distal left ninthrib.   6. Marked osteopenia with probable hemangioma is an high suspicion for multiple additional lesions throughout the thoracic spine.   Recommend MRI of the thoracic spine without and with contrast  for further evaluation   7. Multiple hypodense lesions within the liver also raise concern for metastatic disease. Dedicated CT of the abdomen and pelvis with   contrast is recommended for further evaluation of metastatic diseaseand possible primary source.   8. Emphysema and biapical scarring in the lungs without evidence forprimary tumor.    Bone Survey 03/16/16  1. There is probable pathologic fracture of T10 vertebral body suspicious for bony lesion. Expansile lesion of left posterior eighth rib suspicious for metastatic disease.     PET 03/22/16  1. Widespread lytic lesions throughout the visualized axial and appendicular skeleton, compatible with the reported clinical history  of multiple myeloma. This is most notable for the lytic lesion with pathologic compression fracture in the T10 vertebral body. No  definite extra-skeletal involvement noted on today's examination, which was limited by lack of acquisition of diagnostic PET images  secondary to patient pain during the examination.  2. Aortic atherosclerosis.  3. Additional incidental findings, as above.      Pathology:  OSH BMBx 03/25/16  1. Plasma cell neoplasm with mildy atypical plasma cells (>90% of the sampled marrow in the clot sections)  2. Abnormal FISH result: CCND1/IGH translocation with aneuploidy - t(11;14).  Aneuploid for all probes.  No evidence of t(4;14) or t(14;16)  3. Abnormal SNP microarray result, see separate report for details    Karyotype: 46,XY[20]  MM FISH: CCND1/IGH translocation with aneuploidy - t(11;14)    ASSESSMENT AND PLAN:  71 yo M with IgG kappa multiple myeloma.  He has been on therapy with RVD, but due to multiple hospitalizations,  has been unable to tolerate full dose therapy.  Most recently he did receive some Velcade but is only on Revlimid 5 mg 1 week on and 1 week off.  He has been off dexamethasone for some time.    #IgG kappa MM:   From his exam and presentation, appears he may have had cord compression at  diagnosis. His BLE are weak. However, he notes this is how it has been since diagnosis.  At this point, already radiated and had therapy, so no further spine imaging for now unless new symptoms.   Given his debilitation I am concerned he will not tolerated triplet therapy.  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  He is on a minimal dose of Rev due to intolerance and I will try to titrate up.  I will also try to add dexamethasone.  If he does not tolerate Rev/Dex can consider other options, Kd, Ix/dex etc   He is not currently a transplant candidate due to debilitation   Plan Zometa q3 mos, which he had already started in [place]    #B12 deficiency:   Per outside records previously deficient, tx with B12 injections, now no longer low.  Can continue orally but will need to continue checking serially as vegetarian and poor nutritional status    #Dysphagia to solids:   Referral to speech, especially in setting of hospitalization for aspiration PNA 10/17    #Dyspnea:   He appears to get breathless very quickly while speaking to me, and notes this has been the case for months.     Check PFTs   Referral to pulmonary    #Malnutrition:   ?due to dysphagia and other medical problems. Consult nutrition    #Debilitation:   Suspect some of his weakness is sequelae of cord compression   HH for PT/OT and RN assessment    RTC in 2 weeks

## 2016-07-26 ENCOUNTER — Ambulatory Visit: Payer: Medicare Other | Attending: Hematology & Oncology | Admitting: Hematology & Oncology

## 2016-07-26 ENCOUNTER — Encounter (HOSPITAL_BASED_OUTPATIENT_CLINIC_OR_DEPARTMENT_OTHER): Payer: Self-pay | Admitting: Hematology & Oncology

## 2016-07-26 ENCOUNTER — Other Ambulatory Visit (HOSPITAL_BASED_OUTPATIENT_CLINIC_OR_DEPARTMENT_OTHER): Payer: Medicare Other

## 2016-07-26 VITALS — BP 112/62 | HR 120 | Temp 97.9°F | Resp 16 | Ht 66.0 in | Wt 99.0 lb

## 2016-07-26 DIAGNOSIS — Z8611 Personal history of tuberculosis: Secondary | ICD-10-CM

## 2016-07-26 DIAGNOSIS — C9000 Multiple myeloma not having achieved remission: Principal | ICD-10-CM

## 2016-07-26 DIAGNOSIS — R0600 Dyspnea, unspecified: Secondary | ICD-10-CM | POA: Insufficient documentation

## 2016-07-26 DIAGNOSIS — R5381 Other malaise: Secondary | ICD-10-CM | POA: Insufficient documentation

## 2016-07-26 DIAGNOSIS — E538 Deficiency of other specified B group vitamins: Secondary | ICD-10-CM

## 2016-07-26 DIAGNOSIS — R778 Other specified abnormalities of plasma proteins: Secondary | ICD-10-CM

## 2016-07-26 DIAGNOSIS — E46 Unspecified protein-calorie malnutrition: Principal | ICD-10-CM | POA: Insufficient documentation

## 2016-07-26 LAB — CBC WITH DIFF, BLOOD
ANC-Automated: 4.5 10*3/uL (ref 1.6–7.0)
ANC-Instrument: 4.5 10*3/uL (ref 1.6–7.0)
Abs Lymphs: 1.5 10*3/uL (ref 0.8–3.1)
Abs Monos: 0.8 10*3/uL (ref 0.2–0.8)
Eosinophils: 1 %
Hct: 36.3 % — ABNORMAL LOW (ref 40.0–50.0)
Hgb: 11.6 gm/dL — ABNORMAL LOW (ref 13.7–17.5)
Lymphocytes: 21 %
MCH: 28.3 pg (ref 26.0–32.0)
MCHC: 32 g/dL (ref 32.0–36.0)
MCV: 88.5 um3 (ref 79.0–95.0)
MPV: 11.1 fL (ref 9.4–12.4)
Monocytes: 12 %
Plt Count: 178 10*3/uL (ref 140–370)
RBC: 4.1 10*6/uL — ABNORMAL LOW (ref 4.60–6.10)
RDW: 14.1 % — ABNORMAL HIGH (ref 12.0–14.0)
Segs: 66 %
WBC: 6.8 10*3/uL (ref 4.0–10.0)

## 2016-07-26 LAB — COMPREHENSIVE METABOLIC PANEL, BLOOD
ALT (SGPT): 27 U/L (ref 0–41)
AST (SGOT): 24 U/L (ref 0–40)
Albumin: 3.6 g/dL (ref 3.5–5.2)
Alkaline Phos: 96 U/L (ref 40–129)
Anion Gap: 12 mmol/L (ref 7–15)
BUN: 17 mg/dL (ref 8–23)
Bicarbonate: 26 mmol/L (ref 22–29)
Bilirubin, Tot: 0.44 mg/dL (ref <1.2)
Calcium: 9 mg/dL (ref 8.5–10.6)
Chloride: 102 mmol/L (ref 98–107)
Creatinine: 0.71 mg/dL (ref 0.67–1.17)
GFR: >60 mL/min
Glucose: 99 mg/dL (ref 70–99)
Potassium: 3.6 mmol/L (ref 3.5–5.1)
Sodium: 140 mmol/L (ref 136–145)
Total Protein: 7.3 g/dL (ref 6.0–8.0)

## 2016-07-26 LAB — IMMUNOGLOBULIN PANEL (IGA,IGG,IGM), BLOOD
IGA: 63 mg/dL — ABNORMAL LOW (ref 70–400)
IGG: 1773 mg/dL — ABNORMAL HIGH (ref 700–1600)
IGM: 12 mg/dL — ABNORMAL LOW (ref 40–230)

## 2016-07-26 LAB — VITAMIN B12, BLOOD: Vitamin B12: 1658 pg/mL — ABNORMAL HIGH (ref 232–1245)

## 2016-07-26 LAB — LDH, BLOOD: LDH: 183 U/L — ABNORMAL HIGH (ref 25–175)

## 2016-07-26 MED ORDER — LENALIDOMIDE 5 MG OR CAPS
5.00 mg | ORAL_CAPSULE | Freq: Every day | ORAL | Status: DC
Start: ? — End: 2016-07-28

## 2016-07-26 MED ORDER — ASPIRIN 81 MG OR TABS
81.00 mg | ORAL_TABLET | Freq: Every day | ORAL | Status: DC
Start: ? — End: 2016-08-11

## 2016-07-26 MED ORDER — ACYCLOVIR 400 MG OR TABS
400.0000 mg | ORAL_TABLET | Freq: Two times a day (BID) | ORAL | 3 refills | Status: DC
Start: 2016-07-26 — End: 2017-08-15

## 2016-07-26 MED ORDER — PANTOPRAZOLE SODIUM 40 MG OR PACK
40.00 mg | PACK | Freq: Every day | ORAL | Status: DC
Start: ? — End: 2016-09-19

## 2016-07-26 NOTE — Patient Instructions (Addendum)
PLAN:  1. Labs today.  2. Will work on getting needed equipment.   3. Consult placed to Nutrition. Please call [phone] to schedule an appt.  4. Will send Rx for Acyclovir to your local pharmacy.  5. Return to clinic 2/1 @ 11am.  6. Will contact you regarding starting Revlimid once lab results are back.  7. Consult placed to Pulmonary. Please call to schedule appt.      CASE MANAGERS:  Larey Days, RN   Kandis Nab, LVN    Phone # [phone]  Fax # [phone]  Pager # [phone]    After office hours MD on call line: [phone]. Ask to speak with BMT on call doctor     **Call to schedule, cancel, or reschedule clinic appointments: [phone]    [place]: [phone]  [place]: [phone]  Social Worker Lamonte Sakai): [phone]

## 2016-07-27 LAB — EPG, SERUM
A/G Ratio, EPG: 0.89
Albumin, EPG: 3.43 gm/dL (ref 3.20–5.00)
Alpha 1, EPG: 0.23 gm/dL (ref 0.10–0.40)
Alpha 2, EPG: 0.88 gm/dL (ref 0.60–1.10)
Beta, EPG: 0.99 gm/dL (ref 0.60–1.30)
Gamma, EPG: 1.77 gm/dL — ABNORMAL HIGH (ref 0.70–1.50)
M-Spike 1 (Gamma): 1.15 gm/dL — ABNORMAL HIGH (ref 0.00–0.01)
Total Protein, EPG: 7.3 gm/dL (ref 6.00–8.00)

## 2016-07-27 LAB — KAPPA LAMBDA FREE LIGHT CHAIN W/ RATIO, BLOOD
Kappa Free Light Chains Quant: 120.3 mg/L — ABNORMAL HIGH (ref 3.3–19.4)
Kappa:Lambda Free Light Chain Ratio: 8.98 — ABNORMAL HIGH (ref 0.26–1.65)
Lambda Free Light Chains Quant: 13.4 mg/L (ref 5.7–26.3)

## 2016-07-27 LAB — EPG, INTERPRETATION, SERUM

## 2016-07-27 LAB — IMMUNOFIX INTERPRETATION, SERUM

## 2016-07-27 LAB — IMMUNOFIXATION, BLOOD

## 2016-07-28 ENCOUNTER — Encounter (HOSPITAL_BASED_OUTPATIENT_CLINIC_OR_DEPARTMENT_OTHER): Payer: Self-pay | Admitting: Hematology & Oncology

## 2016-07-28 DIAGNOSIS — C9000 Multiple myeloma not having achieved remission: Principal | ICD-10-CM

## 2016-07-28 LAB — QUANTIFERON-TB, BLOOD
Quantiferon TB: NEGATIVE
TB Antigen - Nil: 0.044 [IU]/mL

## 2016-07-28 MED ORDER — LENALIDOMIDE 10 MG OR CAPS
10.0000 mg | ORAL_CAPSULE | Freq: Every day | ORAL | 0 refills | Status: DC
Start: 2016-07-28 — End: 2016-08-11

## 2016-07-29 ENCOUNTER — Other Ambulatory Visit: Payer: Self-pay

## 2016-07-30 MED FILL — LENALIDOMIDE CAP 10 MG: MG | 28 days supply | Qty: 21 | Fill #0

## 2016-08-01 ENCOUNTER — Telehealth (HOSPITAL_BASED_OUTPATIENT_CLINIC_OR_DEPARTMENT_OTHER): Payer: Self-pay | Admitting: Hematology & Oncology

## 2016-08-01 NOTE — Telephone Encounter (Signed)
SPOKE WITH PATIENT TO SCHEDULE. PATIENT IS SCHEDULED 1/23 @ 10:30 AM FOR INFUSION @ [facility]

## 2016-08-02 ENCOUNTER — Ambulatory Visit
Admission: RE | Admit: 2016-08-02 | Discharge: 2016-08-04 | Disposition: A | Discharge status: Home / Self Care | Payer: Medicare Other | Attending: Hematology & Oncology | Admitting: Hematology & Oncology

## 2016-08-02 ENCOUNTER — Telehealth (INDEPENDENT_AMBULATORY_CARE_PROVIDER_SITE_OTHER): Payer: Self-pay | Admitting: Pulmonary Disease

## 2016-08-02 ENCOUNTER — Other Ambulatory Visit (HOSPITAL_BASED_OUTPATIENT_CLINIC_OR_DEPARTMENT_OTHER): Payer: Self-pay | Admitting: Ambulatory Care

## 2016-08-02 ENCOUNTER — Encounter (HOSPITAL_BASED_OUTPATIENT_CLINIC_OR_DEPARTMENT_OTHER): Payer: Self-pay | Admitting: Hematology & Oncology

## 2016-08-02 DIAGNOSIS — C9000 Multiple myeloma not having achieved remission: Principal | ICD-10-CM | POA: Insufficient documentation

## 2016-08-02 MED ORDER — ZOLEDRONIC ACID 4 MG/5ML IV CONC
3.5000 mg | Freq: Once | INTRAVENOUS | Status: AC
Start: 2016-08-02 — End: 2016-08-02
  Administered 2016-08-02: 3.5 mg via INTRAVENOUS
  Filled 2016-08-02: qty 4.38

## 2016-08-02 MED ORDER — SODIUM CHLORIDE 0.9 % IV SOLN
10.0000 mL/h | Freq: Once | INTRAVENOUS | Status: AC
Start: 2016-08-02 — End: 2016-08-03

## 2016-08-02 MED ORDER — SODIUM CHLORIDE 0.9 % IV BOLUS
250.0000 mL | INJECTION | Freq: Once | INTRAVENOUS | Status: AC
Start: 2016-08-02 — End: 2016-08-02
  Administered 2016-08-02: 250 mL via INTRAVENOUS

## 2016-08-02 MED ORDER — ZOLEDRONIC ACID 4 MG/5ML IV CONC
4.0000 mg | Freq: Once | INTRAVENOUS | Status: DC
Start: 2016-08-02 — End: 2016-08-02

## 2016-08-02 NOTE — Interdisciplinary (Signed)
Non-Chemotherapy Infusion Nursing Note - [place]    Alan [name] is a 71 year old male who presents for infusion of Zometa    Vitals:    08/02/16 1058   BP: 121/90   BP Location: Left arm   BP Patient Position: Sitting   Pulse: 117   Resp: 17   Temp: 98.2 F (36.8 C)   TempSrc: Oral   SpO2: 98%   Weight: (!) 43.6 kg (96 lb 1.9 oz)   Height: 5' 2.6" (1.59 m)     Pain Score:    Body surface area is 1.39 meters squared.  Body mass index is 17.25 kg/(m^2).    Pre-treatment nursing assessment:  Here with family.  He arrived via wheelchair and required assistance to bed x 1.  He reports he has received Zometa before in another state.   PIV started.  No labs needed    269ml NS given over 30 minutes followed by Zometa over 15 minutes  Alan [name] tolerated treatment well.    Post blood return: Brisk  Post-Flush: NS and Discontinued IV    Patient Education  Treatment Education: Information/teaching given to patient including: signs and symptoms of infection, bleeding, adverse reaction(s), symptom control, and when to notify MD.  Alan [name] Prevention Education: Instructed patient to call for assistance.  Response: Verbalizes understanding  Medication Handout given    Discharge Plan  Discharge instructions given to patient.  Future appointments given and reviewed with treatment plan.  Discharge Mode: Wheelchair  Accompanied by: Family  Discharged To: Home

## 2016-08-02 NOTE — Telephone Encounter (Signed)
Patient is scheduled for consult with Dr.Nobari on 2/14 for Dyspnea, unspecified type    Please mail new pt questionnaire     Referring Provider: Brand Males     Patients PCP (update PCP on chart if needed):  [facility]     Was Auth/referral entered na    Internal or External Referral, internal       * If Internal: route to MD's hospital tech        * If external: route to CC scanner      Was patient informed to bring any outside CD imaging (if available) to consult?

## 2016-08-03 NOTE — Telephone Encounter (Signed)
Called and psoke with pt's son, Alan [name], to clarify questions asking regarding pt's Revlimid via [employer]. Let [name] know, that prior to starting Revlimid at the higher dose of 10 mg, Dr. Brand Males would like to see pt's labs. Let Alan [name] know that I have sent lab orders, as previously discussed, to his local [place]. Address and phone number to [place] given to [facility]. Let him know that once we receive lab results, we will call him to give further instructions of if/ and when to start Revlimid. Understading verbalized, no further questions at time of call.

## 2016-08-04 ENCOUNTER — Telehealth (HOSPITAL_BASED_OUTPATIENT_CLINIC_OR_DEPARTMENT_OTHER): Payer: Self-pay

## 2016-08-04 NOTE — Telephone Encounter (Signed)
New patient paperwork and appointment reminder mailed.

## 2016-08-04 NOTE — Telephone Encounter (Signed)
Called and spoke with pt's son, Alan [name], and let him know that we have received his dad's lab results done at [facility] yesterday. Let him know that his labs looked good, and instructed him to have his dad start Revlimid 10 mg daily days 1-21, out of 28 days. Let him know that we will send standing lab orders to their local [facility] for pt to have labs done weekly. Understanding verbalized, denied further questions/ concerns at time of call.

## 2016-08-08 NOTE — Progress Notes (Signed)
New Start Oral Chemotherapy Pharmacy Note    Safe Handling Instructions: Instructed patient on safe handling and proper disposal of chemotherapy.    Indication (reviewed for clinical appropriateness and effectiveness): Multiple Myeloma    Regimen Dose/Schedule: Revlimid 7m cap #21: Take 1 cap po daily on Days 1 to 21 out of a 28 day cycle.    Start Date: 08/11/2016    Management of Missed Doses: Instructed patient on what to do in case of missed dose. Do not take double or extra doses. Inform MD of missed dose.    Management of Food and Drug Interactions: Discussed with MD or pharmacist if starting any new medications for potential drug interactions. Advised patient to avoid taking products that contain aspirin, acetaminophen, or NSAIDs unless instructed by the doctor (masking fever).    Potential Side Effects/Toxicities:  Revlimid:  Completed required REMS RevAssist Education and Counseling Checklist for Pharmacies with pArchivist     When and How to Contact the Office: Verified patient has doctor's office contact information and understand of when to contact doctor.

## 2016-08-10 NOTE — Progress Notes (Signed)
BONE MARROW TRANSPLANT      Reason for Referral: transferring care from [facility]    Primary Care Physician: Bobette Mo, MD    Myeloma Data at Diagnosis:  Date of diagnosis:   Age: 71  Hgb:   Creat:   Alb:   Calcium:   B2M: 2.5  Bone marrow plasma cell %:   Cytogenetics/FISH:  Skeletal survey:    Free light chains: kappa 1005.1/lambda 7.1; ratio 141.56   SPEP/ M-protein: 3.1  IFE: IgG kappa  R-ISS:  ISS: unknown  [place]:    Interval History:  Since his last appt - he started the increased dose of Revlimid 10 mg. He already noted difficulty with eating, more nausea more generalized pain.   He has not had a BM x 3 days     Oncologic History:  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  He had an abnl chest X-ray which lead to a chest CT which noted multiple lytic lesions in thoracic vertebrae, as well as compression fractures.  He was referred to Dr. Charlaine Dalton for further evaluation.  He had a bone marrow biopsy that showed MM.  He had significant pain from his spine lesions and had palliative radiation from 03/24/16-04/11/16 from T9-T11 and exophytic masses.  His pain resolved once he finished radiation therapy.      He was then started on Revlimid 20 mg po daily 2 weeks on 1 week off as well as Velcade SQ and dexamethasone    He had several hospitalizations for infection, difficulty with weight gain, eating.  He was placed on TPN and then transitioned to tube feeds by 11/17.      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  At this point, he's off of dexamethasone.  He is taking Revlimid 5 mg po daily 1 week on 1 week off.    Given his significant debilitation and illness, he moved to [facility] to have more support/caregiving.  He and his wife have moved in with their son, Alan [name] in [place]. They have another son and daughter in [place].    He is here in a  wheelchair, but at home walks with a walker though cannot manage long distances over 100 steps or so  He is eating although still supplementing with tube feeds as can only manage some soups/lentils 3x/day    Hospitalizations and significant events:   9/26-10/4: Admitted with N/V and failure to thrive, felt to have gastritis  10/16-10/25: Admitted with aspiration pneumonia; tx with vanc/zosyn/azithro  05/18/16: PEG tube placed  06/18/16: ER visit for abdominal pain at PEG site      Review of Systems:  General: No fever, chills.  +fatigued very easily  Eyes: No icterus, vision change, double vision, eye pain, blurry vision, or floaters.   Oral/throat: No oral pain, oral ulcers or lesions, tooth pain, sore throat. +difficulty swallowing pills especially  Nose: No nasal discharge, sinus pain.   Ears: No change in hearing or ear pain.   Neck: No pain, no masses.  Cardiac: No chest pain or pressure, palpitations, or edema.   Pulmonary: No chest pain, +dyspnea on exertion  Abdomen: No pain, bloating, +nausea several times/week, vomiting, diarrhea, +occasional constipation - takes MOM, melena, or blood per rectum.   Genitourinary: No dysuria, change in urinary frequency.   Extremities: No swelling, pain +numbness  in feet, also fingertips.   Skin: No rash, lesions, pruritus.   Neurologic:  No headache, numbness, +general weakness, balance problems.   Psych: Normal mood     Past Medical/Surgical Hx:   1. Multiple myeloma  2. Retinal detachment 2006    Social History:   He is married. He and his wife moved from [place] to the [place] in 2002.  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  Since 2010 he's been very involved with an ashram in [place], [place].  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    He denies EtOH/never smoker/no smokeless tobacco.  He is vegetarian  He has 3 children, 2 sons and 1 daughter    Family History:   He has no known family h/o cancer.  Father died when patient was  only 3 mos but he is not aware of cause    Medications:    Current Outpatient Prescriptions:     acyclovir (ZOVIRAX) 400 MG tablet, Take 1 tablet (400 mg) by mouth 2 times daily., Disp: 60 tablet, Rfl: 3    aspirin 81 MG tablet, Take 1 tablet (81 mg) by mouth daily., Disp: 30 tablet, Rfl: 3    [START ON 08/15/2016] dexamethasone (DECADRON) 4 MG tablet, Take 1 tablet (4 mg) by mouth once a week., Disp: 20 tablet, Rfl: 3    docusate sodium (COLACE) 250 MG capsule, Take 1 capsule (250 mg) by mouth 2 times daily., Disp: 60 capsule, Rfl: 2    HYDROcodone-acetaminophen (NORCO) 5-325 MG tablet, Take 1 tablet by mouth every 6 hours as needed for Moderate Pain (Pain Score 4-6)., Disp: , Rfl:     pantoprazole (PROTONIX) 40 MG packet, Take 40 mg by mouth daily., Disp: , Rfl:     Pomalidomide 4 MG CAPS, Take 4 mg by mouth daily. Take 1 tab po daily on days 1-21 of a 28 day cycle, Disp: 21 capsule, Rfl: 2    senna (SENOKOT) 8.6 MG tablet, Take 1 tablet (8.6 mg) by mouth 2 times daily as needed for Constipation., Disp: 120 tablet, Rfl: 3    Allergies:  Review of patient's allergies indicates no known allergies.    Objective:   08/11/16  1118   BP: 124/83   Pulse: 112   Resp: 15   Temp: 98.3 F (36.8 C)   SpO2: 97%       Physical Exam:   Karnofsky performance status: 50  General: Thin, chronically ill appearing male, no distress  HEENT: EOMI, PERRL, sclera anicteric, conjunctiva pink and moist, oral cavity without lesions or ulcers, tonsils normal   Neck: Supple, no lymphadenopathy   Lungs: Clear to auscultation bilaterally, no wheezes, rubs, or rales   Cardiac: No JVD, tachycardic, normal rhythm, normal S1, S2, no murmurs or gallops   Abdomen: Not distended, normal bowel sounds, soft, non-tender, no organomegaly, exam limited in wheelchair as weak to transfer  Extremities: Warm, well perfused, no cyanosis, no clubbing, no edema   Skin: No jaundice, no petechiae, no purpura   Lymph: No lymphadenopathy at cervical,  supraclavicular, axillary, or inguinal lymph nodes   Neurologic: Awake, alert, oriented.  +BLE weakness R>L    Lab results:  Lab Results   Component Value Date    WBC 6.8 07/26/2016    RBC 4.10 07/26/2016    HGB 11.6 07/26/2016    HCT 36.3 07/26/2016    MCV 88.5 07/26/2016    MCHC 32.0 07/26/2016  RDW 14.1 07/26/2016    PLT 178 07/26/2016    MPV 11.1 07/26/2016    SEG 66 07/26/2016    LYMPHS 21 07/26/2016    MONOS 12 07/26/2016    EOS 1 07/26/2016     Lab Results   Component Value Date    NA 140 07/26/2016    K 3.6 07/26/2016    CL 102 07/26/2016    BICARB 26 07/26/2016    BUN 17 07/26/2016    CREAT 0.71 07/26/2016    GLU 99 07/26/2016    [place] 9.0 07/26/2016     Lab Results   Component Value Date    AST 24 07/26/2016    ALT 27 07/26/2016    LDH 183 07/26/2016    ALK 96 07/26/2016    TP 7.3 07/26/2016    ALB 3.6 07/26/2016    TBILI 0.44 07/26/2016     OSH Labs:   03/16/2016 04/27/2016 06/07/2016 06/18/2016   kappa 1005.1 44.4 136.9 142.7   lambda 7.1 5.4 23.4 24.4   k/l 141.56 8.22 5.85 5.85   IgG 3817 1132 1390    M-spike 3.1 0.8 0.7      Results for Alan [name], Alan [name] (MRN [id number]) as of 08/10/2016 15:03   Ref. Range 07/26/2016 11:55   M-Spike 1 (Gamma) Latest Ref Range: 0.00 - 0.01 gm/dL 1.15 (H)   IGA Latest Ref Range: 70 - 400 mg/dL 63 (L)   IGG Latest Ref Range: 700 - 1600 mg/dL 1773 (H)   IGM Latest Ref Range: 40 - 230 mg/dL 12 (L)   Kappa Free Light Chains Quant Latest Ref Range: 3.3 - 19.4 mg/L 120.3 (H)   Lambda Free Light Chains Quant Latest Ref Range: 5.7 - 26.3 mg/L 13.4   Kappa:Lambda Free Light Chain Ratio Latest Ref Range: 0.26 - 1.65  8.98 (H)     [facility] 1/24:  M-spike 1.1  IgA 58  IgG 1561  IgM 12  Kappa 134.0  Lambda 16.4  K/l 8.17    [facility] 1/31:  WBC 5.1  Hgb 11.8  Hct 35.4  Plt 193  ANC 2.8  Creatinine 0.71  Alk phos 86  AST 17  ALT 13    Radiology:  OSH Echo 05/01/16  - Left ventricle: The cavity size was normal. Systolic function was normal. The estimated ejection fraction was 50%.  Diffuse  hypokinesis. Doppler parameters are consistent with abnormal leftventricular relaxation (grade 1 diastolic dysfunction).  - Aortic valve: There was mild regurgitation. Valve area (Vmax):1.81 cm^2.  - Mitral valve: There was mild regurgitation.  - Left atrium: The atrium was mildly dilated.  - Atrial septum: No defect or patent foramen ovale was identified.    Impressions:  - Mild LV systolic dysfunction with mild diastolic dysfunction.    OSH results:  CT chest w/o contrast 03/15/16  IMPRESSION:   1. Multiple lytic lesions throughout the thoracic spine and ribs asdescribed suggesting metastatic disease.   2. The lesion evident on the chest x-ray corresponds with a 5.7 x 3.3 x 4.5 cm mass infiltrating the left T9 rib with extension to the   left T8-9 foramen but no invasion of the foramen.   3. 4.1 x 3.1 cm exophytic mass lesion at T10 with pathologicfracture. The tumor extends into the spinal canal with moderate   central canal stenosis and right greater than left foraminalnarrowing at T9-10 and T10-11.   4. Prominent infiltrative tumor posteriorly at T11 withoutcompression fracture.   5. Additional pathologic fracture within the more  distal left ninthrib.   6. Marked osteopenia with probable hemangioma is an high suspicion for multiple additional lesions throughout the thoracic spine.   Recommend MRI of the thoracic spine without and with contrast for further evaluation   7. Multiple hypodense lesions within the liver also raise concern for metastatic disease. Dedicated CT of the abdomen and pelvis with   contrast is recommended for further evaluation of metastatic diseaseand possible primary source.   8. Emphysema and biapical scarring in the lungs without evidence forprimary tumor.    Bone Survey 03/16/16  1. There is probable pathologic fracture of T10 vertebral body suspicious for bony lesion. Expansile lesion of left posterior eighth rib suspicious for metastatic disease.     PET  03/22/16  1. Widespread lytic lesions throughout the visualized axial and appendicular skeleton, compatible with the reported clinical history  of multiple myeloma. This is most notable for the lytic lesion with pathologic compression fracture in the T10 vertebral body. No  definite extra-skeletal involvement noted on today's examination, which was limited by lack of acquisition of diagnostic PET images  secondary to patient pain during the examination.  2. Aortic atherosclerosis.  3. Additional incidental findings, as above.      Pathology:  OSH BMBx 03/25/16  1. Plasma cell neoplasm with mildy atypical plasma cells (>90% of the sampled marrow in the clot sections)  2. Abnormal FISH result: CCND1/IGH translocation with aneuploidy - t(11;14).  Aneuploid for all probes.  No evidence of t(4;14) or t(14;16)  3. Abnormal SNP microarray result, see separate report for details    Karyotype: 46,XY[20]  MM FISH: CCND1/IGH translocation with aneuploidy - t(11;14)    ASSESSMENT AND PLAN:  71 yo M with IgG kappa multiple myeloma.  He has been on therapy with RVD, but due to multiple hospitalizations, has been unable to tolerate full dose therapy.  Most recently he did receive some Velcade but is only on Revlimid 5 mg 1 week on and 1 week off.  He has been off dexamethasone for some time.  As of 1/24 we started on Lenalidomide 10 mg po daily    #IgG kappa MM:   From his exam and presentation, appears he may have had cord compression at diagnosis. His BLE are weak. However, he notes this is how it has been since diagnosis.  At this point, already radiated and had therapy, so no further spine imaging for now unless new symptoms.   Given his debilitation I am concerned he will not tolerate triplet therapy.  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  If he does not tolerate Rev/Dex can consider other options, Pd, Ix/dex etc   He is not tolerating even a small increase in his Rev dosing, and yet his  M-spike is rising. I think we need to change therapy.  I would rather a faster response, so will try pom/dex rather than ix/dex.  Did also discuss infusion options such as carfilzomib - but as he already has dyspnea I think this will be problematic.  Daratumumab is also an option, however given travel and distance - will attempt oral therapy first if possible     He is not currently a transplant candidate due to debilitation   Continue Zometa q3 mos    #B12 deficiency:   Per outside records previously deficient, tx with B12 injections, now no longer low.     Will prescribe oral repletion but will need to continue checking serially as  vegetarian and poor nutritional status    #Dysphagia to solids:   Referral to speech, especially in setting of hospitalization for aspiration PNA 10/17 - has already seen at home    #Dyspnea:   He appears to get breathless very quickly while speaking to me, and notes this has been the case for months.     Check PFTs   Referral to pulmonary    #Malnutrition:   ?due to dysphagia and other medical problems. Consult nutrition - referral placed last visit, will provide # for him to call to schedule    #Debilitation:   Suspect some of his weakness is sequelae of cord compression   HH for PT/OT and RN assessment    #Constipation:   Scheduled colace bid   Prn senna, prn MOM      Plan:  Change Rev to every other day until pomalidomide arrives - then pomalidomide 4 mg po daily on days 1-21 of a 28 day cycle  Check skeletal survey given generalized pain now and small rise in M-spike  Start scheduled colace, prn senna and can continue MOM prn  Will try to arrange f/u on 2/14 to save visits.

## 2016-08-11 ENCOUNTER — Encounter (INDEPENDENT_AMBULATORY_CARE_PROVIDER_SITE_OTHER): Payer: Medicare Other

## 2016-08-11 ENCOUNTER — Ambulatory Visit: Payer: Medicare Other | Attending: Hematology & Oncology | Admitting: Hematology & Oncology

## 2016-08-11 ENCOUNTER — Ambulatory Visit (HOSPITAL_BASED_OUTPATIENT_CLINIC_OR_DEPARTMENT_OTHER)
Admission: RE | Admit: 2016-08-11 | Discharge: 2016-08-11 | Disposition: A | Discharge status: Home Health | Payer: Medicare Other | Source: Ambulatory Visit | Attending: Hematology & Oncology | Admitting: Hematology & Oncology

## 2016-08-11 VITALS — BP 124/83 | HR 112 | Temp 98.3°F | Resp 15 | Ht 66.0 in | Wt 98.7 lb

## 2016-08-11 DIAGNOSIS — J984 Other disorders of lung: Secondary | ICD-10-CM

## 2016-08-11 DIAGNOSIS — C9000 Multiple myeloma not having achieved remission: Principal | ICD-10-CM | POA: Insufficient documentation

## 2016-08-11 DIAGNOSIS — R0600 Dyspnea, unspecified: Secondary | ICD-10-CM | POA: Insufficient documentation

## 2016-08-11 MED ORDER — DEXAMETHASONE 4 MG OR TABS
4.0000 mg | ORAL_TABLET | ORAL | 3 refills | Status: DC
Start: 2016-08-15 — End: 2016-08-17

## 2016-08-11 MED ORDER — ASPIRIN 81 MG OR TABS
81.0000 mg | ORAL_TABLET | Freq: Every day | ORAL | 3 refills | Status: DC
Start: 2016-08-11 — End: 2016-08-12

## 2016-08-11 MED ORDER — POMALIDOMIDE 4 MG PO CAPS
4.00 mg | ORAL_CAPSULE | Freq: Every day | ORAL | 2 refills | Status: DC
Start: 2016-08-11 — End: 2016-10-06

## 2016-08-11 MED ORDER — SENNA 8.6 MG OR TABS
8.6000 mg | ORAL_TABLET | Freq: Two times a day (BID) | ORAL | 3 refills | Status: DC | PRN
Start: 2016-08-11 — End: 2016-12-23

## 2016-08-11 MED ORDER — HYDROCODONE-ACETAMINOPHEN 5-325 MG OR TABS
1.00 | ORAL_TABLET | Freq: Four times a day (QID) | ORAL | Status: DC | PRN
Start: ? — End: 2016-12-23

## 2016-08-11 MED ORDER — DOCUSATE SODIUM 250 MG OR CAPS
250.0000 mg | ORAL_CAPSULE | Freq: Two times a day (BID) | ORAL | 2 refills | Status: DC
Start: 2016-08-11 — End: 2016-12-23

## 2016-08-11 NOTE — Procedures (Addendum)
Lung volumes performed via [facility].

## 2016-08-11 NOTE — Patient Instructions (Addendum)
PLAN:  1. Please call [phone] to schedule an appt for Nutrition Consult.  2. Decrease Revlimid 10 mg from daily, to every other day until Pomalyst arrives. Do not take Pomalyst until instructed.  3. Start Colace twice daily today.  4. Add Senna twice daily as needed.  5. Begin Dexamethasone 20 mg weekly, tomorrow.  6. Order placed for Skeletal Survey. Please call to schedule appt. Try to schedule 2/14 as you will be here.  7. Weekly labs.  5. Return to clinic 2/14. Will let you know what time once scheduled.    CASE MANAGERS:  Larey Days, RN   Kandis Nab, LVN    Phone # [phone]  Fax # [phone]  Pager # [phone]    After office hours MD on call line: [phone]. Ask to speak with BMT on call doctor     **Call to schedule, cancel, or reschedule clinic appointments: [phone]    [place]: [phone]  [place]: [phone]  Social Worker Lamonte Sakai): [phone]

## 2016-08-12 ENCOUNTER — Other Ambulatory Visit (HOSPITAL_BASED_OUTPATIENT_CLINIC_OR_DEPARTMENT_OTHER): Payer: Self-pay | Admitting: Hematology & Oncology

## 2016-08-12 ENCOUNTER — Encounter (HOSPITAL_BASED_OUTPATIENT_CLINIC_OR_DEPARTMENT_OTHER): Payer: Self-pay

## 2016-08-12 DIAGNOSIS — C9000 Multiple myeloma not having achieved remission: Principal | ICD-10-CM

## 2016-08-14 MED ORDER — ASPIRIN 81 MG OR TABS
81.0000 mg | ORAL_TABLET | Freq: Every day | ORAL | 3 refills | Status: DC
Start: 2016-08-14 — End: 2016-08-19

## 2016-08-14 NOTE — Telephone Encounter (Signed)
From: Genelle Gather  To: Su Monks., MD  Sent: 08/12/2016 7:55 PM PST  Subject: Medication Renewal Request    Original authorizing provider: Su Monks, MD    Genelle Gather would like a refill of the following medications:  aspirin 81 MG tablet Su Monks, MD]    Preferred pharmacy: [facility], [place], [place] [address]    Comment:

## 2016-08-15 ENCOUNTER — Telehealth: Payer: Self-pay | Admitting: *Deleted

## 2016-08-15 ENCOUNTER — Telehealth: Payer: Self-pay | Admitting: Internal Medicine

## 2016-08-15 ENCOUNTER — Other Ambulatory Visit
Admit: 2016-08-15 | Discharge: 2016-08-15 | Disposition: A | Discharge status: Home / Self Care | Payer: Medicare Other | Attending: Hematology & Oncology | Admitting: Hematology & Oncology

## 2016-08-15 DIAGNOSIS — R897 Abnormal histological findings in specimens from other organs, systems and tissues: Secondary | ICD-10-CM

## 2016-08-15 DIAGNOSIS — D649 Anemia, unspecified: Secondary | ICD-10-CM

## 2016-08-15 DIAGNOSIS — D72828 Other elevated white blood cell count: Secondary | ICD-10-CM

## 2016-08-15 DIAGNOSIS — C9000 Multiple myeloma not having achieved remission: Principal | ICD-10-CM

## 2016-08-15 NOTE — Telephone Encounter (Signed)
Left message for son to call back 

## 2016-08-15 NOTE — Telephone Encounter (Signed)
md notified that pt's son wanted to speak to him. md states that he will call pt's son.

## 2016-08-15 NOTE — Telephone Encounter (Signed)
Requesting to speak with Dr Rogue Bussing  [phone]

## 2016-08-16 ENCOUNTER — Other Ambulatory Visit (HOSPITAL_BASED_OUTPATIENT_CLINIC_OR_DEPARTMENT_OTHER): Payer: Self-pay | Admitting: Pharmacist

## 2016-08-16 ENCOUNTER — Encounter (HOSPITAL_BASED_OUTPATIENT_CLINIC_OR_DEPARTMENT_OTHER): Payer: Self-pay

## 2016-08-16 MED FILL — POMALIDOMIDE CAP 4 MG: MG | 28 days supply | Qty: 21 | Fill #0

## 2016-08-16 NOTE — Progress Notes (Signed)
New Start Oral Chemotherapy Pharmacy Note    Safe Handling Instructions: Instructed patient on safe handling and proper disposal of chemotherapy.    Indication (reviewed for clinical appropriateness and effectiveness):    Regimen Dose/Schedule: Pomalyst 4mg  #21 - 1 capsule daily on days 1- 21, off 7 days    Start Date: 02.07.18    Management of Missed Doses: Instructed patient on what to do in case of missed dose. Do not take double or extra doses. Inform MD of missed dose.    Management of Food and Drug Interactions: Discussed with MD or pharmacist if starting any new medications for potential drug interactions. Advised patient to avoid taking products that contain aspirin, acetaminophen, or NSAIDs unless instructed by the doctor (masking fever).    Potential Side Effects/Toxicities:    Pomalyst:  * BBW: cat x, contraindicated in pregnancy, DVT, PE  * take at same time every day, no break/ chew/ open the capsules  * consider thrombotic ppx  * ADE: (>25%): fatigue, asthenia, constipation, nausea, diarrhea, back pain, upper respiratory tract infections, dyspnea, anemia, thrompocytopenia, dizziness, confusion   * avoid donating blood/ sperm during tx and for at least 4 weeks after stopping the medication    Completed required Pomalyst REMS Education and Counseling Checklist for Pharmacies with Archivist.       When and How to Contact the Office: Verified patient has doctor's office contact information and understand of when to contact doctor.

## 2016-08-17 ENCOUNTER — Encounter (HOSPITAL_BASED_OUTPATIENT_CLINIC_OR_DEPARTMENT_OTHER): Payer: Self-pay

## 2016-08-17 DIAGNOSIS — C9000 Multiple myeloma not having achieved remission: Principal | ICD-10-CM

## 2016-08-17 MED ORDER — DEXAMETHASONE 4 MG OR TABS
4.0000 mg | ORAL_TABLET | ORAL | 3 refills | Status: DC
Start: 2016-08-22 — End: 2016-08-30

## 2016-08-18 ENCOUNTER — Encounter (HOSPITAL_BASED_OUTPATIENT_CLINIC_OR_DEPARTMENT_OTHER): Payer: Self-pay

## 2016-08-19 ENCOUNTER — Encounter (HOSPITAL_BASED_OUTPATIENT_CLINIC_OR_DEPARTMENT_OTHER): Payer: Self-pay

## 2016-08-19 DIAGNOSIS — C9000 Multiple myeloma not having achieved remission: Principal | ICD-10-CM

## 2016-08-19 MED ORDER — ASPIRIN 81 MG OR TABS
81.0000 mg | ORAL_TABLET | Freq: Every day | ORAL | 3 refills | Status: DC
Start: 2016-08-19 — End: 2016-08-30

## 2016-08-24 ENCOUNTER — Ambulatory Visit (INDEPENDENT_AMBULATORY_CARE_PROVIDER_SITE_OTHER): Payer: Medicare Other | Admitting: Pulmonary Disease

## 2016-08-24 ENCOUNTER — Other Ambulatory Visit: Payer: Medicare Other | Attending: Hematology & Oncology

## 2016-08-24 DIAGNOSIS — C9000 Multiple myeloma not having achieved remission: Secondary | ICD-10-CM

## 2016-08-24 DIAGNOSIS — R0600 Dyspnea, unspecified: Principal | ICD-10-CM

## 2016-08-24 LAB — COMPREHENSIVE METABOLIC PANEL, BLOOD
ALT (SGPT): 14 U/L (ref 0–41)
AST (SGOT): 15 U/L (ref 0–40)
Albumin: 3.9 g/dL (ref 3.5–5.2)
Alkaline Phos: 92 U/L (ref 40–129)
Anion Gap: 13 mmol/L (ref 7–15)
BUN: 20 mg/dL (ref 8–23)
Bicarbonate: 27 mmol/L (ref 22–29)
Bilirubin, Tot: 0.38 mg/dL (ref <1.2)
Calcium: 9.2 mg/dL (ref 8.5–10.6)
Chloride: 102 mmol/L (ref 98–107)
Creatinine: 0.68 mg/dL (ref 0.67–1.17)
GFR: >60 mL/min
Glucose: 110 mg/dL — ABNORMAL HIGH (ref 70–99)
Potassium: 3.9 mmol/L (ref 3.5–5.1)
Sodium: 142 mmol/L (ref 136–145)
Total Protein: 7.2 g/dL (ref 6.0–8.0)

## 2016-08-24 LAB — CBC WITH DIFF, BLOOD
ANC-Automated: 2.5 10*3/uL (ref 1.6–7.0)
ANC-Instrument: 2.5 10*3/uL (ref 1.6–7.0)
Abs Eosinophils: 0.1 10*3/uL (ref 0.1–0.5)
Abs Lymphs: 1.1 10*3/uL (ref 0.8–3.1)
Abs Monos: 0.2 10*3/uL (ref 0.2–0.8)
Eosinophils: 2 %
Hct: 40.3 % (ref 40.0–50.0)
Hgb: 12.8 gm/dL — ABNORMAL LOW (ref 13.7–17.5)
Lymphocytes: 29 %
MCH: 27.5 pg (ref 26.0–32.0)
MCHC: 31.8 g/dL — ABNORMAL LOW (ref 32.0–36.0)
MCV: 86.5 um3 (ref 79.0–95.0)
MPV: 9.9 fL (ref 9.4–12.4)
Monocytes: 5 %
Plt Count: 198 10*3/uL (ref 140–370)
RBC: 4.66 10*6/uL (ref 4.60–6.10)
RDW: 14.6 % — ABNORMAL HIGH (ref 12.0–14.0)
Segs: 64 %
WBC: 4 10*3/uL (ref 4.0–10.0)

## 2016-08-24 LAB — IMMUNOGLOBULIN PANEL (IGA,IGG,IGM), BLOOD
IGA: 68 mg/dL — ABNORMAL LOW (ref 70–400)
IGG: 1242 mg/dL (ref 700–1600)
IGM: 26 mg/dL — ABNORMAL LOW (ref 40–230)

## 2016-08-24 LAB — LDH, BLOOD: LDH: 157 U/L (ref 25–175)

## 2016-08-24 LAB — KAPPA LAMBDA FREE LIGHT CHAIN W/ RATIO, BLOOD
Kappa Free Light Chains Quant: 72.5 mg/L — ABNORMAL HIGH (ref 3.3–19.4)
Kappa:Lambda Free Light Chain Ratio: 5.8 — ABNORMAL HIGH (ref 0.26–1.65)
Lambda Free Light Chains Quant: 12.5 mg/L (ref 5.7–26.3)

## 2016-08-24 NOTE — Patient Instructions (Signed)
Please complete tests prior to next visit.

## 2016-08-24 NOTE — Progress Notes (Signed)
Pulmonary Outpatient Clinic  Attending requesting consult: Dr. Brand Males  Service requesting consult:  BMT    CC/Reason for consult:  dyspnea    HPI:  Patient is a 71 y/o M with history of IgG kappa MM.  Patient has undergone radiation therapy.  Patient has had dyspnea as of late, and thus referred to pulmonary for evaluation. Review of his history demonstrates frequent previous hospitalizations due to failure to thrive, aspiration pneumonia and abdominal pain.  Since his diagnosis of multiple myeloma he has had shortness of breath.  No history of asthma or COPD.  He is not using inhalers at this time. When he eats and drinks, he does not notice a cough. Symptoms not worse when supine. Patient has chronic pain. Patient feels his dyspnea is a side effect from treatment for MM. It appears patient is now on pomalidomide.     He denies wheezing. Denies chest pain. Patient endorses weight loss and loss of appetite.     Review of Systems:  Constitutional: negative.  Eyes: negative for: blurry vision.  Ears, Nose, Mouth, Throat: negative for: difficulty swallowing.  CV: negative for:  chest pain, jaw claudication  Resp: negative for:  hemoptysis  GI: negative.  GU: negative.  Musculoskeletal: negative.  Integumentary: negative.  Neuro: negative  Endo: negative.  Heme/Lymphatic: negative.    PMH  Multiple myeloma  Retinal detachment in 2006    FH:  Non-contrubutory    Allergies: No Known Allergies  Current Outpatient Prescriptions   Medication Sig    acyclovir (ZOVIRAX) 400 MG tablet Take 1 tablet (400 mg) by mouth 2 times daily.    aspirin 81 MG tablet Take 1 tablet (81 mg) by mouth daily.    dexamethasone (DECADRON) 4 MG tablet Take 1 tablet (4 mg) by mouth once a week.    docusate sodium (COLACE) 250 MG capsule Take 1 capsule (250 mg) by mouth 2 times daily.    HYDROcodone-acetaminophen (NORCO) 5-325 MG tablet Take 1 tablet by mouth every 6 hours as needed for Moderate Pain (Pain Score 4-6).    pantoprazole  (PROTONIX) 40 MG packet Take 40 mg by mouth daily.    Pomalidomide 4 MG CAPS Take 4 mg by mouth daily. Take 1 tab po daily on days 1-21 of a 28 day cycle    senna (SENOKOT) 8.6 MG tablet Take 1 tablet (8.6 mg) by mouth 2 times daily as needed for Constipation.     No current facility-administered medications for this visit.      Social History     Social History    Marital status: Married     Spouse name: N/A    Number of children: N/A    Years of education: N/A     Occupational History    Not on file.     Social History Main Topics    Smoking status: Never Smoker    Smokeless tobacco: Never Used    Alcohol use Not on file    Drug use: Not on file    Sexual activity: Not on file     Other Topics Concern    Moved from [place] to [place] in 2002.  Used to teach Yoga.      Social History Narrative    No narrative on file     Physical Examination:  Temperature:  [97.5 F (36.4 C)] 97.5 F (36.4 C) (02/14 1622)  Blood pressure (BP): (131)/(89) 131/89 (02/14 1622)  Heart Rate:  [103] 103 (02/14 1622)  Respirations:  [  16] 16 (02/14 1622)  Pain Score: 0 (02/14 1622)  SpO2:  [94 %] 94 % (02/14 1622)    General: cachectic, sitting in a wheelchair  LN: no cervical nodes palpable;   HEENT: nasal mucosa not inflamed, nares patent, teeth in   good repair.   Neck: supple, trachea midline, no thyromegaly or neck masses; no JVD  Respiratory: Chest palpation normal; Thorax expands symmetrically with no use of accessory muscles, lungs clear to auscultation and percussion;   Cardiovascular: RRR, no murmurs, gallops or rubs; Peripheral pulses intact;   Abdomen: non tender, no masses or organomegaly,   Back: normal;   Genital/Rectal: not done;   Extremities: The extremities reveal no evidence of cyanosis, clubbing, edema, deformities, rashes or tremors.  Musculoskeletal: normal motor strength; normal gait;   Neuro/Psychiatric: alert and oriented to time, place and person without abnormalities in mood or affect.    Imaging:  OSH  results:  CT chest w/o contrast 03/15/16  IMPRESSION:   1. Multiple lytic lesions throughout the thoracic spine and ribs asdescribed suggesting metastatic disease.   2. The lesion evident on the chest x-ray corresponds with a 5.7 x 3.3 x 4.5 cm mass infiltrating the left T9 rib with extension to the   left T8-9 foramen but no invasion of the foramen.   3. 4.1 x 3.1 cm exophytic mass lesion at T10 with pathologicfracture. The tumor extends into the spinal canal with moderate   central canal stenosis and right greater than left foraminalnarrowing at T9-10 and T10-11.   4. Prominent infiltrative tumor posteriorly at T11 withoutcompression fracture.   5. Additional pathologic fracture within the more distal left ninthrib.   6. Marked osteopenia with probable hemangioma is an high suspicion for multiple additional lesions throughout the thoracic spine.   Recommend MRI of the thoracic spine without and with contrast for further evaluation   7. Multiple hypodense lesions within the liver also raise concern for metastatic disease. Dedicated CT of the abdomen and pelvis with   contrast is recommended for further evaluation of metastatic diseaseand possible primary source.   8. Emphysema and biapical scarring in the lungs without evidence forprimary tumor.      Labs:  Lab Results   Component Value Date    WBC 4.0 08/24/2016    RBC 4.66 08/24/2016    HGB 12.8 08/24/2016    HCT 40.3 08/24/2016    MCV 86.5 08/24/2016    MCHC 31.8 08/24/2016    RDW 14.6 08/24/2016    PLT 198 08/24/2016    MPV 9.9 08/24/2016    LYMPHS 29 08/24/2016    MONOS 5 08/24/2016    EOS 2 08/24/2016     Lab Results   Component Value Date    NA 140 07/26/2016    K 3.6 07/26/2016    CL 102 07/26/2016    BICARB 26 07/26/2016    BUN 17 07/26/2016    CREAT 0.71 07/26/2016    GLU 99 07/26/2016    [place] 9.0 07/26/2016     OSH Echo 05/01/16  - Left ventricle: The cavity size was normal. Systolic function was normal. The estimated ejection fraction was  50%. Diffuse  hypokinesis. Doppler parameters are consistent with abnormal leftventricular relaxation (grade 1 diastolic dysfunction).  - Aortic valve: There was mild regurgitation. Valve area (Vmax):1.81 cm^2.  - Mitral valve: There was mild regurgitation.  - Left atrium: The atrium was mildly dilated.  - Atrial septum: No defect or patent foramen ovale was identified.  Impressions:  - Mild LV systolic dysfunction with mild diastolic dysfunction.    PFT      Impression:  71 y/o M with history of multiple myeloma who presents for pulmonary evaluation of dyspnea:    Dyspnea:  ddx includes anemia vs asthma vs chronic aspiration with pneumonitis vs neuromuscular disease.  His PFTs are suggestive of restrictive disease possibly related to parenchymal disease (low DLCO; emphysematous changes noted on CT personally reviewed) vs neuromuscular disease given low MEF.  Anemia is also likely contributing to his dyspnea along with his multiple comorbid conditions.  I also suspect he is chronically aspirating. His prior pneumonia was likely due to aspiration. His chronic pain from lytic lesions is likely also contributing.     Recommendations:  Check full panel PFTs with neuro battery  Concur with speech therapy eval  Check rest and exercise O2 sats  Check BNP, repeat CBC with diff  TSH, T4  Phosphorus level  Check sniff test to assess diaphragm  May consider palliative care evaluation given patient's chronic pain.     Thank you Dr. Koura for requesting our consultation on this patient. If you have any questions, please do not hesitate to contact me.     I spent over 50 min on today's encounter, greater than 50% spent on face-to-face counseling.

## 2016-08-25 ENCOUNTER — Telehealth (HOSPITAL_BASED_OUTPATIENT_CLINIC_OR_DEPARTMENT_OTHER): Payer: Self-pay

## 2016-08-25 LAB — EPG, SERUM
A/G Ratio, EPG: 0.99
Albumin, EPG: 3.59 gm/dL (ref 3.20–5.00)
Alpha 1, EPG: 0.29 gm/dL (ref 0.10–0.40)
Alpha 2, EPG: 0.94 gm/dL (ref 0.60–1.10)
Beta, EPG: 1.03 gm/dL (ref 0.60–1.30)
Gamma, EPG: 1.35 gm/dL (ref 0.70–1.50)
Total Protein, EPG: 7.2 gm/dL (ref 6.00–8.00)

## 2016-08-25 LAB — EPG, INTERPRETATION, SERUM

## 2016-08-26 ENCOUNTER — Encounter (HOSPITAL_BASED_OUTPATIENT_CLINIC_OR_DEPARTMENT_OTHER): Payer: Self-pay

## 2016-08-26 LAB — IMMUNOFIXATION, BLOOD

## 2016-08-26 LAB — IMMUNOFIX INTERPRETATION, SERUM

## 2016-08-26 NOTE — Telephone Encounter (Signed)
Spoke to son about the MyChart message he sent. The pt is taking PT twice a week. He has not taken any pain medication for over a week. The son stated he was going to give the pt some pain medication for the next few days. He is to let us know if the pt does not get any relief.

## 2016-08-29 ENCOUNTER — Encounter (HOSPITAL_BASED_OUTPATIENT_CLINIC_OR_DEPARTMENT_OTHER): Payer: Self-pay

## 2016-08-30 ENCOUNTER — Other Ambulatory Visit (HOSPITAL_BASED_OUTPATIENT_CLINIC_OR_DEPARTMENT_OTHER): Payer: Self-pay | Admitting: Hematology & Oncology

## 2016-08-30 ENCOUNTER — Encounter (HOSPITAL_BASED_OUTPATIENT_CLINIC_OR_DEPARTMENT_OTHER): Payer: Self-pay

## 2016-08-30 DIAGNOSIS — C9000 Multiple myeloma not having achieved remission: Principal | ICD-10-CM

## 2016-08-30 MED ORDER — DEXAMETHASONE 4 MG OR TABS
20.0000 mg | ORAL_TABLET | ORAL | 3 refills | Status: DC
Start: 2016-09-05 — End: 2016-08-30

## 2016-08-30 MED ORDER — ASPIRIN 81 MG OR CHEW
81.0000 mg | CHEWABLE_TABLET | Freq: Every day | ORAL | 3 refills | Status: DC
Start: 2016-08-30 — End: 2016-09-21

## 2016-08-30 MED ORDER — ASPIRIN 81 MG OR TABS
81.0000 mg | ORAL_TABLET | Freq: Every day | ORAL | 3 refills | Status: DC
Start: 2016-08-30 — End: 2016-08-30

## 2016-08-30 MED ORDER — DEXAMETHASONE 4 MG OR TABS
20.0000 mg | ORAL_TABLET | ORAL | 3 refills | Status: DC
Start: 2016-09-05 — End: 2016-10-06

## 2016-08-30 NOTE — Telephone Encounter (Signed)
From: Alan [name]  To: Su Monks., MD  Sent: 08/30/2016 7:45 AM PST  Subject: Medication Renewal Request    Original authorizing provider: Su Monks, MD    Alan [name] would like a refill of the following medications:  aspirin 81 MG tablet Su Monks, MD]    Preferred pharmacy: [facility], [place], [address]    Comment:  Can you order chewable Aspirin

## 2016-08-30 NOTE — Telephone Encounter (Signed)
From: Alan [name]  To: Su Monks., MD  Sent: 08/30/2016 7:45 AM PST  Subject: Medication Renewal Request    Original authorizing provider: Su Monks, MD    Alan [name] would like a refill of the following medications:  dexamethasone (DECADRON) 4 MG tablet Su Monks, MD]    Preferred pharmacy: [facility], [place], [address]    Comment:  Needs Refill as my dad is taking 20mg  per week, which is 5 pills per week and we got 12 pills only.

## 2016-08-31 ENCOUNTER — Encounter (HOSPITAL_BASED_OUTPATIENT_CLINIC_OR_DEPARTMENT_OTHER): Payer: Self-pay

## 2016-09-01 ENCOUNTER — Encounter (HOSPITAL_BASED_OUTPATIENT_CLINIC_OR_DEPARTMENT_OTHER): Payer: Self-pay

## 2016-09-02 ENCOUNTER — Encounter (HOSPITAL_BASED_OUTPATIENT_CLINIC_OR_DEPARTMENT_OTHER): Payer: Self-pay

## 2016-09-02 NOTE — Procedures (Signed)
OUTSIDE SLIDE REVIEW  Received from [facility], [place] [address]  [phone]" collected on 03/25/2016  DIAGNOSIS:    BONE MARROW:  - Kappa-restricted monotypic plasma cells (90%) involving hypercellular  marrow with markedly decreased trilineage hematopoiesis (see Comment)  -Kappa-restricted monotypic B-cells (2.8%) identified by flow cytometry  (see Comment)    PERIPHERAL BLOOD:  - Normocytic normochromic moderate anemia  - Left-shifted granulocytes    COMMENT:  No suspicious lymphoid population was seen in the marrow due to the  overwhelming number of plasma cells.  The relationship between the  kappa-restricted monotypic plasma cells and kappa-restricted monotypic  B-cells is not clear but with the following two possibilities: 1. They are  not related; 2. They are related. With the positive BCL1 rearrangement, it  is most likely that the monotypic plasma cells and monotypic B-cells are  not related. Ideally BCL1 rearrangement studies should be performed on both  plasma cells and B-cells. Clinical correlation is recommended.  Dr. Kimberlee Nearing was notified of the results on Sep 02, 2016.    BRIEF CLINICAL HISTORY:  71 yo M diagnosed with IgG kappa MM 9/17.  He had significant pain from his  spine lesions and had palliative radiation from 03/24/16-04/11/16 from T9-T11  and exophytic masses.   Was then started on Revlimid as well as Velcade SQ  and dexamethasone    HEMOGRAM:  HGB 8.4 g/dL  PERIPHERAL SMEAR:  Red blood cells: Normocytic normochromic moderate anemia with normal  morphology, no Rouleaux formation  White blood cells: Within normal range with a normal differential.  Granulocytes show left-shifted maturation. There are no circulating plasma  cells  Platelets: Within normal range with normal morphology  ASPIRATE SMEAR:  None provided    TOUCH PREP:  Quality: Suboptimal due to limited marrow elements  Myeloid: Rarely seen.  Erythroid: Rarely seen.  Myeloid/Erythroid ratio: not  applicable  Megakaryocytes: Rarely seen.  Plasma cells: Rarely seen.  CORE BIOPSY:  Size: 0.6cm  Quality: Suboptimal due to aspirated marrow components  Cellularity: 10%  Myeloid: Decreased and rarely seen focally  Erythroid: Decreased and rarely seen focally  Megakaryocytes: Not seen  Plasma cells: Not increased in number with adequate morphology    CLOT SECTION:  Sections show large nodules of plasma cells not seen in the core  Quality: Adequate  Cellularity: 90%  Myeloid: Markedly decreased with normal maturation  Erythroid: Markedly decreased with normal maturation  Megakaryocytes: Decreased in number with normal morphology  Plasma cells: Sheets of plasma cells with scattered large and atypical  forms.  IRON STAIN:  Iron stain on clot shows no stainable iron.    SPECIAL STAINS:  On clot sections: CD138 highlights sheets of plasma cells comprising 90%  of total cells.  Kappa and lambda immunostains highlights kappa restricted  plasma cells.  FLOW CYTOMETRY:  Per report:  1) Monotypic B-cells (2.8%) detected.  CD19+, CD20+, CD22+, CD45+, CD5-,  CD10+/-, CD23-, FMC7+/-, CD103-, sKappa+  2) Rare monoclonal plasma cells (0.1%) detected. CD19-, CD20-, CD38  bright+, CD138+, cKappa+.  CYTOGENETICS:  Per report: 46,XY[20].  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 Please see report for  further information.    SPECIMEN(S) SUBMITTED:  Received from [facility], [place] [address] are 8 slides  labeled "[id number]" collected on 03/25/2016.  CONFIDENTIAL HEALTH INFORMATION: Health Care information is personal and  sensitive information. If it is being faxed to  you it is done so under  appropriate authorization from the patient or under circumstances that do  not require patient authorization. You, the recipient, are obligated to  maintain it in a safe, secure and confidential  manner. Re-disclosure  without additional patient consent or as permitted by law is prohibited.  Unauthorized re-disclosure or failure to maintain confidentiality could  subject you to penalties described in federal and state law.  If you have  received this report or facsimile in error, please notify the [facility]  [facility] Department immediately and destroy the received document(s).    Material reviewed and Interpreted and  Report Electronically Signed by:  Ashley Murrain M.D., Ph.D. [phone])  Attending Hematopathologist  09/02/16 16:54  Electronic Signature derived from a single  controlled access password

## 2016-09-04 ENCOUNTER — Encounter: Payer: Self-pay | Admitting: Internal Medicine

## 2016-09-06 ENCOUNTER — Telehealth: Payer: Self-pay | Admitting: Internal Medicine

## 2016-09-06 ENCOUNTER — Encounter (HOSPITAL_BASED_OUTPATIENT_CLINIC_OR_DEPARTMENT_OTHER): Payer: Self-pay

## 2016-09-06 NOTE — Telephone Encounter (Signed)
Spoke to pt's son [name]- patient currently on pomolyst 4mg  3 w-on and 1 w off; good control of M protein. However performance status still borderline. Continues to have the PEG tube. Weight 95 pounds. Recommend that pt stays back with family for now in [place]; and would be happy to take care of him in [place] when his performance status improves/ does not need much family support. Son agrees.

## 2016-09-07 ENCOUNTER — Telehealth (HOSPITAL_BASED_OUTPATIENT_CLINIC_OR_DEPARTMENT_OTHER): Payer: Self-pay

## 2016-09-07 NOTE — Telephone Encounter (Signed)
Called and spoke with pt's son, [name], regarding labs results received via [place] know, per Dr. Brand Males, to [facility] starting today d/t low blood counts. Pt is currently on Day 37 of [place] to have repeat labs in 1 week, and depending on results, we will provide further instructions regarding whether to continue or not. Understanding verbalized. [name] states that he will be out of the country for 2 weeks starting tomorrow 3/1, and that his brother [name] ([phone]) will be taking care of his father. No further questions at time of call.

## 2016-09-12 ENCOUNTER — Other Ambulatory Visit (HOSPITAL_BASED_OUTPATIENT_CLINIC_OR_DEPARTMENT_OTHER): Payer: Self-pay | Admitting: Hematology & Oncology

## 2016-09-12 MED ORDER — POMALIDOMIDE 4 MG PO CAPS
ORAL_CAPSULE | ORAL | Status: DC
Start: 2016-09-12 — End: 2017-09-04

## 2016-09-12 MED ORDER — POMALIDOMIDE 4 MG PO CAPS
ORAL_CAPSULE | ORAL | 0 refills | Status: DC
Start: 2016-09-12 — End: 2017-11-07

## 2016-09-14 ENCOUNTER — Telehealth (HOSPITAL_BASED_OUTPATIENT_CLINIC_OR_DEPARTMENT_OTHER): Payer: Self-pay

## 2016-09-14 NOTE — Telephone Encounter (Signed)
Spoke with pt's son, Alan [name], regarding pt's most recent labs drawn 09/12/16. Informed Alan [name] that his father blood counts have improved, and Dr. Brand Males would like for him to restart Pomalyst at current dose. Let him know that I spoke with [facility] who stated that they are currently waiting on the next shipment of Pomalyst to arrive, and once medication is here they will set up for delivery. Phone number of pt's other son, Alan [name], given to pharmacy in case they are unable to reach [place] as he is currently in [place], and only has reception sparingly. Instructed Alan [name] to make sure his father does not restart Pomalyst until he receives next shipment of medication, as he only has 1-2 pills on hand. Understanding verbalized, no further questions at time of call.

## 2016-09-15 ENCOUNTER — Ambulatory Visit (HOSPITAL_BASED_OUTPATIENT_CLINIC_OR_DEPARTMENT_OTHER): Payer: Medicare Other | Admitting: Hematology & Oncology

## 2016-09-19 ENCOUNTER — Other Ambulatory Visit (HOSPITAL_BASED_OUTPATIENT_CLINIC_OR_DEPARTMENT_OTHER): Payer: Self-pay | Admitting: Hematology & Oncology

## 2016-09-19 ENCOUNTER — Ambulatory Visit: Payer: Medicare Other | Attending: Hematology & Oncology | Admitting: Hematology & Oncology

## 2016-09-19 ENCOUNTER — Other Ambulatory Visit (HOSPITAL_BASED_OUTPATIENT_CLINIC_OR_DEPARTMENT_OTHER): Payer: Medicare Other

## 2016-09-19 ENCOUNTER — Encounter (HOSPITAL_BASED_OUTPATIENT_CLINIC_OR_DEPARTMENT_OTHER): Payer: Self-pay

## 2016-09-19 ENCOUNTER — Telehealth (HOSPITAL_BASED_OUTPATIENT_CLINIC_OR_DEPARTMENT_OTHER): Payer: Self-pay | Admitting: Registered"

## 2016-09-19 VITALS — BP 103/69 | HR 119 | Temp 97.8°F | Resp 30 | Ht 66.0 in | Wt 90.5 lb

## 2016-09-19 DIAGNOSIS — C9000 Multiple myeloma not having achieved remission: Principal | ICD-10-CM

## 2016-09-19 DIAGNOSIS — R634 Abnormal weight loss: Principal | ICD-10-CM | POA: Insufficient documentation

## 2016-09-19 DIAGNOSIS — K219 Gastro-esophageal reflux disease without esophagitis: Secondary | ICD-10-CM | POA: Insufficient documentation

## 2016-09-19 DIAGNOSIS — R0600 Dyspnea, unspecified: Secondary | ICD-10-CM | POA: Insufficient documentation

## 2016-09-19 DIAGNOSIS — R110 Nausea: Secondary | ICD-10-CM | POA: Insufficient documentation

## 2016-09-19 LAB — IMMUNOGLOBULIN PANEL (IGA,IGG,IGM), BLOOD
IGA: 97 mg/dL (ref 70–400)
IGG: 826 mg/dL (ref 700–1600)
IGM: 18 mg/dL — ABNORMAL LOW (ref 40–230)

## 2016-09-19 LAB — CBC WITH DIFF, BLOOD
ANC-Automated: 3.6 10*3/uL (ref 1.6–7.0)
ANC-Instrument: 3.6 10*3/uL (ref 1.6–7.0)
Abs Basophils: 0.1 10*3/uL (ref <0.1)
Abs Lymphs: 0.6 10*3/uL — ABNORMAL LOW (ref 0.8–3.1)
Abs Monos: 0.4 10*3/uL (ref 0.2–0.8)
Basophils: 2 %
Eosinophils: 1 %
Hct: 36.9 % — ABNORMAL LOW (ref 40.0–50.0)
Hgb: 11.7 gm/dL — ABNORMAL LOW (ref 13.7–17.5)
Lymphocytes: 12 %
MCH: 26.9 pg (ref 26.0–32.0)
MCHC: 31.7 g/dL — ABNORMAL LOW (ref 32.0–36.0)
MCV: 84.8 um3 (ref 79.0–95.0)
MPV: 10.4 fL (ref 9.4–12.4)
Monocytes: 9 %
Plt Count: 254 10*3/uL (ref 140–370)
RBC: 4.35 10*6/uL — ABNORMAL LOW (ref 4.60–6.10)
RDW: 15.4 % — ABNORMAL HIGH (ref 12.0–14.0)
Segs: 77 %
WBC: 4.7 10*3/uL (ref 4.0–10.0)

## 2016-09-19 LAB — COMPREHENSIVE METABOLIC PANEL, BLOOD
ALT (SGPT): 13 U/L (ref 0–41)
AST (SGOT): 15 U/L (ref 0–40)
Albumin: 3.5 g/dL (ref 3.5–5.2)
Alkaline Phos: 86 U/L (ref 40–129)
Anion Gap: 13 mmol/L (ref 7–15)
BUN: 14 mg/dL (ref 8–23)
Bicarbonate: 27 mmol/L (ref 22–29)
Bilirubin, Tot: 0.31 mg/dL (ref <1.2)
Calcium: 8.7 mg/dL (ref 8.5–10.6)
Chloride: 99 mmol/L (ref 98–107)
Creatinine: 0.65 mg/dL — ABNORMAL LOW (ref 0.67–1.17)
GFR: >60 mL/min
Glucose: 102 mg/dL — ABNORMAL HIGH (ref 70–99)
Potassium: 4 mmol/L (ref 3.5–5.1)
Sodium: 139 mmol/L (ref 136–145)
Total Protein: 6.4 g/dL (ref 6.0–8.0)

## 2016-09-19 LAB — KAPPA LAMBDA FREE LIGHT CHAIN W/ RATIO, BLOOD
Kappa Free Light Chains Quant: 30.1 mg/L — ABNORMAL HIGH (ref 3.3–19.4)
Kappa:Lambda Free Light Chain Ratio: 2.69 — ABNORMAL HIGH (ref 0.26–1.65)
Lambda Free Light Chains Quant: 11.2 mg/L (ref 5.7–26.3)

## 2016-09-19 LAB — PHOSPHORUS, BLOOD: Phosphorous: 3.1 mg/dL (ref 2.7–4.5)

## 2016-09-19 LAB — FREE THYROXINE, BLOOD: Free T4: 1.58 ng/dL (ref 0.93–1.70)

## 2016-09-19 LAB — TSH, BLOOD: TSH: 0.71 u[IU]/mL (ref 0.27–4.20)

## 2016-09-19 LAB — BNP, BLOOD: BNP: 49 pg/mL (ref <100)

## 2016-09-19 LAB — LDH, BLOOD: LDH: 156 U/L (ref 25–175)

## 2016-09-19 MED ORDER — ONDANSETRON 4 MG OR TBDP
4.0000 mg | ORAL_TABLET | Freq: Three times a day (TID) | ORAL | 1 refills | Status: DC | PRN
Start: 2016-09-19 — End: 2016-09-21

## 2016-09-19 MED ORDER — PANTOPRAZOLE SODIUM 40 MG OR PACK
40.0000 mg | PACK | Freq: Every day | ORAL | 2 refills | Status: DC
Start: 2016-09-19 — End: 2016-09-21

## 2016-09-19 MED ORDER — PANTOPRAZOLE SODIUM 40 MG OR PACK
PACK | ORAL | 2 refills | Status: DC
Start: 2016-09-19 — End: 2017-09-03

## 2016-09-19 MED ORDER — ONDANSETRON 4 MG OR TBDP
ORAL_TABLET | ORAL | 1 refills | Status: DC
Start: 2016-09-19 — End: 2017-09-03

## 2016-09-19 MED ORDER — ONDANSETRON 4 MG OR TBDP
ORAL_TABLET | ORAL | 1 refills | Status: AC
Start: 2016-09-19 — End: 2017-09-19

## 2016-09-19 MED ORDER — PANTOPRAZOLE SODIUM 40 MG OR PACK
PACK | ORAL | 2 refills | Status: AC
Start: 2016-09-19 — End: 2017-09-19

## 2016-09-19 NOTE — Patient Instructions (Addendum)
PLAN:  1. Labs here today.  2. Please call to schedule consult with Nutrition: [phone] .   3. Pulmonary function tests Friday.  4. Rx for ODT Zofran and chewable Aspirin sent to [facility].  5. Referral placed to GI. Please call to schedule an appt.  6. Take Colace and Senna today, then twice daily until bowel movement.  7. Continue to HOLD Pomalyst. Will discuss continuation at next clinic visit.  8. Return to clinic in 2-3 weeks.  9. Order to continue Physical Therapy by Home Health placed.    CASE MANAGERS:  Larey Days, RN   Kandis Nab, LVN    Phone # [phone]  Fax # [phone]  Pager # [phone]    After office hours MD on call line: [phone]. Ask to speak with BMT on call doctor     **Call to schedule, cancel, or reschedule clinic appointments: [phone]    [place]: [phone]  [place]: [phone]  Social Worker Lamonte Sakai): [phone]

## 2016-09-19 NOTE — Progress Notes (Signed)
BONE MARROW TRANSPLANT      Reason for Referral: transferring care from [name]    Primary Care Physician: Alan Mo, MD    Myeloma Data at Diagnosis:  Date of diagnosis:   Age: 71  Hgb:   Creat:   Alb:   Calcium:   B2M: 2.5  Bone marrow plasma cell %:   Cytogenetics/FISH:  Skeletal survey:    Free light chains: kappa 1005.1/lambda 7.1; ratio 141.56   SPEP/ M-protein: 3.1  IFE: IgG kappa  R-ISS:  ISS: unknown  Durie-Salmon:    Interval History:  Since his last appt - he completed C1 of pomalidomide.  His M-protein is now undetectable.    He is here today with a family friend and his other son, Alan [name].    He continues to lose weight, has lost about 8 lbs.  He is only taking Jevity 1.2 cal, twice a day.  He has no appetite.  Nausea continues.  He also notes a sensation of food getting stuck in his chest. He is extremely fatigued.  He is not sure these are related to pomalidomide as he was having symptoms prior to starting, and the symptoms have not improved on this week off pom.     He remains largely immobile due to his weakness.  He continues to c/o constipation. No BM x 3 days.  He does not like taking medication so has not been taking senna or colace.    His SOB continues. He was seen by pulmonary, Alan [name], and is scheduled for full PFTs this Friday.  He still gets SOB even speaking to me.  He did have a speech therapy evaluation (home health) and did not appear to be aspirating.     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  He had an abnl chest X-ray which lead to a chest CT which noted multiple lytic lesions in thoracic vertebrae, as well as compression fractures.  He was referred to [name] for further evaluation.  He had a bone marrow biopsy that showed MM.  He had significant pain from his spine lesions and had palliative radiation from 03/24/16-04/11/16 from T9-T11 and exophytic masses.  His pain  resolved once he finished radiation therapy.      He was then started on Revlimid 20 mg po daily 2 weeks on 1 week off as well as Velcade SQ and dexamethasone    He had several hospitalizations for infection, difficulty with weight gain, eating.  He was placed on TPN and then transitioned to tube feeds by 11/17.      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  At this point, he's off of dexamethasone.  He is taking Revlimid 5 mg po daily 1 week on 1 week off.    Given his significant debilitation and illness, he moved to [name] to have more support/caregiving.  He and his wife have moved in with their son, Alan [name] in [place]. They have another son and daughter in [place].    He is here in a wheelchair, but at home walks with a walker though cannot manage long distances over 100 steps or so  He is eating although still supplementing with tube feeds as can only manage some soups/lentils 3x/day    Hospitalizations and significant events:   9/26-10/4: Admitted with N/V and  failure to thrive, felt to have gastritis  10/16-10/25: Admitted with aspiration pneumonia; tx with vanc/zosyn/azithro  05/18/16: PEG tube placed  06/18/16: ER visit for abdominal pain at PEG site    Review of Systems:  General: No fever, chills.  +fatigued very easily  Eyes: No icterus, vision change, double vision, eye pain, blurry vision, or floaters.   Oral/throat: No oral pain, oral ulcers or lesions, tooth pain, sore throat. +difficulty swallowing pills especially  Nose: No nasal discharge, sinus pain.   Ears: No change in hearing or ear pain.   Neck: No pain, no masses.  Cardiac: No chest pain or pressure, palpitations, or edema.   Pulmonary: No chest pain, +dyspnea on exertion  Abdomen: No pain, bloating, +nausea several times/week, vomiting, diarrhea, +occasional constipation - takes MOM, melena, or blood per rectum.   Genitourinary: No dysuria, change in urinary  frequency.   Extremities: No swelling, pain +numbness in feet, also fingertips.   Skin: No rash, lesions, pruritus.   Neurologic:  No headache, numbness, +general weakness, balance problems.   Psych: Normal mood     Past Medical/Surgical Hx:   1. Multiple myeloma  2. Retinal detachment 2006    Social History:   He is married. He and his wife moved from [place] to the [place] in 2002.  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  Since 2010 he's been very involved with an ashram in [place], Alan [name].  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    He denies EtOH/never smoker/no smokeless tobacco.  He is vegetarian  He has 3 children, 2 sons and 1 daughter    Family History:   He has no known family h/o cancer.  Father died when patient was only 3 mos but he is not aware of cause    Medications:    Current Outpatient Prescriptions:     acyclovir (ZOVIRAX) 400 MG tablet, Take 1 tablet (400 mg) by mouth 2 times daily., Disp: 60 tablet, Rfl: 3    aspirin 81 MG chewable tablet, Take 1 tablet (81 mg) by mouth daily., Disp: 30 tablet, Rfl: 3    dexamethasone (DECADRON) 4 MG tablet, Take 5 tablets (20 mg) by mouth once a week., Disp: 20 tablet, Rfl: 3    docusate sodium (COLACE) 250 MG capsule, Take 1 capsule (250 mg) by mouth 2 times daily., Disp: 60 capsule, Rfl: 2    HYDROcodone-acetaminophen (NORCO) 5-325 MG tablet, Take 1 tablet by mouth every 6 hours as needed for Moderate Pain (Pain Score 4-6)., Disp: , Rfl:     ondansetron (ZOFRAN ODT) 4 MG disintegrating tablet, Take 1 tablet (4 mg) by mouth every 8 hours as needed for Nausea., Disp: 30 tablet, Rfl: 1    pantoprazole (PROTONIX) 40 MG packet, Take 40 mg by mouth daily., Disp: 1 packet, Rfl: 2    Pomalidomide 4 MG CAPS, Take 4 mg by mouth daily. Take 1 tab po daily on days 1-21 of a 28 day cycle, Disp: 21 capsule, Rfl: 2    senna (SENOKOT) 8.6 MG tablet, Take 1 tablet (8.6 mg) by mouth 2 times daily as needed for Constipation.,  Disp: 120 tablet, Rfl: 3    Allergies:  Review of patient's allergies indicates no known allergies.    Objective:   09/19/16  1123   BP: 103/69   Pulse: 119   Resp: 30   Temp: 97.8 F (36.6 C)   SpO2: 98%  Physical Exam:   Karnofsky performance status: 50  General: Thin, chronically ill appearing male, no distress - in a wheelchair  HEENT: EOMI, PERRL, sclera anicteric, conjunctiva pink and moist, oral cavity without lesions or ulcers, tonsils normal   Neck: Supple, no lymphadenopathy   Lungs: Clear to auscultation bilaterally, no wheezes, rubs, or rales   Cardiac: No JVD, tachycardic, normal rhythm, normal S1, S2, no murmurs or gallops   Abdomen: Not distended, normal bowel sounds, soft, non-tender, no organomegaly, exam limited in wheelchair as weak to transfer  Extremities: Warm, well perfused, no cyanosis, no clubbing, no edema   Skin: No jaundice, no petechiae, no purpura   Lymph: No lymphadenopathy at cervical, supraclavicular, axillary, or inguinal lymph nodes   Neurologic: Awake, alert, oriented.  +BLE weakness R>L    Lab results:  Lab Results   Component Value Date    WBC 4.7 09/19/2016    RBC 4.35 09/19/2016    HGB 11.7 09/19/2016    HCT 36.9 09/19/2016    MCV 84.8 09/19/2016    MCHC 31.7 09/19/2016    RDW 15.4 09/19/2016    PLT 254 09/19/2016    MPV 10.4 09/19/2016    SEG 77 09/19/2016    LYMPHS 12 09/19/2016    MONOS 9 09/19/2016    EOS 1 09/19/2016    BASOS 2 09/19/2016     Lab Results   Component Value Date    NA 139 09/19/2016    K 4.0 09/19/2016    CL 99 09/19/2016    BICARB 27 09/19/2016    BUN 14 09/19/2016    CREAT 0.65 09/19/2016    GLU 102 09/19/2016     8.7 09/19/2016     Lab Results   Component Value Date    AST 15 09/19/2016    ALT 13 09/19/2016    LDH 156 09/19/2016    ALK 86 09/19/2016    TP 6.4 09/19/2016    ALB 3.5 09/19/2016    TBILI 0.31 09/19/2016     OSH Labs:   03/16/2016 04/27/2016 06/07/2016 06/18/2016   kappa 1005.1 44.4 136.9 142.7   lambda 7.1 5.4 23.4 24.4   k/l 141.56  8.22 5.85 5.85   IgG 3817 1132 1390    M-spike 3.1 0.8 0.7      Results for Alan [name], Alan [name] (MRN [id number]) as of 08/10/2016 15:03   Ref. Range 07/26/2016 11:55 08/24/2016 09/19/2016   M-Spike 1 (Gamma) Latest Ref Range: 0.00 - 0.01 gm/dL 1.15 (H)     IGA Latest Ref Range: 70 - 400 mg/dL 63 (L) 68 97   IGG Latest Ref Range: 700 - 1600 mg/dL 1773 (H) 1242 826   IGM Latest Ref Range: 40 - 230 mg/dL 12 (L) 26 18   Kappa Free Light Chains Quant Latest Ref Range: 3.3 - 19.4 mg/L 120.3 (H) 72.5 30.1   Lambda Free Light Chains Quant Latest Ref Range: 5.7 - 26.3 mg/L 13.4 12.5 11.2   Kappa:Lambda Free Light Chain Ratio Latest Ref Range: 0.26 - 1.65  8.98 (H) 5.8 2.69       OSH results:  CT chest w/o contrast 03/15/16  IMPRESSION:   1. Multiple lytic lesions throughout the thoracic spine and ribs asdescribed suggesting metastatic disease.   2. The lesion evident on the chest x-ray corresponds with a 5.7 x 3.3 x 4.5 cm mass infiltrating the left T9 rib with extension to the   left T8-9 foramen but no invasion of the foramen.   3.  4.1 x 3.1 cm exophytic mass lesion at T10 with pathologicfracture. The tumor extends into the spinal canal with moderate   central canal stenosis and right greater than left foraminalnarrowing at T9-10 and T10-11.   4. Prominent infiltrative tumor posteriorly at T11 withoutcompression fracture.   5. Additional pathologic fracture within the more distal left ninthrib.   6. Marked osteopenia with probable hemangioma is an high suspicion for multiple additional lesions throughout the thoracic spine.   Recommend MRI of the thoracic spine without and with contrast for further evaluation   7. Multiple hypodense lesions within the liver also raise concern for metastatic disease. Dedicated CT of the abdomen and pelvis with   contrast is recommended for further evaluation of metastatic diseaseand possible primary source.   8. Emphysema and biapical scarring in the lungs without evidence  forprimary tumor.    Bone Survey 03/16/16  1. There is probable pathologic fracture of T10 vertebral body suspicious for bony lesion. Expansile lesion of left posterior eighth rib suspicious for metastatic disease.     PET 03/22/16  1. Widespread lytic lesions throughout the visualized axial and appendicular skeleton, compatible with the reported clinical history  of multiple myeloma. This is most notable for the lytic lesion with pathologic compression fracture in the T10 vertebral body. No  definite extra-skeletal involvement noted on today's examination, which was limited by lack of acquisition of diagnostic PET images  secondary to patient pain during the examination.  2. Aortic atherosclerosis.  3. Additional incidental findings, as above.      Pathology:  OSH BMBx 03/25/16  1. Plasma cell neoplasm with mildy atypical plasma cells (>90% of the sampled marrow in the clot sections)  2. Abnormal FISH result: CCND1/IGH translocation with aneuploidy - t(11;14).  Aneuploid for all probes.  No evidence of t(4;14) or t(14;16)  3. Abnormal SNP microarray result, see separate report for details    Karyotype: 46,XY[20]  MM FISH: CCND1/IGH translocation with aneuploidy - t(11;14)    ASSESSMENT AND PLAN:  71 yo M with IgG kappa multiple myeloma.  He has been on therapy with RVD, but due to multiple hospitalizations, has been unable to tolerate full dose therapy.  We started on pomalidomide 4 mg po days 1-21 along with weekly dexamethasone 20 mg --> M-spike now 0 after 1 cycle.     #IgG kappa MM:   From his exam and presentation, appears he may have had cord compression at diagnosis. His BLE are weak. However, he notes this is how it has been since diagnosis.  At this point, already radiated and had therapy, so no further spine imaging for now unless new symptoms.   Given his debilitation I am concerned he will not tolerate triplet therapy.     He did not tolerate even a small increase in his Rev dosing, and yet his M-spike  is rising. I think we need to change therapy.  I would rather a faster response, so will try pom/dex rather than ix/dex.  Did also discuss infusion options such as carfilzomib - but as he already has dyspnea I think this will be problematic.  Daratumumab is also an option, however given travel and distance - will attempt oral therapy first if possible     We did one cycle of full dose pom/dex, which did not seem to worsen symptoms - and did clear his M-spike with 1 cycle. However, he continues to be quite debilitated and almost bed bound.     Continue Zometa q3 mos -  reminded him to schedule as he has not had a dose here yet    #B12 deficiency:   Per outside records previously deficient, tx with B12 injections, now appears resolved    #Dysphagia to solids:   Referral to speech, especially in setting of hospitalization for aspiration PNA 10/17 - has already seen at home and apparently no concern for aspiration    #Dyspnea:   Appreciate pulmonary consult, will get full PFTs this weekend    #Malnutrition:   ?due to dysphagia/failure to thrive   Per patient's sons, were previously doing continuous TF with pump at 45 ml/hr. Requested pump for home and will need continued f/u with nutrition for adjustments of TF    #Debilitation:   Suspect some of his weakness is sequelae of cord compression   HH for PT/OT - reordered as patient had seen them then sent them away    #Constipation:   Scheduled colace bid   Prn senna, prn MOM      Plan:  GI referral for globus sensation - also eval given h/o food intolerance/nausea  Continue f/u with nutrition  PT/OT requested again  Zofran ODT for sx of nausea - is actively vomiting after his clinic appt  Reminded to schedule Zometa    In future would benefit from palliative care c/s for pain and constipation symptom management though currently there are several other referrals he should f/u on.      Long discussion with patient, his son and family friend.  I do not think his  symptoms are related to myeloma therapy as he's had them prior and after, and decreasing doses of lenalidomide versus full dose of pomalidomide did not make a difference.  In addition, he's now been off pomalidomide x 1 week and no change.  However, he is severely debilitated.  Given myeloma much better, also do not think this is contributing now to his clinical picture.    I believe we should HOLD pom/dex for another 1-2 weeks and assess if he is any stronger, nausea better.  If he is no better, could consider resuming pom/dex in order to deepen response and remission. However, if symptoms are actually better, would seriously need to consider holding therapy and observing, then only resuming therapy when progresses again to manage side effects.    HOLD pom/dex    RTC in 2-3 weeks to see if sx improved and discuss further management of myeloma

## 2016-09-19 NOTE — Interdisciplinary (Signed)
Wellbeing screening assessment reviewed, patient declined printed materials or referrals at this time. Will refer to social work if needed

## 2016-09-19 NOTE — Telephone Encounter (Signed)
New Internal referral for Nutrition. Called patient to schedule, no answer and left detailed message with my direct number.

## 2016-09-20 ENCOUNTER — Telehealth (INDEPENDENT_AMBULATORY_CARE_PROVIDER_SITE_OTHER): Payer: Self-pay | Admitting: Pulmonary Disease

## 2016-09-20 ENCOUNTER — Encounter (HOSPITAL_BASED_OUTPATIENT_CLINIC_OR_DEPARTMENT_OTHER): Payer: Self-pay

## 2016-09-20 LAB — EPG, SERUM
A/G Ratio, EPG: 1.08
Albumin, EPG: 3.32 gm/dL (ref 3.20–5.00)
Alpha 1, EPG: 0.3 gm/dL (ref 0.10–0.40)
Alpha 2, EPG: 0.88 gm/dL (ref 0.60–1.10)
Beta, EPG: 0.99 gm/dL (ref 0.60–1.30)
Gamma, EPG: 0.9 gm/dL (ref 0.70–1.50)
Total Protein, EPG: 6.4 gm/dL (ref 6.00–8.00)

## 2016-09-20 LAB — EPG, INTERPRETATION, SERUM

## 2016-09-20 LAB — IMMUNOFIX INTERPRETATION, SERUM

## 2016-09-20 LAB — IMMUNOFIXATION, BLOOD

## 2016-09-20 NOTE — Telephone Encounter (Signed)
Sent patient mychart message with results noted below per MD     Miquel Dunn Annice Needy, MD  Sherri Sear                Please inform patient BNP and thyroid studies are within normal limits. Thanks.

## 2016-09-21 ENCOUNTER — Other Ambulatory Visit (HOSPITAL_BASED_OUTPATIENT_CLINIC_OR_DEPARTMENT_OTHER): Payer: Self-pay | Admitting: Hematology & Oncology

## 2016-09-21 DIAGNOSIS — R110 Nausea: Secondary | ICD-10-CM

## 2016-09-21 DIAGNOSIS — C9000 Multiple myeloma not having achieved remission: Principal | ICD-10-CM

## 2016-09-21 DIAGNOSIS — K219 Gastro-esophageal reflux disease without esophagitis: Secondary | ICD-10-CM

## 2016-09-21 LAB — VITAMIN D, 25-OH TOTAL
Vitamin D, 25-OH D2: 11 ng/mL
Vitamin D, 25-OH D3: 21 ng/mL
Vitamin D, 25-OH TOTAL: 32 ng/mL (ref 30–80)

## 2016-09-21 MED ORDER — PANTOPRAZOLE SODIUM 40 MG OR PACK
40.0000 mg | PACK | Freq: Every day | ORAL | 3 refills | Status: DC
Start: 2016-09-21 — End: 2016-12-23

## 2016-09-21 MED ORDER — ONDANSETRON 4 MG OR TBDP
4.0000 mg | ORAL_TABLET | Freq: Three times a day (TID) | ORAL | 3 refills | Status: DC | PRN
Start: 2016-09-21 — End: 2016-12-23

## 2016-09-21 MED ORDER — ASPIRIN 81 MG OR CHEW
81.0000 mg | CHEWABLE_TABLET | Freq: Every day | ORAL | 3 refills | Status: DC
Start: 2016-09-21 — End: 2016-12-23

## 2016-09-21 NOTE — Addendum Note (Signed)
Addended byClancy Gourd on: 09/21/2016 11:29 AM     Modules accepted: Orders

## 2016-09-23 ENCOUNTER — Ambulatory Visit
Admission: RE | Admit: 2016-09-23 | Discharge: 2016-09-23 | Disposition: A | Discharge status: Home / Self Care | Payer: Medicare Other | Source: Ambulatory Visit | Attending: Pulmonary Disease | Admitting: Pulmonary Disease

## 2016-09-23 ENCOUNTER — Ambulatory Visit (HOSPITAL_BASED_OUTPATIENT_CLINIC_OR_DEPARTMENT_OTHER)
Admit: 2016-09-23 | Discharge: 2016-09-23 | Disposition: A | Discharge status: Home Health | Payer: Medicare Other | Source: Ambulatory Visit | Attending: Pulmonary Disease | Admitting: Pulmonary Disease

## 2016-09-23 ENCOUNTER — Encounter (HOSPITAL_BASED_OUTPATIENT_CLINIC_OR_DEPARTMENT_OTHER): Payer: Self-pay

## 2016-09-23 DIAGNOSIS — R0600 Dyspnea, unspecified: Principal | ICD-10-CM | POA: Insufficient documentation

## 2016-09-23 DIAGNOSIS — R942 Abnormal results of pulmonary function studies: Secondary | ICD-10-CM

## 2016-09-23 MED ORDER — ALBUTEROL SULFATE 108 (90 BASE) MCG/ACT IN AERS
4.00 | INHALATION_SPRAY | Freq: Once | RESPIRATORY_TRACT | Status: AC
Start: 2016-09-23 — End: 2016-09-23
  Administered 2016-09-23: 2 via RESPIRATORY_TRACT
  Filled 2016-09-23: qty 6.7

## 2016-09-23 NOTE — Procedures (Signed)
Patient unable to walk on treadmill due to weakness. ABG attempted, sample not obtained.

## 2016-09-23 NOTE — Procedures (Signed)
ABG attempted, sample not obtained. 2 techs.

## 2016-09-23 NOTE — Procedures (Signed)
Patient appeared tired t/o test. MD paged for question on pft, neuro protocol? Patient reported he is very weak and requested to lay down because he could not tolerate sitting in a chair much longer. Attempted bronchodilator administration and patient reported it is very difficult to hold his breath after inhalation. Only spirometry pre and post bronchodilator done. Height and weight meausured on our scale here in the lab.

## 2016-09-25 ENCOUNTER — Telehealth (INDEPENDENT_AMBULATORY_CARE_PROVIDER_SITE_OTHER): Payer: Self-pay | Admitting: Pulmonary Disease

## 2016-09-25 DIAGNOSIS — R0600 Dyspnea, unspecified: Principal | ICD-10-CM

## 2016-09-25 NOTE — Telephone Encounter (Signed)
Please inform patient PFTs suggest possible neuromuscular weakness; thus I have ordered PFTs with neuromuscular battery to be performed in PFT lab.

## 2016-09-26 NOTE — Telephone Encounter (Signed)
RN spoke with pt's son Alan [name] regarding message/recommendation from Dr. Miquel Dunn. PFT sch info given. Verbally understands.     Complete PFT with full Bronch Eval [PFT2] (Order [id number])   [facility]:   [place]: [phone]

## 2016-09-28 ENCOUNTER — Telehealth (HOSPITAL_BASED_OUTPATIENT_CLINIC_OR_DEPARTMENT_OTHER): Payer: Self-pay

## 2016-09-28 NOTE — Telephone Encounter (Signed)
Called and left VM for pt's son/ caregiver, [name]. Let [name] know that I was told by [facility] that his father refused labs twice this week, and that we would like for him to have labs drawn. Let him know that I also want to know how his father has been feeling symptom- wise, since continuing to [facility]. Requested call back. Awaiting call back at this time.

## 2016-10-04 ENCOUNTER — Encounter (INDEPENDENT_AMBULATORY_CARE_PROVIDER_SITE_OTHER): Payer: Medicare Other | Admitting: Pulmonary Disease

## 2016-10-06 ENCOUNTER — Ambulatory Visit: Payer: Medicare Other | Attending: Hematology & Oncology | Admitting: Hematology & Oncology

## 2016-10-06 VITALS — BP 119/82 | HR 112 | Temp 98.3°F | Resp 16 | Ht 66.0 in | Wt 89.2 lb

## 2016-10-06 DIAGNOSIS — C9000 Multiple myeloma not having achieved remission: Principal | ICD-10-CM | POA: Insufficient documentation

## 2016-10-06 NOTE — Patient Instructions (Addendum)
PLAN:  1. Please schedule Nutrition Consult [phone]). Try to schedule on 4/13 or when you see [place] next.  2. F/u with Dr. Miquel Dunn of Pulmonary.  3. Zometa infusion Friday 4/13 @ 1020a. OK to wait to have labs drawn here that week.  4. Continue to HOLD Pomalyst and Dexamethasone.  5. Return to clinic on Mon 4/30 with labs done here 2 hrs prior to appt.        CASE MANAGERS:  Larey Days, RN   Kandis Nab, LVN    Phone # [phone]  Fax # [phone]  Pager # [phone]    After office hours MD on call line: [phone]. Ask to speak with BMT on call doctor     **Call to schedule, cancel, or reschedule clinic appointments: [phone]    [place]: [phone]  [place]: [phone]  Social Worker Lamonte Sakai): [phone]

## 2016-10-06 NOTE — Progress Notes (Signed)
BONE MARROW TRANSPLANT      Diagnosis: Multiple myeloma    Primary Care Physician: Bobette Mo, MD    Myeloma Data at Diagnosis:  Date of diagnosis:   Age: 71  Hgb:   Creat:   Alb:   Calcium:   B2M: 2.5  Bone marrow plasma cell %:   Cytogenetics/FISH:  Skeletal survey:    Free light chains: kappa 1005.1/lambda 7.1; ratio 141.56   SPEP/ M-protein: 3.1  IFE: IgG kappa  R-ISS:  ISS: unknown  [place]:    Interval History:  Since his last appt - he has been off pom/dex per my instructions.  He says he feels better - stronger.  Notes he's been able to use a walker and get around home better.  He's also been less nauseated and feels he is eating more.  He has had no vomiting. The sensation of food getting stuck may be slightly better.  His main complaint remains his SOB - he does not feel it is worse, though to me and his son Verdie Drown he appears to have more rapid shallow breathing today.      Oncologic History:  Mr. 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  He had an abnl chest X-ray which lead to a chest CT which noted multiple lytic lesions in thoracic vertebrae, as well as compression fractures.  He was referred to Dr. Charlaine Dalton for further evaluation.  He had a bone marrow biopsy that showed MM.  He had significant pain from his spine lesions and had palliative radiation from 03/24/16-04/11/16 from T9-T11 and exophytic masses.  His pain resolved once he finished radiation therapy.      He was then started on Revlimid 20 mg po daily 2 weeks on 1 week off as well as Velcade SQ and dexamethasone    He had several hospitalizations for infection, difficulty with weight gain, eating.  He was placed on TPN and then transitioned to tube feeds by 11/17.      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  At this point, he's off of dexamethasone.  He is taking Revlimid 5 mg po daily 1 week on 1 week  off.    Given his significant debilitation and illness, he moved to [facility] to have more support/caregiving.  He and his wife have moved in with their son, [name] in [place]. They have another son Verdie Drown) and daughter in [place].    Hospitalizations and significant events:   9/26-10/4: Admitted with N/V and failure to thrive, felt to have gastritis  10/16-10/25: Admitted with aspiration pneumonia; tx with vanc/zosyn/azithro  05/18/16: PEG tube placed  06/18/16: ER visit for abdominal pain at PEG site    07/2016: Attempted lenalidomide 10 mg (was on 5 mg every other week prior to moving).  Did not tolerate so went to 10 mg every other day until complete  08/2016: Changed to pom/dex with pomalidomide 4 mg po daily days 1-21 and dex 20 mg po weekly.  Tolerance was about the same as lenalidomide.  But M-spike responded.  09/2016: Took him off of pom/dex after only 1 cycle due to significant frailty and no improvement with improvement in myeloma.  Not clear pom/dex is actually what is causing his debilitation/frailty/failure to thrive, but feel like he is too frail to consider continued therapy when myeloma has responded.  Review of Systems:  General: No fever, chills.  +fatigued very easily  Eyes: No icterus, vision change, double vision, eye pain, blurry vision, or floaters.   Oral/throat: No oral pain, oral ulcers or lesions, tooth pain, sore throat. +difficulty swallowing pills especially  Nose: No nasal discharge, sinus pain.   Ears: No change in hearing or ear pain.   Neck: No pain, no masses.  Cardiac: No chest pain or pressure, palpitations, or edema.   Pulmonary: No chest pain, +dyspnea on exertion  Abdomen: No pain, bloating, +nausea several times/week, vomiting, diarrhea, +occasional constipation - takes MOM, melena, or blood per rectum.   Genitourinary: No dysuria, change in urinary frequency.   Extremities: No swelling, pain +numbness in feet, also fingertips.   Skin: No rash, lesions, pruritus.    Neurologic:  No headache, numbness, +general weakness, balance problems.   Psych: Normal mood     Past Medical/Surgical Hx:   1. Multiple myeloma  2. Retinal detachment 2006    Social History:   He is married. He and his wife moved from [place] to the [place] in 2002.  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  Since 2010 he's been very involved with an ashram in [place], [place].  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    He denies EtOH/never smoker/no smokeless tobacco.  He is vegetarian  He has 3 children, 2 sons and 1 daughter    Family History:   He has no known family h/o cancer.  Father died when patient was only 3 mos but he is not aware of cause    Medications:    Current Outpatient Prescriptions:     acyclovir (ZOVIRAX) 400 MG tablet, Take 1 tablet (400 mg) by mouth 2 times daily., Disp: 60 tablet, Rfl: 3    aspirin 81 MG chewable tablet, Take 1 tablet (81 mg) by mouth daily., Disp: 30 tablet, Rfl: 3    dexamethasone (DECADRON) 4 MG tablet, Take 5 tablets (20 mg) by mouth once a week., Disp: 20 tablet, Rfl: 3    docusate sodium (COLACE) 250 MG capsule, Take 1 capsule (250 mg) by mouth 2 times daily., Disp: 60 capsule, Rfl: 2    HYDROcodone-acetaminophen (NORCO) 5-325 MG tablet, Take 1 tablet by mouth every 6 hours as needed for Moderate Pain (Pain Score 4-6)., Disp: , Rfl:     ondansetron (ZOFRAN ODT) 4 MG disintegrating tablet, Take 1 tablet (4 mg) by mouth every 8 hours as needed for Nausea., Disp: 30 tablet, Rfl: 3    pantoprazole (PROTONIX) 40 MG packet, Take 40 mg by mouth daily., Disp: 1 packet, Rfl: 3    Pomalidomide 4 MG CAPS, Take 4 mg by mouth daily. Take 1 tab po daily on days 1-21 of a 28 day cycle, Disp: 21 capsule, Rfl: 2    senna (SENOKOT) 8.6 MG tablet, Take 1 tablet (8.6 mg) by mouth 2 times daily as needed for Constipation., Disp: 120 tablet, Rfl: 3    Allergies:  Review of patient's allergies indicates no known  allergies.    Objective:  There were no vitals filed for this visit.    Physical Exam:   Karnofsky performance status: 50  General: Thin, chronically ill appearing male, no distress - in a wheelchair  HEENT: EOMI, PERRL, sclera anicteric, conjunctiva pink and moist, oral cavity without lesions or ulcers, tonsils normal   Neck: Supple, no lymphadenopathy   Lungs: Clear to auscultation bilaterally,  no wheezes, rubs, or rales.  Tachypneic  Cardiac: No JVD, tachycardic, normal rhythm, normal S1, S2, no murmurs or gallops   Abdomen: Not distended, normal bowel sounds, soft, non-tender, no organomegaly, exam limited in wheelchair as weak to transfer  Extremities: Warm, well perfused, no cyanosis, no clubbing, no edema   Skin: No jaundice, no petechiae, no purpura   Lymph: No lymphadenopathy at cervical, supraclavicular, axillary, or inguinal lymph nodes   Neurologic: Awake, alert, oriented.  +BLE weakness R>L    Lab results:  Lab Results   Component Value Date    WBC 4.7 09/19/2016    RBC 4.35 09/19/2016    HGB 11.7 09/19/2016    HCT 36.9 09/19/2016    MCV 84.8 09/19/2016    MCHC 31.7 09/19/2016    RDW 15.4 09/19/2016    PLT 254 09/19/2016    MPV 10.4 09/19/2016    SEG 77 09/19/2016    LYMPHS 12 09/19/2016    MONOS 9 09/19/2016    EOS 1 09/19/2016    BASOS 2 09/19/2016     Lab Results   Component Value Date    NA 139 09/19/2016    K 4.0 09/19/2016    CL 99 09/19/2016    BICARB 27 09/19/2016    BUN 14 09/19/2016    CREAT 0.65 09/19/2016    GLU 102 09/19/2016    [place] 8.7 09/19/2016     Lab Results   Component Value Date    AST 15 09/19/2016    ALT 13 09/19/2016    LDH 156 09/19/2016    ALK 86 09/19/2016    TP 6.4 09/19/2016    ALB 3.5 09/19/2016    TBILI 0.31 09/19/2016     OSH Labs:   03/16/2016 04/27/2016 06/07/2016 06/18/2016   kappa 1005.1 44.4 136.9 142.7   lambda 7.1 5.4 23.4 24.4   k/l 141.56 8.22 5.85 5.85   IgG 3817 1132 1390    M-spike 3.1 0.8 0.7      Results for TILLMON, KISLING (MRN [id number]) as of 08/10/2016  15:03   Ref. Range 07/26/2016 11:55 08/24/2016 09/19/2016   M-Spike 1 (Gamma) Latest Ref Range: 0.00 - 0.01 gm/dL 1.15 (H)     IGA Latest Ref Range: 70 - 400 mg/dL 63 (L) 68 97   IGG Latest Ref Range: 700 - 1600 mg/dL 1773 (H) 1242 826   IGM Latest Ref Range: 40 - 230 mg/dL 12 (L) 26 18   Kappa Free Light Chains Quant Latest Ref Range: 3.3 - 19.4 mg/L 120.3 (H) 72.5 30.1   Lambda Free Light Chains Quant Latest Ref Range: 5.7 - 26.3 mg/L 13.4 12.5 11.2   Kappa:Lambda Free Light Chain Ratio Latest Ref Range: 0.26 - 1.65  8.98 (H) 5.8 2.69       OSH results:  CT chest w/o contrast 03/15/16  IMPRESSION:   1. Multiple lytic lesions throughout the thoracic spine and ribs asdescribed suggesting metastatic disease.   2. The lesion evident on the chest x-ray corresponds with a 5.7 x 3.3 x 4.5 cm mass infiltrating the left T9 rib with extension to the   left T8-9 foramen but no invasion of the foramen.   3. 4.1 x 3.1 cm exophytic mass lesion at T10 with pathologicfracture. The tumor extends into the spinal canal with moderate   central canal stenosis and right greater than left foraminalnarrowing at T9-10 and T10-11.   4. Prominent infiltrative tumor posteriorly at T11 withoutcompression fracture.   5. Additional pathologic  fracture within the more distal left ninthrib.   6. Marked osteopenia with probable hemangioma is an high suspicion for multiple additional lesions throughout the thoracic spine.   Recommend MRI of the thoracic spine without and with contrast for further evaluation   7. Multiple hypodense lesions within the liver also raise concern for metastatic disease. Dedicated CT of the abdomen and pelvis with   contrast is recommended for further evaluation of metastatic diseaseand possible primary source.   8. Emphysema and biapical scarring in the lungs without evidence forprimary tumor.    Bone Survey 03/16/16  1. There is probable pathologic fracture of T10 vertebral body suspicious for bony lesion.  Expansile lesion of left posterior eighth rib suspicious for metastatic disease.     PET 03/22/16  1. Widespread lytic lesions throughout the visualized axial and appendicular skeleton, compatible with the reported clinical history  of multiple myeloma. This is most notable for the lytic lesion with pathologic compression fracture in the T10 vertebral body. No  definite extra-skeletal involvement noted on today's examination, which was limited by lack of acquisition of diagnostic PET images  secondary to patient pain during the examination.  2. Aortic atherosclerosis.  3. Additional incidental findings, as above.      Pathology:  OSH BMBx 03/25/16  1. Plasma cell neoplasm with mildy atypical plasma cells (>90% of the sampled marrow in the clot sections)  2. Abnormal FISH result: CCND1/IGH translocation with aneuploidy - t(11;14).  Aneuploid for all probes.  No evidence of t(4;14) or t(14;16)  3. Abnormal SNP microarray result, see separate report for details    Karyotype: 46,XY[20]  MM FISH: CCND1/IGH translocation with aneuploidy - t(11;14)    ASSESSMENT AND PLAN:  71 yo M with IgG kappa multiple myeloma.  He has been on therapy with RVD, but due to multiple hospitalizations, has been unable to tolerate full dose therapy.  We started on pomalidomide 4 mg po days 1-21 along with weekly dexamethasone 20 mg --> M-spike now 0 after 1 cycle.     #IgG kappa MM:   From his exam and presentation, appears he may have had cord compression at diagnosis. His BLE are weak. However, he notes this is how it has been since diagnosis.  At this point, already radiated and had therapy, so no further spine imaging for now unless new symptoms.   Given his debilitation I am concerned he will not tolerate triplet therapy.     He did not tolerate even a small increase in his Rev dosing, and yet his M-spike is rising. I think we need to change therapy.  I would rather a faster response, so will try pom/dex rather than ix/dex.  Did also  discuss infusion options such as carfilzomib - but as he already has dyspnea I think this will be problematic.  Daratumumab is also an option, however given travel and distance - will attempt oral therapy first if possible     We did one cycle of full dose pom/dex, which did not seem to worsen symptoms - and did clear his M-spike with 1 cycle. However, he continues to be quite debilitated and almost bed bound.  Stopped pom/dex a few weeks ago, will continue to hold given significantly frailty and failure to thrive.  Will consider doing another cycle if has increase in M-spike consistently   Continue Zometa q3 mos - reminded him to schedule as he has not had a dose here yet    #B12 deficiency:   Per outside  records previously deficient, tx with B12 injections, now appears resolved    #Dysphagia to solids:   Referral to speech, especially in setting of hospitalization for aspiration PNA 10/17 - has already seen at home and apparently no concern for aspiration    #Dyspnea:   Appreciate pulmonary consult - asked him to f/u with Dr. Miquel Dunn now that PFTs completed    #Malnutrition:   ?due to dysphagia/failure to thrive   Schedule appt with nutrition at [facility]    #Debilitation:   Suspect some of his weakness is sequelae of cord compression   HH for PT/OT      #Constipation:   Scheduled colace bid   Prn senna, prn MOM    Plan:  F/u with Dr. Miquel Dunn  Schedule appt with nutrition  Reminded to schedule Zometa  RTC in 1 month with labs    HOLD pom/dex

## 2016-10-07 ENCOUNTER — Telehealth (INDEPENDENT_AMBULATORY_CARE_PROVIDER_SITE_OTHER): Payer: Self-pay | Admitting: Pulmonary Disease

## 2016-10-07 NOTE — Telephone Encounter (Addendum)
-----   Message from Cheri Fowler, MD sent at 10/06/2016  4:41 PM PDT -----    I am concerned of a neuromuscular condition, which may be limiting his respiratory function.  I have ordered repeat PFTs with neuromuscular battery.  I will ask our nurse to reach out to patient again to set an appt with PFT lab to complete neuromuscular battery.

## 2016-10-07 NOTE — Telephone Encounter (Signed)
Per previous encounter, RN spoke with pt's son regarding repeat PFT.    RN placed follow up call to pt's son. No answer. Left detailed message regarding repeat PFT and sch phone number.   Clinic CB# left for further questions.       Complete PFT with full Bronch Eval [PFT2] (Order [id number])   [facility]:   [place]: [phone]

## 2016-10-10 NOTE — Telephone Encounter (Signed)
RN spoke with pt's son [name] regarding repeat PFTs with neuromuscular battery. Sch phone number given. Verbally understands.

## 2016-10-19 ENCOUNTER — Encounter (HOSPITAL_BASED_OUTPATIENT_CLINIC_OR_DEPARTMENT_OTHER): Payer: Self-pay

## 2016-10-21 ENCOUNTER — Ambulatory Visit
Admission: RE | Admit: 2016-10-21 | Discharge: 2016-10-24 | Disposition: A | Discharge status: Home / Self Care | Payer: Medicare Other | Attending: Hematology & Oncology | Admitting: Hematology & Oncology

## 2016-10-21 ENCOUNTER — Ambulatory Visit (HOSPITAL_BASED_OUTPATIENT_CLINIC_OR_DEPARTMENT_OTHER): Admit: 2016-10-21 | Discharge: 2016-10-24 | Payer: Medicare Other

## 2016-10-21 DIAGNOSIS — C9000 Multiple myeloma not having achieved remission: Principal | ICD-10-CM | POA: Insufficient documentation

## 2016-10-21 DIAGNOSIS — R634 Abnormal weight loss: Secondary | ICD-10-CM | POA: Insufficient documentation

## 2016-10-21 LAB — COMPREHENSIVE METABOLIC PANEL, BLOOD
ALT (SGPT): 16 U/L (ref 0–41)
AST (SGOT): 21 U/L (ref 0–40)
Albumin: 2.8 g/dL — ABNORMAL LOW (ref 3.5–5.2)
Alkaline Phos: 94 U/L (ref 40–129)
Anion Gap: 13 mmol/L (ref 7–15)
BUN: 14 mg/dL (ref 8–23)
Bicarbonate: 25 mmol/L (ref 22–29)
Bilirubin, Tot: 0.47 mg/dL (ref <1.2)
Calcium: 8.4 mg/dL — ABNORMAL LOW (ref 8.5–10.6)
Chloride: 100 mmol/L (ref 98–107)
Creatinine: 0.6 mg/dL — ABNORMAL LOW (ref 0.67–1.17)
GFR: >60 mL/min
Glucose: 122 mg/dL — ABNORMAL HIGH (ref 70–99)
Potassium: 3.7 mmol/L (ref 3.5–5.1)
Sodium: 138 mmol/L (ref 136–145)
Total Protein: 6.5 g/dL (ref 6.0–8.0)

## 2016-10-21 LAB — CBC WITH DIFF, BLOOD
ANC-Automated: 5.7 10*3/uL (ref 1.6–7.0)
ANC-Instrument: 5.7 10*3/uL (ref 1.6–7.0)
Abs Lymphs: 1.1 10*3/uL (ref 0.8–3.1)
Abs Monos: 0.9 10*3/uL — ABNORMAL HIGH (ref 0.2–0.8)
Hct: 31.7 % — ABNORMAL LOW (ref 40.0–50.0)
Hgb: 10.3 gm/dL — ABNORMAL LOW (ref 13.7–17.5)
Imm Gran Abs: 0.1 10*3/uL (ref <0.1)
Lymphocytes: 14 %
MCH: 27.2 pg (ref 26.0–32.0)
MCHC: 32.5 g/dL (ref 32.0–36.0)
MCV: 83.6 um3 (ref 79.0–95.0)
MPV: 10.2 fL (ref 9.4–12.4)
Monocytes: 12 %
Plt Count: 223 10*3/uL (ref 140–370)
RBC: 3.79 10*6/uL — ABNORMAL LOW (ref 4.60–6.10)
RDW: 16.3 % — ABNORMAL HIGH (ref 12.0–14.0)
Segs: 73 %
WBC: 7.8 10*3/uL (ref 4.0–10.0)

## 2016-10-21 LAB — LDH, BLOOD: LDH: 255 U/L — ABNORMAL HIGH (ref 25–175)

## 2016-10-21 LAB — KAPPA LAMBDA FREE LIGHT CHAIN W/ RATIO, BLOOD
Kappa Free Light Chains Quant: 45.4 mg/L — ABNORMAL HIGH (ref 3.3–19.4)
Kappa:Lambda Free Light Chain Ratio: 2.7 — ABNORMAL HIGH (ref 0.26–1.65)
Lambda Free Light Chains Quant: 16.8 mg/L (ref 5.7–26.3)

## 2016-10-21 LAB — IMMUNOGLOBULIN PANEL (IGA,IGG,IGM), BLOOD
IGA: 80 mg/dL (ref 70–400)
IGG: 754 mg/dL (ref 700–1600)
IGM: 20 mg/dL — ABNORMAL LOW (ref 40–230)

## 2016-10-21 LAB — TSH, BLOOD: TSH: 0.66 u[IU]/mL (ref 0.27–4.20)

## 2016-10-21 MED ORDER — ZOLEDRONIC ACID 4 MG/5ML IV CONC
4.0000 mg | Freq: Once | INTRAVENOUS | Status: DC
Start: 2016-10-21 — End: 2016-10-21
  Filled 2016-10-21: qty 5

## 2016-10-21 MED ORDER — SODIUM CHLORIDE 0.9 % IV SOLN
4.00 mg | Freq: Once | INTRAVENOUS | Status: AC
Start: 2016-10-21 — End: 2016-10-21
  Administered 2016-10-21: 4 mg via INTRAVENOUS
  Filled 2016-10-21: qty 5

## 2016-10-21 MED ORDER — ZOLEDRONIC ACID 4 MG/5ML IV CONC
4.0000 mg | Freq: Once | INTRAVENOUS | Status: DC
Start: 2016-10-21 — End: 2016-10-21

## 2016-10-21 MED ORDER — SODIUM CHLORIDE 0.9 % IV BOLUS
1000.0000 mL | INJECTION | Freq: Once | INTRAVENOUS | Status: DC
Start: 2016-10-21 — End: 2016-10-21

## 2016-10-21 MED ORDER — SODIUM CHLORIDE 0.9 % IV BOLUS
500.00 mL | INJECTION | Freq: Once | INTRAVENOUS | Status: AC
Start: 2016-10-21 — End: 2016-10-21
  Administered 2016-10-21: 500 mL via INTRAVENOUS

## 2016-10-21 NOTE — Interdisciplinary (Signed)
Infusion Nursing Note - C[place] is a 71 year old male who presents for hydration and Zometa infusion.    Pre-treatment nursing assessment:  Unexpected findings: Hypotension. To hydrate prior to obtaining orthostatic B/P (supine and sitting only per [employer], [employer]).    Arrives with PIV placed earlier today for labs/treatment. Good blood return noted upon aspiration followed by flush with 10 ml normal saline to clear. Op-site in place. Site without redness, swelling, discomfort or drainage.     Objective Data:  Vitals:    10/21/16 1150 10/21/16 1155 10/21/16 1332 10/21/16 1334   BP: 124/82 96/69 (!) 130/91 133/87   BP Location: Left arm Left arm Left arm Left arm   BP Patient Position: Supine Sitting Supine Sitting   Pulse: 114 130 111 110   Resp:       Temp:       TempSrc:         Pain Score: 0  There is no height or weight on file to calculate BSA.  There is no height or weight on file to calculate BMI.    Patient met treatment parameters.    Hydration, Orthostatic B/P check (supine and sitting only) and Zometa infusion:  Patient tolerated treatment well. PIV with 24 ga.catheter inserted to dorsum, distal right forearm without problem. PIV removed with catheter intact. Site without redness, swelling, discomfort or drainage. Sterile 2 x 2 gauze pressure dressing applied and secured with Coban.      Patient Education  Learner: Patient  Readiness to learn: Acceptance  Method: Explanation    Wellbeing Assessment  Not assessed    Treatment Education: Information/teaching given to patient including: signs and symptoms of infection, bleeding, adverse reaction(s), symptom control, and when to notify MD.  [name] Prevention Education: Instructed patient to call for assistance.  Pain Education: Patient instructed to contact nurse if pain should develop or if their current pain therapy becomes ineffective.  Response: Verbalizes understanding    Discharge Plan  Discharge instructions  given to patient.  Future appointments:date given and reviewed with treatment plan.  Discharge Mode: Wheelchair  Discharge Time: Jaconita by: Family  Discharged To: Home

## 2016-10-21 NOTE — Interdisciplinary (Signed)
Pt's here for pre zometa lab. Placed piv and drew blood as order. Pt was wheeled in by son, asking for bed. Bought to room 4 side 1.  Heart rate 116-120. Pt denied palpitation/pain/dizziness/chest discomfort. On tube feeding.  notified PA Pelio of tachycardia. RECEIVED ORDER FROM PA Pelio to do pre/post  orthostatic blood pressure and iv hydration 500 ml. Able to do BP on supine/sitting, not standing due to unsteadiness. Heart rate 130 with sitting position. PA was informed of orthostatic BP/heart rate .

## 2016-10-24 LAB — IMMUNOFIXATION, BLOOD

## 2016-10-25 LAB — IMMUNOFIX INTERPRETATION, SERUM

## 2016-10-25 LAB — EPG, INTERPRETATION, SERUM

## 2016-10-27 ENCOUNTER — Encounter (HOSPITAL_BASED_OUTPATIENT_CLINIC_OR_DEPARTMENT_OTHER): Payer: Self-pay

## 2016-10-27 LAB — EPG, SERUM
A/G Ratio, EPG: 0.83
Albumin, EPG: 2.68 gm/dL — ABNORMAL LOW (ref 3.20–5.00)
Alpha 1, EPG: 0.4 gm/dL (ref 0.10–0.40)
Alpha 2, EPG: 0.97 gm/dL (ref 0.60–1.10)
Beta, EPG: 0.87 gm/dL (ref 0.60–1.30)
Gamma, EPG: 0.98 gm/dL (ref 0.70–1.50)
M-Spike 1 (Gamma): 0.15 gm/dL — ABNORMAL HIGH (ref 0.00–0.01)
Total Protein, EPG: 5.9 gm/dL — ABNORMAL LOW (ref 6.00–8.00)

## 2016-10-31 ENCOUNTER — Encounter (HOSPITAL_BASED_OUTPATIENT_CLINIC_OR_DEPARTMENT_OTHER): Payer: Self-pay

## 2016-11-07 ENCOUNTER — Encounter (HOSPITAL_BASED_OUTPATIENT_CLINIC_OR_DEPARTMENT_OTHER): Payer: Self-pay

## 2016-11-08 ENCOUNTER — Ambulatory Visit: Payer: Medicare Other | Attending: Hematology & Oncology | Admitting: Registered"

## 2016-11-08 ENCOUNTER — Ambulatory Visit (HOSPITAL_BASED_OUTPATIENT_CLINIC_OR_DEPARTMENT_OTHER): Payer: Medicare Other | Admitting: Hematology & Oncology

## 2016-11-08 ENCOUNTER — Encounter (HOSPITAL_BASED_OUTPATIENT_CLINIC_OR_DEPARTMENT_OTHER): Payer: Self-pay

## 2016-11-08 ENCOUNTER — Other Ambulatory Visit (HOSPITAL_BASED_OUTPATIENT_CLINIC_OR_DEPARTMENT_OTHER): Payer: Medicare Other

## 2016-11-08 VITALS — BP 124/82 | HR 115 | Temp 97.9°F | Resp 16 | Ht 66.0 in | Wt 88.0 lb

## 2016-11-08 DIAGNOSIS — E46 Unspecified protein-calorie malnutrition: Principal | ICD-10-CM | POA: Insufficient documentation

## 2016-11-08 DIAGNOSIS — R5383 Other fatigue: Secondary | ICD-10-CM | POA: Insufficient documentation

## 2016-11-08 DIAGNOSIS — C9000 Multiple myeloma not having achieved remission: Secondary | ICD-10-CM | POA: Insufficient documentation

## 2016-11-08 DIAGNOSIS — D649 Anemia, unspecified: Secondary | ICD-10-CM | POA: Insufficient documentation

## 2016-11-08 LAB — CBC WITH DIFF, BLOOD
ANC-Automated: 5.2 10*3/uL (ref 1.6–7.0)
ANC-Instrument: 5.2 10*3/uL (ref 1.6–7.0)
Abs Lymphs: 1.4 10*3/uL (ref 0.8–3.1)
Abs Monos: 0.4 10*3/uL (ref 0.2–0.8)
Hct: 34.7 % — ABNORMAL LOW (ref 40.0–50.0)
Hgb: 10.9 gm/dL — ABNORMAL LOW (ref 13.7–17.5)
Lymphocytes: 20 %
MCH: 26.8 pg (ref 26.0–32.0)
MCHC: 31.4 g/dL — ABNORMAL LOW (ref 32.0–36.0)
MCV: 85.3 um3 (ref 79.0–95.0)
MPV: 9.7 fL (ref 9.4–12.4)
Monocytes: 6 %
Plt Count: 249 10*3/uL (ref 140–370)
RBC: 4.07 10*6/uL — ABNORMAL LOW (ref 4.60–6.10)
RDW: 17.5 % — ABNORMAL HIGH (ref 12.0–14.0)
Segs: 73 %
WBC: 7.1 10*3/uL (ref 4.0–10.0)

## 2016-11-08 LAB — COMPREHENSIVE METABOLIC PANEL, BLOOD
ALT (SGPT): 9 U/L (ref 0–41)
AST (SGOT): 18 U/L (ref 0–40)
Albumin: 3.3 g/dL — ABNORMAL LOW (ref 3.5–5.2)
Alkaline Phos: 83 U/L (ref 40–129)
Anion Gap: 7 mmol/L (ref 7–15)
BUN: 13 mg/dL (ref 8–23)
Bicarbonate: 29 mmol/L (ref 22–29)
Bilirubin, Tot: 0.32 mg/dL (ref <1.2)
Calcium: 8.8 mg/dL (ref 8.5–10.6)
Chloride: 103 mmol/L (ref 98–107)
Creatinine: 0.69 mg/dL (ref 0.67–1.17)
GFR: >60 mL/min
Glucose: 114 mg/dL — ABNORMAL HIGH (ref 70–99)
Potassium: 3.8 mmol/L (ref 3.5–5.1)
Sodium: 139 mmol/L (ref 136–145)
Total Protein: 6.6 g/dL (ref 6.0–8.0)

## 2016-11-08 LAB — LDH, BLOOD: LDH: 223 U/L — ABNORMAL HIGH (ref 25–175)

## 2016-11-08 NOTE — Patient Instructions (Signed)
PLAN:  1. Continue to HOLD Pomalyst.  2. Return to clinic on 4-6 weeks with labs.   3. PLEASE SCHEDULE GI CONSULT AND PFTS.  Gastroenterology Department at [phone]  [phone]    CASE MANAGERS:  Larey Days, RN   Kandis Nab, LVN    Phone # [phone]  Fax # [phone]  Pager # [phone]    After office hours MD on call line: [phone]. Ask to speak with BMT on call doctor     **Call to schedule, cancel, or reschedule clinic appointments: [phone]    [place]: [phone]  [place]: [phone]  Social Worker Lamonte Sakai): [phone]

## 2016-11-08 NOTE — Patient Instructions (Addendum)
--  Follow meal pattern of 3 meals and at least 2 snacks daily  -Choose high calorie/high protein meals and snacks   -Recommend vegan protein supplement drink (suggested brands: Orgain or Vega drinks) or use vegan protein powder and add to homemade smoothies, yogurt, cereal or food of your choice.  Goal 2 protein drinks or 2 scoops of protein powder per day.   --Recommend adding ghee, avocado, nut butter, whole fat dairy, butter, oils to foods to add calories  --Recommend increasing TF regimen via GT. Recommend at least 2 cartons daily if agreeable.  At minimum, continue at least 1 carton Jevity 1.2 formula bolus infusion via GT daily.    --Increase free water flushes to 1000 mL/day until oral liquid consumption improves  -Trial yacima pasta, prune juice, beet juice for constipation  -Keep food diary for next appointment    [place] Paste: 1-2 tablespoons (15-30 ml) per day     Ingredients   1 pound of prunes   1 pound pitted raisins   1 pound figs   4 oz of senna tea   1 cup brown sugar   1 cup lemon juice     Instructions   Steep tea for 5 minutes, strain tea leaves   Add 1 pint tea to large pot with fruit   Boil tea and fruit for 5 minutes   Remove from heat and add sugar and lemon juice, allow to cool   Use hand mixer / food processor to paste texture   Place in plastic container in freezer (does not freeze)   Spread on toast or add hot water to make a drink

## 2016-11-08 NOTE — Progress Notes (Signed)
BONE MARROW TRANSPLANT      Diagnosis: Multiple myeloma    Primary Care Physician: Bobette Mo, MD    Myeloma Data at Diagnosis:  Date of diagnosis:   Age: 71  Hgb:   Creat:   Alb:   Calcium:   B2M: 2.5  Bone marrow plasma cell %:   Cytogenetics/FISH:  Skeletal survey:    Free light chains: kappa 1005.1/lambda 7.1; ratio 141.56   SPEP/ M-protein: 3.1  IFE: IgG kappa  R-ISS:  ISS: unknown  Dur[place]:    Interval History:  Since his last appt - he remains off therapy for myeloma per my instructions, given FTT.  He actually feels (and appears) notably better than last visit.  He has no more nausea. He is actually eating much better (still small amounts) and able to now walk up the stairs.  Even his SOB is much better.    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  He had an abnl chest X-ray which lead to a chest CT which noted multiple lytic lesions in thoracic vertebrae, as well as compression fractures.  He was referred to Dr. Charlaine Dalton for further evaluation.  He had a bone marrow biopsy that showed MM.  He had significant pain from his spine lesions and had palliative radiation from 03/24/16-04/11/16 from T9-T11 and exophytic masses.  His pain resolved once he finished radiation therapy.      He was then started on Revlimid 20 mg po daily 2 weeks on 1 week off as well as Velcade SQ and dexamethasone    He had several hospitalizations for infection, difficulty with weight gain, eating.  He was placed on TPN and then transitioned to tube feeds by 11/17.      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  At this point, he's off of dexamethasone.  He is taking Revlimid 5 mg po daily 1 week on 1 week off.    Given his significant debilitation and illness, he moved to [facility] to have  more support/caregiving.  He and his wife have moved in with their son, Alan [name] in [place]. They have another son Alan [name]) and daughter in [place].    Hospitalizations and significant events:   9/26-10/4: Admitted with N/V and failure to thrive, felt to have gastritis  10/16-10/25: Admitted with aspiration pneumonia; tx with vanc/zosyn/azithro  05/18/16: PEG tube placed  06/18/16: ER visit for abdominal pain at PEG site    07/2016: Attempted lenalidomide 10 mg (was on 5 mg every other week prior to moving).  Did not tolerate so went to 10 mg every other day until complete  08/2016: Changed to pom/dex with pomalidomide 4 mg po daily days 1-21 and dex 20 mg po weekly.  Tolerance was about the same as lenalidomide.  But M-spike responded.  09/2016: Took him off of pom/dex after only 1 cycle due to significant frailty and no improvement with improvement in myeloma.  Not clear pom/dex is actually what is causing his debilitation/frailty/failure to thrive, but feel like he is too frail to consider continued therapy when myeloma has responded.      Review of Systems:  General: No fever, chills.  +fatigued very easily  Eyes: No icterus, vision  change, double vision, eye pain, blurry vision, or floaters.   Oral/throat: No oral pain, oral ulcers or lesions, tooth pain, sore throat. +difficulty swallowing pills especially  Nose: No nasal discharge, sinus pain.   Ears: No change in hearing or ear pain.   Neck: No pain, no masses.  Cardiac: No chest pain or pressure, palpitations, or edema.   Pulmonary: No chest pain, +dyspnea on exertion  Abdomen: No pain, bloating, +nausea several times/week, vomiting, diarrhea, +occasional constipation - takes MOM, melena, or blood per rectum.   Genitourinary: No dysuria, change in urinary frequency.   Extremities: No swelling, pain +numbness in feet, also fingertips.   Skin: No rash, lesions, pruritus.   Neurologic:  No headache, numbness, +general weakness, balance problems.   Psych:  Normal mood     Past Medical/Surgical Hx:   1. Multiple myeloma  2. Retinal detachment 2006    Social History:   He is married. He and his wife moved from [place] to the [place] in 2002.  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  Since 2010 he's been very involved with an ashram in [place], [place].  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    He denies EtOH/never smoker/no smokeless tobacco.  He is vegetarian  He has 3 children, 2 sons and 1 daughter    Family History:   He has no known family h/o cancer.  Father died when patient was only 3 mos but he is not aware of cause    Medications:    Current Outpatient Prescriptions:     acyclovir (ZOVIRAX) 400 MG tablet, Take 1 tablet (400 mg) by mouth 2 times daily., Disp: 60 tablet, Rfl: 3    aspirin 81 MG chewable tablet, Take 1 tablet (81 mg) by mouth daily., Disp: 30 tablet, Rfl: 3    docusate sodium (COLACE) 250 MG capsule, Take 1 capsule (250 mg) by mouth 2 times daily., Disp: 60 capsule, Rfl: 2    HYDROcodone-acetaminophen (NORCO) 5-325 MG tablet, Take 1 tablet by mouth every 6 hours as needed for Moderate Pain (Pain Score 4-6)., Disp: , Rfl:     ondansetron (ZOFRAN ODT) 4 MG disintegrating tablet, Take 1 tablet (4 mg) by mouth every 8 hours as needed for Nausea., Disp: 30 tablet, Rfl: 3    pantoprazole (PROTONIX) 40 MG packet, Take 40 mg by mouth daily., Disp: 1 packet, Rfl: 3    senna (SENOKOT) 8.6 MG tablet, Take 1 tablet (8.6 mg) by mouth 2 times daily as needed for Constipation., Disp: 120 tablet, Rfl: 3    Allergies:  Review of patient's allergies indicates no known allergies.    Objective:   11/08/16  1107   BP: 124/82   Pulse: 115   Resp: 16   Temp: 97.9 F (36.6 C)   SpO2: 100%       Physical Exam:   Karnofsky performance status: 50  General: Thin, chronically ill appearing male, no distress - in a wheelchair  HEENT: EOMI, PERRL, sclera anicteric, conjunctiva pink and moist, oral cavity without lesions or  ulcers, tonsils normal   Neck: Supple, no lymphadenopathy   Lungs: Clear to auscultation bilaterally, no wheezes, rubs, or rales.    Cardiac: No JVD, tachycardic, normal rhythm, normal S1, S2, no murmurs or gallops   Abdomen: Not distended, normal bowel sounds, soft, non-tender, no organomegaly, exam limited in wheelchair as weak to transfer  Extremities: Warm, well perfused, no cyanosis, no  clubbing, no edema   Skin: No jaundice, no petechiae, no purpura   Lymph: No lymphadenopathy at cervical, supraclavicular, axillary, or inguinal lymph nodes   Neurologic: Awake, alert, oriented.  +BLE weakness R>L    Lab results:  Lab Results   Component Value Date    WBC 7.1 11/08/2016    RBC 4.07 (L) 11/08/2016    HGB 10.9 (L) 11/08/2016    HCT 34.7 (L) 11/08/2016    MCV 85.3 11/08/2016    MCHC 31.4 (L) 11/08/2016    RDW 17.5 (H) 11/08/2016    PLT 249 11/08/2016    MPV 9.7 11/08/2016    SEG 73 11/08/2016    LYMPHS 20 11/08/2016    MONOS 6 11/08/2016    EOS 1 09/19/2016    BASOS 2 09/19/2016     Lab Results   Component Value Date    NA 138 10/21/2016    K 3.7 10/21/2016    CL 100 10/21/2016    BICARB 25 10/21/2016    BUN 14 10/21/2016    CREAT 0.60 (L) 10/21/2016    GLU 122 (H) 10/21/2016    [place] 8.4 (L) 10/21/2016     Lab Results   Component Value Date    AST 21 10/21/2016    ALT 16 10/21/2016    LDH 255 (H) 10/21/2016    ALK 94 10/21/2016    TP 6.5 10/21/2016    ALB 2.8 (L) 10/21/2016    TBILI 0.47 10/21/2016     OSH Labs:   03/16/2016 04/27/2016 06/07/2016 06/18/2016   kappa 1005.1 44.4 136.9 142.7   lambda 7.1 5.4 23.4 24.4   k/l 141.56 8.22 5.85 5.85   IgG 3817 1132 1390    M-spike 3.1 0.8 0.7      Results for Alan [name], Alan [name] (MRN [id number]) as of 08/10/2016 15:03   Ref. Range 07/26/2016 11:55 08/24/2016 09/19/2016 10/21/2016   M-Spike 1 (Gamma) Latest Ref Range: 0.00 - 0.01 gm/dL 1.15 (H)   0.15   IGA Latest Ref Range: 70 - 400 mg/dL 63 (L) 68 97 80   IGG Latest Ref Range: 700 - 1600 mg/dL 1773 (H) 1242 826 754   IGM Latest Ref  Range: 40 - 230 mg/dL 12 (L) 26 18 20    Kappa Free Light Chains Quant Latest Ref Range: 3.3 - 19.4 mg/L 120.3 (H) 72.5 30.1 45.4   Lambda Free Light Chains Quant Latest Ref Range: 5.7 - 26.3 mg/L 13.4 12.5 11.2 16.8   Kappa:Lambda Free Light Chain Ratio Latest Ref Range: 0.26 - 1.65  8.98 (H) 5.8 2.69 2.70       OSH results:  CT chest w/o contrast 03/15/16  IMPRESSION:   1. Multiple lytic lesions throughout the thoracic spine and ribs asdescribed suggesting metastatic disease.   2. The lesion evident on the chest x-ray corresponds with a 5.7 x 3.3 x 4.5 cm mass infiltrating the left T9 rib with extension to theleft T8-9 foramen but no invasion of the foramen.   3. 4.1 x 3.1 cm exophytic mass lesion at T10 with pathologicfracture. The tumor extends into the spinal canal with moderatecentral canal stenosis and right greater than left foraminalnarrowing at T9-10 and T10-11.   4. Prominent infiltrative tumor posteriorly at T11 withoutcompression fracture.   5. Additional pathologic fracture within the more distal left ninthrib.   6. Marked osteopenia with probable hemangioma is an high suspicion for multiple additional lesions throughout the thoracic spine. Recommend MRI of the thoracic spine without and with contrast  for further evaluation   7. Multiple hypodense lesions within the liver also raise concern for metastatic disease. Dedicated CT of the abdomen and pelvis withcontrast is recommended for further evaluation of metastatic diseaseand possible primary source.   8. Emphysema and biapical scarring in the lungs without evidence forprimary tumor.    Bone Survey 03/16/16  1. There is probable pathologic fracture of T10 vertebral body suspicious for bony lesion. Expansile lesion of left posterior eighth rib suspicious for metastatic disease.     PET 03/22/16  1. Widespread lytic lesions throughout the visualized axial and appendicular skeleton, compatible with the reported clinical history  of multiple  myeloma. This is most notable for the lytic lesion with pathologic compression fracture in the T10 vertebral body. No  definite extra-skeletal involvement noted on today's examination, which was limited by lack of acquisition of diagnostic PET images  secondary to patient pain during the examination.  2. Aortic atherosclerosis.  3. Additional incidental findings, as above.      Pathology:  OSH BMBx 03/25/16  1. Plasma cell neoplasm with mildy atypical plasma cells (>90% of the sampled marrow in the clot sections)  2. Abnormal FISH result: CCND1/IGH translocation with aneuploidy - t(11;14).  Aneuploid for all probes.  No evidence of t(4;14) or t(14;16)  3. Abnormal SNP microarray result, see separate report for details    Karyotype: 46,XY[20]  MM FISH: CCND1/IGH translocation with aneuploidy - t(11;14)    ASSESSMENT AND PLAN:  71 yo M with IgG kappa multiple myeloma.  He has been on therapy with RVD, but due to multiple hospitalizations, has been unable to tolerate full dose therapy.  We started on pomalidomide 4 mg po days 1-21 along with weekly dexamethasone 20 mg but stopped therapy after a month (M-spike dropped nicely but did have some sx, not clear if attributable to pom/dex as continues with them: nausea, decreased appetite)    #IgG kappa MM:   From his exam and presentation, appears he may have had cord compression at diagnosis. His BLE are weak. However, he notes this is how it has been since diagnosis.  At this point, already radiated and had therapy, so no further spine imaging for now unless new symptoms.   Given his debilitation I am concerned he will not tolerate triplet therapy.     He did not tolerate even a small increase in his Rev dosing, and yet his M-spike is rising. I think we need to change therapy.  I would rather a faster response, so will try pom/dex rather than ix/dex.  Did also discuss infusion options such as carfilzomib - but as he already has dyspnea I think this will be problematic.   Daratumumab is also an option, however given travel and distance - will attempt oral therapy first if possible     We did one cycle of full dose pom/dex, which did not seem to worsen symptoms - and did clear his M-spike with 1 cycle. However, he continues to be quite debilitated and almost bed bound.  Stopped pom/dex ~2 mos ago, will continue to hold given significantly frailty and failure to thrive.  Will consider doing another cycle if has increase in M-spike consistently   Continue Zometa q3 mos - reminded him to schedule as he has not had a dose here yet    #B12 deficiency:   Per outside records previously deficient, tx with B12 injections, now appears resolved   Monitor B12, likely continue oral supplements    #Dysphagia to solids:  Referral to speech, especially in setting of hospitalization for aspiration PNA 10/17 - has already seen at home and apparently no concern for aspiration    #Dyspnea:   Appreciate pulmonary consult - needs to schedule PFTs with neuromuscular battery    #Malnutrition:   ?due to dysphagia/failure to thrive   Nutrition appt today    #Debilitation:   Suspect some of his weakness is sequelae of cord compression   HH for PT/OT      #Constipation:   Scheduled colace bid   Prn senna, prn MOM    Plan:  F/u with Dr. Miquel Dunn and schedule PFTs (neuromuscular)  Continue q 3 month Zometa, though HOLD pom/dex still  RTC in 4-6 weeks with labs

## 2016-11-08 NOTE — Interdisciplinary (Signed)
Nutrition Outpatient Assessment    Location of [facility]  Referring Provider: Clancy Gourd, MD  Referral Reason: Tube Feed  Time Spent: 60 minutes    Date of Service: 11/08/16      Assessment: 71 yo M with IgG kappa multiple myeloma.  He has been on therapy with RVD, but due to multiple hospitalizations, has been unable to tolerate full dose therapy. Remains off therapy for myeloma per care team for FTT. Pt w/ hx of dysphagia s/p PEG placement in November 2017.    Pt accompanied by wife and son present via phone call for visit.  Pt and family report that pt no longer has any swallowing difficulties and is tolerating all textures of solid food without complaint. Pt declines recommendation for referral to SLP and states he has had an SLP assess him at his house recently. Of note, pt eating crackers and crusty bread during appointment without any visible/apparent distress. Pt very strongly expresses his desire to wean himself off of his tube feedings and has decreased his TF regimen to 1 carton of Jevity 1.2/day + 600 mL free water flushes/day. Recommended dose is 4 cartons of Jevity 1.2 per day. Pt continues to utilize his feeding tube for most of his hydration needs as he admits that he is taking minimal fluids by mouth.     Pt reports he is eating small meals throughout the day, however his reported intake is very low in protein given his preference to follow a mostly vegan diet (avoids meat/poultry/fish/eggs, will drink milk 1-2 times per week).  He is currently not drinking any oral nutrition supplements.  Weight continues to trend down.  Pt now -10# x 4 months (10% wt loss x 4 months)  - however he insists his energy level has improved since he started eating more solid food.      Nutrition education provided on optimization of oral diet.  Importance of adequate protein intake discussed.  Calorie and protein goals reviewed.  Incorporation of oral nutrition supplements encouraged.  Pt also  encouraged to continue to use his feeding tube for nutrition optimization.     PMH: No past medical history on file.  Past Surgical Hx: No past surgical history on file.     Ht: 5'6"   Wt.: 88#, 39.9 kg %IBW: 62% of 142# IBW        BMI: 14.2   UBW/Wt hx:  Pt and family overall unsure of UBW but do endorse hx of significant unintentional wt loss.  Per documented wt hx, pt down 10# x 4 months.     Nutrition Intake:  Dietary recall:  B: wheat bread w/ butter, 2 slices  S: mango  L: wheat bread w/ butter 1 slice, rice cooked w/ ghee and cooked vegetables (~1/4 cup)   S: 1 carton Jevity 1.2 via feeding tube (300 kcal)  D: tortilla w/ cooked vegetables (1/2 cup), mango and papaya    Hydration: small amounts of water.  Flushing 600 mL additional water via GT.     GI Function: Pt reports ongoing constipation.  He is taking colace/senna once daily.  He states that his MD told him to increase his dose to BID, however he doesn't want to take the extra medication.  Nutrition management of constipation reviewed. Denies n/v.     Labs: reviewed    Medications/Supplements: reviewed     Estimated calorie/protein/fluid needs:  1197-1397 kcal/day (30-35 kcal/kg). 60-80 g/day protein (1.5-2 g/kg). Maintenance Fluids: 5102-5852 mL/day (1 mL/kcal)  Nutrition Dx: Severe malnutrition r/t chronic illness, medical condition/course AEB inadequate oral/nutrition intake per reported dietary recall, 10% unintentional x 4 months, visual presentation and pt report of fat/muscle wasting       Goals:   -Pt to meet >75% of estimated needs with acceptable tolerance by next f/u.  -Wt gain, prevent additional wt loss      INTERVENTIONS  ---Follow meal pattern of 3 meals and at least 2 snacks daily  -Choose high calorie/high protein meals and snacks   -Recommend vegan protein supplement drink (suggested brands: Orgain or Vega drinks) or use vegan protein powder and add to homemade smoothies, yogurt, cereal or food of your choice.  Goal 2 protein  drinks or 2 scoops of protein powder per day.   --Recommend adding ghee, avocado, nut butter, whole fat dairy, butter, oils to foods to add calories  --Recommend increasing TF regimen via GT. Recommend at least 2 cartons Jevity 1.2 daily (Bolus infusion via GT) if agreeable.  At minimum, continue at least 1 carton Jevity 1.2 formula bolus infusion via GT daily.    --Increase free water flushes to 1000 mL/day until oral liquid consumption improves  -Trial yacima pasta, prune juice, beet juice for constipation  -Keep food diary for next appointment        Recommendations for care team/providers:  1. Recommend SLP referral - noted pt declines at this time     2. Pt encouraged to increase his TF regimen for nutrition optimization.  Would recommend at least 2 cartons of Jevity 1.2 bolus infusion via GT daily.    >If pt not taking any PO intake, resume goal TF rx: Jevity 1.2 or comparable 1.2 kcal/mL formula @ 4.5 cartons/day bolus infusion via GT  -provides 1283 kcal, 60 g protein, 1067 mL total volume, 860 mL free water   -Free water flushes of 60 mL 4x/day before AND after each carton administration (Total 480 mL/day)    Monitoring and Evaluation:  RD to monitor/evaluate nutrition status, labs, wt trend    Readiness to change:  Eager  Expected Compliance:  Good     Barriers to Learning/Compliance:  None  Handouts Provided: AVS with instructions      Pt verbalized understanding of nutrition goals/recommendations. Answered all pt questions at this time.  Provided email for interim questions.     Follow up: 4 weeks     Halford Chessman, RD, CNSC

## 2016-11-16 ENCOUNTER — Encounter (HOSPITAL_BASED_OUTPATIENT_CLINIC_OR_DEPARTMENT_OTHER): Payer: Self-pay

## 2016-11-16 DIAGNOSIS — C9000 Multiple myeloma not having achieved remission: Principal | ICD-10-CM

## 2016-11-16 DIAGNOSIS — K9423 Gastrostomy malfunction: Secondary | ICD-10-CM

## 2016-11-17 ENCOUNTER — Encounter (HOSPITAL_BASED_OUTPATIENT_CLINIC_OR_DEPARTMENT_OTHER): Payer: Self-pay

## 2016-11-17 NOTE — Telephone Encounter (Addendum)
Called GI department to have pt's current consult moved up d/t need for PEG tube eval. Pt rescheduled with NP in [facility] 5/17.    Called pt's son, [name], and let him know that his dad's appt has been moved up. Appt info given to [facility]. Let him know that this is something that needs to be evaluated by GI, and is not something we can send home health to evaluate or evaluate ourselves. Instructed [name] to bring pt to their local ED if he feels this is something that needs immediate attention, or if the site shows any s/s of infection. Understanding verbalized. Pt schedule for f/u with Dr. 6/12. No further questions at time of call.

## 2016-11-23 ENCOUNTER — Other Ambulatory Visit (INDEPENDENT_AMBULATORY_CARE_PROVIDER_SITE_OTHER): Payer: Medicare Other | Admitting: Physician Assistant

## 2016-11-24 ENCOUNTER — Encounter (HOSPITAL_BASED_OUTPATIENT_CLINIC_OR_DEPARTMENT_OTHER): Payer: Self-pay | Admitting: Nurse Practitioner

## 2016-11-24 ENCOUNTER — Ambulatory Visit: Payer: Medicare Other | Attending: Hematology & Oncology | Admitting: Nurse Practitioner

## 2016-11-24 DIAGNOSIS — R634 Abnormal weight loss: Secondary | ICD-10-CM | POA: Insufficient documentation

## 2016-11-24 DIAGNOSIS — K9423 Gastrostomy malfunction: Principal | ICD-10-CM | POA: Insufficient documentation

## 2016-11-24 NOTE — Progress Notes (Signed)
Visit practitioner:  Sherrell Puller, NP    Date / Time: 11/24/2016 10:22 AM    Referring Provider: Su Monks     Reason for Visit: gtube malfunction     Type of Visit: New patient    History of Present Illness:   Alan [name] is a 71 year old male referred to GI 09/2016 for dysphagia, difficulty swallowing and weight loss.  Pt recently having mechanical difficulty of gtube male adapter disconnecting from gtube easily.    Pt seen in clinic with Son.    Pt states he no longer is using gtube, and wants to have gtube removed.  All nutrition has been oral for 2 weeks,   Pt denies difficulty swallowing and reports weight is stable      Patient Active Problem List    Diagnosis Date Noted    Multiple myeloma, remission status unspecified (CMS-HCC) 07/26/2016       History reviewed. No pertinent past medical history.    No past surgical history on file.    Current Medications:    acyclovir (ZOVIRAX) 400 MG tablet Take 1 tablet (400 mg) by mouth 2 times daily.   aspirin 81 MG chewable tablet Take 1 tablet (81 mg) by mouth daily.   docusate sodium (COLACE) 250 MG capsule Take 1 capsule (250 mg) by mouth 2 times daily.   HYDROcodone-acetaminophen (NORCO) 5-325 MG tablet Take 1 tablet by mouth every 6 hours as needed for Moderate Pain (Pain Score 4-6).   ondansetron (ZOFRAN ODT) 4 MG disintegrating tablet Take 1 tablet (4 mg) by mouth every 8 hours as needed for Nausea.   pantoprazole (PROTONIX) 40 MG packet Take 40 mg by mouth daily.   senna (SENOKOT) 8.6 MG tablet Take 1 tablet (8.6 mg) by mouth 2 times daily as needed for Constipation.       Allergies:  No Known Allergies    Family History:  No family history on file.    Social History     Social History    Marital status: Married     Spouse name: N/A    Number of children: N/A    Years of education: N/A     Social History Main Topics    Smoking status: Never Smoker    Smokeless tobacco: Never Used    Alcohol use None    Drug use: None    Sexual  activity: Not Asked     Social Activities of Daily Living Present    None     Social History Narrative       Review of Systems:  Weight loss:no  Trouble breathing: no  Chest pain: no  Abdominal pain: no  Nausea / vomitting: no  Kidney problems: no  Back pain: no  Seizures: no  Diabetes: no  Thyroid problems: no  Skin rash: no      Physical examination:   BP 137/84 (BP Location: Left arm, BP Patient Position: Sitting, BP cuff size: Regular)   Pulse 101   Temp 97.8 F (36.6 C) (Oral)   Resp 16   Ht '5\' 6"'$  (1.676 m)   Wt (!) 39.9 kg (88 lb)   SpO2 99%   BMI 14.2 kg/m2 Body mass index is 14.2 kg/(m^2).  Wt Readings from Last 2 Encounters:   11/24/16 (!) 39.9 kg (88 lb)   11/08/16 (!) 39.9 kg (88 lb)    Blood Pressure   11/24/16 137/84   11/08/16 124/82      Pain Score: 0  General:  Well developed,thin male in no apparent distress.  Affect:  Normal  HEENT:  No scleral icterus, Trachea midline, mucus membranes moist.  Lungs:  Normal respiratory effort.  Cardiovascular:  Regular rate and rhythm without murmurs, rubs or gallops.  Abdomen:  Abdomen soft, non tender, gtube in expected location   Extremities:  No peripheral edema. Warm well-perfused.  Skin:  Non-icteric and no visible rash  Neuro:  Cranial nerves II-XII, sensation,  Psych:  Alert oriented X3,     Lab Results  Lab Results   Component Value Date    WBC 7.1 11/08/2016    RBC 4.07 (L) 11/08/2016    HGB 10.9 (L) 11/08/2016    HCT 34.7 (L) 11/08/2016    MCV 85.3 11/08/2016    MCHC 31.4 (L) 11/08/2016    RDW 17.5 (H) 11/08/2016    PLT 249 11/08/2016    MPV 9.7 11/08/2016     Lab Results   Component Value Date    BUN 13 11/08/2016    CREAT 0.69 11/08/2016    CL 103 11/08/2016    NA 139 11/08/2016    K 3.8 11/08/2016    [place] 8.8 11/08/2016    TBILI 0.32 11/08/2016    ALB 3.3 (L) 11/08/2016    TP 6.6 11/08/2016    AST 18 11/08/2016    ALK 83 11/08/2016    BICARB 29 11/08/2016    ALT 9 11/08/2016    GLU 114 (H) 11/08/2016     Lab Results   Component Value Date    AST 18  11/08/2016    ALT 9 11/08/2016    LDH 223 (H) 11/08/2016    ALK 83 11/08/2016    TP 6.6 11/08/2016    ALB 3.3 (L) 11/08/2016    TBILI 0.32 11/08/2016       Images:  No images are attached to the encounter.    Impression/Plan: In depth discussion with pt and son,   Recommend replacing gtube due to tube malfunction, I feel that it is early to remove  gtube at this point.  Offered low profile tube less burdensome with activities (demonstrated mic key device).    Risks pf removal of gtube- replacement can be more difficult in the future, ostomy may not close, if unable to maintain PO intake may need enteral support again in the future, low risk for pain or infection.    Pt still requesting that he wants to have gtube removed, son was in support of pt decision     I recommended waiting a longer duration pt did not want to proceed with my recommendation for more conservative management or use of low profile tube.  Exiting tube has failed and is no longer functional     Balloon was deflated, and 20 gauge gtube removed. sterile dressing applied.    Dressing changes prn if site does not close in 7-10 days to call office     Continue with PO intake.  Hopefully new enteral support will not be needed but pt is aware that is a possibility in the future       I am having Alan [name] maintain his acyclovir, HYDROcodone-acetaminophen, docusate sodium, senna, aspirin, pantoprazole, and ondansetron.  Lab No orders of the defined types were placed in this encounter.    Imaging No orders of the defined types were placed in this encounter.    Procedures No orders of the defined types were placed in this encounter.    Other No orders  of the defined types were placed in this encounter.

## 2016-11-24 NOTE — Patient Instructions (Signed)
Change the dressing over the gtube site as needed    If the site does not close in the next 10 days call the office you may need a procedure to close it, but I expect the site to clsoe

## 2016-12-13 ENCOUNTER — Encounter (INDEPENDENT_AMBULATORY_CARE_PROVIDER_SITE_OTHER): Payer: Medicare Other | Admitting: Gastroenterology

## 2016-12-19 ENCOUNTER — Encounter (HOSPITAL_BASED_OUTPATIENT_CLINIC_OR_DEPARTMENT_OTHER): Payer: Self-pay

## 2016-12-20 ENCOUNTER — Other Ambulatory Visit (HOSPITAL_BASED_OUTPATIENT_CLINIC_OR_DEPARTMENT_OTHER): Payer: Medicare Other

## 2016-12-20 ENCOUNTER — Ambulatory Visit: Payer: Medicare Other | Attending: Hematology & Oncology | Admitting: Hematology & Oncology

## 2016-12-20 VITALS — BP 160/80 | HR 103 | Temp 97.9°F | Resp 15 | Ht 66.0 in | Wt 86.5 lb

## 2016-12-20 DIAGNOSIS — D649 Anemia, unspecified: Secondary | ICD-10-CM | POA: Insufficient documentation

## 2016-12-20 DIAGNOSIS — C9000 Multiple myeloma not having achieved remission: Secondary | ICD-10-CM | POA: Insufficient documentation

## 2016-12-20 DIAGNOSIS — R5383 Other fatigue: Secondary | ICD-10-CM | POA: Insufficient documentation

## 2016-12-20 DIAGNOSIS — E538 Deficiency of other specified B group vitamins: Principal | ICD-10-CM | POA: Insufficient documentation

## 2016-12-20 LAB — IBC - IRON BINDING CAPACITY
Iron Saturation: 28 %
Iron: 77 ug/dL (ref 59–158)
Total IBC: 278 ug/dL (ref 148–506)
UIBC: 201 ug/dL (ref 112–346)

## 2016-12-20 LAB — COMPREHENSIVE METABOLIC PANEL, BLOOD
ALT (SGPT): 10 U/L (ref 0–41)
AST (SGOT): 19 U/L (ref 0–40)
Albumin: 3.6 g/dL (ref 3.5–5.2)
Alkaline Phos: 66 U/L (ref 40–129)
Anion Gap: 13 mmol/L (ref 7–15)
BUN: 11 mg/dL (ref 8–23)
Bicarbonate: 28 mmol/L (ref 22–29)
Bilirubin, Tot: 0.37 mg/dL (ref <1.2)
Calcium: 8.9 mg/dL (ref 8.5–10.6)
Chloride: 100 mmol/L (ref 98–107)
Creatinine: 0.7 mg/dL (ref 0.67–1.17)
GFR: >60 mL/min
Glucose: 93 mg/dL (ref 70–99)
Potassium: 3.6 mmol/L (ref 3.5–5.1)
Sodium: 141 mmol/L (ref 136–145)
Total Protein: 6.6 g/dL (ref 6.0–8.0)

## 2016-12-20 LAB — LDH, BLOOD: LDH: 212 U/L — ABNORMAL HIGH (ref 25–175)

## 2016-12-20 LAB — IMMUNOGLOBULIN PANEL (IGA,IGG,IGM), BLOOD
IGA: 109 mg/dL (ref 70–400)
IGG: 1222 mg/dL (ref 700–1600)
IGM: 16 mg/dL — ABNORMAL LOW (ref 40–230)

## 2016-12-20 LAB — KAPPA LAMBDA FREE LIGHT CHAIN W/ RATIO, BLOOD
Kappa Free Light Chains Quant: 54.8 mg/L — ABNORMAL HIGH (ref 3.3–19.4)
Kappa:Lambda Free Light Chain Ratio: 4.94 — ABNORMAL HIGH (ref 0.26–1.65)
Lambda Free Light Chains Quant: 11.1 mg/L (ref 5.7–26.3)

## 2016-12-20 LAB — CBC WITH DIFF, BLOOD
ANC-Automated: 2.6 10*3/uL (ref 1.6–7.0)
ANC-Instrument: 2.6 10*3/uL (ref 1.6–7.0)
Abs Lymphs: 1.9 10*3/uL (ref 0.8–3.1)
Abs Monos: 0.3 10*3/uL (ref 0.2–0.8)
Hct: 36.1 % — ABNORMAL LOW (ref 40.0–50.0)
Hgb: 11.4 gm/dL — ABNORMAL LOW (ref 13.7–17.5)
Lymphocytes: 39 %
MCH: 27.9 pg (ref 26.0–32.0)
MCHC: 31.6 g/dL — ABNORMAL LOW (ref 32.0–36.0)
MCV: 88.3 um3 (ref 79.0–95.0)
MPV: 9.9 fL (ref 9.4–12.4)
Monocytes: 6 %
Plt Count: 187 10*3/uL (ref 140–370)
RBC: 4.09 10*6/uL — ABNORMAL LOW (ref 4.60–6.10)
RDW: 17.8 % — ABNORMAL HIGH (ref 12.0–14.0)
Segs: 54 %
WBC: 4.8 10*3/uL (ref 4.0–10.0)

## 2016-12-20 LAB — CORTISOL, BLOOD: Cortisol: 8.6 ug/dL

## 2016-12-20 NOTE — Interdisciplinary (Signed)
Wellbeing screening assessment declined, patient declined printed materials or referrals at this time. Will refer to social work if needed

## 2016-12-20 NOTE — Progress Notes (Signed)
BONE MARROW TRANSPLANT      Diagnosis: Multiple myeloma    Primary Care Physician: Alan Mo, MD    Myeloma Data at Diagnosis:  Date of diagnosis:   Age: 71  Hgb:   Creat:   Alb:   Calcium:   B2M: 2.5  Bone marrow plasma cell %:   Cytogenetics/FISH:  Skeletal survey:    Free light chains: kappa 1005.1/lambda 7.1; ratio 141.56   SPEP/ M-protein: 3.1  IFE: IgG kappa  R-ISS:  ISS: unknown  [place]:    Interval History:  Since his last appt - his G-tube malfunctioned and at his request was removed.  He is eating small meals (liquids and solids) ~5x/day.  He is feeling better than he has for a long time-   His breathing is clearly much improved - he has no more dyspnea.   Per his son, Alan [name], he is actually now able to climb the 3 flights of stairs in his house slowly but with no limitations from breathing.  He is very happy - and has self discontinued all his medications    Oncologic History:  Alan [name] is a 71 yo M diagnosed with IgG kappa MM 9/17 when he presented with back pain, weight loss.  He had an abnl chest X-ray which lead to a chest CT which noted multiple lytic lesions in thoracic vertebrae, as well as compression fractures.  He was referred to Dr. Charlaine [name] for further evaluation.  He had a bone marrow biopsy that showed MM.  He had significant pain from his spine lesions and had palliative radiation from 03/24/16-04/11/16 from T9-T11 and exophytic masses.  His pain resolved once he finished radiation therapy.      He was then started on Revlimid 20 mg po daily 2 weeks on 1 week off as well as Velcade SQ and dexamethasone    He had several hospitalizations for infection, difficulty with weight gain, eating.  He was placed on TPN and then transitioned to tube feeds by 11/17.      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  At this point, he's off of dexamethasone.  He is taking Revlimid 5 mg po daily 1 week on 1 week off.    Given his  significant debilitation and illness, he moved to [facility] to have more support/caregiving.  He and his wife have moved in with their son, Alan [name] in [place]. They have another son Alan [name]) and daughter in [place].    Hospitalizations and significant events:   9/26-10/4: Admitted with N/V and failure to thrive, felt to have gastritis  10/16-10/25: Admitted with aspiration pneumonia; tx with vanc/zosyn/azithro  05/18/16: PEG tube placed  06/18/16: ER visit for abdominal pain at PEG site    07/2016: Attempted lenalidomide 10 mg (was on 5 mg every other week prior to moving).  Did not tolerate so went to 10 mg every other day until complete  08/2016: Changed to pom/dex with pomalidomide 4 mg po daily days 1-21 and dex 20 mg po weekly.  Tolerance was about the same as lenalidomide.  But M-spike responded.  09/2016: Took him off of pom/dex after only 1 cycle due to significant frailty and no improvement with improvement in myeloma.  Not clear pom/dex is actually what is causing his debilitation/frailty/failure to thrive, but feel like he is too frail to consider continued therapy when myeloma has responded.      Review of Systems:  General: No fever, chills.  +fatigued very easily  Eyes: No icterus, vision change, double vision, eye pain, blurry vision, or floaters.   Oral/throat: No oral pain, oral ulcers or lesions, tooth pain, sore throat. +difficulty swallowing pills especially  Nose: No nasal discharge, sinus pain.   Ears: No change in hearing or ear pain.   Neck: No pain, no masses.  Cardiac: No chest pain or pressure, palpitations, or edema.   Pulmonary: No chest pain, SOB resolved  Abdomen: No pain, bloating, nausea resolved, diarrhea, +occasional constipation - takes MOM, melena, or blood per rectum.   Genitourinary: No dysuria, change in urinary frequency.   Extremities: No swelling, pain +numbness in feet, also fingertips.   Skin: No rash, lesions, pruritus.   Neurologic:  No headache, numbness, +general  weakness, balance problems.   Psych: Normal mood     Past Medical/Surgical Hx:   1. Multiple myeloma  2. Retinal detachment 2006    Social History:   He is married. He and his wife moved from [place] to the [place] in 2002.  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  Since 2010 he's been very involved with an ashram in [place], [place].  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    He denies EtOH/never smoker/no smokeless tobacco.  He is vegetarian  He has 3 children, 2 sons and 1 daughter    Family History:   He has no known family h/o cancer.  Father died when patient was only 3 mos but he is not aware of cause    Medications:    Current Outpatient Prescriptions:     acyclovir (ZOVIRAX) 400 MG tablet, Take 1 tablet (400 mg) by mouth 2 times daily., Disp: 60 tablet, Rfl: 3    aspirin 81 MG chewable tablet, Take 1 tablet (81 mg) by mouth daily., Disp: 30 tablet, Rfl: 3    docusate sodium (COLACE) 250 MG capsule, Take 1 capsule (250 mg) by mouth 2 times daily., Disp: 60 capsule, Rfl: 2    HYDROcodone-acetaminophen (NORCO) 5-325 MG tablet, Take 1 tablet by mouth every 6 hours as needed for Moderate Pain (Pain Score 4-6)., Disp: , Rfl:     ondansetron (ZOFRAN ODT) 4 MG disintegrating tablet, Take 1 tablet (4 mg) by mouth every 8 hours as needed for Nausea., Disp: 30 tablet, Rfl: 3    pantoprazole (PROTONIX) 40 MG packet, Take 40 mg by mouth daily., Disp: 1 packet, Rfl: 3    senna (SENOKOT) 8.6 MG tablet, Take 1 tablet (8.6 mg) by mouth 2 times daily as needed for Constipation., Disp: 120 tablet, Rfl: 3    Allergies:  Review of patient's allergies indicates no known allergies.    Objective:   12/20/16  1118   BP: 160/80   Pulse: 103   Resp: 15   Temp: 97.9 F (36.6 C)   SpO2: 99%       Physical Exam:   Karnofsky performance status: 50  General: Thin, chronically ill appearing male, no distress - in a wheelchair  HEENT: EOMI, PERRL, sclera anicteric, conjunctiva pink and moist,  oral cavity without lesions or ulcers, tonsils normal   Neck: Supple, no lymphadenopathy   Lungs: Clear to auscultation bilaterally, no wheezes, rubs, or rales.    Cardiac: No JVD, tachycardic, normal rhythm, normal S1, S2, no murmurs or gallops   Abdomen: Not distended, normal bowel sounds, soft, non-tender, no organomegaly, exam limited in wheelchair as weak  to transfer  Extremities: Warm, well perfused, no cyanosis, no clubbing, no edema   Skin: No jaundice, no petechiae, no purpura   Lymph: No lymphadenopathy at cervical, supraclavicular, axillary, or inguinal lymph nodes   Neurologic: Awake, alert, oriented.  +BLE weakness R>L    Lab results:  Lab Results   Component Value Date    WBC 4.8 12/20/2016    RBC 4.09 (L) 12/20/2016    HGB 11.4 (L) 12/20/2016    HCT 36.1 (L) 12/20/2016    MCV 88.3 12/20/2016    MCHC 31.6 (L) 12/20/2016    RDW 17.8 (H) 12/20/2016    PLT 187 12/20/2016    MPV 9.9 12/20/2016    SEG 54 12/20/2016    LYMPHS 39 12/20/2016    MONOS 6 12/20/2016    EOS 1 09/19/2016    BASOS 2 09/19/2016     Lab Results   Component Value Date    NA 141 12/20/2016    K 3.6 12/20/2016    CL 100 12/20/2016    BICARB 28 12/20/2016    BUN 11 12/20/2016    CREAT 0.70 12/20/2016    GLU 93 12/20/2016    [place] 8.9 12/20/2016     Lab Results   Component Value Date    AST 19 12/20/2016    ALT 10 12/20/2016    LDH 212 (H) 12/20/2016    ALK 66 12/20/2016    TP 6.6 12/20/2016    ALB 3.6 12/20/2016    TBILI 0.37 12/20/2016     OSH Labs:   03/16/2016 04/27/2016 06/07/2016 06/18/2016   kappa 1005.1 44.4 136.9 142.7   lambda 7.1 5.4 23.4 24.4   k/l 141.56 8.22 5.85 5.85   IgG 3817 1132 1390    M-spike 3.1 0.8 0.7      Results for JAMARION, JUMONVILLE (MRN [id number]) as of 08/10/2016 15:03   Ref. Range 07/26/2016 11:55 08/24/2016 09/19/2016 10/21/2016 12/20/16   M-Spike 1 (Gamma) Latest Ref Range: 0.00 - 0.01 gm/dL 1.15 (H)   0.15 0.48   IGA Latest Ref Range: 70 - 400 mg/dL 63 (L) 68 97 80 109   IGG Latest Ref Range: 700 - 1600 mg/dL 1773 (H)  1242 [phone]   IGM Latest Ref Range: 40 - 230 mg/dL 12 (L) _0 Kappa Free Light Chains Quant Latest Ref Range: 3.3 - 19.4 mg/L 120.3 (H) 72.5 30.1 45.4 54.8   Lambda Free Light Chains Quant Latest Ref Range: 5.7 - 26.3 mg/L 13.4 12.5 11.2 16.8 11.1   Kappa:Lambda Free Light Chain Ratio Latest Ref Range: 0.26 - 1.65  8.98 (H) 5.8 2.69 2.70 4.94       OSH results:  CT chest w/o contrast 03/15/16  IMPRESSION:   1. Multiple lytic lesions throughout the thoracic spine and ribs asdescribed suggesting metastatic disease.   2. The lesion evident on the chest x-ray corresponds with a 5.7 x 3.3 x 4.5 cm mass infiltrating the left T9 rib with extension to theleft T8-9 foramen but no invasion of the foramen.   3. 4.1 x 3.1 cm exophytic mass lesion at T10 with pathologicfracture. The tumor extends into the spinal canal with moderatecentral canal stenosis and right greater than left foraminalnarrowing at T9-10 and T10-11.   4. Prominent infiltrative tumor posteriorly at T11 withoutcompression fracture.   5. Additional pathologic fracture within the more distal left ninthrib.   6. Marked osteopenia with probable hemangioma is an high suspicion for multiple additional lesions  throughout the thoracic spine. Recommend MRI of the thoracic spine without and with contrast for further evaluation   7. Multiple hypodense lesions within the liver also raise concern for metastatic disease. Dedicated CT of the abdomen and pelvis withcontrast is recommended for further evaluation of metastatic diseaseand possible primary source.   8. Emphysema and biapical scarring in the lungs without evidence forprimary tumor.    Bone Survey 03/16/16  1. There is probable pathologic fracture of T10 vertebral body suspicious for bony lesion. Expansile lesion of left posterior eighth rib suspicious for metastatic disease.     PET 03/22/16  1. Widespread lytic lesions throughout the visualized axial and appendicular skeleton,  compatible with the reported clinical history  of multiple myeloma. This is most notable for the lytic lesion with pathologic compression fracture in the T10 vertebral body. No  definite extra-skeletal involvement noted on today's examination, which was limited by lack of acquisition of diagnostic PET images  secondary to patient pain during the examination.  2. Aortic atherosclerosis.  3. Additional incidental findings, as above.      Pathology:  OSH BMBx 03/25/16  1. Plasma cell neoplasm with mildy atypical plasma cells (>90% of the sampled marrow in the clot sections)  2. Abnormal FISH result: CCND1/IGH translocation with aneuploidy - t(11;14).  Aneuploid for all probes.  No evidence of t(4;14) or t(14;16)  3. Abnormal SNP microarray result, see separate report for details    Karyotype: 46,XY[20]  MM FISH: CCND1/IGH translocation with aneuploidy - t(11;14)    ASSESSMENT AND PLAN:  71 yo M with IgG kappa multiple myeloma.  He has been on therapy with RVD, but due to multiple hospitalizations, has been unable to tolerate full dose therapy.  We started on pomalidomide 4 mg po days 1-21 along with weekly dexamethasone 20 mg but stopped therapy after a month (M-spike dropped nicely but did have some sx, not clear if attributable to pom/dex as continues with them: nausea, decreased appetite)    #IgG kappa MM:   From his exam and presentation, appears he may have had cord compression at diagnosis. His BLE are weak. However, he notes this is how it has been since diagnosis.  At this point, already radiated and had therapy, so no further spine imaging for now unless new symptoms.   Given his debilitation I am concerned he will not tolerate triplet therapy.     He did not tolerate even a small increase in his Rev dosing, and yet his M-spike is rising. I think we need to change therapy.  I would rather a faster response, so will try pom/dex rather than ix/dex.  Did also discuss infusion options such as carfilzomib - but  as he already has dyspnea I think this will be problematic.  Daratumumab is also an option, however given travel and distance - will attempt oral therapy first if possible     We did one cycle of full dose pom/dex, which did not seem to worsen symptoms - and did clear his M-spike with 1 cycle. However, he continues to be quite debilitated and almost bed bound.  Stopped pom/dex ~3 mos ago, will continue to hold given significantly frailty and failure to thrive.  Will consider doing another cycle if has increase in M-spike consistently; versus changing to other therapy.   Continue Zometa q3 mos - reminded him to schedule as he has not had a dose here yet    #B12 deficiency:   Per outside records previously deficient, tx with  B12 injections, now appears resolved   Monitor B12, likely continue oral supplements    #Dysphagia to solids:   Referral to speech, especially in setting of hospitalization for aspiration PNA 10/17 - has already seen at home and apparently no concern for aspiration per notes    #Dyspnea:   Appreciate pulmonary consult - needs to schedule PFTs with neuromuscular battery    #Malnutrition:   ?due to dysphagia/failure to thrive   G-tube malfunction, so removed on 11/24/16    #Debilitation:   Suspect some of his weakness is sequelae of cord compression   HH for PT/OT      #Constipation:   Scheduled colace bid   Prn senna, prn MOM    Plan:  Oral vitamin B12 repletion  Continue q 3 month Zometa, though HOLD pom/dex still  He plans to travel to [place] 7/18 - will see him just prior to this visit  M-spike climbing but given such improvement in his dyspnea, po intake - will continue to hold therapy for now. Will need to discuss with him resuming therapy at some point.  May be worth one more trial of pom/dex to see if indeed this combo of imid and dex causing some of his sx, or if his weakness/dyspnea, decreased oral intake were related to some other prior insult

## 2016-12-20 NOTE — Patient Instructions (Addendum)
PLAN:  1. F/u with me on 01/19/17 at 11:00  2. We will ask the infusion center to reschedule your Zometa the same         CASE MANAGERS:  Larey Days, RN     Phone # [phone]  Fax # [phone]  Pager # [phone]    After office hours MD on call line: [phone]. Ask to speak with BMT on call doctor     **Call to schedule, cancel, or reschedule clinic appointments: [phone]    [place]: [phone]  [place]: [phone]  Social Worker Lamonte Sakai): [phone]

## 2016-12-21 LAB — EPG, INTERPRETATION, SERUM

## 2016-12-21 LAB — VITAMIN B12, BLOOD: Vitamin B12: 505 pg/mL (ref 232–1245)

## 2016-12-21 LAB — IMMUNOFIXATION, BLOOD

## 2016-12-21 LAB — FERRITIN, BLOOD: Ferritin: 118 ng/mL (ref 30–400)

## 2016-12-21 LAB — IMMUNOFIX INTERPRETATION, SERUM

## 2016-12-22 LAB — EPG, SERUM
A/G Ratio, EPG: 1.16
Albumin, EPG: 3.54 gm/dL (ref 3.20–5.00)
Alpha 1, EPG: 0.24 gm/dL (ref 0.10–0.40)
Alpha 2, EPG: 0.57 gm/dL — ABNORMAL LOW (ref 0.60–1.10)
Beta, EPG: 0.92 gm/dL (ref 0.60–1.30)
Gamma, EPG: 1.33 gm/dL (ref 0.70–1.50)
M-Spike 1 (Gamma): 0.48 gm/dL — ABNORMAL HIGH (ref 0.00–0.01)
Total Protein, EPG: 6.6 gm/dL (ref 6.00–8.00)

## 2016-12-23 MED ORDER — CYANOCOBALAMIN 1000 MCG OR TBCR
1000.0000 ug | EXTENDED_RELEASE_TABLET | Freq: Every day | ORAL | 3 refills | Status: DC
Start: 2016-12-23 — End: 2016-12-28

## 2016-12-26 ENCOUNTER — Encounter (HOSPITAL_BASED_OUTPATIENT_CLINIC_OR_DEPARTMENT_OTHER): Payer: Self-pay

## 2016-12-27 ENCOUNTER — Encounter: Payer: Self-pay | Admitting: Internal Medicine

## 2016-12-28 ENCOUNTER — Encounter (HOSPITAL_BASED_OUTPATIENT_CLINIC_OR_DEPARTMENT_OTHER): Payer: Self-pay

## 2016-12-28 DIAGNOSIS — E538 Deficiency of other specified B group vitamins: Principal | ICD-10-CM

## 2016-12-28 MED ORDER — CYANOCOBALAMIN 1000 MCG OR TBCR
1000.0000 ug | EXTENDED_RELEASE_TABLET | Freq: Every day | ORAL | 3 refills | Status: AC
Start: 2016-12-28 — End: ?

## 2017-01-10 ENCOUNTER — Encounter (HOSPITAL_BASED_OUTPATIENT_CLINIC_OR_DEPARTMENT_OTHER): Payer: Self-pay

## 2017-01-19 ENCOUNTER — Ambulatory Visit (HOSPITAL_BASED_OUTPATIENT_CLINIC_OR_DEPARTMENT_OTHER): Payer: Medicare Other | Admitting: Hematology & Oncology

## 2017-01-19 ENCOUNTER — Ambulatory Visit (HOSPITAL_BASED_OUTPATIENT_CLINIC_OR_DEPARTMENT_OTHER): Admit: 2017-01-19 | Discharge: 2017-01-19 | Disposition: A | Discharge status: Home Health | Payer: Medicare Other

## 2017-01-19 ENCOUNTER — Ambulatory Visit
Admission: RE | Admit: 2017-01-19 | Discharge: 2017-01-19 | Disposition: A | Discharge status: Home / Self Care | Payer: Medicare Other | Attending: Hematology & Oncology | Admitting: Hematology & Oncology

## 2017-01-19 VITALS — BP 138/99 | HR 104 | Temp 98.4°F | Resp 16 | Ht 66.0 in | Wt 87.0 lb

## 2017-01-19 DIAGNOSIS — C9000 Multiple myeloma not having achieved remission: Principal | ICD-10-CM

## 2017-01-19 LAB — KAPPA LAMBDA FREE LIGHT CHAIN W/ RATIO, BLOOD
Kappa Free Light Chains Quant: 51.9 mg/L — ABNORMAL HIGH (ref 3.3–19.4)
Kappa:Lambda Free Light Chain Ratio: 5.58 — ABNORMAL HIGH (ref 0.26–1.65)
Lambda Free Light Chains Quant: 9.3 mg/L (ref 5.7–26.3)

## 2017-01-19 LAB — CBC WITH DIFF, BLOOD
ANC-Automated: 2.4 10*3/uL (ref 1.6–7.0)
ANC-Instrument: 2.4 10*3/uL (ref 1.6–7.0)
Abs Lymphs: 1.6 10*3/uL (ref 0.8–3.1)
Abs Monos: 0.3 10*3/uL (ref 0.2–0.8)
Eosinophils: 1 %
Hct: 34.7 % — ABNORMAL LOW (ref 40.0–50.0)
Hgb: 10.8 gm/dL — ABNORMAL LOW (ref 13.7–17.5)
Lymphocytes: 38 %
MCH: 27.8 pg (ref 26.0–32.0)
MCHC: 31.1 g/dL — ABNORMAL LOW (ref 32.0–36.0)
MCV: 89.2 um3 (ref 79.0–95.0)
MPV: 10.5 fL (ref 9.4–12.4)
Monocytes: 7 %
Plt Count: 163 10*3/uL (ref 140–370)
RBC: 3.89 10*6/uL — ABNORMAL LOW (ref 4.60–6.10)
RDW: 16.5 % — ABNORMAL HIGH (ref 12.0–14.0)
Segs: 55 %
WBC: 4.3 10*3/uL (ref 4.0–10.0)

## 2017-01-19 LAB — COMPREHENSIVE METABOLIC PANEL, BLOOD
ALT (SGPT): 10 U/L (ref 0–41)
AST (SGOT): 23 U/L (ref 0–40)
Albumin: 3.8 g/dL (ref 3.5–5.2)
Alkaline Phos: 63 U/L (ref 40–129)
Anion Gap: 12 mmol/L (ref 7–15)
BUN: 13 mg/dL (ref 8–23)
Bicarbonate: 24 mmol/L (ref 22–29)
Bilirubin, Tot: 0.23 mg/dL (ref <1.2)
Calcium: 8.3 mg/dL — ABNORMAL LOW (ref 8.5–10.6)
Chloride: 105 mmol/L (ref 98–107)
Creatinine: 0.55 mg/dL — ABNORMAL LOW (ref 0.67–1.17)
GFR: >60 mL/min
Glucose: 107 mg/dL — ABNORMAL HIGH (ref 70–99)
Potassium: 4.1 mmol/L (ref 3.5–5.1)
Sodium: 141 mmol/L (ref 136–145)
Total Protein: 6.5 g/dL (ref 6.0–8.0)

## 2017-01-19 LAB — LDH, BLOOD: LDH: 303 U/L — ABNORMAL HIGH (ref 25–175)

## 2017-01-19 LAB — IMMUNOGLOBULIN PANEL (IGA,IGG,IGM), BLOOD
IGA: 83 mg/dL (ref 70–400)
IGG: 1104 mg/dL (ref 700–1600)
IGM: 11 mg/dL — ABNORMAL LOW (ref 40–230)

## 2017-01-19 MED ORDER — SODIUM CHLORIDE 0.9 % IV BOLUS
250.00 mL | INJECTION | Freq: Once | INTRAVENOUS | Status: AC
Start: 2017-01-19 — End: 2017-01-19
  Administered 2017-01-19: 250 mL via INTRAVENOUS

## 2017-01-19 MED ORDER — SODIUM CHLORIDE 0.9 % IV SOLN
4.00 mg | Freq: Once | INTRAVENOUS | Status: AC
Start: 2017-01-19 — End: 2017-01-19
  Administered 2017-01-19: 4 mg via INTRAVENOUS
  Filled 2017-01-19: qty 5

## 2017-01-19 MED ORDER — SODIUM CHLORIDE 0.9 % IV SOLN
10.00 mL/h | Freq: Once | INTRAVENOUS | Status: AC
Start: 2017-01-19 — End: 2017-01-19
  Administered 2017-01-19: 10 mL/h via INTRAVENOUS

## 2017-01-19 NOTE — Progress Notes (Signed)
BONE MARROW TRANSPLANT      Diagnosis: Multiple myeloma    Primary Care Physician: Alan Mo, MD  Oncologist: Dr. Charlaine Dalton, MD    Myeloma Data at Diagnosis:  Date of diagnosis:   Age: 71  Hgb:   Creat:   Alb:   Calcium:   B2M: 2.5  Bone marrow plasma cell %:   Cytogenetics/FISH:  Skeletal survey:    Free light chains: kappa 1005.1/lambda 7.1; ratio 141.56   SPEP/ M-protein: 3.1  IFE: IgG kappa  R-ISS:  ISS: unknown  [place]:    Interval History:  Since his last appt - he continues to eat more and feel stronger.  Again, no G-tube x 2 mos now.  He is also doing more activity - continues to be able to walk thru the house and up 3 flights of stairs w/o any SOB.    He is very happy off all medications.  He is to get Zometa today.    He is planning to go to [place] next week and will continue care with Dr. Rogue [name] while there.  He may be there about 2 mos.  He is there for a yoga conference at the [facility].      Oncologic History:  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  He had an abnl chest X-ray which lead to a chest CT which noted multiple lytic lesions in thoracic vertebrae, as well as compression fractures.  He was referred to Dr. Charlaine [name] for further evaluation.  He had a bone marrow biopsy that showed MM.  He had significant pain from his spine lesions and had palliative radiation from 03/24/16-04/11/16 from T9-T11 and exophytic masses.  His pain resolved once he finished radiation therapy.      He was then started on Revlimid 20 mg po daily 2 weeks on 1 week off as well as Velcade SQ and dexamethasone    He had several hospitalizations for infection, difficulty with weight gain, eating.  He was placed on TPN and then transitioned to tube feeds by 11/17.      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  At this point, he's off of dexamethasone.  He is taking Revlimid 5 mg  po daily 1 week on 1 week off.    Given his significant debilitation and illness, he moved to [facility] to have more support/caregiving.  He and his wife have moved in with their son. Children:  Sons: Alan [name] and Alan [name] in [place].   Daughter: Alan [name]    Hospitalizations and significant events:   9/26-10/4: Admitted with N/V and failure to thrive, felt to have gastritis  10/16-10/25: Admitted with aspiration pneumonia; tx with vanc/zosyn/azithro  05/18/16: PEG tube placed  06/18/16: ER visit for abdominal pain at PEG site    07/2016: Attempted lenalidomide 10 mg (was on 5 mg every other week prior to moving).  Did not tolerate so went to 10 mg every other day until complete  08/2016: Changed to pom/dex with pomalidomide 4 mg po daily days 1-21 and dex 20 mg po weekly.  Tolerance was about the same as lenalidomide.  But M-spike responded.  09/2016: Took him off of pom/dex after only 1 cycle due to significant frailty and no improvement with improvement in myeloma.  Not clear pom/dex is actually what is causing his debilitation/frailty/failure to thrive, but feel like  he is too frail to consider continued therapy when myeloma has responded.      Review of Systems:  General: No fever, chills.  +fatigue improving  Eyes: No icterus, vision change, double vision, eye pain, blurry vision, or floaters.   Oral/throat: No oral pain, oral ulcers or lesions, tooth pain, sore throat.   Nose: No nasal discharge, sinus pain.   Ears: No change in hearing or ear pain.   Neck: No pain, no masses.  Cardiac: No chest pain or pressure, palpitations, or edema.   Pulmonary: No chest pain, SOB resolved  Abdomen: No pain, bloating, nausea resolved, diarrhea, +occasional constipation - much less problems now  Genitourinary: No dysuria, change in urinary frequency.   Extremities: No swelling, pain +numbness in feet, also fingertips.   Skin: No rash, lesions, pruritus.   Neurologic:  No headache, numbness, +general weakness, balance problems - also  improving  Psych: Normal mood     Past Medical/Surgical Hx:   1. Multiple myeloma  2. Retinal detachment 2006    Social History:   He is married. He and his wife moved from [place] to the [place] in 2002.  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    He denies EtOH/never smoker/no smokeless tobacco.  He is vegetarian  He has 3 children, 2 sons and 1 daughter    Family History:   He has no known family h/o cancer.  Father died when patient was only 3 mos but he is not aware of cause    Medications:    Current Outpatient Prescriptions:     acyclovir (ZOVIRAX) 400 MG tablet, Take 1 tablet (400 mg) by mouth 2 times daily., Disp: 60 tablet, Rfl: 3    cyanocobalamin ER 1000 MCG tablet, Take 1 tablet (1,000 mcg) by mouth daily., Disp: 30 tablet, Rfl: 3    Allergies:  Review of patient's allergies indicates no known allergies.    Objective:   01/19/17  1114   BP: (!) 138/99   Pulse: 104   Resp: 16   Temp: 98.4 F (36.9 C)   SpO2: 97%       Physical Exam:   Karnofsky performance status: 60  General: Thin, chronically ill appearing male, no distress - in a wheelchair  HEENT: EOMI, PERRL, sclera anicteric, conjunctiva pink and moist, oral cavity without lesions or ulcers, tonsils normal   Neck: Supple, no lymphadenopathy   Lungs: Clear to auscultation bilaterally, no wheezes, rubs, or rales.    Cardiac: No JVD, tachycardic, normal rhythm, normal S1, S2, no murmurs or gallops   Abdomen: Not distended, normal bowel sounds, soft, non-tender, no organomegaly, exam limited in wheelchair as weak to transfer  Extremities: Warm, well perfused, no cyanosis, no clubbing, no edema   Skin: No jaundice, no petechiae, no purpura   Lymph: No lymphadenopathy at cervical, supraclavicular, axillary, or inguinal lymph nodes   Neurologic: Awake, alert, oriented.  +BLE weakness R>L    Lab  results:  Lab Results   Component Value Date    WBC 4.3 01/19/2017    RBC 3.89 (L) 01/19/2017    HGB 10.8 (L) 01/19/2017    HCT 34.7 (L) 01/19/2017    MCV 89.2 01/19/2017    MCHC 31.1 (L) 01/19/2017    RDW 16.5 (H) 01/19/2017    PLT 163 01/19/2017  MPV 10.5 01/19/2017    SEG 55 01/19/2017    LYMPHS 38 01/19/2017    MONOS 7 01/19/2017    EOS 1 01/19/2017    BASOS 2 09/19/2016     Lab Results   Component Value Date    NA 141 01/19/2017    K 4.1 01/19/2017    CL 105 01/19/2017    BICARB 24 01/19/2017    BUN 13 01/19/2017    CREAT 0.55 (L) 01/19/2017    GLU 107 (H) 01/19/2017    Sully 8.3 (L) 01/19/2017     Lab Results   Component Value Date    AST 23 01/19/2017    ALT 10 01/19/2017    LDH 303 (H) 01/19/2017    ALK 63 01/19/2017    TP 6.5 01/19/2017    ALB 3.8 01/19/2017    TBILI 0.23 01/19/2017     OSH Labs:   03/16/2016 04/27/2016 06/07/2016 06/18/2016   kappa 1005.1 44.4 136.9 142.7   lambda 7.1 5.4 23.4 24.4   k/l 141.56 8.22 5.85 5.85   IgG 3817 1132 1390    M-spike 3.1 0.8 0.7      Results for Alan [name], Alan [name] (MRN [id number]) as of 08/10/2016 15:03   Ref. Range 07/26/2016 11:55 08/24/2016 09/19/2016 10/21/2016 12/20/16   M-Spike 1 (Gamma) Latest Ref Range: 0.00 - 0.01 gm/dL 1.15 (H)   0.15 0.48   IGA Latest Ref Range: 70 - 400 mg/dL 63 (L) 68 97 80 109   IGG Latest Ref Range: 700 - 1600 mg/dL 1773 (H) 1242 [phone]   IGM Latest Ref Range: 40 - 230 mg/dL 12 (L) _0 Kappa Free Light Chains Quant Latest Ref Range: 3.3 - 19.4 mg/L 120.3 (H) 72.5 30.1 45.4 54.8   Lambda Free Light Chains Quant Latest Ref Range: 5.7 - 26.3 mg/L 13.4 12.5 11.2 16.8 11.1   Kappa:Lambda Free Light Chain Ratio Latest Ref Range: 0.26 - 1.65  8.98 (H) 5.8 2.69 2.70 4.94       OSH results:  CT chest w/o contrast 03/15/16  IMPRESSION:   1. Multiple lytic lesions throughout the thoracic spine and ribs asdescribed suggesting metastatic disease.   2. The lesion evident on the chest x-ray corresponds with a 5.7 x 3.3 x 4.5 cm mass  infiltrating the left T9 rib with extension to theleft T8-9 foramen but no invasion of the foramen.   3. 4.1 x 3.1 cm exophytic mass lesion at T10 with pathologicfracture. The tumor extends into the spinal canal with moderatecentral canal stenosis and right greater than left foraminalnarrowing at T9-10 and T10-11.   4. Prominent infiltrative tumor posteriorly at T11 withoutcompression fracture.   5. Additional pathologic fracture within the more distal left ninthrib.   6. Marked osteopenia with probable hemangioma is an high suspicion for multiple additional lesions throughout the thoracic spine. Recommend MRI of the thoracic spine without and with contrast for further evaluation   7. Multiple hypodense lesions within the liver also raise concern for metastatic disease. Dedicated CT of the abdomen and pelvis withcontrast is recommended for further evaluation of metastatic diseaseand possible primary source.   8. Emphysema and biapical scarring in the lungs without evidence forprimary tumor.    Bone Survey 03/16/16  1. There is probable pathologic fracture of T10 vertebral body suspicious for bony lesion. Expansile lesion of left posterior eighth rib suspicious for metastatic disease.     PET 03/22/16  1. Widespread lytic lesions throughout the visualized  axial and appendicular skeleton, compatible with the reported clinical history  of multiple myeloma. This is most notable for the lytic lesion with pathologic compression fracture in the T10 vertebral body. No  definite extra-skeletal involvement noted on today's examination, which was limited by lack of acquisition of diagnostic PET images  secondary to patient pain during the examination.  2. Aortic atherosclerosis.  3. Additional incidental findings, as above.      Pathology:  OSH BMBx 03/25/16  1. Plasma cell neoplasm with mildy atypical plasma cells (>90% of the sampled marrow in the clot sections)  2. Abnormal FISH result: CCND1/IGH translocation  with aneuploidy - t(11;14).  Aneuploid for all probes.  No evidence of t(4;14) or t(14;16)  3. Abnormal SNP microarray result, see separate report for details    Karyotype: 46,XY[20]  MM FISH: CCND1/IGH translocation with aneuploidy - t(11;14)    ASSESSMENT AND PLAN:  71 yo M with IgG kappa multiple myeloma.  He has been on therapy with RVD, but due to multiple hospitalizations, has been unable to tolerate full dose therapy.  We started on pomalidomide 4 mg po days 1-21 along with weekly dexamethasone 20 mg but stopped therapy after a month (M-spike dropped nicely but did have some sx, not clear if attributable to pom/dex as continues with them: nausea, decreased appetite)    #IgG kappa MM:   From his exam and presentation, appears he may have had cord compression at diagnosis. His BLE are weak. However, he notes this is how it has been since diagnosis.  At this point, already radiated and had therapy, so no further spine imaging for now unless new symptoms.   Given his debilitation I am concerned he will not tolerate triplet therapy.     He did not tolerate even a small increase in his Rev dosing, and yet his M-spike is rising. I think we need to change therapy.  I would rather a faster response, so will try pom/dex rather than ix/dex.  Did also discuss infusion options such as carfilzomib - but as he already has dyspnea I think this will be problematic.  Daratumumab is also an option, however given travel and distance - will attempt oral therapy first if possible     We did one cycle of full dose pom/dex, which did not seem to worsen symptoms - and did clear his M-spike with 1 cycle. However, he continues to be quite debilitated and almost bed bound.  Stopped pom/dex ~4 mos ago, will continue to hold given significantly frailty and failure to thrive.     Discussed with [name] and Mr. Cooprider that M-spike is already rising again and quickly.  Will see what today's labs are like - but if any worsening of anemia  or new AKI, or if M-spike significantly increased, really should consider going back on therapy now.  If M-spike largely stable, can continue to monitor.  In terms of choices of therapy, still not clear if pom/dex lead to symptoms (as had symptoms prior to, but clearly felt better OFF therapy).  Could consider rechallenge with pom/dex (tolerated full dose pom w/o cytopenias) versus daratumumab/dexamethasone.  Only drawback to dara would be having to come to infusion weekly for the first 8 weeks, but per [name] they can manage this   Continue Zometa q3 mos    #B12 deficiency:   Per outside records previously deficient, tx with B12 injections, now appears resolved   Monitor B12, likely continue oral supplements    #Dysphagia to solids:  Referral to speech, especially in setting of hospitalization for aspiration PNA 10/17 - has already seen at home and apparently no concern for aspiration per notes    #Dyspnea:   Appreciate pulmonary consult - needs to schedule PFTs with neuromuscular battery - not yet done, however also his dyspnea appears to have resolved!    #Malnutrition:   ?due to dysphagia/failure to thrive   G-tube malfunction, so removed on 11/24/16   Now improved po intake    #Debilitation:   Suspect some of his weakness is sequelae of cord compression (?at diagnosis)   [place] for PT/OT - declined. However is gaining strength    #Constipation:   Prn prune juice now    Plan:  Oral vitamin B12 repletion  Continue q 3 month Zometa    He plans to travel to [place] 7/18 - he already has f/u scheduled with Dr. Rogue [name].  MM labs are pending today - if markedly worse, will need to initiate therapy fairly quickly in [place].  If able stable, can continue to monitor given frailty.  Can consider repeat pom/dex (used pom 4 mg days 1-21 and dex 12 mg po weekly) versus dara/dex    RTC as soon as returned from [name]

## 2017-01-19 NOTE — Interdisciplinary (Addendum)
Non-Chemotherapy Infusion Nursing Note - [place]      Alan Alan [name] is a 71 year old male who presents for infusion of Zometa 4 mg    Vitals:    01/19/17 1111 01/19/17 1355   BP:  (!) 137/95   Pulse:  105   Resp:  16   Temp:  98 F (36.7 C)   Weight: (!) 39.9 kg (87 lb 15.4 oz)    Height: 5\' 2"  (1.575 m)      Alan Score: 2  Body surface area is 1.32 meters squared.  Body mass index is 16.09 kg/(m^2).    Pre-Alan nursing assessment:  Alan arrived alert and wheelchair assisted by his son.    Accompanied by his son.  Alan denied N/V/D, fevers, rash.  24 gauge PIV inserted by [facility] nurse to L hand noted brisk blood return.  Alan pre-hydrated with 250 cc NS over 30 minutes.    Alan Alan [name] tolerated Alan well.    Post blood return: Brisk  Post-Flush: NS    Alan Alan [name]  Alan Alan [name]: Alan and Family  Barriers to learning: No Barriers  Readiness to learn: Acceptance  Method: Explanation    Alan Alan [name]: Information/teaching given to Alan including: signs and symptoms of infection, bleeding, adverse reaction(s), symptom control, and when to notify MD.    Alan Alan [name]: Instructed Alan to call for assistance.    Alan Alan [name]: Alan instructed to contact nurse if Alan should develop or if their current Alan therapy becomes ineffective.    Response: Verbalizes understanding    Discharge Plan  Discharge instructions given to Alan.  Future appointments given and reviewed with Alan plan.  Discharge Mode: wheelchair assisted  Discharge Time: 1400  Accompanied by: pt's son  Discharged To: Home

## 2017-01-19 NOTE — Interdisciplinary (Signed)
Nurses Note: Pt in for Peripheral vein line care and lab draw.  Vital signs stable and pt. in no apparent distress.  Pt reports no nausea, vomiting, constipation, or diarrhea.  Site clean, dry, and intact.  Left forearm accessed and blood return verified. Labs collected and sent.  Dressing and posiflow cap and curo applied.  Line flushed with NS.  Pt waiting for lab results.  Pt discharged to lobby, MD appt following FT. FT charge to f/u with labs for Zometa parameters.

## 2017-01-19 NOTE — Interdisciplinary (Signed)
Wellbeing screening assessment reviewed, patient declined printed materials or referrals at this time. Will refer to social work if needed

## 2017-01-19 NOTE — Patient Instructions (Signed)
PLAN:  1. Will fax records to Dr. Burlene Arnt.  2. Return to clinic when you return, with labs.    CASE MANAGERS:  Larey Days, RN     Phone # [phone]  Fax # [phone]  Pager # [phone]    After office hours MD on call line: [phone]. Ask to speak with BMT on call doctor     **Call to schedule, cancel, or reschedule clinic appointments: [phone]    [place]: [phone]  [place]: [phone]  Social Worker Lamonte Sakai): [phone]

## 2017-01-20 ENCOUNTER — Ambulatory Visit (HOSPITAL_BASED_OUTPATIENT_CLINIC_OR_DEPARTMENT_OTHER): Payer: Medicare Other

## 2017-01-20 ENCOUNTER — Ambulatory Visit (HOSPITAL_BASED_OUTPATIENT_CLINIC_OR_DEPARTMENT_OTHER): Admit: 2017-01-20 | Discharge: 2017-01-20 | Disposition: A | Discharge status: Home Health | Payer: Medicare Other

## 2017-01-20 LAB — IMMUNOFIXATION, BLOOD

## 2017-01-20 LAB — IMMUNOFIX INTERPRETATION, SERUM

## 2017-01-23 LAB — EPG, SERUM
A/G Ratio, EPG: 1.19
Albumin, EPG: 3.31 gm/dL (ref 3.20–5.00)
Alpha 1, EPG: 0.2 gm/dL (ref 0.10–0.40)
Alpha 2, EPG: 0.53 gm/dL — ABNORMAL LOW (ref 0.60–1.10)
Beta, EPG: 0.82 gm/dL (ref 0.60–1.30)
Gamma, EPG: 1.24 gm/dL (ref 0.70–1.50)
M-Spike 1 (Gamma): 0.52 gm/dL — ABNORMAL HIGH (ref 0.00–0.01)
Total Protein, EPG: 6.1 gm/dL (ref 6.00–8.00)

## 2017-01-23 LAB — EPG, INTERPRETATION, SERUM

## 2017-01-24 ENCOUNTER — Encounter (HOSPITAL_BASED_OUTPATIENT_CLINIC_OR_DEPARTMENT_OTHER): Payer: Self-pay

## 2017-01-31 ENCOUNTER — Inpatient Hospital Stay: Payer: Medicare Other | Attending: Internal Medicine | Admitting: Internal Medicine

## 2017-01-31 VITALS — BP 124/86 | HR 108 | Temp 97.6°F | Resp 20 | Ht 66.0 in | Wt 93.0 lb

## 2017-01-31 DIAGNOSIS — E538 Deficiency of other specified B group vitamins: Secondary | ICD-10-CM

## 2017-01-31 DIAGNOSIS — Z79899 Other long term (current) drug therapy: Secondary | ICD-10-CM

## 2017-01-31 DIAGNOSIS — Z931 Gastrostomy status: Secondary | ICD-10-CM | POA: Insufficient documentation

## 2017-01-31 DIAGNOSIS — G629 Polyneuropathy, unspecified: Secondary | ICD-10-CM | POA: Diagnosis not present

## 2017-01-31 DIAGNOSIS — C9002 Multiple myeloma in relapse: Secondary | ICD-10-CM | POA: Diagnosis present

## 2017-01-31 NOTE — Progress Notes (Signed)
Patient here to reestablish care with Dr. Rogue Bussing. He is currently not under any treatment. Noted improvement in his Performance status

## 2017-01-31 NOTE — Progress Notes (Signed)
[place] NOTE  Patient Care Team: Zhou-Talbert, Elwyn Lade, MD as PCP - General (Family Medicine) Christene Lye, MD (General Surgery) Leonie Green, MD as Referring Physician (Surgery)  Ms. Francisco [name]; [facility]  CHIEF COMPLAINTS/PURPOSE OF CONSULTATION:  Multiple lytic lesions  #  Oncology History   # SEP 2017- Multiple Myeloma lytic lesions- Thoracic spine/ vertebral compression fractures; IgG Kappa- 3.1gm/dl; K/L-140. Vel-Rev-   # FTT s/p PEG [Dr. Elliot];   # Abnormal CXR;? TB no treatment- chronic     Multiple myeloma in relapse (HCC)     HISTORY OF PRESENTING ILLNESS:  Francisco [name] 71 y.o.  male  With Diagnosis of multiple myeloma IgG kappa- multiple bone lesions/anemia- Currently off any treatment/surveillance.   In the interim he has moved back from  [place]. He plans to stay here for about 6 months.   Patient's PEG tube has been take. His appetite is improving. Eating by himself. He complains of mild tingling and numbness in the feet/however improve the strength in the legs.   Otherwise denies any chest pain. No nausea no vomiting.   ROS: A complete 10 point review of system is done which is negative except mentioned above in history of present illness  MEDICAL HISTORY:  Past Medical History:  Diagnosis Date  . Anemia   . Cancer (HCC)    Bone metastasis  . Constipation   . Hearing loss   . Hoarse voice quality   . Liver lesion   . Malaria 2003  . Multiple myeloma ([place]) 03/22/2016   Per Dr. Rogue Bussing on his PET order.  . Tuberculosis 1985    SURGICAL HISTORY: Past Surgical History:  Procedure Laterality Date  . PEG PLACEMENT N/A 05/18/2016   Procedure: PERCUTANEOUS ENDOSCOPIC GASTROSTOMY (PEG) PLACEMENT;  Surgeon: Manya Silvas, MD;  Location: [facility] ENDOSCOPY;  Service: Endoscopy;  Laterality: N/A;  . 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 No smoking or alcohol.  Social History   Social History  . Marital status: Married    Spouse name: N/A  . Number of children: N/A  . Years of education: N/A   Occupational History  . Not on file.   Social History Main Topics  . Smoking status: Never Smoker  . Smokeless tobacco: Never Used  . Alcohol use No  . Drug use: No  . Sexual activity: Not on file   Other Topics Concern  . Not on file   Social History Narrative  . No narrative on file    FAMILY HISTORY: no history of cancers in the family. Family History  Problem Relation Age of Onset  . Hypertension Mother     ALLERGIES:  has No Known Allergies.  MEDICATIONS:  Current Outpatient Prescriptions  Medication Sig Dispense Refill  . Cholecalciferol (PA VITAMIN D-3) 1000 units tablet Take 1 tablet (1,000 Units total) by mouth daily. 30 tablet 6   No current facility-administered medications for this visit.       Marland Kitchen  PHYSICAL EXAMINATION: ECOG PERFORMANCE STATUS: 3 - Symptomatic, >50% confined to bed  Vitals:   01/31/17 1130  BP: 124/86  Pulse: (!) 108  Resp: 20  Temp: 97.6 F (36.4 C)   Filed Weights   01/31/17 1137  Weight: 93 lb 0.6 oz (42.2 kg)    GENERAL: moderately nourished; moderately built; male patient; Alert, no distress and comfortable.   With family. In a wheelchair.  EYES: no  pallor or icterus OROPHARYNX: no thrush ulceration. NECK: supple, no masses felt LYMPH:  no palpable lymphadenopathy in the cervical, axillary or inguinal regions LUNGS: clear to auscultation and  No wheeze or crackles HEART/CVS: regular rate & rhythm and no murmurs; No lower extremity edema ABDOMEN: abdomen soft, non-tender and normal bowel sounds;PEG tube explanted.  Musculoskeletal:no cyanosis of digits and no clubbing  PSYCH: alert & oriented x 3 with fluent speech NEURO: no focal motor/sensory deficits SKIN:  no rashes or significant lesions  LABORATORY DATA:  I have  reviewed the data as listed Lab Results  Component Value Date   WBC 4.7 07/19/2016   HGB 10.8 (L) 07/19/2016   HCT 33.2 (L) 07/19/2016   MCV 86.5 07/19/2016   PLT 156 07/19/2016    Recent Labs  06/21/16 1131 06/28/16 0822 07/12/16 1405 07/19/16 1413  NA 130* 132* 132* 134*  K 3.3* 3.5 4.3 3.9  CL 95* 97* 99* 100*  CO2 _0 GLUCOSE 113* 107* 108* 97  BUN _1 21*  CREATININE 0.53* 0.50* 0.59* 0.57*  CALCIUM 8.2* 8.7* 8.6* 9.0  GFRNONAA >60 >60 >60 >60  GFRAA >60 >60 >60 >60  PROT 6.1* 6.6 6.9  --   ALBUMIN 2.9* 3.0* 3.3*  --   AST _2 --   ALT _3 --   ALKPHOS 105 86 96  --   BILITOT 0.6 0.5 0.4  --     RADIOGRAPHIC STUDIES: I have personally reviewed the radiological images as listed and agreed with the findings in the report. No results found.  Results for Francisco, [name] (MRN [id number]) as of 07/19/2016 15:04  Ref. Range 03/16/2016 14:59 04/27/2016 18:53 06/07/2016 13:47 06/28/2016 08:22  Kappa free light chain Latest Ref Range: 3.3 - 19.4 mg/L 1,005.1 (H) 44.4 (H) 136.9 (H) 142.7 (H)  Lamda free light chains Latest Ref Range: 5.7 - 26.3 mg/L 7.1 5.4 (L) 23.4 24.4  Kappa, lamda light chain ratio Latest Ref Range: 0.26 - 1.65  141.56 (H) 8.22 (H) 5.85 (H) 5.85 (H)  M Protein SerPl Elph-Mcnc Latest Ref Range: Not Observed g/dL 3.1 (H) 0.8 (H) 0.7 (H)       ASSESSMENT & PLAN:   Multiple myeloma in relapse (HCC) Multiple myeloma M protein- 3.1 g/dL; kappa/lambda ratio 140; multiple bone lesions. Status post RVD [poor tolerance]; status post pom-Dex [stopped in June 2018]- secondary to poor tolerance. Currently on surveillance.  # Most recent myeloma protein 01/19/2017 [[place]]- 0.5; kappa light chains 52; ratio 0.5. Hemoglobin around 10; normal kidney; calcium 8.2.   # Continue surveillance at this time; we will recheck the myeloma protein labs in approximately 1 month. Patient not too keen on starting treatment.  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 Stable.  # B12 deficiency-most recent checked 580; recommend by mouth B12 at home.   # Multiple bone lesions - currently on zometa every 3 months. - last July 12th; again planning October 2018  # follow up in 4 weeks/labs/ myeloma panel.  # Reviewed the records from [place] detail.; Summarized as above  All questions were answered. The patient knows to call the clinic with any problems, questions or concerns.    Cammie Sickle, MD 01/31/2017 2:00 PM

## 2017-01-31 NOTE — Assessment & Plan Note (Addendum)
Multiple myeloma M protein- 3.1 g/dL; kappa/lambda ratio 140; multiple bone lesions. Status post RVD [poor tolerance]; status post pom-Dex [stopped in June 2018]- secondary to poor tolerance. Currently on surveillance.  # Most recent myeloma protein 01/19/2017 [[place]]- 0.5; kappa light chains 52; ratio 0.5. Hemoglobin around 10; normal kidney; calcium 8.2.   # Continue surveillance at this time; we will recheck the myeloma protein labs in approximately 1 month. Patient not too keen on starting treatment.  # Peripheral neuropathy grade 1-2. Likely from Velcade. Stable.  # B12 deficiency-most recent checked 580; recommend by mouth B12 at home.   # Multiple bone lesions - currently on zometa every 3 months. - last July 12th; again planning October 2018  # follow up in 4 weeks/labs/ myeloma panel.  # Reviewed the records from [place] detail.; Summarized as above

## 2017-02-17 NOTE — Telephone Encounter (Signed)
Opened in error

## 2017-02-22 NOTE — Progress Notes (Signed)
04/10/16 1200  PT Visit Information  Last PT Received On 04/10/16  History of Present Illness 71 y/o male here with nausea and vomiting.  Until recently he was able to be active, walk "normally," etc.  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   Precautions  Precautions Fall  Restrictions  Weight Bearing Restrictions No  Home Living  Family/patient expects to be discharged to: Private residence  Living Arrangements Spouse/significant other  Available Help at Discharge Family (family is temporarily her from out of town)  Additional Comments unsure if he has a walker, he does not normally need one  Prior Function  Level of Independence Independent  Comments apparently he was able to walk as much as needed, be active out of the house, etc  Pain Assessment  Pain Assessment (reports general moderate pain in back and abdomen)  Cognition  Arousal/Alertness Lethargic  Behavior During Therapy Flat affect  Overall Cognitive Status Within Functional Limits for tasks assessed  Upper Extremity Assessment  Upper Extremity Assessment Generalized weakness (shows slow, weak effort with shoulder elevation and mobility)  Lower Extremity Assessment  Lower Extremity Assessment Generalized weakness (functional, but weak, LE AROM t/o LEs)  Bed Mobility  Overal bed mobility Modified Independent  General bed mobility comments Pt able to get his LEs back into bed w/o assist, slow and guarded with the effort  Transfers  Overall transfer level Modified independent  Equipment used Rolling walker (2 wheeled)  General transfer comment Pt was able to rise to standing and control descent with slow, cautious effort.   Ambulation/Gait  Ambulation/Gait assistance Min guard  Ambulation Distance (Feet) 50 Feet  Assistive device Rolling walker (2 wheeled)  General Gait Details Pt again with slow, cautious cadance/gait but no safety concens.  He normally does not need AD and walks  much faster but should be safe to get around the home w/ AD.   Balance  Overall balance assessment Modified Independent  PT - End of Session  Equipment Utilized During Treatment Gait belt  Activity Tolerance Patient limited by fatigue  Patient left with bed alarm set;with call bell/phone within reach;with family/visitor present  PT Assessment  PT Recommendation/Assessment Patient needs continued PT services  PT Problem List Decreased strength;Decreased activity tolerance;Decreased balance;Decreased mobility;Cardiopulmonary status limiting activity;Pain  PT Plan  PT Frequency (ACUTE ONLY) Min 2X/week  PT Treatment/Interventions (ACUTE ONLY) Gait training;DME instruction;Stair training;Functional mobility training;Therapeutic activities;Therapeutic exercise;Balance training;Neuromuscular re-education;Patient/family education  PT Recommendation  Follow Up Recommendations Home health PT  PT equipment Rolling walker with 5" wheels (unsure if pt has one already)  Individuals Consulted  Consulted and Agree with Results and Recommendations Patient;Family member/caregiver  Family Member Consulted daughter-in-law  Acute Rehab PT Goals  Patient Stated Goal go home  PT Goal Formulation With patient  Time For Goal Achievement 04/24/16  Potential to Achieve Goals Fair  PT Time Calculation  PT Start Time (ACUTE ONLY) 9622  PT Stop Time (ACUTE ONLY) 0850  PT Time Calculation (min) (ACUTE ONLY) 18 min  PT G-Codes **NOT FOR INPATIENT CLASS**  Functional Assessment Tool Used Clinical judgement  Functional Limitation Mobility: Walking and moving around  Mobility: Walking and Moving Around Current Status (W9798) CJ  Mobility: Walking and Moving Around Goal Status (X2119) CI  PT General Charges  $$ ACUTE PT VISIT 1 Procedure  PT Evaluation  $PT Eval Low Complexity 1 Procedure    Late-entry g-codes added after review of initial evaluation/documentation by Wayne Both, PT.  Reyes Ivan.  Owens Shark, PT,  DPT, NCS 02/22/17, 8:52 AM [phone]

## 2017-02-22 NOTE — Progress Notes (Signed)
05/02/16 1058  PT Visit Information  Last PT Received On 05/02/16  Assistance Needed +1  History of Present Illness Pt admitted for complaints of AMS, however appears to be at baseline orientation at this time. Pt with history of multiple myeloma, currently on radiation therapy. History of TB.  Precautions  Precautions Fall  Restrictions  Weight Bearing Restrictions No  Home Living  Family/patient expects to be discharged to: Private residence  Living Arrangements Spouse/significant other (children visiting from [facility])  Available Help at Discharge Family  Type of [place] Access Level entry  [place] One level  [place] - 2 wheels;Wheelchair - manual  Prior Function  Level of Independence Needs assistance  Comments earlier this month he was able to walk, however past few days prior to admission, only able to ambulate to recliner chair/toilet with RW  Communication  Communication No difficulties  Pain Assessment  Pain Assessment No/denies pain  Cognition  Arousal/Alertness Awake/alert  Behavior During Therapy WFL for tasks assessed/performed  Overall Cognitive Status Within Functional Limits for tasks assessed  Upper Extremity Assessment  Upper Extremity Assessment Generalized weakness (B UE grossly 3+/5)  Lower Extremity Assessment  Lower Extremity Assessment Generalized weakness (B LE grossly 3/5)  Bed Mobility  Overal bed mobility Needs Assistance  Bed Mobility Supine to Sit  Supine to sit Mod assist  General bed mobility comments assist for attempted bed mobility towards L side of bed. Assist required for sequencing and trunk support. Once seated at EOB, pt very dizzy, able to sit approx 5 minutes. Pt then needs max assist for scooting up towards [facility]  Transfers  General transfer comment unable at this time  Balance  Overall balance assessment Needs assistance  Sitting-balance support Feet supported  Sitting balance-Leahy Scale Poor  Exercises   Exercises Other exercises  Other Exercises  Other Exercises Supine ther-ex performed including B LE SLRs and hip abd/add. All ther-ex performed x 10 reps with min assist. Pt also able to sit at EOB for a few min with min assist and able to progress to only requiring cga. Pt fatigues quickly.  PT - End of Session  Equipment Utilized During Treatment Gait belt  Activity Tolerance Patient limited by fatigue  Patient left with bed alarm set;with call bell/phone within reach;with family/visitor present  Nurse Communication Mobility status  PT Assessment  PT Recommendation/Assessment Patient needs continued PT services  PT Problem List Decreased strength;Decreased activity tolerance;Decreased balance;Decreased mobility;Decreased knowledge of use of DME  PT Plan  PT Frequency (ACUTE ONLY) Min 2X/week  PT Treatment/Interventions (ACUTE ONLY) Gait training;DME instruction;Stair training;Functional mobility training;Therapeutic activities;Therapeutic exercise;Balance training;Neuromuscular re-education;Patient/family education  PT Recommendation  Follow Up Recommendations Home health PT;Supervision/Assistance - 24 hour  Individuals Consulted  Consulted and Agree with Results and Recommendations Patient;Family member/caregiver  Family Member Consulted daughter  Acute Rehab PT Goals  Patient Stated Goal go home  PT Goal Formulation With family  Time For Goal Achievement 05/16/16  Potential to Achieve Goals Fair  PT Time Calculation  PT Start Time (ACUTE ONLY) 1013  PT Stop Time (ACUTE ONLY) 1029  PT Time Calculation (min) (ACUTE ONLY) 16 min  PT G-Codes **NOT FOR INPATIENT CLASS**  Functional Assessment Tool Used Clinical judgement  Functional Limitation Mobility: Walking and moving around  Mobility: Walking and Moving Around Current Status (I3382) CM  Mobility: Walking and Moving Around Goal Status (N0539) CJ  PT General Charges  $$ ACUTE PT VISIT 1 Procedure  PT Evaluation  $PT  Eval  Moderate Complexity 1 Procedure  PT Treatments  $Therapeutic Exercise 8-22 mins    Late-entry g-codes added after review of initial documentation by Greggory Stallion, [place]. Owens Shark, PT, DPT, NCS 02/22/17, 8:57 AM [phone]

## 2017-02-22 NOTE — Progress Notes (Signed)
Late entry for missed G-code. Based on review of the evaluation and goals by this therapist, Kimbria Camposano, MS, CCC-SLP   Erik Burkett, MS, CCC-SLP   

## 2017-02-22 NOTE — Progress Notes (Signed)
   05/18/16 1155  PT Time Calculation  PT Start Time (ACUTE ONLY) 0955  PT Stop Time (ACUTE ONLY) 1023  PT Time Calculation (min) (ACUTE ONLY) 28 min  PT G-Codes **NOT FOR INPATIENT CLASS**  Functional Assessment Tool Used Clinical judgement  Functional Limitation Mobility: Walking and moving around  Mobility: Walking and Moving Around Current Status (I7579) CM  Mobility: Walking and Moving Around Goal Status (J2820) CJ  PT General Charges  $$ ACUTE PT VISIT 1 Procedure  PT Evaluation  $PT Eval Moderate Complexity 1 Procedure  PT Treatments  $Therapeutic Activity 8-22 mins    Late-entry g-codes added after review of initial evaluation/documentation by Cy Blamer, PT.  Reyes Ivan. Owens Shark, PT, DPT, NCS 02/22/17, 8:48 AM [phone]

## 2017-02-28 ENCOUNTER — Inpatient Hospital Stay: Payer: Medicare Other

## 2017-02-28 ENCOUNTER — Inpatient Hospital Stay: Payer: Medicare Other | Attending: Internal Medicine | Admitting: Internal Medicine

## 2017-02-28 VITALS — BP 117/85 | HR 112 | Temp 97.6°F | Resp 20 | Ht 66.0 in | Wt 92.6 lb

## 2017-02-28 DIAGNOSIS — C9002 Multiple myeloma in relapse: Secondary | ICD-10-CM | POA: Insufficient documentation

## 2017-02-28 DIAGNOSIS — G629 Polyneuropathy, unspecified: Secondary | ICD-10-CM | POA: Insufficient documentation

## 2017-02-28 DIAGNOSIS — Z8611 Personal history of tuberculosis: Secondary | ICD-10-CM | POA: Diagnosis not present

## 2017-02-28 DIAGNOSIS — Z79899 Other long term (current) drug therapy: Secondary | ICD-10-CM | POA: Diagnosis not present

## 2017-02-28 DIAGNOSIS — K5900 Constipation, unspecified: Secondary | ICD-10-CM

## 2017-02-28 DIAGNOSIS — D649 Anemia, unspecified: Secondary | ICD-10-CM | POA: Insufficient documentation

## 2017-02-28 DIAGNOSIS — Z8613 Personal history of malaria: Secondary | ICD-10-CM

## 2017-02-28 DIAGNOSIS — E538 Deficiency of other specified B group vitamins: Secondary | ICD-10-CM

## 2017-02-28 LAB — COMPREHENSIVE METABOLIC PANEL
ALT: 13 U/L — ABNORMAL LOW (ref 17–63)
ANION GAP: 6 (ref 5–15)
AST: 21 U/L (ref 15–41)
Albumin: 3.9 g/dL (ref 3.5–5.0)
Alkaline Phosphatase: 60 U/L (ref 38–126)
BILIRUBIN TOTAL: 0.7 mg/dL (ref 0.3–1.2)
BUN: 16 mg/dL (ref 6–20)
CO2: 29 mmol/L (ref 22–32)
Calcium: 8.8 mg/dL — ABNORMAL LOW (ref 8.9–10.3)
Chloride: 103 mmol/L (ref 101–111)
Creatinine, Ser: 0.7 mg/dL (ref 0.61–1.24)
GFR calc non Af Amer: >60 mL/min (ref >60)
Glucose, Bld: 119 mg/dL — ABNORMAL HIGH (ref 65–99)
POTASSIUM: 3.9 mmol/L (ref 3.5–5.1)
Sodium: 138 mmol/L (ref 135–145)
TOTAL PROTEIN: 6.3 g/dL — AB (ref 6.5–8.1)

## 2017-02-28 LAB — CBC WITH DIFFERENTIAL/PLATELET
Basophils Absolute: 0 10*3/uL (ref 0–0.1)
Basophils Relative: 0 %
EOS ABS: 0 10*3/uL (ref 0–0.7)
Eosinophils Relative: 0 %
HCT: 34.3 % — ABNORMAL LOW (ref 40.0–52.0)
Hemoglobin: 11.4 g/dL — ABNORMAL LOW (ref 13.0–18.0)
Lymphocytes Relative: 29 %
Lymphs Abs: 1.4 10*3/uL (ref 1.0–3.6)
MCH: 29.7 pg (ref 26.0–34.0)
MCHC: 33.2 g/dL (ref 32.0–36.0)
MCV: 89.4 fL (ref 80.0–100.0)
MONO ABS: 0.4 10*3/uL (ref 0.2–1.0)
MONOS PCT: 7 %
Neutro Abs: 3 10*3/uL (ref 1.4–6.5)
Neutrophils Relative %: 62 %
PLATELETS: 183 10*3/uL (ref 150–440)
RBC: 3.84 MIL/uL — ABNORMAL LOW (ref 4.40–5.90)
RDW: 14.7 % — AB (ref 11.5–14.5)
WBC: 4.9 10*3/uL (ref 3.8–10.6)

## 2017-02-28 NOTE — Progress Notes (Signed)
[place] NOTE  Patient Care Team: Francisco [name], Francisco Lade, MD as PCP - General (Family Medicine) Francisco Lye, MD (General Surgery) Francisco Green, MD as Referring Physician (Surgery)  Ms. Francisco [name]; [facility]  CHIEF COMPLAINTS/PURPOSE OF CONSULTATION:  Multiple lytic lesions  #  Oncology History   # SEP 2017- Multiple Myeloma lytic lesions- Thoracic spine/ vertebral compression fractures; IgG Kappa- 3.1gm/dl; K/L-140. Vel-Rev-   # FTT s/p PEG [Dr. Elliot];   # Abnormal CXR;? TB no treatment- chronic     Multiple myeloma in relapse (HCC)     HISTORY OF PRESENTING ILLNESS:  Francisco [name] 71 y.o.  male  With Diagnosis of multiple myeloma IgG kappa- multiple bone lesions/anemia- Currently off any treatment/surveillance.   Patient complains of tingling and numbness in his feet; interrupting his daily lifestyle. He denies any falls. He is interested in acupuncture.   He also complains of diffuse body aches. Denies any point tenderness.  His appetite is improving. Eating by himself.  Otherwise denies any chest pain. No nausea no vomiting.   ROS: A complete 10 point review of system is done which is negative except mentioned above in history of present illness  MEDICAL HISTORY:  Past Medical History:  Diagnosis Date  . Anemia   . Cancer (HCC)    Bone metastasis  . Constipation   . Hearing loss   . Hoarse voice quality   . Liver lesion   . Malaria 2003  . Multiple myeloma ([place]) 03/22/2016   Per Dr. Rogue [name] on his PET order.  . Tuberculosis 1985    SURGICAL HISTORY: Past Surgical History:  Procedure Laterality Date  . PEG PLACEMENT N/A 05/18/2016   Procedure: PERCUTANEOUS ENDOSCOPIC GASTROSTOMY (PEG) PLACEMENT;  Surgeon: Francisco Silvas, MD;  Location: [facility] ENDOSCOPY;  Service: Endoscopy;  Laterality: N/A;  . 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 No smoking or alcohol.  Social History   Social History  . Marital status: Married    Spouse name: N/A  . Number of children: N/A  . Years of education: N/A   Occupational History  . Not on file.   Social History Main Topics  . Smoking status: Never Smoker  . Smokeless tobacco: Never Used  . Alcohol use No  . Drug use: No  . Sexual activity: Not on file   Other Topics Concern  . Not on file   Social History Narrative  . No narrative on file    FAMILY HISTORY: no history of cancers in the family. Family History  Problem Relation Age of Onset  . Hypertension Mother     ALLERGIES:  has No Known Allergies.  MEDICATIONS:  Current Outpatient Prescriptions  Medication Sig Dispense Refill  . cyanocobalamin 1000 MCG tablet Take 1,000 mcg by mouth daily.    . Cholecalciferol (PA VITAMIN D-3) 1000 units tablet Take 1 tablet (1,000 Units total) by mouth daily. 30 tablet 6   No current facility-administered medications for this visit.       Marland Kitchen  PHYSICAL EXAMINATION: ECOG PERFORMANCE STATUS: 3 - Symptomatic, >50% confined to bed  Vitals:   02/28/17 1155  BP: 117/85  Pulse: (!) 112  Resp: 20  Temp: 97.6 F (36.4 C)   Filed Weights   02/28/17 1155  Weight: 92 lb 9.5 oz (42 kg)    GENERAL: moderately nourished; moderately built; male patient; Alert, no distress and comfortable.   With family.  In a wheelchair.  EYES: no pallor or icterus OROPHARYNX: no thrush ulceration. NECK: supple, no masses felt LYMPH:  no palpable lymphadenopathy in the cervical, axillary or inguinal regions LUNGS: clear to auscultation and  No wheeze or crackles HEART/CVS: regular rate & rhythm and no murmurs; No lower extremity edema ABDOMEN: abdomen soft, non-tender and normal bowel sounds;PEG tube explanted.  Musculoskeletal:no cyanosis of digits and no clubbing  PSYCH: alert & oriented x 3 with fluent speech NEURO: no focal motor/sensory deficits SKIN:  no rashes or  significant lesions  LABORATORY DATA:  I have reviewed the data as listed Lab Results  Component Value Date   WBC 4.9 02/28/2017   HGB 11.4 (L) 02/28/2017   HCT 34.3 (L) 02/28/2017   MCV 89.4 02/28/2017   PLT 183 02/28/2017    Recent Labs  06/28/16 0822 07/12/16 1405 07/19/16 1413 02/28/17 1125  NA 132* 132* 134* 138  K 3.5 4.3 3.9 3.9  CL 97* 99* 100* 103  CO2 28 26 28 29   GLUCOSE 107* 108* 97 119*  BUN 15 18 21* 16  CREATININE 0.50* 0.59* 0.57* 0.70  CALCIUM 8.7* 8.6* 9.0 8.8*  GFRNONAA >60 >60 >60 >60  GFRAA >60 >60 >60 >60  PROT 6.6 6.9  --  6.3*  ALBUMIN 3.0* 3.3*  --  3.9  AST 18 24  --  21  ALT 18 24  --  13*  ALKPHOS 86 96  --  60  BILITOT 0.5 0.4  --  0.7    RADIOGRAPHIC STUDIES: I have personally reviewed the radiological images as listed and agreed with the findings in the report. No results found.  Results for Francisco, [name] (MRN [id number]) as of 07/19/2016 15:04  Ref. Range 03/16/2016 14:59 04/27/2016 18:53 06/07/2016 13:47 06/28/2016 08:22  Kappa free light chain Latest Ref Range: 3.3 - 19.4 mg/L 1,005.1 (H) 44.4 (H) 136.9 (H) 142.7 (H)  Lamda free light chains Latest Ref Range: 5.7 - 26.3 mg/L 7.1 5.4 (L) 23.4 24.4  Kappa, lamda light chain ratio Latest Ref Range: 0.26 - 1.65  141.56 (H) 8.22 (H) 5.85 (H) 5.85 (H)  M Protein SerPl Elph-Mcnc Latest Ref Range: Not Observed g/dL 3.1 (H) 0.8 (H) 0.7 (H)       ASSESSMENT & PLAN:   Multiple myeloma in relapse (HCC) Multiple myeloma M protein- 3.1 g/dL; kappa/lambda ratio 140; multiple bone lesions. Status post RVD [poor tolerance]; status post pom-Dex [stopped in June 2018]- secondary to poor tolerance. Currently on surveillance. Patient doing well except for-moderate to severe neuropathy/myalgias.  # Most recent myeloma protein 01/19/2017 [[place]]- 0.5; kappa light chains 52; ratio 0.5. Hemoglobin around 10; normal kidney; calcium 8.2. CBC today- normal except for mildly low hemoglobin at 11.  Chemistries normal except for mildly low calcium 8.3. Myeloma panel from today pending.  # Continue surveillance at this time; we will recheck the myeloma protein labs in approximately 1 month. Patient not too keen on starting treatment.   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 Acupuncture is a reasonable option.  # B12 deficiency-most recent checked 580; recommend by mouth B12 at home.  # Multiple bone lesions - currently on zometa every 3 months. Last July 12th; again planning October 2018.   # follow up in 4 weeks/labs/ myeloma panel.  All questions were answered. The patient knows to call the clinic with any problems, questions or concerns.    Cammie Sickle, MD 02/28/2017 1:06 PM

## 2017-02-28 NOTE — Assessment & Plan Note (Addendum)
Multiple myeloma M protein- 3.1 g/dL; kappa/lambda ratio 140; multiple bone lesions. Status post RVD [poor tolerance]; status post pom-Dex [stopped in June 2018]- secondary to poor tolerance. Currently on surveillance. Patient doing well except for-moderate to severe neuropathy/myalgias.  # Most recent myeloma protein 01/19/2017 [[place]]- 0.5; kappa light chains 52; ratio 0.5. Hemoglobin around 10; normal kidney; calcium 8.2. CBC today- normal except for mildly low hemoglobin at 11. Chemistries normal except for mildly low calcium 8.3. Myeloma panel from today pending.  # Continue surveillance at this time; we will recheck the myeloma protein labs in approximately 1 month. Patient not too keen on starting treatment.   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 Acupuncture is a reasonable option.  # B12 deficiency-most recent checked 580; recommend by mouth B12 at home.  # Multiple bone lesions - currently on zometa every 3 months. Last July 12th; again planning October 2018.   # follow up in 4 weeks/labs/ myeloma panel.

## 2017-03-01 LAB — MULTIPLE MYELOMA PANEL, SERUM
ALBUMIN SERPL ELPH-MCNC: 3.7 g/dL (ref 2.9–4.4)
Albumin/Glob SerPl: 1.4 (ref 0.7–1.7)
Alpha 1: 0.2 g/dL (ref 0.0–0.4)
Alpha2 Glob SerPl Elph-Mcnc: 0.5 g/dL (ref 0.4–1.0)
B-Globulin SerPl Elph-Mcnc: 0.9 g/dL (ref 0.7–1.3)
GAMMA GLOB SERPL ELPH-MCNC: 1.2 g/dL (ref 0.4–1.8)
GLOBULIN, TOTAL: 2.8 g/dL (ref 2.2–3.9)
IGA: 94 mg/dL (ref 61–437)
IGM (IMMUNOGLOBULIN M), SRM: 12 mg/dL — AB (ref 15–143)
IgG (Immunoglobin G), Serum: 1303 mg/dL (ref 700–1600)
M Protein SerPl Elph-Mcnc: 0.2 g/dL — ABNORMAL HIGH
Total Protein ELP: 6.5 g/dL (ref 6.0–8.5)

## 2017-03-01 LAB — KAPPA/LAMBDA LIGHT CHAINS
KAPPA FREE LGHT CHN: 56.9 mg/L — AB (ref 3.3–19.4)
Kappa, lambda light chain ratio: 5.42 — ABNORMAL HIGH (ref 0.26–1.65)
LAMDA FREE LIGHT CHAINS: 10.5 mg/L (ref 5.7–26.3)

## 2017-03-28 ENCOUNTER — Inpatient Hospital Stay: Payer: Medicare Other | Attending: Internal Medicine

## 2017-03-28 ENCOUNTER — Inpatient Hospital Stay (HOSPITAL_BASED_OUTPATIENT_CLINIC_OR_DEPARTMENT_OTHER): Payer: Medicare Other | Admitting: Internal Medicine

## 2017-03-28 VITALS — BP 118/80 | HR 108 | Temp 97.8°F | Resp 20 | Ht 66.0 in | Wt 97.0 lb

## 2017-03-28 DIAGNOSIS — Z923 Personal history of irradiation: Secondary | ICD-10-CM | POA: Diagnosis not present

## 2017-03-28 DIAGNOSIS — Z79899 Other long term (current) drug therapy: Secondary | ICD-10-CM | POA: Diagnosis not present

## 2017-03-28 DIAGNOSIS — A159 Respiratory tuberculosis unspecified: Secondary | ICD-10-CM

## 2017-03-28 DIAGNOSIS — G629 Polyneuropathy, unspecified: Secondary | ICD-10-CM | POA: Diagnosis not present

## 2017-03-28 DIAGNOSIS — C9002 Multiple myeloma in relapse: Secondary | ICD-10-CM | POA: Diagnosis present

## 2017-03-28 DIAGNOSIS — Z931 Gastrostomy status: Secondary | ICD-10-CM

## 2017-03-28 DIAGNOSIS — R202 Paresthesia of skin: Secondary | ICD-10-CM | POA: Diagnosis not present

## 2017-03-28 DIAGNOSIS — R627 Adult failure to thrive: Secondary | ICD-10-CM | POA: Insufficient documentation

## 2017-03-28 DIAGNOSIS — R200 Anesthesia of skin: Secondary | ICD-10-CM | POA: Diagnosis not present

## 2017-03-28 DIAGNOSIS — D649 Anemia, unspecified: Secondary | ICD-10-CM

## 2017-03-28 DIAGNOSIS — Z8619 Personal history of other infectious and parasitic diseases: Secondary | ICD-10-CM | POA: Diagnosis not present

## 2017-03-28 DIAGNOSIS — E538 Deficiency of other specified B group vitamins: Secondary | ICD-10-CM | POA: Diagnosis not present

## 2017-03-28 LAB — CBC WITH DIFFERENTIAL/PLATELET
Basophils Absolute: 0 10*3/uL (ref 0–0.1)
Basophils Relative: 1 %
Eosinophils Absolute: 0 10*3/uL (ref 0–0.7)
Eosinophils Relative: 1 %
HCT: 31.4 % — ABNORMAL LOW (ref 40.0–52.0)
Hemoglobin: 10.6 g/dL — ABNORMAL LOW (ref 13.0–18.0)
Lymphocytes Relative: 34 %
Lymphs Abs: 1.3 10*3/uL (ref 1.0–3.6)
MCH: 30.2 pg (ref 26.0–34.0)
MCHC: 33.9 g/dL (ref 32.0–36.0)
MCV: 89.1 fL (ref 80.0–100.0)
Monocytes Absolute: 0.4 10*3/uL (ref 0.2–1.0)
Monocytes Relative: 10 %
Neutro Abs: 2.2 10*3/uL (ref 1.4–6.5)
Neutrophils Relative %: 54 %
Platelets: 158 10*3/uL (ref 150–440)
RBC: 3.52 MIL/uL — ABNORMAL LOW (ref 4.40–5.90)
RDW: 14 % (ref 11.5–14.5)
WBC: 4 10*3/uL (ref 3.8–10.6)

## 2017-03-28 LAB — COMPREHENSIVE METABOLIC PANEL
ALT: 11 U/L — ABNORMAL LOW (ref 17–63)
AST: 20 U/L (ref 15–41)
Albumin: 3.5 g/dL (ref 3.5–5.0)
Alkaline Phosphatase: 53 U/L (ref 38–126)
Anion gap: 6 (ref 5–15)
BILIRUBIN TOTAL: 0.6 mg/dL (ref 0.3–1.2)
BUN: 16 mg/dL (ref 6–20)
CHLORIDE: 104 mmol/L (ref 101–111)
CO2: 28 mmol/L (ref 22–32)
Calcium: 8.4 mg/dL — ABNORMAL LOW (ref 8.9–10.3)
Creatinine, Ser: 0.63 mg/dL (ref 0.61–1.24)
GFR calc Af Amer: >60 mL/min (ref >60)
GFR calc non Af Amer: >60 mL/min (ref >60)
GLUCOSE: 137 mg/dL — AB (ref 65–99)
POTASSIUM: 3.6 mmol/L (ref 3.5–5.1)
Sodium: 138 mmol/L (ref 135–145)
Total Protein: 5.8 g/dL — ABNORMAL LOW (ref 6.5–8.1)

## 2017-03-28 NOTE — Progress Notes (Signed)
Francisco [name] NOTE  Patient Care Team: Zhou-Talbert, Elwyn Lade, MD as PCP - General (Family Medicine) Francisco Lye, MD (General Surgery) Leonie Green, MD as Referring Physician (Surgery)  Francisco [name]; [facility]  CHIEF COMPLAINTS/PURPOSE OF CONSULTATION:  Multiple lytic lesions  #  Oncology History   # SEP 2017- Multiple Myeloma lytic lesions- Thoracic spine/ vertebral compression fractures; IgG Kappa- 3.1gm/dl; K/L-140. Vel-Rev- VGPR; transferred care to [place].; Stopped Pom-Dex-July 2018 sec to intol/PN; on surviellance.   # AUg 2018- back in [place]  # s/p RT to Thoracic spine [Dr.Crystal; Oct 2018]  # FTT s/p PEG [Dr. Elliot];   # Abnormal CXR;? TB no treatment- chronic     Multiple myeloma in relapse (HCC)     HISTORY OF PRESENTING ILLNESS:  Francisco [name] 71 y.o.  male  With Diagnosis of multiple myeloma IgG kappa- multiple bone lesions/anemia- Currently off any treatment/surveillance.   Patient complains of tingling and numbness in his feet; interrupting his daily lifestyle. He denies any falls. Patient goes around with a rolling walker. He tried acupuncture- however has intolerance to it. Stopped.  Given his ongoing body aches he was recommended vitamin D and calcium- he has not started yet.  His appetite is improving. Eating by himself.  Otherwise denies any chest pain. No nausea no vomiting.   ROS: A complete 10 point review of system is done which is negative except mentioned above in history of present illness  MEDICAL HISTORY:  Past Medical History:  Diagnosis Date  . Anemia   . Cancer (HCC)    Bone metastasis  . Constipation   . Hearing loss   . Hoarse voice quality   . Liver lesion   . Malaria 2003  . Multiple myeloma ([place]) 03/22/2016   Per Dr. Rogue Bussing on his PET order.  . Tuberculosis 1985    SURGICAL HISTORY: Past Surgical History:  Procedure Laterality Date  . PEG PLACEMENT N/A 05/18/2016   Procedure: PERCUTANEOUS ENDOSCOPIC GASTROSTOMY (PEG) PLACEMENT;  Surgeon: Manya Silvas, MD;  Location: [facility] ENDOSCOPY;  Service: Endoscopy;  Laterality: N/A;  . 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 No smoking or alcohol.  Social History   Social History  . Marital status: Married    Spouse name: N/A  . Number of children: N/A  . Years of education: N/A   Occupational History  . Not on file.   Social History Main Topics  . Smoking status: Never Smoker  . Smokeless tobacco: Never Used  . Alcohol use No  . Drug use: No  . Sexual activity: Not on file   Other Topics Concern  . Not on file   Social History Narrative  . No narrative on file    FAMILY HISTORY: no history of cancers in the family. Family History  Problem Relation Age of Onset  . Hypertension Mother     ALLERGIES:  has No Known Allergies.  MEDICATIONS:  Current Outpatient Prescriptions  Medication Sig Dispense Refill  . Cholecalciferol (PA VITAMIN D-3) 1000 units tablet Take 1 tablet (1,000 Units total) by mouth daily. 30 tablet 6  . cyanocobalamin 1000 MCG tablet Take 1,000 mcg by mouth daily.     No current facility-administered medications for this visit.       Marland Kitchen  PHYSICAL EXAMINATION: ECOG PERFORMANCE STATUS: 3 - Symptomatic, >50% confined to bed  Vitals:   03/28/17 1405  BP: 118/80  Pulse: Marland Kitchen)  108  Resp: 20  Temp: 97.8 F (36.6 C)   Filed Weights   03/28/17 1405  Weight: 97 lb (44 kg)    GENERAL: moderately nourished; moderately built; male patient; Alert, no distress and comfortable.   With family. In a wheelchair.  EYES: no pallor or icterus OROPHARYNX: no thrush ulceration. NECK: supple, no masses felt LYMPH:  no palpable lymphadenopathy in the cervical, axillary or inguinal regions LUNGS: clear to auscultation and  No wheeze or crackles HEART/CVS: regular rate & rhythm and no murmurs; No  lower extremity edema ABDOMEN: abdomen soft, non-tender and normal bowel sounds;PEG tube explanted.  Musculoskeletal:no cyanosis of digits and no clubbing  PSYCH: alert & oriented x 3 with fluent speech NEURO: no focal motor/sensory deficits SKIN:  no rashes or significant lesions  LABORATORY DATA:  I have reviewed the data as listed Lab Results  Component Value Date   WBC 4.0 03/28/2017   HGB 10.6 (L) 03/28/2017   HCT 31.4 (L) 03/28/2017   MCV 89.1 03/28/2017   PLT 158 03/28/2017    Recent Labs  07/12/16 1405 07/19/16 1413 02/28/17 1125 03/28/17 1339  NA 132* 134* 138 138  K 4.3 3.9 3.9 3.6  CL 99* 100* 103 104  CO2 _0 GLUCOSE 108* 97 119* 137*  BUN 18 21* 16 16  CREATININE 0.59* 0.57* 0.70 0.63  CALCIUM 8.6* 9.0 8.8* 8.4*  GFRNONAA >60 >60 >60 >60  GFRAA >60 >60 >60 >60  PROT 6.9  --  6.3* 5.8*  ALBUMIN 3.3*  --  3.9 3.5  AST 24  --  21 20  ALT 24  --  13* 11*  ALKPHOS 96  --  60 53  BILITOT 0.4  --  0.7 0.6    RADIOGRAPHIC STUDIES: I have personally reviewed the radiological images as listed and agreed with the findings in the report. No results found.  Results for Francisco [name] (MRN [id number]) as of 07/19/2016 15:04  Ref. Range 03/16/2016 14:59 04/27/2016 18:53 06/07/2016 13:47 06/28/2016 08:22  Kappa free light chain Latest Ref Range: 3.3 - 19.4 mg/L 1,005.1 (H) 44.4 (H) 136.9 (H) 142.7 (H)  Lamda free light chains Latest Ref Range: 5.7 - 26.3 mg/L 7.1 5.4 (L) 23.4 24.4  Kappa, lamda light chain ratio Latest Ref Range: 0.26 - 1.65  141.56 (H) 8.22 (H) 5.85 (H) 5.85 (H)  M Protein SerPl Elph-Mcnc Latest Ref Range: Not Observed g/dL 3.1 (H) 0.8 (H) 0.7 (H)       ASSESSMENT & PLAN:   Multiple myeloma in relapse (HCC) Multiple myeloma M protein- 3.1 g/dL; kappa/lambda ratio 140; multiple bone lesions [sep 2017]. Status post RVD [poor tolerance]; status post pom-Dex [stopped in June 2018]- secondary to poor tolerance.  # Currently on  surveillance. Patient doing well except for-moderate to severe neuropathy/myalgias.  # Most recent myeloma workup is stable. [ M- protein 01/19/2017 [[place]]- 0.5; kappa light chains 52; ratio 0.5; August 2018- M protein 0.2; kappa light chains 58; normal renal/calcium; hemoglobin 10-11; platelets normal]  # Continue surveillance at this time; Patient not too keen on starting treatment.    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 Acupuncture not tolerable; no benefit..   # B12 deficiency-most recent checked 580; recommend by mouth B12 at home.  # Hypocalcemia- 8.4; recommend taking calcium and vitamin D at home.  # Multiple bone lesions - currently on zometa every 3 months. Last July 12th; we will plan in October 2018.   # follow up in  6 weeks/labs/ myeloma panel/Zometa.   All questions were answered. The patient knows to call the clinic with any problems, questions or concerns.    Cammie Sickle, MD 03/28/2017 2:58 PM

## 2017-03-28 NOTE — Assessment & Plan Note (Addendum)
Multiple myeloma M protein- 3.1 g/dL; kappa/lambda ratio 140; multiple bone lesions [sep 2017]. Status post RVD [poor tolerance]; status post pom-Dex [stopped in June 2018]- secondary to poor tolerance.  # Currently on surveillance. Patient doing well except for-moderate to severe neuropathy/myalgias.  # Most recent myeloma workup is stable. [ M- protein 01/19/2017 [[place]]- 0.5; kappa light chains 52; ratio 0.5; August 2018- M protein 0.2; kappa light chains 58; normal renal/calcium; hemoglobin 10-11; platelets normal]  # Continue surveillance at this time; Patient not too keen on starting treatment.    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 Acupuncture not tolerable; no benefit..   # B12 deficiency-most recent checked 580; recommend by mouth B12 at home.  # Hypocalcemia- 8.4; recommend taking calcium and vitamin D at home.  # Multiple bone lesions - currently on zometa every 3 months. Last July 12th; we will plan in October 2018.   # follow up in 6 weeks/labs/ myeloma panel/Zometa.

## 2017-03-29 LAB — KAPPA/LAMBDA LIGHT CHAINS
KAPPA FREE LGHT CHN: 56.3 mg/L — AB (ref 3.3–19.4)
Kappa, lambda light chain ratio: 6.26 — ABNORMAL HIGH (ref 0.26–1.65)
LAMDA FREE LIGHT CHAINS: 9 mg/L (ref 5.7–26.3)

## 2017-03-29 LAB — MULTIPLE MYELOMA PANEL, SERUM
ALBUMIN SERPL ELPH-MCNC: 3.3 g/dL (ref 2.9–4.4)
ALPHA 1: 0.1 g/dL (ref 0.0–0.4)
ALPHA2 GLOB SERPL ELPH-MCNC: 0.5 g/dL (ref 0.4–1.0)
Albumin/Glob SerPl: 1.3 (ref 0.7–1.7)
B-Globulin SerPl Elph-Mcnc: 0.8 g/dL (ref 0.7–1.3)
GLOBULIN, TOTAL: 2.6 g/dL (ref 2.2–3.9)
Gamma Glob SerPl Elph-Mcnc: 1.1 g/dL (ref 0.4–1.8)
IGG (IMMUNOGLOBIN G), SERUM: 1190 mg/dL (ref 700–1600)
IgA: 73 mg/dL (ref 61–437)
IgM (Immunoglobulin M), Srm: 11 mg/dL — ABNORMAL LOW (ref 15–143)
M Protein SerPl Elph-Mcnc: 0.4 g/dL — ABNORMAL HIGH
Total Protein ELP: 5.9 g/dL — ABNORMAL LOW (ref 6.0–8.5)

## 2017-04-14 ENCOUNTER — Telehealth (HOSPITAL_BASED_OUTPATIENT_CLINIC_OR_DEPARTMENT_OTHER): Payer: Self-pay

## 2017-04-14 ENCOUNTER — Ambulatory Visit (HOSPITAL_BASED_OUTPATIENT_CLINIC_OR_DEPARTMENT_OTHER): Admit: 2017-04-14 | Discharge: 2017-04-14 | Disposition: A | Discharge status: Home Health | Payer: Medicare Other

## 2017-04-14 ENCOUNTER — Ambulatory Visit
Admit: 2017-04-14 | Discharge: 2017-04-14 | Disposition: A | Discharge status: Home / Self Care | Payer: Medicare Other | Attending: Hematology & Oncology | Admitting: Hematology & Oncology

## 2017-04-14 NOTE — Telephone Encounter (Signed)
Patient did not show for today's Infusion center appointment on 04/14/17 for zometa . MD and RN notified. Patient's son states he will call to reschedule.

## 2017-05-08 ENCOUNTER — Other Ambulatory Visit: Payer: Self-pay | Admitting: Internal Medicine

## 2017-05-08 DIAGNOSIS — C9002 Multiple myeloma in relapse: Secondary | ICD-10-CM

## 2017-05-09 ENCOUNTER — Inpatient Hospital Stay: Payer: Medicare Other

## 2017-05-09 ENCOUNTER — Ambulatory Visit: Payer: Medicare Other | Admitting: Internal Medicine

## 2017-05-09 ENCOUNTER — Inpatient Hospital Stay: Payer: Medicare Other | Attending: Internal Medicine

## 2017-05-09 ENCOUNTER — Other Ambulatory Visit: Payer: Medicare Other

## 2017-05-09 ENCOUNTER — Inpatient Hospital Stay: Payer: Medicare Other | Admitting: Internal Medicine

## 2017-05-09 ENCOUNTER — Ambulatory Visit: Payer: Medicare Other

## 2017-05-09 DIAGNOSIS — C9000 Multiple myeloma not having achieved remission: Secondary | ICD-10-CM | POA: Diagnosis present

## 2017-05-09 DIAGNOSIS — C9002 Multiple myeloma in relapse: Secondary | ICD-10-CM

## 2017-05-09 LAB — CBC WITH DIFFERENTIAL/PLATELET
Basophils Absolute: 0 10*3/uL (ref 0–0.1)
Basophils Relative: 0 %
EOS PCT: 1 %
Eosinophils Absolute: 0 10*3/uL (ref 0–0.7)
HEMATOCRIT: 32.1 % — AB (ref 40.0–52.0)
Hemoglobin: 10.8 g/dL — ABNORMAL LOW (ref 13.0–18.0)
LYMPHS ABS: 1.6 10*3/uL (ref 1.0–3.6)
LYMPHS PCT: 37 %
MCH: 29.5 pg (ref 26.0–34.0)
MCHC: 33.6 g/dL (ref 32.0–36.0)
MCV: 87.8 fL (ref 80.0–100.0)
MONO ABS: 0.4 10*3/uL (ref 0.2–1.0)
MONOS PCT: 9 %
NEUTROS ABS: 2.3 10*3/uL (ref 1.4–6.5)
Neutrophils Relative %: 53 %
Platelets: 172 10*3/uL (ref 150–440)
RBC: 3.66 MIL/uL — ABNORMAL LOW (ref 4.40–5.90)
RDW: 14 % (ref 11.5–14.5)
WBC: 4.3 10*3/uL (ref 3.8–10.6)

## 2017-05-09 LAB — COMPREHENSIVE METABOLIC PANEL
ALT: 11 U/L — ABNORMAL LOW (ref 17–63)
ANION GAP: 4 — AB (ref 5–15)
AST: 20 U/L (ref 15–41)
Albumin: 3.6 g/dL (ref 3.5–5.0)
Alkaline Phosphatase: 59 U/L (ref 38–126)
BILIRUBIN TOTAL: 0.4 mg/dL (ref 0.3–1.2)
BUN: 24 mg/dL — ABNORMAL HIGH (ref 6–20)
CALCIUM: 8.5 mg/dL — AB (ref 8.9–10.3)
CO2: 29 mmol/L (ref 22–32)
Chloride: 103 mmol/L (ref 101–111)
Creatinine, Ser: 0.81 mg/dL (ref 0.61–1.24)
GFR calc non Af Amer: >60 mL/min (ref >60)
Glucose, Bld: 144 mg/dL — ABNORMAL HIGH (ref 65–99)
Potassium: 4.1 mmol/L (ref 3.5–5.1)
Sodium: 136 mmol/L (ref 135–145)
TOTAL PROTEIN: 6.1 g/dL — AB (ref 6.5–8.1)

## 2017-05-10 LAB — MULTIPLE MYELOMA PANEL, SERUM
ALBUMIN/GLOB SERPL: 1.4 (ref 0.7–1.7)
ALPHA 1: 0.2 g/dL (ref 0.0–0.4)
ALPHA2 GLOB SERPL ELPH-MCNC: 0.5 g/dL (ref 0.4–1.0)
Albumin SerPl Elph-Mcnc: 3.5 g/dL (ref 2.9–4.4)
B-Globulin SerPl Elph-Mcnc: 0.8 g/dL (ref 0.7–1.3)
GAMMA GLOB SERPL ELPH-MCNC: 1.1 g/dL (ref 0.4–1.8)
GLOBULIN, TOTAL: 2.6 g/dL (ref 2.2–3.9)
IGG (IMMUNOGLOBIN G), SERUM: 1302 mg/dL (ref 700–1600)
IgA: 81 mg/dL (ref 61–437)
IgM (Immunoglobulin M), Srm: 12 mg/dL — ABNORMAL LOW (ref 15–143)
M PROTEIN SERPL ELPH-MCNC: 0.4 g/dL — AB
Total Protein ELP: 6.1 g/dL (ref 6.0–8.5)

## 2017-05-10 LAB — KAPPA/LAMBDA LIGHT CHAINS
KAPPA FREE LGHT CHN: 78.7 mg/L — AB (ref 3.3–19.4)
Kappa, lambda light chain ratio: 6.35 — ABNORMAL HIGH (ref 0.26–1.65)
Lambda free light chains: 12.4 mg/L (ref 5.7–26.3)

## 2017-05-15 ENCOUNTER — Other Ambulatory Visit: Payer: Self-pay | Admitting: Internal Medicine

## 2017-05-15 DIAGNOSIS — C9002 Multiple myeloma in relapse: Secondary | ICD-10-CM

## 2017-05-16 ENCOUNTER — Inpatient Hospital Stay: Payer: Medicare Other | Attending: Internal Medicine | Admitting: Internal Medicine

## 2017-05-16 ENCOUNTER — Inpatient Hospital Stay: Payer: Medicare Other

## 2017-05-16 VITALS — BP 112/81 | HR 118 | Temp 96.9°F | Resp 18 | Wt 100.9 lb

## 2017-05-16 DIAGNOSIS — Z8611 Personal history of tuberculosis: Secondary | ICD-10-CM

## 2017-05-16 DIAGNOSIS — K769 Liver disease, unspecified: Secondary | ICD-10-CM | POA: Diagnosis not present

## 2017-05-16 DIAGNOSIS — Z79899 Other long term (current) drug therapy: Secondary | ICD-10-CM | POA: Diagnosis not present

## 2017-05-16 DIAGNOSIS — R5383 Other fatigue: Secondary | ICD-10-CM | POA: Insufficient documentation

## 2017-05-16 DIAGNOSIS — E538 Deficiency of other specified B group vitamins: Secondary | ICD-10-CM | POA: Insufficient documentation

## 2017-05-16 DIAGNOSIS — G629 Polyneuropathy, unspecified: Secondary | ICD-10-CM | POA: Diagnosis not present

## 2017-05-16 DIAGNOSIS — R0981 Nasal congestion: Secondary | ICD-10-CM | POA: Insufficient documentation

## 2017-05-16 DIAGNOSIS — C9002 Multiple myeloma in relapse: Secondary | ICD-10-CM | POA: Diagnosis not present

## 2017-05-16 DIAGNOSIS — R509 Fever, unspecified: Secondary | ICD-10-CM | POA: Diagnosis not present

## 2017-05-16 DIAGNOSIS — R000 Tachycardia, unspecified: Secondary | ICD-10-CM | POA: Insufficient documentation

## 2017-05-16 DIAGNOSIS — K5900 Constipation, unspecified: Secondary | ICD-10-CM | POA: Insufficient documentation

## 2017-05-16 DIAGNOSIS — Z8613 Personal history of malaria: Secondary | ICD-10-CM

## 2017-05-16 MED ORDER — CARVEDILOL 3.125 MG PO TABS: 3.1250 mg | ORAL_TABLET | Freq: Two times a day (BID) | ORAL | 3 refills | Status: DC

## 2017-05-16 NOTE — Assessment & Plan Note (Addendum)
Multiple myeloma M protein- 3.1 g/dL; kappa/lambda ratio 140; multiple bone lesions [sep 2017]. Status post RVD [poor tolerance]; status post Pom-Dex [stopped in June 2018]- secondary to poor tolerance.  # Most recent myeloma workup- biochemical recurrence. [ M- protein 01/19/2017 [[place]]- 0.5; kappa light chains 52; ratio 0.5; OCT 2018- M protein 0.4; kappa light chains 63; normal renal/calcium; hemoglobin-11; platelets normal]  # Continue surveillance at this time; Patient not too keen on starting treatment.    # "Low-grade fevers "- states that he had similar episodes when he was first diagnosed with multiple myeloma. Recommend keeping a log of his temperature; and bringing up to the next visit. If he continues to get symptomatic/ also M protein rises- will consider restarting myeloma treatment- options being Pom/Len vs Ninlaro.   # sinus tachycardia- 110-120s- recommend adding coreg 3.125 BID. Discussed regarding low blood pressures; educated regarding   # Peripheral neuropathy grade 1-2 /multiple myeloma-Velcade.  # B12 deficiency-most recent checked 580; recommend by mouth B12 at home.  # Hypocalcemia- 8.5; recommend taking calcium and vitamin D at home.  # Multiple bone lesions - currently on zometa every 3 months. Last July 12th; hold today because of mildly low calcium. Recommend taking calcium and vitamin D. We'll plan again next visit.   # follow up in 6 weeks/labs/ myeloma panel/Zometa- labs- 1 week prior.

## 2017-05-16 NOTE — Patient Instructions (Signed)
Take ca+vit D [1000 IU] a day

## 2017-05-16 NOTE — Progress Notes (Signed)
Here for follow up . Pt w support person and stated " doing well "

## 2017-05-16 NOTE — Progress Notes (Signed)
[place] NOTE  Patient Care Team: Zhou-Talbert, Elwyn Lade, MD as PCP - General (Family Medicine) Christene Lye, MD (General Surgery) Leonie Green, MD as Referring Physician (Surgery)  Ms. Francisco [name]; [facility]  CHIEF COMPLAINTS/PURPOSE OF CONSULTATION:  Multiple lytic lesions  #  Oncology History   # SEP 2017- Multiple Myeloma lytic lesions- Thoracic spine/ vertebral compression fractures; IgG Kappa- 3.1gm/dl; K/L-140. Vel-Rev- VGPR; transferred care to [place].; Stopped Pom-Dex-July 2018 sec to intol/PN; on surviellance.   # AUg 2018- back in [place]  # s/p RT to Thoracic spine [Dr.Crystal; Oct 2018]  # FTT s/p PEG [Dr. Elliot];   # Abnormal CXR;? TB no treatment- chronic     Multiple myeloma in relapse (HCC)     HISTORY OF PRESENTING ILLNESS:  Francisco [name] 72 y.o.  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    Patient states is tingling and numbness in his feet is improving. He is able to go around with a walker.  Complains of cold intolerance.  He notes to have "low-grade temperatures"- intermittently. He states he had similar episodes when he was first diagnosed with multiple myeloma. However the results spontaneously. No night sweats. He is gaining weight. Appetite is improving. Not having any cough. Patient is not taking calcium and vitamin D as recommended; he just started recently. He denies any bone pain.   ROS: A complete 10 point review of system is done which is negative except mentioned above in history of present illness  MEDICAL HISTORY:  Past Medical History:  Diagnosis Date  . Anemia   . Cancer (HCC)    Bone metastasis  . Constipation   . Hearing loss   . Hoarse voice quality   . Liver lesion   . Malaria 2003  . Multiple myeloma ([place]) 03/22/2016   Per Dr. Rogue Bussing on his PET order.  . Tuberculosis 1985    SURGICAL HISTORY: Past  Surgical History:  Procedure Laterality Date  . 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 No smoking or alcohol.  Social History   Socioeconomic History  . Marital status: Married    Spouse name: Not on file  . Number of children: Not on file  . Years of education: Not on file  . Highest education level: Not on file  Social Needs  . Financial resource strain: Not on file  . Food insecurity - worry: Not on file  . Food insecurity - inability: Not on file  . Transportation needs - medical: Not on file  . Transportation needs - non-medical: Not on file  Occupational History  . Not on file  Tobacco Use  . Smoking status: Never Smoker  . Smokeless tobacco: Never Used  Substance and Sexual Activity  . Alcohol use: No  . Drug use: No  . Sexual activity: Not on file  Other Topics Concern  . Not on file  Social History Narrative  . Not on file    FAMILY HISTORY: no history of cancers in the family. Family History  Problem Relation Age of Onset  . Hypertension Mother     ALLERGIES:  has No Known Allergies.  MEDICATIONS:  Current Outpatient Medications  Medication Sig Dispense Refill  . Cholecalciferol (PA VITAMIN D-3) 1000 units tablet Take 1 tablet (1,000 Units total) by mouth daily. 30 tablet 6  . cyanocobalamin 1000 MCG tablet Take 1,000 mcg by mouth  daily.    . carvedilol (COREG) 3.125 MG tablet Take 1 tablet (3.125 mg total) 2 (two) times daily with a meal by mouth. 60 tablet 3   No current facility-administered medications for this visit.       Marland Kitchen  PHYSICAL EXAMINATION: ECOG PERFORMANCE STATUS: 3 - Symptomatic, >50% confined to bed  Vitals:   05/16/17 1153  BP: 112/81  Pulse: (!) 118  Resp: 18  Temp: (!) 96.9 F (36.1 C)   Filed Weights   05/16/17 1153  Weight: 100 lb 13.8 oz (45.8 kg)    GENERAL: moderately nourished; moderately built; male patient; Alert, no distress  and comfortable.   With family. Walking with a rolling walker. EYES: no pallor or icterus OROPHARYNX: no thrush ulceration. NECK: supple, no masses felt LYMPH:  no palpable lymphadenopathy in the cervical, axillary or inguinal regions LUNGS: clear to auscultation and  No wheeze or crackles HEART/CVS: regular rate & rhythm and no murmurs; No lower extremity edema ABDOMEN: abdomen soft, non-tender and normal bowel sounds.  Musculoskeletal:no cyanosis of digits and no clubbing  PSYCH: alert & oriented x 3 with fluent speech NEURO: no focal motor/sensory deficits SKIN:  no rashes or significant lesions  LABORATORY DATA:  I have reviewed the data as listed Lab Results  Component Value Date   WBC 4.3 05/09/2017   HGB 10.8 (L) 05/09/2017   HCT 32.1 (L) 05/09/2017   MCV 87.8 05/09/2017   PLT 172 05/09/2017   Recent Labs    02/28/17 1125 03/28/17 1339 05/09/17 1357  NA 138 138 136  K 3.9 3.6 4.1  CL 103 104 103  CO2 _0 GLUCOSE 119* 137* 144*  BUN 16 16 24*  CREATININE 0.70 0.63 0.81  CALCIUM 8.8* 8.4* 8.5*  GFRNONAA >60 >60 >60  GFRAA >60 >60 >60  PROT 6.3* 5.8* 6.1*  ALBUMIN 3.9 3.5 3.6  AST _1 ALT 13* 11* 11*  ALKPHOS 60 53 59  BILITOT 0.7 0.6 0.4    RADIOGRAPHIC STUDIES: I have personally reviewed the radiological images as listed and agreed with the findings in the report. No results found.  Results for [name], [name] (MRN [id number]) as of 05/16/2017 12:12  Ref. Range 03/16/2016 14:59 04/27/2016 18:53 06/07/2016 13:47 06/28/2016 08:22 02/28/2017 11:25 03/28/2017 13:39 05/09/2017 13:57  Kappa free light chain Latest Ref Range: 3.3 - 19.4 mg/L 1,005.1 (H) 44.4 (H) 136.9 (H) 142.7 (H) 56.9 (H) 56.3 (H) 78.7 (H)  Lamda free light chains Latest Ref Range: 5.7 - 26.3 mg/L 7.1 5.4 (L) 23.4 24.4 10.5 9.0 12.4  Kappa, lamda light chain ratio Latest Ref Range: 0.26 - 1.65  141.56 (H) 8.22 (H) 5.85 (H) 5.85 (H) 5.42 (H) 6.26 (H) 6.35 (H)  M Protein  SerPl Elph-Mcnc Latest Ref Range: Not Observed g/dL 3.1 (H) 0.8 (H) 0.7 (H)  0.2 (H) 0.4 (H) 0.4 (H)       ASSESSMENT & PLAN:   Multiple myeloma in relapse (HCC) Multiple myeloma M protein- 3.1 g/dL; kappa/lambda ratio 140; multiple bone lesions [sep 2017]. Status post RVD [poor tolerance]; status post Pom-Dex [stopped in June 2018]- secondary to poor tolerance.  # Most recent myeloma workup- biochemical recurrence. [ M- protein 01/19/2017 [[place]]- 0.5; kappa light chains 52; ratio 0.5; OCT 2018- M protein 0.4; kappa light chains 63; normal renal/calcium; hemoglobin-11; platelets normal]  # Continue surveillance at this time; Patient not too keen on starting treatment.    # "Low-grade fevers "- states that he  had similar episodes when he was first diagnosed with multiple myeloma. Recommend keeping a log of his temperature; and bringing up to the next visit. If he continues to get symptomatic/ also M protein rises- will consider restarting myeloma treatment- options being Pom/Len vs Ninlaro.   # sinus tachycardia- 110-120s- recommend adding coreg 3.125 BID. Discussed regarding low blood pressures; educated regarding   # Peripheral neuropathy grade 1-2 /multiple myeloma-Velcade.  # B12 deficiency-most recent checked 580; recommend by mouth B12 at home.  # Hypocalcemia- 8.5; recommend taking calcium and vitamin D at home.  # Multiple bone lesions - currently on zometa every 3 months. Last July 12th; hold today because of mildly low calcium. Recommend taking calcium and vitamin D. We'll plan again next visit.   # follow up in 6 weeks/labs/ myeloma panel/Zometa- labs- 1 week prior.  All questions were answered. The patient knows to call the clinic with any problems, questions or concerns.    Cammie Sickle, MD 05/16/2017 1:45 PM

## 2017-05-18 ENCOUNTER — Other Ambulatory Visit: Payer: Self-pay | Admitting: *Deleted

## 2017-05-18 ENCOUNTER — Telehealth: Payer: Self-pay | Admitting: *Deleted

## 2017-05-18 DIAGNOSIS — C9002 Multiple myeloma in relapse: Secondary | ICD-10-CM

## 2017-05-18 MED ORDER — CHOLECALCIFEROL 25 MCG (1000 UT) PO TABS: 1000.0000 [IU] | ORAL_TABLET | Freq: Every day | ORAL | 6 refills | Status: DC

## 2017-05-18 MED ORDER — CARVEDILOL 3.125 MG PO TABS: 3.1250 mg | ORAL_TABLET | Freq: Two times a day (BID) | ORAL | 3 refills | Status: AC

## 2017-05-18 MED ORDER — CHOLECALCIFEROL 25 MCG (1000 UT) PO TABS: 1000.0000 [IU] | ORAL_TABLET | Freq: Every day | ORAL | 6 refills | Status: AC

## 2017-05-18 MED ORDER — CARVEDILOL 3.125 MG PO TABS: 3.1250 mg | ORAL_TABLET | Freq: Two times a day (BID) | ORAL | 3 refills | Status: DC

## 2017-05-18 NOTE — Telephone Encounter (Signed)
Patient's caregiver stopped by [facility]. [facility] will not fill his vit. D rx or coreg as pt has not been evaluated by pcp there in over a year. Requesting new rx to be sent to [facility] in [place], [place].   This was sent per patient request.

## 2017-05-19 ENCOUNTER — Telehealth: Payer: Self-pay | Admitting: *Deleted

## 2017-05-19 NOTE — Telephone Encounter (Signed)
Per Dr. Tasia Catchings - pt's Son [facility] answering service. -requesting return phone call from clinic regarding a fever.   Call returned Pt reports fever of 101-102 and dizziness. Felt hot to touch, so he took his temp. Would like to take OTC tylenol. Has no other medical symptoms (no sore throat, chills, no gi symptoms). Pt's son instructed to proceed to take OTC tylenol. If symptoms worsen (chills, sore throat, cough/congestion) he may go to acute care in [facility] for further evaluation. Patient has h/o of multiple myeloma but is not currently on treatment.  Son voiced verbal understanding of the plan of care.

## 2017-05-29 ENCOUNTER — Encounter: Payer: Self-pay | Admitting: Internal Medicine

## 2017-05-30 ENCOUNTER — Inpatient Hospital Stay (HOSPITAL_BASED_OUTPATIENT_CLINIC_OR_DEPARTMENT_OTHER): Payer: Medicare Other | Admitting: Internal Medicine

## 2017-05-30 ENCOUNTER — Ambulatory Visit
Admission: RE | Admit: 2017-05-30 | Discharge: 2017-05-30 | Disposition: A | Discharge status: Home / Self Care | Payer: Medicare Other | Source: Ambulatory Visit | Attending: Internal Medicine | Admitting: Internal Medicine

## 2017-05-30 ENCOUNTER — Other Ambulatory Visit: Payer: Self-pay | Admitting: *Deleted

## 2017-05-30 ENCOUNTER — Inpatient Hospital Stay: Payer: Medicare Other

## 2017-05-30 VITALS — BP 130/84 | HR 93 | Temp 98.1°F | Resp 16

## 2017-05-30 DIAGNOSIS — R0602 Shortness of breath: Secondary | ICD-10-CM | POA: Diagnosis present

## 2017-05-30 DIAGNOSIS — S2232XG Fracture of one rib, left side, subsequent encounter for fracture with delayed healing: Secondary | ICD-10-CM | POA: Diagnosis not present

## 2017-05-30 DIAGNOSIS — G629 Polyneuropathy, unspecified: Secondary | ICD-10-CM | POA: Diagnosis not present

## 2017-05-30 DIAGNOSIS — I700 Atherosclerosis of aorta: Secondary | ICD-10-CM | POA: Diagnosis not present

## 2017-05-30 DIAGNOSIS — R0981 Nasal congestion: Secondary | ICD-10-CM

## 2017-05-30 DIAGNOSIS — C9002 Multiple myeloma in relapse: Secondary | ICD-10-CM

## 2017-05-30 DIAGNOSIS — Z8613 Personal history of malaria: Secondary | ICD-10-CM | POA: Diagnosis not present

## 2017-05-30 DIAGNOSIS — R000 Tachycardia, unspecified: Secondary | ICD-10-CM | POA: Diagnosis not present

## 2017-05-30 DIAGNOSIS — J449 Chronic obstructive pulmonary disease, unspecified: Secondary | ICD-10-CM | POA: Diagnosis not present

## 2017-05-30 DIAGNOSIS — K769 Liver disease, unspecified: Secondary | ICD-10-CM | POA: Diagnosis not present

## 2017-05-30 DIAGNOSIS — R5383 Other fatigue: Secondary | ICD-10-CM

## 2017-05-30 DIAGNOSIS — X58XXXD Exposure to other specified factors, subsequent encounter: Secondary | ICD-10-CM | POA: Diagnosis not present

## 2017-05-30 DIAGNOSIS — E538 Deficiency of other specified B group vitamins: Secondary | ICD-10-CM | POA: Diagnosis not present

## 2017-05-30 DIAGNOSIS — K5900 Constipation, unspecified: Secondary | ICD-10-CM | POA: Diagnosis not present

## 2017-05-30 DIAGNOSIS — Z8611 Personal history of tuberculosis: Secondary | ICD-10-CM

## 2017-05-30 DIAGNOSIS — R509 Fever, unspecified: Secondary | ICD-10-CM

## 2017-05-30 DIAGNOSIS — Z79899 Other long term (current) drug therapy: Secondary | ICD-10-CM

## 2017-05-30 LAB — CBC WITH DIFFERENTIAL/PLATELET
Basophils Absolute: 0 10*3/uL (ref 0–0.1)
Basophils Relative: 0 %
EOS ABS: 0 10*3/uL (ref 0–0.7)
Eosinophils Relative: 1 %
HEMATOCRIT: 34.1 % — AB (ref 40.0–52.0)
Hemoglobin: 11.4 g/dL — ABNORMAL LOW (ref 13.0–18.0)
Lymphocytes Relative: 39 %
Lymphs Abs: 1.9 10*3/uL (ref 1.0–3.6)
MCH: 29.5 pg (ref 26.0–34.0)
MCHC: 33.5 g/dL (ref 32.0–36.0)
MCV: 88.2 fL (ref 80.0–100.0)
MONO ABS: 0.4 10*3/uL (ref 0.2–1.0)
MONOS PCT: 8 %
NEUTROS ABS: 2.6 10*3/uL (ref 1.4–6.5)
Neutrophils Relative %: 52 %
Platelets: 169 10*3/uL (ref 150–440)
RBC: 3.87 MIL/uL — ABNORMAL LOW (ref 4.40–5.90)
RDW: 14.2 % (ref 11.5–14.5)
WBC: 5 10*3/uL (ref 3.8–10.6)

## 2017-05-30 LAB — COMPREHENSIVE METABOLIC PANEL
ALBUMIN: 3.8 g/dL (ref 3.5–5.0)
ALT: 9 U/L — ABNORMAL LOW (ref 17–63)
ANION GAP: 5 (ref 5–15)
AST: 20 U/L (ref 15–41)
Alkaline Phosphatase: 64 U/L (ref 38–126)
BILIRUBIN TOTAL: 0.6 mg/dL (ref 0.3–1.2)
BUN: 18 mg/dL (ref 6–20)
CO2: 26 mmol/L (ref 22–32)
Calcium: 8.3 mg/dL — ABNORMAL LOW (ref 8.9–10.3)
Chloride: 104 mmol/L (ref 101–111)
Creatinine, Ser: 0.74 mg/dL (ref 0.61–1.24)
GFR calc non Af Amer: >60 mL/min (ref >60)
GLUCOSE: 102 mg/dL — AB (ref 65–99)
POTASSIUM: 4 mmol/L (ref 3.5–5.1)
Sodium: 135 mmol/L (ref 135–145)
TOTAL PROTEIN: 6.5 g/dL (ref 6.5–8.1)

## 2017-05-30 LAB — URINALYSIS, COMPLETE (UACMP) WITH MICROSCOPIC
BACTERIA UA: NONE SEEN
Bilirubin Urine: NEGATIVE
GLUCOSE, UA: NEGATIVE mg/dL
HGB URINE DIPSTICK: NEGATIVE
LEUKOCYTES UA: NEGATIVE
NITRITE: NEGATIVE
PROTEIN: 30 mg/dL — AB
RBC / HPF: NONE SEEN RBC/hpf (ref 0–5)
SPECIFIC GRAVITY, URINE: 1.025 (ref 1.005–1.030)
pH: 6 (ref 5.0–8.0)

## 2017-05-30 LAB — C-REACTIVE PROTEIN: CRP: <0.8 mg/dL (ref <1.0)

## 2017-05-30 LAB — TSH: TSH: 0.813 u[IU]/mL (ref 0.350–4.500)

## 2017-05-30 NOTE — Progress Notes (Signed)
[facility] NOTE  Patient Care Team: Zhou-Talbert, Elwyn Lade, MD as PCP - General (Family Medicine) Christene Lye, MD (General Surgery) Leonie Green, MD as Referring Physician (Surgery)  [name]; [facility]  CHIEF COMPLAINTS/PURPOSE OF CONSULTATION:  Multiple lytic lesions  #  Oncology History   # SEP 2017- Multiple Myeloma lytic lesions- Thoracic spine/ vertebral compression fractures; IgG Kappa- 3.1gm/dl; K/L-140. Vel-Rev- VGPR; transferred care to [place] Stopped Pom-Dex-July 2018 sec to intol/PN; on surviellance.   # AUg 2018- back in [place]  # s/p RT to Thoracic spine [Dr.Crystal; Oct 2018]  # FTT s/p PEG [Dr. Elliot];   # Abnormal CXR;? TB no treatment- chronic     Multiple myeloma in relapse (HCC)     HISTORY OF PRESENTING ILLNESS:  Francisco [name] 71 y.o.  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    Patient continues to have low-grade temperatures more consistently for the last 1 month or so.  He denies any night sweats.  Does admit to cold intolerance.  He feels weak.  Denies any pain.  Does complain of mild shortness of breath.  Nasal congestion/discharge.  His appetite is good.  No weight loss.  He goes around with a walker at home.  ROS: A complete 10 point review of system is done which is negative except mentioned above in history of present illness  MEDICAL HISTORY:  Past Medical History:  Diagnosis Date  . Anemia   . Cancer (HCC)    Bone metastasis  . Constipation   . Hearing loss   . Hoarse voice quality   . Liver lesion   . Malaria 2003  . Multiple myeloma ([place]) 03/22/2016   Per Dr. Rogue Bussing on his PET order.  . Tuberculosis 1985    SURGICAL HISTORY: Past Surgical History:  Procedure Laterality Date  . PEG PLACEMENT N/A 05/18/2016   Procedure: PERCUTANEOUS ENDOSCOPIC GASTROSTOMY (PEG) PLACEMENT;  Surgeon: Manya Silvas,  MD;  Location: [facility] ENDOSCOPY;  Service: Endoscopy;  Laterality: N/A;  . 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 No smoking or alcohol.  Social History   Socioeconomic History  . Marital status: Married    Spouse name: Not on file  . Number of children: Not on file  . Years of education: Not on file  . Highest education level: Not on file  Social Needs  . Financial resource strain: Not on file  . Food insecurity - worry: Not on file  . Food insecurity - inability: Not on file  . Transportation needs - medical: Not on file  . Transportation needs - non-medical: Not on file  Occupational History  . Not on file  Tobacco Use  . Smoking status: Never Smoker  . Smokeless tobacco: Never Used  Substance and Sexual Activity  . Alcohol use: No  . Drug use: No  . Sexual activity: Not on file  Other Topics Concern  . Not on file  Social History Narrative  . Not on file    FAMILY HISTORY: no history of cancers in the family. Family History  Problem Relation Age of Onset  . Hypertension Mother     ALLERGIES:  has No Known Allergies.  MEDICATIONS:  Current Outpatient Medications  Medication Sig Dispense Refill  . carvedilol (COREG) 3.125 MG tablet Take 1 tablet (3.125 mg total) 2 (two) times daily with a meal by mouth. (Patient  not taking: Reported on 05/30/2017) 60 tablet 3  . Cholecalciferol (PA VITAMIN D-3) 1000 units tablet Take 1 tablet (1,000 Units total) daily by mouth. (Patient not taking: Reported on 05/30/2017) 30 tablet 6  . cyanocobalamin 1000 MCG tablet Take 1,000 mcg by mouth daily.     No current facility-administered medications for this visit.       Marland Kitchen  PHYSICAL EXAMINATION: ECOG PERFORMANCE STATUS: 3 - Symptomatic, >50% confined to bed  Vitals:   05/30/17 1036  BP: 130/84  Pulse: 93  Resp: 16  Temp: 98.1 F (36.7 C)   There were no vitals filed for this  visit.  GENERAL: moderately nourished; moderately built; male patient; Alert, no distress and comfortable.   With family. Walking with a rolling walker. EYES: no pallor or icterus OROPHARYNX: no thrush ulceration. NECK: supple, no masses felt LYMPH:  no palpable lymphadenopathy in the cervical, axillary or inguinal regions LUNGS: clear to auscultation and  No wheeze or crackles HEART/CVS: regular rate & rhythm and no murmurs; No lower extremity edema ABDOMEN: abdomen soft, non-tender and normal bowel sounds.  Musculoskeletal:no cyanosis of digits and no clubbing  PSYCH: alert & oriented x 3 with fluent speech NEURO: no focal motor/sensory deficits SKIN:  no rashes or significant lesions  LABORATORY DATA:  I have reviewed the data as listed Lab Results  Component Value Date   WBC 5.0 05/30/2017   HGB 11.4 (L) 05/30/2017   HCT 34.1 (L) 05/30/2017   MCV 88.2 05/30/2017   PLT 169 05/30/2017   Recent Labs    03/28/17 1339 05/09/17 1357 05/30/17 1018  NA 138 136 135  K 3.6 4.1 4.0  CL 104 103 104  CO2 28 29 26   GLUCOSE 137* 144* 102*  BUN 16 24* 18  CREATININE 0.63 0.81 0.74  CALCIUM 8.4* 8.5* 8.3*  GFRNONAA >60 >60 >60  GFRAA >60 >60 >60  PROT 5.8* 6.1* 6.5  ALBUMIN 3.5 3.6 3.8  AST 20 20 20   ALT 11* 11* 9*  ALKPHOS 53 59 64  BILITOT 0.6 0.4 0.6    RADIOGRAPHIC STUDIES: I have personally reviewed the radiological images as listed and agreed with the findings in the report. Dg Chest 2 View  Result Date: 05/30/2017 CLINICAL DATA:  One week of fever, chills, weakness, and shortness of breath. History of pneumonia, multiple myeloma in relapse, never smoked. EXAM: CHEST  2 VIEW COMPARISON:  Portable chest x-ray of May 17, 2016 and PA and lateral chest x-ray of May 10, 2016. FINDINGS: The lungs remain hyperinflated. There is no focal infiltrate. There is stable biapical pleural thickening. There is no pleural effusion. The heart is top-normal in size but stable. The  pulmonary vascularity is normal. There is calcification in the wall of the aortic arch. There it is a subacute or old fracture of the lateral aspect of the left ninth rib. There is periosteal reaction but no clear bony bridging. There is old deformity of the lateral aspect of the left eighth rib is well. There are wedge compressions of the bodies of T8, T9, and T10 which are stable since the May 10, 2016 PA and lateral chest x-ray. IMPRESSION: COPD.  No acute pneumonia nor pulmonary edema. Thoracic aortic atherosclerosis. Incomplete healing of the fracture of the lateral aspect of the left ninth rib. Electronically Signed   By: David  Martinique M.D.   On: 05/30/2017 16:56    Results for Francisco [name], Francisco [name] "[facility]" (MRN [id number]) as of 05/16/2017 12:12  Ref. Range 03/16/2016  14:59 04/27/2016 18:53 06/07/2016 13:47 06/28/2016 08:22 02/28/2017 11:25 03/28/2017 13:39 05/09/2017 13:57  Kappa free light chain Latest Ref Range: 3.3 - 19.4 mg/L 1,005.1 (H) 44.4 (H) 136.9 (H) 142.7 (H) 56.9 (H) 56.3 (H) 78.7 (H)  Lamda free light chains Latest Ref Range: 5.7 - 26.3 mg/L 7.1 5.4 (L) 23.4 24.4 10.5 9.0 12.4  Kappa, lamda light chain ratio Latest Ref Range: 0.26 - 1.65  141.56 (H) 8.22 (H) 5.85 (H) 5.85 (H) 5.42 (H) 6.26 (H) 6.35 (H)  M Protein SerPl Elph-Mcnc Latest Ref Range: Not Observed g/dL 3.1 (H) 0.8 (H) 0.7 (H)  0.2 (H) 0.4 (H) 0.4 (H)       ASSESSMENT & PLAN:   Multiple myeloma in relapse (HCC) Multiple myeloma M protein- 3.1 g/dL; kappa/lambda ratio 140; multiple bone lesions [sep 2017]. Status post RVD [poor tolerance]; status post Pom-Dex [stopped in June 2018]- secondary to poor tolerance.  # Most recent myeloma workup-shows biochemical recurrence. [ M- protein 01/19/2017 [[place]]- 0.5; kappa light chains 52; ratio 0.5; OCT 2018- M protein 0.4; kappa light chains 78; normal renal/calcium; hemoglobin-11; platelets normal]  # Continue surveillance at this time-no clinical evidence of  symptomatic myeloma [see discussion below]; Patient not too keen on starting treatment.    # "Low-grade fevers "- states that he had similar episodes when he was first diagnosed with multiple myeloma.  Recommend chest x-ray urinalysis CRP TSH. If he continues to get symptomatic/ also M protein rises-PET scan; and will consider restarting myeloma treatment- options being Pom/Len vs Ninlaro.   # sinus tachycardia- 110-120s-on Coreg improved.  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  # B12 deficiency-most recent checked 580; recommend by mouth B12 at home.  # Hypocalcemia- 8.5; recommend taking calcium and vitamin D at home.  # Multiple bone lesions - currently on zometa every 3 months. Last July 12th; hold today because of mildly low calcium.   #Follow-up in 2 weeks; no labs.  # check CXR; UA/urine culture; Check TSH today. CRP. Will call with results-  [phone] [facility].    Addendum: Discussed with patient's friend Prakash-the results of the infectious workup.  Fairly unremarkable for infection.  Question viral infection versus myeloma recurrence/progression.  Recommend Tylenol once a day for the next 1 week; call us if symptoms not better-then get a PET scan.   All questions were answered. The patient knows to call the clinic with any problems, questions or concerns.    Cammie Sickle, MD 05/31/2017 1:52 PM

## 2017-05-30 NOTE — Assessment & Plan Note (Addendum)
Multiple myeloma M protein- 3.1 g/dL; kappa/lambda ratio 140; multiple bone lesions [sep 2017]. Status post RVD [poor tolerance]; status post Pom-Dex [stopped in June 2018]- secondary to poor tolerance.  # Most recent myeloma workup-shows biochemical recurrence. [ M- protein 01/19/2017 [[place]]- 0.5; kappa light chains 52; ratio 0.5; OCT 2018- M protein 0.4; kappa light chains 78; normal renal/calcium; hemoglobin-11; platelets normal]  # Continue surveillance at this time-no clinical evidence of symptomatic myeloma [see discussion below]; Patient not too keen on starting treatment.    # "Low-grade fevers "- states that he had similar episodes when he was first diagnosed with multiple myeloma.  Recommend chest x-ray urinalysis CRP TSH. If he continues to get symptomatic/ also M protein rises-PET scan; and will consider restarting myeloma treatment- options being Pom/Len vs Ninlaro.   # sinus tachycardia- 110-120s-on Coreg improved.  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  # B12 deficiency-most recent checked 580; recommend by mouth B12 at home.  # Hypocalcemia- 8.5; recommend taking calcium and vitamin D at home.  # Multiple bone lesions - currently on zometa every 3 months. Last July 12th; hold today because of mildly low calcium.   #Follow-up in 2 weeks; no labs.  # check CXR; UA/urine culture; Check TSH today. CRP. Will call with results-  [phone] [facility].    Addendum: Discussed with patient's friend Prakash-the results of the infectious workup.  Fairly unremarkable for infection.  Question viral infection versus myeloma recurrence/progression.  Recommend Tylenol once a day for the next 1 week; call us if symptoms not better-then get a PET scan.

## 2017-05-31 LAB — KAPPA/LAMBDA LIGHT CHAINS
KAPPA FREE LGHT CHN: 92.6 mg/L — AB (ref 3.3–19.4)
Kappa, lambda light chain ratio: 9.75 — ABNORMAL HIGH (ref 0.26–1.65)
Lambda free light chains: 9.5 mg/L (ref 5.7–26.3)

## 2017-05-31 LAB — URINE CULTURE

## 2017-06-01 LAB — MULTIPLE MYELOMA PANEL, SERUM
ALBUMIN SERPL ELPH-MCNC: 3.6 g/dL (ref 2.9–4.4)
Albumin/Glob SerPl: 1.3 (ref 0.7–1.7)
Alpha 1: 0.2 g/dL (ref 0.0–0.4)
Alpha2 Glob SerPl Elph-Mcnc: 0.5 g/dL (ref 0.4–1.0)
B-GLOBULIN SERPL ELPH-MCNC: 0.8 g/dL (ref 0.7–1.3)
Gamma Glob SerPl Elph-Mcnc: 1.3 g/dL (ref 0.4–1.8)
Globulin, Total: 2.8 g/dL (ref 2.2–3.9)
IGA: 66 mg/dL (ref 61–437)
IgG (Immunoglobin G), Serum: 1361 mg/dL (ref 700–1600)
IgM (Immunoglobulin M), Srm: 18 mg/dL (ref 15–143)
M PROTEIN SERPL ELPH-MCNC: 0.5 g/dL — AB
TOTAL PROTEIN ELP: 6.4 g/dL (ref 6.0–8.5)

## 2017-06-13 ENCOUNTER — Other Ambulatory Visit: Payer: Self-pay

## 2017-06-13 ENCOUNTER — Inpatient Hospital Stay: Payer: Medicare Other | Attending: Internal Medicine | Admitting: Internal Medicine

## 2017-06-13 VITALS — BP 102/73 | HR 96 | Temp 96.6°F | Resp 18 | Wt 100.5 lb

## 2017-06-13 DIAGNOSIS — Z923 Personal history of irradiation: Secondary | ICD-10-CM | POA: Diagnosis not present

## 2017-06-13 DIAGNOSIS — Z8619 Personal history of other infectious and parasitic diseases: Secondary | ICD-10-CM

## 2017-06-13 DIAGNOSIS — Z9221 Personal history of antineoplastic chemotherapy: Secondary | ICD-10-CM | POA: Insufficient documentation

## 2017-06-13 DIAGNOSIS — G629 Polyneuropathy, unspecified: Secondary | ICD-10-CM

## 2017-06-13 DIAGNOSIS — C9002 Multiple myeloma in relapse: Secondary | ICD-10-CM | POA: Diagnosis present

## 2017-06-13 DIAGNOSIS — D649 Anemia, unspecified: Secondary | ICD-10-CM

## 2017-06-13 DIAGNOSIS — Z79899 Other long term (current) drug therapy: Secondary | ICD-10-CM | POA: Insufficient documentation

## 2017-06-13 DIAGNOSIS — K5900 Constipation, unspecified: Secondary | ICD-10-CM | POA: Diagnosis not present

## 2017-06-13 DIAGNOSIS — R509 Fever, unspecified: Secondary | ICD-10-CM | POA: Diagnosis not present

## 2017-06-13 DIAGNOSIS — A158 Other respiratory tuberculosis: Secondary | ICD-10-CM | POA: Insufficient documentation

## 2017-06-13 DIAGNOSIS — M549 Dorsalgia, unspecified: Secondary | ICD-10-CM | POA: Insufficient documentation

## 2017-06-13 NOTE — Progress Notes (Signed)
[place] NOTE  Patient Care Team: Zhou-Talbert, Elwyn Lade, MD as PCP - General (Family Medicine) Christene Lye, MD (General Surgery) Leonie Green, MD as Referring Physician (Surgery)  Francisco [name]; [facility]  CHIEF COMPLAINTS/PURPOSE OF CONSULTATION:  Multiple lytic lesions  #  Oncology History   # SEP 2017- Multiple Myeloma lytic lesions- Thoracic spine/ vertebral compression fractures; IgG Kappa- 3.1gm/dl; K/L-140. Vel-Rev- VGPR; transferred care to [place] [facility] Stopped Pom-Dex-July 2018 sec to intol/PN; on surviellance.   # AUg 2018- back in [place]  # s/p RT to Thoracic spine [Dr.Crystal; Oct 2018]  # FTT s/p PEG [Dr. Elliot];   # Abnormal CXR;? TB no treatment- chronic     Multiple myeloma in relapse (HCC)     HISTORY OF PRESENTING ILLNESS:  Francisco [name] 71 y.o.  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    Patient continues to have low-grade temperatures more consistently for the last 2 month or so.  He denies any night sweats.  Does admit to cold intolerance.  He has been taking Tylenol half a pill in the morning and evening.;  This seems to be helping.  Patient's appetite fluctuates with his "fevers".   Continues to have intermittent back pain. He does complain of mild shortness of breath.  No cough.  No weight loss.  He goes around with a walker at home.  ROS: A complete 10 point review of system is done which is negative except mentioned above in history of present illness  MEDICAL HISTORY:  Past Medical History:  Diagnosis Date  . Anemia   . Cancer (HCC)    Bone metastasis  . Constipation   . Hearing loss   . Hoarse voice quality   . Liver lesion   . Malaria 2003  . Multiple myeloma ([place]) 03/22/2016   Per Dr. Rogue Bussing on his PET order.  . Tuberculosis 1985    SURGICAL HISTORY: Past Surgical History:  Procedure Laterality Date  . PEG  PLACEMENT N/A 05/18/2016   Procedure: PERCUTANEOUS ENDOSCOPIC GASTROSTOMY (PEG) PLACEMENT;  Surgeon: Manya Silvas, MD;  Location[facility] ENDOSCOPY;  Service: Endoscopy;  Laterality: N/A;  . 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 No smoking or alcohol.  Social History   Socioeconomic History  . Marital status: Married    Spouse name: Not on file  . Number of children: Not on file  . Years of education: Not on file  . Highest education level: Not on file  Social Needs  . Financial resource strain: Not on file  . Food insecurity - worry: Not on file  . Food insecurity - inability: Not on file  . Transportation needs - medical: Not on file  . Transportation needs - non-medical: Not on file  Occupational History  . Not on file  Tobacco Use  . Smoking status: Never Smoker  . Smokeless tobacco: Never Used  Substance and Sexual Activity  . Alcohol use: No  . Drug use: No  . Sexual activity: Not on file  Other Topics Concern  . Not on file  Social History Narrative  . Not on file    FAMILY HISTORY: no history of cancers in the family. Family History  Problem Relation Age of Onset  . Hypertension Mother     ALLERGIES:  has No Known Allergies.  MEDICATIONS:  Current Outpatient Medications  Medication Sig Dispense Refill  .  acetaminophen (TYLENOL) 325 MG tablet Take 650 mg by mouth every 6 (six) hours as needed.    . Cholecalciferol (PA VITAMIN D-3) 1000 units tablet Take 1 tablet (1,000 Units total) daily by mouth. 30 tablet 6  . cyanocobalamin 1000 MCG tablet Take 1,000 mcg by mouth daily.    . carvedilol (COREG) 3.125 MG tablet Take 1 tablet (3.125 mg total) 2 (two) times daily with a meal by mouth. (Patient not taking: Reported on 05/30/2017) 60 tablet 3   No current facility-administered medications for this visit.       Marland Kitchen  PHYSICAL EXAMINATION: ECOG PERFORMANCE STATUS: 3 -  Symptomatic, >50% confined to bed  Vitals:   06/13/17 1130  BP: 102/73  Pulse: 96  Resp: 18  Temp: (!) 96.6 F (35.9 C)   Filed Weights   06/13/17 1130  Weight: 100 lb 8.1 oz (45.6 kg)    GENERAL: moderately nourished; moderately built; male patient; Alert, no distress and comfortable.   With family. Walking with a rolling walker. EYES: no pallor or icterus OROPHARYNX: no thrush ulceration. NECK: supple, no masses felt LYMPH:  no palpable lymphadenopathy in the cervical, axillary or inguinal regions LUNGS: clear to auscultation and  No wheeze or crackles HEART/CVS: regular rate & rhythm and no murmurs; No lower extremity edema ABDOMEN: abdomen soft, non-tender and normal bowel sounds.  Musculoskeletal:no cyanosis of digits and no clubbing  PSYCH: alert & oriented x 3 with fluent speech NEURO: no focal motor/sensory deficits SKIN:  no rashes or significant lesions  LABORATORY DATA:  I have reviewed the data as listed Lab Results  Component Value Date   WBC 5.0 05/30/2017   HGB 11.4 (L) 05/30/2017   HCT 34.1 (L) 05/30/2017   MCV 88.2 05/30/2017   PLT 169 05/30/2017   Recent Labs    03/28/17 1339 05/09/17 1357 05/30/17 1018  NA 138 136 135  K 3.6 4.1 4.0  CL 104 103 104  CO2 28 29 26   GLUCOSE 137* 144* 102*  BUN 16 24* 18  CREATININE 0.63 0.81 0.74  CALCIUM 8.4* 8.5* 8.3*  GFRNONAA >60 >60 >60  GFRAA >60 >60 >60  PROT 5.8* 6.1* 6.5  ALBUMIN 3.5 3.6 3.8  AST 20 20 20   ALT 11* 11* 9*  ALKPHOS 53 59 64  BILITOT 0.6 0.4 0.6    RADIOGRAPHIC STUDIES: I have personally reviewed the radiological images as listed and agreed with the findings in the report. Dg Chest 2 View  Result Date: 05/30/2017 CLINICAL DATA:  One week of fever, chills, weakness, and shortness of breath. History of pneumonia, multiple myeloma in relapse, never smoked. EXAM: CHEST  2 VIEW COMPARISON:  Portable chest x-ray of May 17, 2016 and PA and lateral chest x-ray of May 10, 2016.  FINDINGS: The lungs remain hyperinflated. There is no focal infiltrate. There is stable biapical pleural thickening. There is no pleural effusion. The heart is top-normal in size but stable. The pulmonary vascularity is normal. There is calcification in the wall of the aortic arch. There it is a subacute or old fracture of the lateral aspect of the left ninth rib. There is periosteal reaction but no clear bony bridging. There is old deformity of the lateral aspect of the left eighth rib is well. There are wedge compressions of the bodies of T8, T9, and T10 which are stable since the May 10, 2016 PA and lateral chest x-ray. IMPRESSION: COPD.  No acute pneumonia nor pulmonary edema. Thoracic aortic atherosclerosis. Incomplete healing  of the fracture of the lateral aspect of the left ninth rib. Electronically Signed   By: David  Martinique M.D.   On: 05/30/2017 16:56     Results for YUAN, GANN "[facility]" (MRN [id number]) as of 06/13/2017 13:07  Ref. Range 07/12/2016 14:05 07/19/2016 14:13 02/28/2017 11:25 03/28/2017 13:39 05/09/2017 13:57 05/30/2017 10:18 05/30/2017 11:33 05/30/2017 12:08  Kappa free light chain Latest Ref Range: 3.3 - 19.4 mg/L   56.9 (H) 56.3 (H) 78.7 (H) 92.6 (H)    Lamda free light chains Latest Ref Range: 5.7 - 26.3 mg/L   10.5 9.0 12.4 9.5    Kappa, lamda light chain ratio Latest Ref Range: 0.26 - 1.65    5.42 (H) 6.26 (H) 6.35 (H) 9.75 (H)    M Protein SerPl Elph-Mcnc Latest Ref Range: Not Observed g/dL   0.2 (H) 0.4 (H) 0.4 (H) 0.5 (H)         ASSESSMENT & PLAN:   Multiple myeloma in relapse (HCC) Multiple myeloma M protein- 3.1 g/dL; kappa/lambda ratio 140; multiple bone lesions [sep 2017]. Status post RVD [poor tolerance]; status post Pom-Dex [stopped in June 2018]- secondary to poor tolerance.  # Most recent myeloma workup-shows biochemical recurrence. [ M- protein 01/19/2017 [[place]]- 0.5; kappa light chains 52; ratio 0.5;Nov 2018- M protein 0.5; kappa light chains  92; ratio=9.5; normal renal/calcium; hemoglobin-11; platelets normal]  # "Low-grade fevers"/back pain-recent infectious workup including chest x-ray urinalysis all negative.  Not clear if this is a manifestation of patient's underlying multiple myeloma.  Recommend a PET scan for further.   # If he continues to get symptomatic/ also M protein rises-PET scan-is positive for recurrence; and will consider restarting myeloma treatment- options being Pom/Len vs Nin[place]o.   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  # Multiple bone lesions - currently on zometa every 3 months. Last July 12th; hold today because of mildly low calcium.  He is due for Zometa next week.   #Recommend PET scan ASAP; follow-up with me next week as planned.  I spoke to patient's son Francisco [phone] regarding above plan he agrees.  Patient/family also considering moving to [place] for warmer weather.  All questions were answered. The patient knows to call the clinic with any problems, questions or concerns.    Cammie Sickle, MD 06/13/2017 1:15 PM

## 2017-06-13 NOTE — Progress Notes (Signed)
Pt here for follow up. Stated no pain c/o today. When asked about medications-added that he takes Tylenol 325 mg x 1 for temp over 98.1  ( educated re tylenol use added it helps him )

## 2017-06-13 NOTE — Assessment & Plan Note (Addendum)
Multiple myeloma M protein- 3.1 g/dL; kappa/lambda ratio 140; multiple bone lesions [sep 2017]. Status post RVD [poor tolerance]; status post Pom-Dex [stopped in June 2018]- secondary to poor tolerance.  # Most recent myeloma workup-shows biochemical recurrence. [ M- protein 01/19/2017 [[place]]- 0.5; kappa light chains 52; ratio 0.5;Nov 2018- M protein 0.5; kappa light chains 92; ratio=9.5; normal renal/calcium; hemoglobin-11; platelets normal]  # "Low-grade fevers"/back pain-recent infectious workup including chest x-ray urinalysis all negative.  Not clear if this is a manifestation of patient's underlying multiple myeloma.  Recommend a PET scan for further.   # If he continues to get symptomatic/ also M protein rises-PET scan-is positive for recurrence; and will consider restarting myeloma treatment- options being Pom/Len vs Ninlaro.   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  # Multiple bone lesions - currently on zometa every 3 months. Last July 12th; hold today because of mildly low calcium.  He is due for Zometa next week.   #Recommend PET scan ASAP; follow-up with me next week as planned.  I spoke to patient's son [name] regarding above plan he agrees.  Patient/family also considering moving to [place] for warmer weather.

## 2017-06-15 ENCOUNTER — Encounter: Payer: Self-pay | Admitting: Internal Medicine

## 2017-06-16 ENCOUNTER — Telehealth: Payer: Self-pay | Admitting: Internal Medicine

## 2017-06-16 ENCOUNTER — Other Ambulatory Visit: Payer: Self-pay | Admitting: Internal Medicine

## 2017-06-16 DIAGNOSIS — A689 Relapsing fever, unspecified: Secondary | ICD-10-CM

## 2017-06-16 NOTE — Telephone Encounter (Signed)
I emailed the patient's son that the malarial test is ordered.  So do not have to call him

## 2017-06-16 NOTE — Progress Notes (Signed)
I have ordered the malarial parasite test on blood this could be drawn when pt comes back next wed- 12/12.   I will see him as planned on 12/18.   Please inform pt's son- that I have ordered the test.

## 2017-06-20 ENCOUNTER — Other Ambulatory Visit: Payer: Medicare Other

## 2017-06-20 NOTE — Progress Notes (Signed)
noted 

## 2017-06-21 ENCOUNTER — Inpatient Hospital Stay: Payer: Medicare Other

## 2017-06-21 DIAGNOSIS — C9002 Multiple myeloma in relapse: Secondary | ICD-10-CM

## 2017-06-21 LAB — COMPREHENSIVE METABOLIC PANEL
ALBUMIN: 3.8 g/dL (ref 3.5–5.0)
ALT: 11 U/L — AB (ref 17–63)
AST: 21 U/L (ref 15–41)
Alkaline Phosphatase: 61 U/L (ref 38–126)
Anion gap: 6 (ref 5–15)
BUN: 25 mg/dL — AB (ref 6–20)
CALCIUM: 8.6 mg/dL — AB (ref 8.9–10.3)
CHLORIDE: 104 mmol/L (ref 101–111)
CO2: 28 mmol/L (ref 22–32)
Creatinine, Ser: 0.85 mg/dL (ref 0.61–1.24)
GFR calc Af Amer: >60 mL/min (ref >60)
GFR calc non Af Amer: >60 mL/min (ref >60)
Glucose, Bld: 116 mg/dL — ABNORMAL HIGH (ref 65–99)
Potassium: 4.2 mmol/L (ref 3.5–5.1)
SODIUM: 138 mmol/L (ref 135–145)
TOTAL PROTEIN: 6.9 g/dL (ref 6.5–8.1)
Total Bilirubin: 0.5 mg/dL (ref 0.3–1.2)

## 2017-06-21 LAB — CBC WITH DIFFERENTIAL/PLATELET
BASOS ABS: 0 10*3/uL (ref 0–0.1)
BASOS PCT: 1 %
EOS ABS: 0 10*3/uL (ref 0–0.7)
Eosinophils Relative: 1 %
HCT: 33.2 % — ABNORMAL LOW (ref 40.0–52.0)
Hemoglobin: 11.1 g/dL — ABNORMAL LOW (ref 13.0–18.0)
LYMPHS PCT: 39 %
Lymphs Abs: 1.7 10*3/uL (ref 1.0–3.6)
MCH: 29.5 pg (ref 26.0–34.0)
MCHC: 33.5 g/dL (ref 32.0–36.0)
MCV: 87.9 fL (ref 80.0–100.0)
MONO ABS: 0.4 10*3/uL (ref 0.2–1.0)
Monocytes Relative: 8 %
NEUTROS ABS: 2.3 10*3/uL (ref 1.4–6.5)
Neutrophils Relative %: 51 %
PLATELETS: 166 10*3/uL (ref 150–440)
RBC: 3.78 MIL/uL — ABNORMAL LOW (ref 4.40–5.90)
RDW: 14.4 % (ref 11.5–14.5)
WBC: 4.5 10*3/uL (ref 3.8–10.6)

## 2017-06-21 LAB — TSH: TSH: 0.79 u[IU]/mL (ref 0.350–4.500)

## 2017-06-22 ENCOUNTER — Ambulatory Visit
Admission: RE | Admit: 2017-06-22 | Discharge: 2017-06-22 | Disposition: A | Discharge status: Home / Self Care | Payer: Medicare Other | Source: Ambulatory Visit | Attending: Internal Medicine | Admitting: Internal Medicine

## 2017-06-22 DIAGNOSIS — Z79899 Other long term (current) drug therapy: Secondary | ICD-10-CM | POA: Insufficient documentation

## 2017-06-22 DIAGNOSIS — R918 Other nonspecific abnormal finding of lung field: Secondary | ICD-10-CM | POA: Insufficient documentation

## 2017-06-22 DIAGNOSIS — C9002 Multiple myeloma in relapse: Secondary | ICD-10-CM | POA: Diagnosis present

## 2017-06-22 LAB — KAPPA/LAMBDA LIGHT CHAINS
Kappa free light chain: 114.6 mg/L — ABNORMAL HIGH (ref 3.3–19.4)
Kappa, lambda light chain ratio: 12.46 — ABNORMAL HIGH (ref 0.26–1.65)
Lambda free light chains: 9.2 mg/L (ref 5.7–26.3)

## 2017-06-22 LAB — GLUCOSE, CAPILLARY: GLUCOSE-CAPILLARY: 82 mg/dL (ref 65–99)

## 2017-06-22 MED ORDER — FLUDEOXYGLUCOSE F - 18 (FDG) INJECTION
13.1600 | Freq: Once | INTRAVENOUS | Status: AC | PRN
  Administered 2017-06-22: 13.16 via INTRAVENOUS

## 2017-06-26 LAB — MULTIPLE MYELOMA PANEL, SERUM
Albumin SerPl Elph-Mcnc: 4 g/dL (ref 2.9–4.4)
Albumin/Glob SerPl: 1.7 (ref 0.7–1.7)
Alpha 1: 0.1 g/dL (ref 0.0–0.4)
Alpha2 Glob SerPl Elph-Mcnc: 0.5 g/dL (ref 0.4–1.0)
B-GLOBULIN SERPL ELPH-MCNC: 0.8 g/dL (ref 0.7–1.3)
GAMMA GLOB SERPL ELPH-MCNC: 1.2 g/dL (ref 0.4–1.8)
GLOBULIN, TOTAL: 2.5 g/dL (ref 2.2–3.9)
IGA: 64 mg/dL (ref 61–437)
IgG (Immunoglobin G), Serum: 1428 mg/dL (ref 700–1600)
IgM (Immunoglobulin M), Srm: 12 mg/dL — ABNORMAL LOW (ref 15–143)
M PROTEIN SERPL ELPH-MCNC: 0.4 g/dL — AB
Total Protein ELP: 6.5 g/dL (ref 6.0–8.5)

## 2017-06-27 ENCOUNTER — Inpatient Hospital Stay (HOSPITAL_BASED_OUTPATIENT_CLINIC_OR_DEPARTMENT_OTHER): Payer: Medicare Other | Admitting: Internal Medicine

## 2017-06-27 ENCOUNTER — Inpatient Hospital Stay: Payer: Medicare Other

## 2017-06-27 VITALS — BP 127/70 | HR 90 | Temp 97.4°F | Resp 16

## 2017-06-27 VITALS — BP 123/86 | HR 108 | Temp 97.4°F | Resp 16 | Wt 100.2 lb

## 2017-06-27 DIAGNOSIS — C9002 Multiple myeloma in relapse: Secondary | ICD-10-CM | POA: Diagnosis not present

## 2017-06-27 DIAGNOSIS — M549 Dorsalgia, unspecified: Secondary | ICD-10-CM

## 2017-06-27 DIAGNOSIS — Z923 Personal history of irradiation: Secondary | ICD-10-CM | POA: Diagnosis not present

## 2017-06-27 DIAGNOSIS — A158 Other respiratory tuberculosis: Secondary | ICD-10-CM | POA: Diagnosis not present

## 2017-06-27 DIAGNOSIS — Z8619 Personal history of other infectious and parasitic diseases: Secondary | ICD-10-CM

## 2017-06-27 DIAGNOSIS — K5900 Constipation, unspecified: Secondary | ICD-10-CM | POA: Diagnosis not present

## 2017-06-27 DIAGNOSIS — Z9221 Personal history of antineoplastic chemotherapy: Secondary | ICD-10-CM | POA: Diagnosis not present

## 2017-06-27 DIAGNOSIS — R509 Fever, unspecified: Secondary | ICD-10-CM

## 2017-06-27 DIAGNOSIS — Z79899 Other long term (current) drug therapy: Secondary | ICD-10-CM

## 2017-06-27 DIAGNOSIS — D649 Anemia, unspecified: Secondary | ICD-10-CM

## 2017-06-27 DIAGNOSIS — G629 Polyneuropathy, unspecified: Secondary | ICD-10-CM

## 2017-06-27 MED ORDER — SODIUM CHLORIDE 0.9 % IV SOLN
Freq: Once | INTRAVENOUS | Status: AC
  Administered 2017-06-27: 15:00:00 via INTRAVENOUS
  Filled 2017-06-27: qty 1000

## 2017-06-27 MED ORDER — ZOLEDRONIC ACID 4 MG/100ML IV SOLN
3.0000 mg | Freq: Once | INTRAVENOUS | Status: AC
  Administered 2017-06-27: 3 mg via INTRAVENOUS
  Filled 2017-06-27: qty 100

## 2017-06-27 MED ORDER — CEPHALEXIN 500 MG PO CAPS: 500.0000 mg | ORAL_CAPSULE | Freq: Three times a day (TID) | ORAL | 0 refills | Status: AC

## 2017-06-27 NOTE — Progress Notes (Signed)
[place] NOTE  Patient Care Team: Zhou-Talbert, Elwyn Lade, MD as PCP - General (Family Medicine) Christene Lye, MD (General Surgery) Leonie Green, MD as Referring Physician (Surgery)  [name]; [facility]  CHIEF COMPLAINTS/PURPOSE OF CONSULTATION:  Multiple lytic lesions  #  Oncology History   # SEP 2017- Multiple Myeloma lytic lesions- Thoracic spine/ vertebral compression fractures; IgG Kappa- 3.1gm/dl; K/L-140. Vel-Rev- VGPR; transferred care to [place].; Stopped Pom-Dex-July 2018 sec to intol/PN; on surviellance.   # AUg 2018- back in [place]  # s/p RT to Thoracic spine [Dr.Crystal; Oct 2018]  # FTT s/p PEG [Dr. Elliot];   # Abnormal CXR;? TB no treatment- chronic     Multiple myeloma in relapse (HCC)     HISTORY OF PRESENTING ILLNESS:  Olen Cordial 71 y.o.  male  multiple myeloma IgG kappa- multiple bone lesions/anemia- currently off any treatment/surveillance secondary to poor tolerance.  He is here to review the results of his PET scan.  Patient continues to have "low-grade temperatures 97".   He denies any night sweats.  Does admit to extreme cold intolerance.  He has stopped taking Tylenol.  He notes his "fevers"-improved after eating.  Denies any cough; or worsening shortness of breath.  Denies any back pain.  No weight loss.  He goes around with a walker at home.  ROS: A complete 10 point review of system is done which is negative except mentioned above in history of present illness  MEDICAL HISTORY:  Past Medical History:  Diagnosis Date  . Anemia   . Cancer (HCC)    Bone metastasis  . Constipation   . Hearing loss   . Hoarse voice quality   . Liver lesion   . Malaria 2003  . Multiple myeloma ([place]) 03/22/2016   Per Dr. Rogue Bussing on his PET order.  . Tuberculosis 1985    SURGICAL HISTORY: Past Surgical History:  Procedure Laterality Date  . PEG PLACEMENT N/A 05/18/2016   Procedure:  PERCUTANEOUS ENDOSCOPIC GASTROSTOMY (PEG) PLACEMENT;  Surgeon: Manya Silvas, MD;  Location[facility] END[facility]COPY;  Service: Endoscopy;  Laterality: N/A;  . 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 No smoking or alcohol.  Social History   Socioeconomic History  . Marital status: Married    Spouse name: Not on file  . Number of children: Not on file  . Years of education: Not on file  . Highest education level: Not on file  Social Needs  . Financial resource strain: Not on file  . Food insecurity - worry: Not on file  . Food insecurity - inability: Not on file  . Transportation needs - medical: Not on file  . Transportation needs - non-medical: Not on file  Occupational History  . Not on file  Tobacco Use  . Smoking status: Never Smoker  . Smokeless tobacco: Never Used  Substance and Sexual Activity  . Alcohol use: No  . Drug use: No  . Sexual activity: Not on file  Other Topics Concern  . Not on file  Social History Narrative  . Not on file    FAMILY HISTORY: no history of cancers in the family. Family History  Problem Relation Age of Onset  . Hypertension Mother     ALLERGIES:  has No Known Allergies.  MEDICATIONS:  Current Outpatient Medications  Medication Sig Dispense Refill  . acetaminophen (TYLENOL) 325 MG tablet Take 650 mg by mouth  every 6 (six) hours as needed.    . carvedilol (COREG) 3.125 MG tablet Take 1 tablet (3.125 mg total) 2 (two) times daily with a meal by mouth. (Patient not taking: Reported on 05/30/2017) 60 tablet 3  . cephALEXin (KEFLEX) 500 MG capsule Take 1 capsule (500 mg total) by mouth 3 (three) times daily. 30 capsule 0  . Cholecalciferol (PA VITAMIN D-3) 1000 units tablet Take 1 tablet (1,000 Units total) daily by mouth. 30 tablet 6  . cyanocobalamin 1000 MCG tablet Take 1,000 mcg by mouth daily.     No current facility-administered medications for this visit.        Marland Kitchen  PHYSICAL EXAMINATION: ECOG PERFORMANCE STATUS: 3 - Symptomatic, >50% confined to bed  Vitals:   06/27/17 1354  BP: 123/86  Pulse: (!) 108  Resp: 16  Temp: (!) 97.4 F (36.3 C)   Filed Weights   06/27/17 1354  Weight: 100 lb 3.2 oz (45.5 kg)    GENERAL: moderately nourished; moderately built; male patient; Alert, no distress and comfortable.   With family. Walking with a rolling walker. EYES: no pallor or icterus OROPHARYNX: no thrush ulceration. NECK: supple, no masses felt LYMPH:  no palpable lymphadenopathy in the cervical, axillary or inguinal regions LUNGS: clear to auscultation and  No wheeze or crackles HEART/CVS: regular rate & rhythm and no murmurs; No lower extremity edema ABDOMEN: abdomen soft, non-tender and normal bowel sounds.  Musculoskeletal:no cyanosis of digits and no clubbing  PSYCH: alert & oriented x 3 with fluent speech NEURO: no focal motor/sensory deficits SKIN:  no rashes or significant lesions  LABORATORY DATA:  I have reviewed the data as listed Lab Results  Component Value Date   WBC 4.5 06/21/2017   HGB 11.1 (L) 06/21/2017   HCT 33.2 (L) 06/21/2017   MCV 87.9 06/21/2017   PLT 166 06/21/2017   Recent Labs    05/09/17 1357 05/30/17 1018 06/21/17 1145  NA 136 135 138  K 4.1 4.0 4.2  CL 103 104 104  CO2 _0 GLUCOSE 144* 102* 116*  BUN 24* 18 25*  CREATININE 0.81 0.74 0.85  CALCIUM 8.5* 8.3* 8.6*  GFRNONAA >60 >60 >60  GFRAA >60 >60 >60  PROT 6.1* 6.5 6.9  ALBUMIN 3.6 3.8 3.8  AST _1 ALT 11* 9* 11*  ALKPHOS 59 64 61  BILITOT 0.4 0.6 0.5    RADIOGRAPHIC STUDIES: I have personally reviewed the radiological images as listed and agreed with the findings in the report. Dg Chest 2 View  Result Date: 05/30/2017 CLINICAL DATA:  One week of fever, chills, weakness, and shortness of breath. History of pneumonia, multiple myeloma in relapse, never smoked. EXAM: CHEST  2 VIEW COMPARISON:  Portable chest x-ray of  May 17, 2016 and PA and lateral chest x-ray of May 10, 2016. FINDINGS: The lungs remain hyperinflated. There is no focal infiltrate. There is stable biapical pleural thickening. There is no pleural effusion. The heart is top-normal in size but stable. The pulmonary vascularity is normal. There is calcification in the wall of the aortic arch. There it is a subacute or old fracture of the lateral aspect of the left ninth rib. There is periosteal reaction but no clear bony bridging. There is old deformity of the lateral aspect of the left eighth rib is well. There are wedge compressions of the bodies of T8, T9, and T10 which are stable since the May 10, 2016 PA and lateral chest x-ray. IMPRESSION:  COPD.  No acute pneumonia nor pulmonary edema. Thoracic aortic atherosclerosis. Incomplete healing of the fracture of the lateral aspect of the left ninth rib. Electronically Signed   By: David  Martinique M.D.   On: 05/30/2017 16:56   Nm Pet Image Restage (ps) Whole Body  Result Date: 06/22/2017 CLINICAL DATA:  Subsequent treatment strategy for multiple myeloma. EXAM: NUCLEAR MEDICINE PET WHOLE BODY TECHNIQUE: 13.2 mCi F-18 FDG was injected intravenously. Full-ring PET imaging was performed from the vertex to the feet after the radiotracer. CT data was obtained and used for attenuation correction and anatomic localization. FASTING BLOOD GLUCOSE:  Value: 82 mg/dl COMPARISON:  03/29/2016 FINDINGS: HEAD/NECK No hypermetabolic activity in the neck. No hypermetabolic cervical lymph nodes. CHEST No hypermetabolic mediastinal or hilar nodes. No suspicious pulmonary nodules on the CT scan. Airspace opacity in the right lower lobe shows mild hypermetabolism with SUV max of 2.2. This is most likely an infiltrate. The left-sided chest wall lesion has resolved. ABDOMEN/PELVIS No abnormal hypermetabolic activity within the liver, pancreas, adrenal glands, or spleen. No hypermetabolic lymph nodes in the abdomen or pelvis.  SKELETON No focal hypermetabolic activity. Severe diffuse changes of multiple myeloma with lytic lesions throughout the axial and appendicular skeleton. No findings to suggest pathologic fracture. EXTREMITIES No abnormal hypermetabolic activity in the lower extremities. IMPRESSION: 1. Severe and diffuse bony changes of multiple myeloma involving the axial and appendicular skeleton. No areas of hypermetabolism. No obvious pathologic fractures or spinal canal compromise. 2. Small right lower lobe infiltrate. Electronically Signed   By: Marijo Sanes M.D.   On: 06/22/2017 16:12      Results for JAYCEION, LISENBY "[facility]" (MRN [id number]) as of 06/27/2017 14:06  Ref. Range 04/29/2016 08:35 04/29/2016 08:35 04/29/2016 17:35 04/30/2016 07:50 04/30/2016 21:46 05/01/2016 08:17 05/01/2016 23:34 05/02/2016 10:03 05/03/2016 04:37 05/04/2016 04:50 05/10/2016 09:16 05/10/2016 10:00 05/17/2016 11:43 05/18/2016 05:55 05/19/2016 06:30 05/20/2016 06:15 05/24/2016 13:17 06/07/2016 13:47 06/14/2016 08:25 06/18/2016 12:00 06/21/2016 11:31 06/28/2016 08:22 07/12/2016 14:05 07/19/2016 14:13 02/28/2017 11:25 03/28/2017 13:39 05/09/2017 13:57 05/30/2017 10:18 06/21/2017 11:45  Kappa free light chain Latest Ref Range: 3.3 - 19.4 mg/L                  136.9 (H)    142.7 (H)   56.9 (H) 56.3 (H) 78.7 (H) 92.6 (H) 114.6 (H)  Lamda free light chains Latest Ref Range: 5.7 - 26.3 mg/L                  23.4    24.4   10.5 9.0 12.4 9.5 9.2  Kappa, lamda light chain ratio Latest Ref Range: 0.26 - 1.65                   5.85 (H)    5.85 (H)   5.42 (H) 6.26 (H) 6.35 (H) 9.75 (H) 12.46 (H)  M Protein SerPl Elph-Mcnc Latest Ref Range: Not Observed g/dL                  0.7 (H)       0.2 (H) 0.4 (H) 0.4 (H) 0.5 (H) 0.4 (H)       ASSESSMENT & PLAN:   Multiple myeloma in relapse (HCC) Multiple myeloma M protein- 3.1 g/dL; kappa/lambda ratio 140; multiple bone lesions [sep 2017]. Status post RVD [poor tolerance]; status post Pom-Dex [stopped in  June 2018]- secondary to poor tolerance.  # Most recent myeloma workup DEC 2018--shows biochemical recurrence. [ M- protein 01/19/2017 [[place]]- 0.5; kappa light chains  52; ratio 0.5;DEC  2018- M protein 0.4; kappa light chains 114; ratio=12; normal renal/calcium; hemoglobin-11; platelets normal].  Given the poor tolerance to previous therapies/reluctance-hold off any treatment at this time.  # "Low-grade fevers"-unclear etiology.  Given possible right lower lobe infiltrate-recommend trial of Keflex for 10 days.   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  Stable.  # Multiple bone lesions -proceed with Zometa; reduce the dose to 52m IV-every 3 months.  #  I spoke to patient's son kShellee [phone] above plan he agrees.  [place] of [place]; no further follow-ups recommended at this time.   All questions were answered. The patient knows to call the clinic with any problems, questions or concerns.    GCammie Sickle MD 06/27/2017 4:12 PM

## 2017-06-27 NOTE — Assessment & Plan Note (Addendum)
Multiple myeloma M protein- 3.1 g/dL; kappa/lambda ratio 140; multiple bone lesions [sep 2017]. Status post RVD [poor tolerance]; status post Pom-Dex [stopped in June 2018]- secondary to poor tolerance.  # Most recent myeloma workup DEC 2018--shows biochemical recurrence. [ M- protein 01/19/2017 [[place]]- 0.5; kappa light chains 52; ratio 0.5;DEC  2018- M protein 0.4; kappa light chains 114; ratio=12; normal renal/calcium; hemoglobin-11; platelets normal].  Given the poor tolerance to previous therapies/reluctance-hold off any treatment at this time.  # "Low-grade fevers"-unclear etiology.  Given possible right lower lobe infiltrate-recommend trial of Keflex for 10 days.  Clinically do not suspect malaria.  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  Stable.  # Multiple bone lesions -proceed with Zometa; reduce the dose to 71m IV-every 3 months.  #  I spoke to patient's son kShellee [phone] above plan he agrees.  [place]; no further follow-ups recommended at this time.

## 2017-06-27 NOTE — Patient Instructions (Signed)
Ca 500 pill twice a day; along with vit D every day

## 2017-07-18 ENCOUNTER — Encounter (HOSPITAL_BASED_OUTPATIENT_CLINIC_OR_DEPARTMENT_OTHER): Payer: Self-pay

## 2017-07-18 ENCOUNTER — Encounter (HOSPITAL_BASED_OUTPATIENT_CLINIC_OR_DEPARTMENT_OTHER): Payer: Self-pay | Admitting: Hematology & Oncology

## 2017-07-18 ENCOUNTER — Encounter: Payer: Self-pay | Admitting: Hospital

## 2017-07-18 DIAGNOSIS — C9000 Multiple myeloma not having achieved remission: Secondary | ICD-10-CM

## 2017-08-14 ENCOUNTER — Encounter: Payer: Self-pay | Admitting: Hospital

## 2017-08-15 ENCOUNTER — Ambulatory Visit: Payer: Medicare Other | Attending: Hematology & Oncology | Admitting: Hematology & Oncology

## 2017-08-15 ENCOUNTER — Other Ambulatory Visit (HOSPITAL_BASED_OUTPATIENT_CLINIC_OR_DEPARTMENT_OTHER): Payer: Medicare Other

## 2017-08-15 VITALS — BP 126/82 | HR 92 | Temp 98.1°F | Resp 16 | Ht 66.0 in | Wt 104.8 lb

## 2017-08-15 DIAGNOSIS — C9000 Multiple myeloma not having achieved remission: Secondary | ICD-10-CM

## 2017-08-15 DIAGNOSIS — R7989 Other specified abnormal findings of blood chemistry: Secondary | ICD-10-CM

## 2017-08-15 LAB — CBC WITH DIFF, BLOOD
ANC-Automated: 2.1 10*3/uL (ref 1.6–7.0)
ANC-Instrument: 2.1 10*3/uL (ref 1.6–7.0)
Abs Basophils: 0 10*3/uL (ref <0.1)
Abs Eosinophils: 0.1 10*3/uL (ref 0.1–0.5)
Abs Lymphs: 1.6 10*3/uL (ref 0.8–3.1)
Abs Monos: 0.4 10*3/uL (ref 0.2–0.8)
Basophils: 1 %
Eosinophils: 2 %
Hct: 32.1 % — ABNORMAL LOW (ref 40.0–50.0)
Hgb: 10.8 gm/dL — ABNORMAL LOW (ref 13.7–17.5)
Lymphocytes: 37 %
MCH: 29.9 pg (ref 26.0–32.0)
MCHC: 33.6 g/dL (ref 32.0–36.0)
MCV: 88.9 um3 (ref 79.0–95.0)
MPV: 9.7 fL (ref 9.4–12.4)
Monocytes: 10 %
Plt Count: 175 10*3/uL (ref 140–370)
RBC: 3.61 10*6/uL — ABNORMAL LOW (ref 4.60–6.10)
RDW: 14.2 % — ABNORMAL HIGH (ref 12.0–14.0)
Segs: 50 %
WBC: 4.2 10*3/uL (ref 4.0–10.0)

## 2017-08-15 LAB — COMPREHENSIVE METABOLIC PANEL, BLOOD
ALT (SGPT): 9 U/L (ref 0–41)
AST (SGOT): 17 U/L (ref 0–40)
Albumin: 3.9 g/dL (ref 3.5–5.2)
Alkaline Phos: 70 U/L (ref 40–129)
Anion Gap: 6 mmol/L — ABNORMAL LOW (ref 7–15)
BUN: 19 mg/dL (ref 8–23)
Bicarbonate: 29 mmol/L (ref 22–29)
Bilirubin, Tot: 0.28 mg/dL (ref <1.2)
Calcium: 9.1 mg/dL (ref 8.5–10.6)
Chloride: 103 mmol/L (ref 98–107)
Creatinine: 0.94 mg/dL (ref 0.67–1.17)
GFR: >60 mL/min
Glucose: 110 mg/dL — ABNORMAL HIGH (ref 70–99)
Potassium: 4 mmol/L (ref 3.5–5.1)
Sodium: 138 mmol/L (ref 136–145)
Total Protein: 6.7 g/dL (ref 6.0–8.0)

## 2017-08-15 LAB — KAPPA LAMBDA FREE LIGHT CHAIN W/ RATIO, BLOOD
Kappa Free Light Chains Quant: 240 mg/L — ABNORMAL HIGH (ref 3.3–19.4)
Kappa:Lambda Free Light Chain Ratio: 23.08 — ABNORMAL HIGH (ref 0.26–1.65)
Lambda Free Light Chains Quant: 10.4 mg/L (ref 5.7–26.3)

## 2017-08-15 LAB — LDH, BLOOD: LDH: 191 U/L — ABNORMAL HIGH (ref 25–175)

## 2017-08-15 LAB — IMMUNOGLOBULIN PANEL (IGA,IGG,IGM), BLOOD
IGA: 55 mg/dL — ABNORMAL LOW (ref 70–400)
IGG: 1417 mg/dL (ref 700–1600)
IGM: 13 mg/dL — ABNORMAL LOW (ref 40–230)

## 2017-08-15 MED ORDER — CALCIUM CARBONATE-VITAMIN D 600-400 MG-UNIT OR TABS: 1.00 | ORAL_TABLET | Freq: Two times a day (BID) | ORAL | Status: AC

## 2017-08-15 MED ORDER — B-12 100-5000 MCG SL SUBL: SUBLINGUAL_TABLET | SUBLINGUAL | Status: AC

## 2017-08-15 NOTE — Interdisciplinary (Signed)
Wellbeing screening assessment reviewed, patient declined printed materials or referrals at this time. Will refer to social work if needed

## 2017-08-15 NOTE — Patient Instructions (Addendum)
PLAN:  1. Return to clinic with labs in 6 weeks.    CASE MANAGER:  Shela Nevin, RN     Phone # [phone]  Fax # [phone]  Pager # [phone]    After office hours MD on call line: [phone]. Ask to speak with BMT on call doctor     **Call to schedule, cancel, or reschedule clinic appointments: [phone]    [place]: [phone]  Reader: [phone]  Social Worker Lamonte Sakai): [phone]

## 2017-08-16 ENCOUNTER — Telehealth (HOSPITAL_BASED_OUTPATIENT_CLINIC_OR_DEPARTMENT_OTHER): Payer: Self-pay

## 2017-08-16 LAB — EPG, SERUM
A/G Ratio, EPG: 1.09
Albumin, EPG: 3.49 gm/dL (ref 3.20–5.00)
Alpha 1, EPG: 0.18 gm/dL (ref 0.10–0.40)
Alpha 2, EPG: 0.57 gm/dL — ABNORMAL LOW (ref 0.60–1.10)
Beta, EPG: 0.91 gm/dL (ref 0.60–1.30)
Gamma, EPG: 1.55 gm/dL — ABNORMAL HIGH (ref 0.70–1.50)
M-Spike 1 (Gamma): 1.09 gm/dL — ABNORMAL HIGH (ref 0.00–0.01)
Total Protein, EPG: 6.7 gm/dL (ref 6.00–8.00)

## 2017-08-16 LAB — IMMUNOFIXATION, BLOOD

## 2017-08-16 NOTE — Telephone Encounter (Signed)
Called and spoke with pt's son, [name]. Reviewed lab results from yesterday 2/5 showing signs of biochemical relapse.   Let him know that given hemoglobin and creatinine are stable, and if the prior PET/CT truly only showed old myeloma lesions (awaiting report from Dr. Rogue Bussing), Dr. Brand Males is ok watching and waiting given how bad he felt on therapy previoisly. Discussed possibly repeating PET/CT in 1-2 m.If any new lesions noted on that PET/CT, we'd have to think about starting tx. Understanding verbalized. No further questions at time of call.

## 2017-08-17 LAB — IMMUNOFIX INTERPRETATION, SERUM

## 2017-08-17 LAB — EPG, INTERPRETATION, SERUM

## 2017-08-17 NOTE — Progress Notes (Signed)
BONE MARROW TRANSPLANT      Diagnosis: Multiple myeloma    Primary Care Physician: Bobette Mo, MD  Oncologist: Dr. Charlaine Dalton, MD    Myeloma Data at Diagnosis:  Date of diagnosis:   Age: 72  Hgb:   Creat:   Alb:   Calcium:   B2M: 2.5  Bone marrow plasma cell %:   Cytogenetics/FISH:  Skeletal survey:    Free light chains: kappa 1005.1/lambda 7.1; ratio 141.56   SPEP/ M-protein: 3.1  IFE: IgG kappa  R-ISS:  ISS: unknown  Durie-Salmon:    Interval History:  Since his last appt - he went to [facility] for a yoga conference.  He and his wife and their sons and daughter also went.  He and his wife remained there from August till January in [place].  He followed with Dr. Rogue Bussing there.  M-spike was reportedly stable and he had a PET/CT in October which showed per [place] old myelomatous lesions only    He's feeling great. Much better strength - much better appetite.  He's walking w/o needing his wheelchair even for distances.  His breathing is good - no longer has the SOB going up stairs.  He's gained weight.    Oncologic History:  Mr. 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  He had an abnl chest X-ray which lead to a chest CT which noted multiple lytic lesions in thoracic vertebrae, as well as compression fractures.  He was referred to Dr. Charlaine Dalton for further evaluation.  He had a bone marrow biopsy that showed MM.  He had significant pain from his spine lesions and had palliative radiation from 03/24/16-04/11/16 from T9-T11 and exophytic masses.  His pain resolved once he finished radiation therapy.      He was then started on Revlimid 20 mg po daily 2 weeks on 1 week off as well as Velcade SQ and dexamethasone    He had several hospitalizations for infection, difficulty with weight gain, eating.  He was placed on TPN and then transitioned to tube feeds by 11/17.      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  At this point, he's off of dexamethasone.  He is taking Revlimid 5 mg po daily 1 week on 1 week off.    Given his significant debilitation and illness, he moved to [facility] to have more support/caregiving.  He and his wife have moved in with their son. Children:  Sons: [name] and [name] in [place].   Daughter: Anju    Hospitalizations and significant events:   9/26-10/4: Admitted with N/V and failure to thrive, felt to have gastritis  10/16-10/25: Admitted with aspiration pneumonia; tx with vanc/zosyn/azithro  05/18/16: PEG tube placed  06/18/16: ER visit for abdominal pain at PEG site    07/2016: Attempted lenalidomide 10 mg (was on 5 mg every other week prior to moving).  Did not tolerate so went to 10 mg every other day until complete  08/2016: Changed to pom/dex with pomalidomide 4 mg po daily days 1-21 and dex 20 mg po weekly.  Tolerance was about the same as lenalidomide.  But M-spike responded.  09/2016: Took him off of pom/dex after only 1 cycle due to significant frailty and no improvement with improvement in myeloma.  Not clear pom/dex is actually what is causing his debilitation/frailty/failure to thrive, but feel  like he is too frail to consider continued therapy when myeloma has responded.      Review of Systems:  General: No fever, chills.  +fatigue improving  Eyes: No icterus, vision change, double vision, eye pain, blurry vision, or floaters.   Oral/throat: No oral pain, oral ulcers or lesions, tooth pain, sore throat.   Nose: No nasal discharge, sinus pain.   Ears: No change in hearing or ear pain.   Neck: No pain, no masses.  Cardiac: No chest pain or pressure, palpitations, or edema.   Pulmonary: No chest pain, SOB resolved  Abdomen: No pain, bloating, nausea resolved, diarrhea   Genitourinary: No dysuria, change in urinary frequency.   Extremities: No swelling, pain +numbness in feet, also fingertips.   Skin: No rash, lesions, pruritus.   Neurologic:  No headache,  numbness  Psych: Normal mood     Past Medical/Surgical Hx:   1. Multiple myeloma  2. Retinal detachment 2006    Social History:   He is married. He and his wife moved from [place] to the [place] in 2002.  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  Since 2010 he's been very involved with an ashram in [place], [place].  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    He denies EtOH/never smoker/no smokeless tobacco.  He is vegetarian  He has 3 children, 2 sons and 1 daughter    Family History:   He has no known family h/o cancer.  Father died when patient was only 3 mos but he is not aware of cause    Medications:    Current Outpatient Medications:     calcium-vitamin D (CALTRATE 600+D) 600-400 MG-UNIT tablet, Take 1 tablet by mouth 2 times daily., Disp: , Rfl:     Cobalamine Combinations (B-12) [phone] MCG SUBL, , Disp: , Rfl:     cyanocobalamin ER 1000 MCG tablet, Take 1 tablet (1,000 mcg) by mouth daily., Disp: 30 tablet, Rfl: 3    Allergies:  Patient has no known allergies.    Objective:   08/15/17  1144   BP: 126/82   Pulse: 92   Resp: 16   Temp: 98.1 F (36.7 C)   SpO2: 100%       Physical Exam:   Karnofsky performance status: 70  General: Thin, chronically ill appearing male, no distress    HEENT: EOMI, PERRL, sclera anicteric, conjunctiva pink and moist, oral cavity without lesions or ulcers, tonsils normal   Neck: Supple, no lymphadenopathy   Lungs: Clear to auscultation bilaterally, no wheezes, rubs, or rales.    Cardiac: No JVD, tachycardic, normal rhythm, normal S1, S2, no murmurs or gallops   Abdomen: Not distended, normal bowel sounds, soft, non-tender, no organomegaly   Extremities: Warm, well perfused, no cyanosis, no clubbing, no edema   Skin: No jaundice, no petechiae, no purpura   Lymph: No lymphadenopathy at cervical, supraclavicular, axillary, or inguinal lymph nodes   Neurologic: Awake, alert, oriented.  +BLE weakness R>L    Lab results:  Lab Results    Component Value Date    WBC 4.2 08/15/2017    RBC 3.61 (L) 08/15/2017    HGB 10.8 (L) 08/15/2017    HCT 32.1 (L) 08/15/2017    MCV 88.9 08/15/2017    MCHC 33.6 08/15/2017    RDW 14.2 (H) 08/15/2017    PLT 175 08/15/2017    MPV 9.7 08/15/2017    SEG 50 08/15/2017  LYMPHS 37 08/15/2017    MONOS 10 08/15/2017    EOS 2 08/15/2017    BASOS 1 08/15/2017     Lab Results   Component Value Date    NA 138 08/15/2017    K 4.0 08/15/2017    CL 103 08/15/2017    BICARB 29 08/15/2017    BUN 19 08/15/2017    CREAT 0.94 08/15/2017    GLU 110 (H) 08/15/2017    [place] 9.1 08/15/2017     Lab Results   Component Value Date    AST 17 08/15/2017    ALT 9 08/15/2017    LDH 191 (H) 08/15/2017    ALK 70 08/15/2017    TP 6.7 08/15/2017    ALB 3.9 08/15/2017    TBILI 0.28 08/15/2017     Results for Alan [name], Alan [name] (MRN [id number]) as of 08/17/2017 13:43   Ref. Range 08/24/2016 15:33 09/19/2016 13:00 10/21/2016 11:15 12/20/2016 10:56 01/19/2017 11:00 08/15/2017 10:04   M-Spike 1 (Gamma) Latest Ref Range: 0.00 - 0.01 gm/dL   0.15 (H) 0.48 (H) 0.52 (H) 1.09 (H)   Total Protein, EPG Latest Ref Range: 6.00 - 8.00 gm/dL 7.20 6.40 5.90 (L) 6.60 6.10 6.70   GFR Latest Units: mL/min >60 >60 >60 >60 >60 >60   IGA Latest Ref Range: 70 - 400 mg/dL 68 (L) 97 80 109 83 55 (L)   IGG Latest Ref Range: 700 - 1,600 mg/dL 1,242 826 754 1,222 1,104 1,417   IGM Latest Ref Range: 40 - 230 mg/dL 26 (L) 18 (L) 20 (L) 16 (L) 11 (L) 13 (L)   Kappa Free Light Chains Quant Latest Ref Range: 3.3 - 19.4 mg/L 72.5 (H) 30.1 (H) 45.4 (H) 54.8 (H) 51.9 (H) 240.0 (H)   Lambda Free Light Chains Quant Latest Ref Range: 5.7 - 26.3 mg/L 12.5 11.2 16.8 11.1 9.3 10.4   Kappa:Lambda Free Light Chain Ratio Latest Ref Range: 0.26 - 1.65  5.80 (H) 2.69 (H) 2.70 (H) 4.94 (H) 5.58 (H) 23.08 (H)       Pathology:  OSH BMBx 03/25/16  1. Plasma cell neoplasm with mildy atypical plasma cells (>90% of the sampled marrow in the clot sections)  2. Abnormal FISH result: CCND1/IGH translocation with  aneuploidy - t(11;14).  Aneuploid for all probes.  No evidence of t(4;14) or t(14;16)  3. Abnormal SNP microarray result, see separate report for details    Karyotype: 46,XY[20]  MM FISH: CCND1/IGH translocation with aneuploidy - t(11;14)    ASSESSMENT AND PLAN:  72 yo M with IgG kappa multiple myeloma.  He has been on therapy with RVD, but due to multiple hospitalizations, has been unable to tolerate full dose therapy.  We started on pomalidomide 4 mg po days 1-21 along with weekly dexamethasone 20 mg but stopped therapy after a month (M-spike dropped nicely but did have some sx, not clear if attributable to pom/dex as continues with them: nausea, decreased appetite)    #IgG kappa MM:   Biochemical relapse - but he's been very symptomatic on tx before and has no CRAB criteria right now. Low hgb, but this is baseline (even when MM controlled)   Check PET/CT in 1-2 mos, observe M-spike, creatinine and hgb. If any CRAB criteria, consider therapy, though consider something w/o imid or Velcade - does have t(11;14) and could also consider Venetoclax for convenience. Tonette Bihari also quite reasonable but he lives far.    #B12 deficiency:   Per outside records previously deficient, tx with B12 injections,  now appears resolved   Monitor B12, likely continue oral supplements    #Debilitation:   Much improved    Plan:  Oral vitamin B12 repletion  Does have signs/sx of biochemical progression but anemia/renal fxn stable. Reportedly negative PET/CT for new myelomatous lesions 10/18 in [place].   Given his significant sx on myeloma therapy, can wait to start tx - continue to watch hgb, creatinine and assess for new bone pain.  Plan PET/CT in 1 month, if any CRAB criteria by then, resume therapy - though can discuss other potential agents  RTC in 6 weeks

## 2017-09-06 ENCOUNTER — Encounter (HOSPITAL_BASED_OUTPATIENT_CLINIC_OR_DEPARTMENT_OTHER): Payer: Self-pay | Admitting: Hematology & Oncology

## 2017-10-01 NOTE — Progress Notes (Signed)
BONE MARROW TRANSPLANT      Diagnosis: Multiple myeloma    Primary Care Physician: Bobette Mo, MD  Oncologist: Dr. Charlaine Dalton, MD    Myeloma Data at Diagnosis:  Date of diagnosis:   Age: 72  Hgb:   Creat:   Alb:   Calcium:   B2M: 2.5  Bone marrow plasma cell %:   Cytogenetics/FISH:  Skeletal survey:    Free light chains: kappa 1005.1/lambda 7.1; ratio 141.56   SPEP/ M-protein: 3.1  IFE: IgG kappa  R-ISS:  ISS: unknown  Durie-Salmon:    Interval History:  Since his last appt - he has been admitted twice to [facility].  The first time was dude to fractured femur after a fall and he had a left trochanteric femoral nail fixation by Dr. Dorise Hiss on 08/24/17, d/c on 08/28/17.  The second admission from 09/04/17-09/08/17 was for a pneumonia, he was treated with broad spectrum abx, vanc/cefe then d/c on Augmentin  Currently he is walking with a walker at home, and starting to get his strength back.  He's feeling weaker from his hospital stays.  No further SOB, fevers since d/c    Oncologic History:  Mr. Enfield is a 72 yo M diagnosed with IgG kappa MM 9/17 when he presented with back pain, weight loss.  He had an abnl chest X-ray which lead to a chest CT which noted multiple lytic lesions in thoracic vertebrae, as well as compression fractures.  He was referred to Dr. Charlaine Dalton for further evaluation.  He had a bone marrow biopsy that showed MM.  He had significant pain from his spine lesions and had palliative radiation from 03/24/16-04/11/16 from T9-T11 and exophytic masses.  His pain resolved once he finished radiation therapy.      He was then started on Revlimid 20 mg po daily 2 weeks on 1 week off as well as Velcade SQ and dexamethasone    He had several hospitalizations for infection, difficulty with weight gain, eating.  He was placed on TPN and then transitioned to tube feeds by 11/17.      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  At this point,  he's off of dexamethasone.  He is taking Revlimid 5 mg po daily 1 week on 1 week off.    Given his significant debilitation and illness, he moved to [facility] to have more support/caregiving.  He and his wife have moved in with their son. Children:  Sons: [name] and [name] in [place].   Daughter: Anju    Hospitalizations and significant events:   9/26-10/4: Admitted with N/V and failure to thrive, felt to have gastritis  10/16-10/25: Admitted with aspiration pneumonia; tx with vanc/zosyn/azithro  05/18/16: PEG tube placed  06/18/16: ER visit for abdominal pain at PEG site    07/2016: Attempted lenalidomide 10 mg (was on 5 mg every other week prior to moving).  Did not tolerate so went to 10 mg every other day until complete  08/2016: Changed to pom/dex with pomalidomide 4 mg po daily days 1-21 and dex 20 mg po weekly.  Tolerance was about the same as lenalidomide.  But M-spike responded.  09/2016: Took him off of pom/dex after only 1 cycle due to significant frailty and no improvement with improvement in myeloma.  Not clear pom/dex is actually what is causing his debilitation/frailty/failure to thrive, but feel like he is too frail to consider continued therapy when myeloma has  responded.      Review of Systems:  General: No fever, chills.  +fatigue improving  Eyes: No icterus, vision change, double vision, eye pain, blurry vision, or floaters.   Oral/throat: No oral pain, oral ulcers or lesions, tooth pain, sore throat.   Nose: No nasal discharge, sinus pain.   Ears: No change in hearing or ear pain.   Neck: No pain, no masses.  Cardiac: No chest pain or pressure, palpitations, or edema.   Pulmonary: No chest pain, SOB resolved  Abdomen: No pain, bloating, nausea resolved, diarrhea   Genitourinary: No dysuria, change in urinary frequency.   Extremities: No swelling, pain +numbness in feet, also fingertips.   Skin: No rash, lesions, pruritus.   Neurologic:  No headache, numbness  Psych: Normal mood     Past  Medical/Surgical Hx:   1. Multiple myeloma  2. Retinal detachment 2006    Social History:   He is married. He and his wife moved from [place] to the [place] in 2002.  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    He denies EtOH/never smoker/no smokeless tobacco.  He is vegetarian  He has 3 children, 2 sons and 1 daughter    Family History:   He has no known family h/o cancer.  Father died when patient was only 3 mos but he is not aware of cause    Medications:    Current Outpatient Medications:     calcium-vitamin D (CALTRATE 600+D) 600-400 MG-UNIT tablet, Take 1 tablet by mouth 2 times daily., Disp: , Rfl:     Cobalamine Combinations (B-12) [phone] MCG SUBL, , Disp: , Rfl:     cyanocobalamin ER 1000 MCG tablet, Take 1 tablet (1,000 mcg) by mouth daily., Disp: 30 tablet, Rfl: 3    pantoprazole (PROTONIX) 40 MG packet, USE ONE PACKET AS DIRECTED AND DRINK DAILY., Disp: 30 each, Rfl: 2    Allergies:  Patient has no known allergies.    Objective:   10/02/17  1501   BP: 112/73   Pulse: 99   Resp: 15   Temp: 98.1 F (36.7 C)   SpO2: 98%       Physical Exam:   Karnofsky performance status: 70  General: Thin, chronically ill appearing male, no distress    HEENT: EOMI, PERRL, sclera anicteric, conjunctiva pink and moist, oral cavity without lesions or ulcers, tonsils normal   Neck: Supple, no lymphadenopathy   Lungs: Clear to auscultation bilaterally, no wheezes, rubs, or rales.    Cardiac: No JVD, tachycardic, normal rhythm, normal S1, S2, no murmurs or gallops   Abdomen: Not distended, normal bowel sounds, soft, non-tender, no organomegaly   Extremities: Warm, well perfused, no cyanosis, no clubbing, no edema   Skin: No jaundice, no petechiae, no purpura   Lymph: No lymphadenopathy at cervical, supraclavicular, axillary, or inguinal lymph nodes   Neurologic: Awake,  alert, oriented.  +BLE weakness R>L    Lab results:  Lab Results   Component Value Date    WBC 4.2 08/15/2017    RBC 3.61 (L) 08/15/2017    HGB 10.8 (L) 08/15/2017    HCT 32.1 (L) 08/15/2017    MCV 88.9 08/15/2017    MCHC 33.6 08/15/2017    RDW 14.2 (H) 08/15/2017    PLT 175 08/15/2017    MPV  9.7 08/15/2017    SEG 50 08/15/2017    LYMPHS 37 08/15/2017    MONOS 10 08/15/2017    EOS 2 08/15/2017    BASOS 1 08/15/2017     Lab Results   Component Value Date    NA 138 08/15/2017    K 4.0 08/15/2017    CL 103 08/15/2017    BICARB 29 08/15/2017    BUN 19 08/15/2017    CREAT 0.94 08/15/2017    GLU 110 (H) 08/15/2017     9.1 08/15/2017     Lab Results   Component Value Date    AST 17 08/15/2017    ALT 9 08/15/2017    LDH 191 (H) 08/15/2017    ALK 70 08/15/2017    TP 6.7 08/15/2017    ALB 3.9 08/15/2017    TBILI 0.28 08/15/2017     Results for TAEQUAN, STOCKHAUSEN (MRN [id number]) as of 08/17/2017 13:43   Ref. Range 08/24/2016 15:33 09/19/2016 13:00 10/21/2016 11:15 12/20/2016 10:56 01/19/2017 11:00 08/15/2017 10:04   M-Spike 1 (Gamma) Latest Ref Range: 0.00 - 0.01 gm/dL   0.15 (H) 0.48 (H) 0.52 (H) 1.09 (H)   Total Protein, EPG Latest Ref Range: 6.00 - 8.00 gm/dL 7.20 6.40 5.90 (L) 6.60 6.10 6.70   GFR Latest Units: mL/min >60 >60 >60 >60 >60 >60   IGA Latest Ref Range: 70 - 400 mg/dL 68 (L) 97 80 109 83 55 (L)   IGG Latest Ref Range: 700 - 1,600 mg/dL 1,242 826 754 1,222 1,104 1,417   IGM Latest Ref Range: 40 - 230 mg/dL 26 (L) 18 (L) 20 (L) 16 (L) 11 (L) 13 (L)   Kappa Free Light Chains Quant Latest Ref Range: 3.3 - 19.4 mg/L 72.5 (H) 30.1 (H) 45.4 (H) 54.8 (H) 51.9 (H) 240.0 (H)   Lambda Free Light Chains Quant Latest Ref Range: 5.7 - 26.3 mg/L 12.5 11.2 16.8 11.1 9.3 10.4   Kappa:Lambda Free Light Chain Ratio Latest Ref Range: 0.26 - 1.65  5.80 (H) 2.69 (H) 2.70 (H) 4.94 (H) 5.58 (H) 23.08 (H)     09/07/17 at [facility]  hgb 8.6  Creatinine 0.68  Calcium 7.8  IgG 1428    Pathology:  OSH BMBx 03/25/16  1. Plasma cell neoplasm with mildy  atypical plasma cells (>90% of the sampled marrow in the clot sections)  2. Abnormal FISH result: CCND1/IGH translocation with aneuploidy - t(11;14).  Aneuploid for all probes.  No evidence of t(4;14) or t(14;16)  3. Abnormal SNP microarray result, see separate report for details    Karyotype: 46,XY[20]  MM FISH: CCND1/IGH translocation with aneuploidy - t(11;14)    ASSESSMENT AND PLAN:  72 yo M with IgG kappa multiple myeloma.  He has been on therapy with RVD, but due to multiple hospitalizations, has been unable to tolerate full dose therapy.  We started on pomalidomide 4 mg po days 1-21 along with weekly dexamethasone 20 mg but stopped therapy after a month (M-spike dropped nicely but did have some sx, not clear if attributable to pom/dex as continues with them: nausea, decreased appetite)    #IgG kappa MM:   Biochemical relapse - but he's been very symptomatic on tx before and has no CRAB criteria right now. Low hgb, but this is baseline (even when MM controlled)   Check PET/CT now. I am concerned his MM is symptmoatic now, with progression of his anemia. I'd like a baseline scan, and then to start tx with daratumumab.  I favor  dara as both len and pom caused significant side effects (loss of appeite with major weight loss, SOB?? Weakness) and bortezomib led to significant neuropathy.  I do not wish to try carfilzomib given his prior months of dyspnea and tachypnea.      #B12 deficiency:   Per outside records previously deficient, tx with B12 injections, now appears resolved   Monitor B12, likely continue oral supplements    #Debilitation:   Much improved    Plan:  Repeat PET/CT now  Request auth and consent for daratumumab   RTC in 4 weeks    Prefers f/u on Fridays

## 2017-10-02 ENCOUNTER — Other Ambulatory Visit (HOSPITAL_BASED_OUTPATIENT_CLINIC_OR_DEPARTMENT_OTHER): Payer: Medicare Other

## 2017-10-02 ENCOUNTER — Ambulatory Visit: Payer: Medicare Other | Attending: Hematology & Oncology | Admitting: Hematology & Oncology

## 2017-10-02 VITALS — BP 112/73 | HR 99 | Temp 98.1°F | Resp 15 | Ht 66.0 in | Wt 95.8 lb

## 2017-10-02 DIAGNOSIS — E538 Deficiency of other specified B group vitamins: Secondary | ICD-10-CM | POA: Insufficient documentation

## 2017-10-02 DIAGNOSIS — C9000 Multiple myeloma not having achieved remission: Secondary | ICD-10-CM

## 2017-10-02 DIAGNOSIS — R7989 Other specified abnormal findings of blood chemistry: Secondary | ICD-10-CM

## 2017-10-02 DIAGNOSIS — D638 Anemia in other chronic diseases classified elsewhere: Secondary | ICD-10-CM | POA: Insufficient documentation

## 2017-10-02 LAB — KAPPA LAMBDA FREE LIGHT CHAIN W/ RATIO, BLOOD
Kappa Free Light Chains Quant: 277.3 mg/L — ABNORMAL HIGH (ref 3.3–19.4)
Kappa:Lambda Free Light Chain Ratio: 31.51 — ABNORMAL HIGH (ref 0.26–1.65)
Lambda Free Light Chains Quant: 8.8 mg/L (ref 5.7–26.3)

## 2017-10-02 LAB — COMPREHENSIVE METABOLIC PANEL, BLOOD
ALT (SGPT): 10 U/L (ref 0–41)
AST (SGOT): 17 U/L (ref 0–40)
Albumin: 3.8 g/dL (ref 3.5–5.2)
Alkaline Phos: 112 U/L (ref 40–129)
Anion Gap: 9 mmol/L (ref 7–15)
BUN: 17 mg/dL (ref 8–23)
Bicarbonate: 31 mmol/L — ABNORMAL HIGH (ref 22–29)
Bilirubin, Tot: 0.32 mg/dL (ref <1.2)
Calcium: 9.5 mg/dL (ref 8.5–10.6)
Chloride: 104 mmol/L (ref 98–107)
Creatinine: 0.83 mg/dL (ref 0.67–1.17)
GFR: >60 mL/min
Glucose: 123 mg/dL — ABNORMAL HIGH (ref 70–99)
Potassium: 3.4 mmol/L — ABNORMAL LOW (ref 3.5–5.1)
Sodium: 144 mmol/L (ref 136–145)
Total Protein: 6.9 g/dL (ref 6.0–8.0)

## 2017-10-02 LAB — CBC WITH DIFF, BLOOD
ANC-Automated: 2.2 10*3/uL (ref 1.6–7.0)
ANC-Instrument: 2.2 10*3/uL (ref 1.6–7.0)
Abs Basophils: 0 10*3/uL (ref <0.1)
Abs Eosinophils: 0 10*3/uL (ref 0.1–0.5)
Abs Lymphs: 1.5 10*3/uL (ref 0.8–3.1)
Abs Monos: 0.4 10*3/uL (ref 0.2–0.8)
Basophils: 1 %
Eosinophils: 1 %
Hct: 32.4 % — ABNORMAL LOW (ref 40.0–50.0)
Hgb: 10 gm/dL — ABNORMAL LOW (ref 13.7–17.5)
Lymphocytes: 36 %
MCH: 29 pg (ref 26.0–32.0)
MCHC: 30.9 g/dL — ABNORMAL LOW (ref 32.0–36.0)
MCV: 93.9 um3 (ref 79.0–95.0)
MPV: 10.4 fL (ref 9.4–12.4)
Monocytes: 8 %
Plt Count: 181 10*3/uL (ref 140–370)
RBC: 3.45 10*6/uL — ABNORMAL LOW (ref 4.60–6.10)
RDW: 16.1 % — ABNORMAL HIGH (ref 12.0–14.0)
Segs: 53 %
WBC: 4.2 10*3/uL (ref 4.0–10.0)

## 2017-10-02 LAB — IMMUNOGLOBULIN PANEL (IGA,IGG,IGM), BLOOD
IGA: 57 mg/dL — ABNORMAL LOW (ref 70–400)
IGG: 1699 mg/dL — ABNORMAL HIGH (ref 700–1600)
IGM: 14 mg/dL — ABNORMAL LOW (ref 40–230)

## 2017-10-02 LAB — LDH, BLOOD: LDH: 200 U/L — ABNORMAL HIGH (ref 25–175)

## 2017-10-02 LAB — VITAMIN B12, BLOOD: Vitamin B12: 1244 pg/mL (ref 232–1245)

## 2017-10-02 NOTE — Patient Instructions (Addendum)
PLAN:  1. Plan to start Daratumab Friday 4/5.  2. PET scan ordered. Please call to schedule for 4/5.  3. BMT pharmacist to call review plan once scheduled.  4. Return to clinic 4/26. OK to schedule.    CASE MANAGER:  Shela Nevin, RN     Phone # [phone]  Fax # [phone]  Pager # [phone]    After office hours MD on call line: [phone]. Ask to speak with BMT on call doctor     **Call to schedule, cancel, or reschedule clinic appointments: [phone]    [place]: [phone]  [place]: [phone]  Social Worker Lamonte Sakai): [phone]

## 2017-10-03 ENCOUNTER — Encounter: Payer: Self-pay | Admitting: Internal Medicine

## 2017-10-03 ENCOUNTER — Encounter (HOSPITAL_BASED_OUTPATIENT_CLINIC_OR_DEPARTMENT_OTHER): Payer: Self-pay | Admitting: Hospital

## 2017-10-03 ENCOUNTER — Telehealth (HOSPITAL_BASED_OUTPATIENT_CLINIC_OR_DEPARTMENT_OTHER): Payer: Self-pay | Admitting: Hematology & Oncology

## 2017-10-03 LAB — EPG, SERUM
A/G Ratio, EPG: 0.95
Albumin, EPG: 3.36 gm/dL (ref 3.20–5.00)
Alpha 1, EPG: 0.23 gm/dL (ref 0.10–0.40)
Alpha 2, EPG: 0.63 gm/dL (ref 0.60–1.10)
Beta, EPG: 0.92 gm/dL (ref 0.60–1.30)
Gamma, EPG: 1.75 gm/dL — ABNORMAL HIGH (ref 0.70–1.50)
M-Spike 1 (Gamma): 1.03 gm/dL — ABNORMAL HIGH (ref 0.00–0.01)
Total Protein, EPG: 6.9 gm/dL (ref 6.00–8.00)

## 2017-10-03 LAB — IMMUNOFIXATION, BLOOD

## 2017-10-03 NOTE — Telephone Encounter (Signed)
Called pt's son, [name], r/t mychart conversations. Reviewed that we will hold off on PET for now. Discussed with [name] about starting tx with daratumumab here at [facility] and delaying returning to [place] for a couple of months, vs returning to [place] with Dr. Rogue Bussing on April 10th as originally planned. Explained to [name] that if pt does decide to return to [facility] on April 10th, it would make more sense to begin tx once he is in [place], instead of receiving one dose here at [facility] [place] on 4/5. Per [name], he will discuss with his father tonight, and call back tomorrow. No further questions at time of call.

## 2017-10-03 NOTE — Telephone Encounter (Signed)
The [facility] has received your request for this new patient treatment of daratumumab+Zometa      Per your request the available appointment @ [facility] is 10/27/2017 @ 8am        If this appointment is not appropriate, please notify myself Delorise Jackson or Any of the [facility] for changes.    Thank you

## 2017-10-04 ENCOUNTER — Encounter (HOSPITAL_BASED_OUTPATIENT_CLINIC_OR_DEPARTMENT_OTHER): Payer: Self-pay | Admitting: Hospital

## 2017-10-06 ENCOUNTER — Encounter (HOSPITAL_BASED_OUTPATIENT_CLINIC_OR_DEPARTMENT_OTHER): Payer: Self-pay | Admitting: Hospital

## 2017-10-09 ENCOUNTER — Encounter (HOSPITAL_BASED_OUTPATIENT_CLINIC_OR_DEPARTMENT_OTHER): Payer: Self-pay | Admitting: Hospital

## 2017-10-10 LAB — IMMUNOFIX INTERPRETATION, SERUM

## 2017-10-10 LAB — EPG, INTERPRETATION, SERUM

## 2017-10-13 ENCOUNTER — Other Ambulatory Visit: Payer: Self-pay

## 2017-10-13 ENCOUNTER — Ambulatory Visit
Admission: RE | Admit: 2017-10-13 | Discharge: 2017-10-13 | Disposition: A | Discharge status: Home / Self Care | Payer: Medicare Other | Attending: Hematology & Oncology | Admitting: Hematology & Oncology

## 2017-10-13 ENCOUNTER — Encounter (HOSPITAL_BASED_OUTPATIENT_CLINIC_OR_DEPARTMENT_OTHER): Payer: Self-pay | Admitting: Nurse Practitioner

## 2017-10-13 VITALS — BP 120/80 | HR 113 | Temp 98.6°F | Resp 16 | Ht 62.0 in | Wt 97.0 lb

## 2017-10-13 DIAGNOSIS — E876 Hypokalemia: Principal | ICD-10-CM

## 2017-10-13 DIAGNOSIS — R7989 Other specified abnormal findings of blood chemistry: Secondary | ICD-10-CM

## 2017-10-13 DIAGNOSIS — C9000 Multiple myeloma not having achieved remission: Secondary | ICD-10-CM | POA: Insufficient documentation

## 2017-10-13 DIAGNOSIS — Z5112 Encounter for antineoplastic immunotherapy: Secondary | ICD-10-CM | POA: Insufficient documentation

## 2017-10-13 LAB — CBC WITH DIFF, BLOOD
ANC-Automated: 3.4 10*3/uL (ref 1.6–7.0)
ANC-Instrument: 3.4 10*3/uL (ref 1.6–7.0)
Abs Basophils: 0 10*3/uL (ref <0.1)
Abs Eosinophils: 0 10*3/uL (ref 0.1–0.5)
Abs Lymphs: 1.5 10*3/uL (ref 0.8–3.1)
Abs Monos: 0.3 10*3/uL (ref 0.2–0.8)
Basophils: 0 %
Eosinophils: 1 %
Hct: 30.6 % — ABNORMAL LOW (ref 40.0–50.0)
Hgb: 9.7 gm/dL — ABNORMAL LOW (ref 13.7–17.5)
Lymphocytes: 29 %
MCH: 29.5 pg (ref 26.0–32.0)
MCHC: 31.7 g/dL — ABNORMAL LOW (ref 32.0–36.0)
MCV: 93 um3 (ref 79.0–95.0)
MPV: 10.3 fL (ref 9.4–12.4)
Monocytes: 6 %
Plt Count: 184 10*3/uL (ref 140–370)
RBC: 3.29 10*6/uL — ABNORMAL LOW (ref 4.60–6.10)
RDW: 15.6 % — ABNORMAL HIGH (ref 12.0–14.0)
Segs: 64 %
WBC: 5.3 10*3/uL (ref 4.0–10.0)

## 2017-10-13 LAB — COMPREHENSIVE METABOLIC PANEL, BLOOD
ALT (SGPT): 14 U/L (ref 0–41)
AST (SGOT): 20 U/L (ref 0–40)
Albumin: 4 g/dL (ref 3.5–5.2)
Alkaline Phos: 105 U/L (ref 40–129)
Anion Gap: 10 mmol/L (ref 7–15)
BUN: 15 mg/dL (ref 8–23)
Bicarbonate: 29 mmol/L (ref 22–29)
Bilirubin, Tot: 0.3 mg/dL (ref <1.2)
Calcium: 9.1 mg/dL (ref 8.5–10.6)
Chloride: 103 mmol/L (ref 98–107)
Creatinine: 0.82 mg/dL (ref 0.67–1.17)
GFR: >60 mL/min
Glucose: 101 mg/dL — ABNORMAL HIGH (ref 70–99)
Potassium: 3.3 mmol/L — ABNORMAL LOW (ref 3.5–5.1)
Sodium: 142 mmol/L (ref 136–145)
Total Protein: 7.6 g/dL (ref 6.0–8.0)

## 2017-10-13 LAB — DIRECT COOMBS IGG/C3
Direct Coombs C3: NEGATIVE
Direct Coombs IgG: POSITIVE

## 2017-10-13 LAB — HEPATITIS B SURFACE AB, QUANT, BLOOD: HBsAb,Qt: 35 m[IU]/mL

## 2017-10-13 LAB — KAPPA LAMBDA FREE LIGHT CHAIN W/ RATIO, BLOOD
Kappa Free Light Chains Quant: 347.8 mg/L — ABNORMAL HIGH (ref 3.3–19.4)
Kappa:Lambda Free Light Chain Ratio: 41.9 — ABNORMAL HIGH (ref 0.26–1.65)
Lambda Free Light Chains Quant: 8.3 mg/L (ref 5.7–26.3)

## 2017-10-13 LAB — IMMUNOGLOBULIN PANEL (IGA,IGG,IGM), BLOOD
IGA: 57 mg/dL — ABNORMAL LOW (ref 70–400)
IGG: 2213 mg/dL — ABNORMAL HIGH (ref 700–1600)
IGM: 14 mg/dL — ABNORMAL LOW (ref 40–230)

## 2017-10-13 LAB — HEPATITIS B SURFACE AG, BLOOD: HBsAg: NONREACTIVE

## 2017-10-13 LAB — HEPATITIS B CORE AB TOTAL: HBcAb Total: REACTIVE — AB

## 2017-10-13 LAB — DIRECT COOMBS: Direct Coombs Poly: POSITIVE

## 2017-10-13 LAB — TYPE & SCREEN
ABO/RH: O POS
Antibody Screen: NEGATIVE

## 2017-10-13 LAB — ABO/RH CONFIRMATION: ABO/RH: O POS

## 2017-10-13 MED ORDER — SODIUM CHLORIDE 0.9 % IV BOLUS
250.0000 mL | INJECTION | Freq: Once | INTRAVENOUS | Status: AC
Start: 2017-10-13 — End: 2017-10-13
  Administered 2017-10-13: 250 mL via INTRAVENOUS

## 2017-10-13 MED ORDER — CETIRIZINE HCL 10 MG OR TABS
10.0000 mg | ORAL_TABLET | Freq: Once | ORAL | Status: AC
Start: 2017-10-13 — End: 2017-10-13
  Administered 2017-10-13: 10 mg via ORAL
  Filled 2017-10-13: qty 1

## 2017-10-13 MED ORDER — POTASSIUM CHLORIDE CRYS CR 10 MEQ OR TBCR
40.0000 meq | EXTENDED_RELEASE_TABLET | Freq: Once | ORAL | Status: AC
Start: 2017-10-13 — End: 2017-10-13
  Administered 2017-10-13: 40 meq via ORAL
  Filled 2017-10-13: qty 4

## 2017-10-13 MED ORDER — DEXAMETHASONE 4 MG OR TABS
ORAL_TABLET | ORAL | 0 refills | Status: AC
Start: 2017-10-13 — End: ?

## 2017-10-13 MED ORDER — MONTELUKAST SODIUM 10 MG OR TABS
10.0000 mg | ORAL_TABLET | Freq: Once | ORAL | Status: AC
Start: 2017-10-13 — End: 2017-10-13
  Administered 2017-10-13: 10 mg via ORAL
  Filled 2017-10-13: qty 1

## 2017-10-13 MED ORDER — ZOLEDRONIC ACID 4 MG/5ML IV CONC
3.5000 mg | Freq: Once | INTRAVENOUS | Status: AC
Start: 2017-10-13 — End: 2017-10-13
  Administered 2017-10-13: 3.5 mg via INTRAVENOUS
  Filled 2017-10-13: qty 4.38

## 2017-10-13 MED ORDER — ACYCLOVIR 400 MG OR TABS
400.0000 mg | ORAL_TABLET | Freq: Two times a day (BID) | ORAL | 6 refills | Status: AC
Start: 2017-10-13 — End: ?

## 2017-10-13 MED ORDER — SODIUM CHLORIDE 0.9 % IV SOLN
16.0000 mg/kg | Freq: Once | INTRAVENOUS | Status: AC
Start: 2017-10-13 — End: 2017-10-13
  Administered 2017-10-13: 700 mg via INTRAVENOUS
  Filled 2017-10-13: qty 20

## 2017-10-13 MED ORDER — ZOLEDRONIC ACID 4 MG/5ML IV CONC
4.0000 mg | Freq: Once | INTRAVENOUS | Status: DC
Start: 2017-10-13 — End: 2017-10-13

## 2017-10-13 MED ORDER — SODIUM CHLORIDE 0.9 % IV SOLN
INTRAVENOUS | Status: AC
Start: 2017-10-13 — End: 2017-10-13
  Administered 2017-10-13: 09:00:00 via INTRAVENOUS

## 2017-10-13 MED ORDER — CETIRIZINE HCL 10 MG OR TABS
ORAL_TABLET | ORAL | 0 refills | Status: AC
Start: 2017-10-13 — End: ?

## 2017-10-13 MED ORDER — SODIUM CHLORIDE 0.9 % IV SOLN
20.0000 mg | Freq: Once | INTRAVENOUS | Status: AC
Start: 2017-10-13 — End: 2017-10-13
  Administered 2017-10-13: 20 mg via INTRAVENOUS
  Filled 2017-10-13: qty 2

## 2017-10-13 MED ORDER — MONTELUKAST SODIUM 10 MG OR TABS
ORAL_TABLET | ORAL | 0 refills | Status: AC
Start: 2017-10-13 — End: ?

## 2017-10-13 MED ORDER — ACETAMINOPHEN 325 MG PO TABS
ORAL_TABLET | ORAL | 0 refills | Status: AC
Start: 2017-10-13 — End: ?

## 2017-10-13 MED ORDER — POTASSIUM CHLORIDE CR 10 MEQ OR TBCR
10.0000 meq | EXTENDED_RELEASE_TABLET | Freq: Two times a day (BID) | ORAL | 2 refills | Status: AC
Start: 2017-10-13 — End: ?

## 2017-10-13 MED ORDER — ACETAMINOPHEN 325 MG PO TABS
650.0000 mg | ORAL_TABLET | Freq: Once | ORAL | Status: AC
Start: 2017-10-13 — End: 2017-10-13
  Administered 2017-10-13: 650 mg via ORAL
  Filled 2017-10-13: qty 2

## 2017-10-13 NOTE — Progress Notes (Signed)
Alan [name] is a 72 year old male with multiple myeloma who comes to the [facility] today for Cycle 1 Day 1 of his treatment with Daratumumab. Lab results today reveal hypokalemia with a potassium level of 3.36mol/L.  The patient denies vomiting, diarrhea or recent prescription use with diuretics. Therefore I have prescribed Potassium Chloride 477m po x1 to be administered in the infusion center today. He is otherwise due for F/U infusion visit on 10/20/17 and F/U office visit with Dr. KoBrand Malesn 11/03/17.    Lab Results   Component Value Date    CREAT 0.82 10/13/2017    K 3.3 (L) 10/13/2017       LaFloyde ParkinsMS, AGACNP-BC, AOSouth Park ViewInfusion

## 2017-10-13 NOTE — Interdisciplinary (Signed)
Chemotherapy Nursing Note - [place]    Alan [name] is a 72 year old male who presents for chemotherapy cycle 1, day(s) 1 of Daratumamab and zometa.    Current Chemotherapy Signed Informed Consent verified/obtained yes    Pre-treatment nursing assessment:  No problems identified upon assessment.    Patient met treatment parameters.    Vitals:    10/13/17 0846   BP: 129/80   BP Location: Left arm   BP Patient Position: Sitting   Pulse: 100   Resp: 18   Temp: 98.1 F (36.7 C)   TempSrc: Oral   SpO2: 99%   Weight: (!) 44 kg (97 lb 0 oz)   Height: _0  (1.575 m)     Pain Score: NA (pre med, non-pain or scheduled)  Body surface area is 1.39 meters squared.  Body mass index is 17.74 kg/m.    Chemotherapy:  [name] tolerated treatment well.      Medications   sodium chloride 0.9% infusion ( IntraVENOUS New Bag 10/13/17 0922)   acetaminophen (TYLENOL) tablet 650 mg (650 mg Oral Given 10/13/17 0921)   cetirizine (ZYRTEC) tablet 10 mg (10 mg Oral Given 10/13/17 0921)   montelukast (SINGULAIR) tablet 10 mg (10 mg Oral Given 10/13/17 0926)   dexamethasone (DECADRON) 20 mg in sodium chloride 0.9 % 50 mL IVPB (20 mg IntraVENOUS New Bag 10/13/17 0929)   daratumumab (DARZALEX) 700 mg in sodium chloride 0.9 % 1,000 mL infusion (700 mg IntraVENOUS New Bag 10/13/17 1055)   sodium chloride 0.9 % bolus 250 mL (250 mL IntraVENOUS New Bag 10/13/17 0959)   zoledronic acid (ZOMETA) 3.5 mg in sodium chloride 0.9 % 100 mL IVPB (3.5 mg IntraVENOUS New Bag 10/13/17 1030)   potassium chloride (KLOR-CON) ER tablet 40 mEq (40 mEq Oral Given 10/13/17 1121)     Post blood return: Brisk  IV access post infusion: NS      Patient Education  Learner: Patient and Family  Barriers to learning: No Barriers  Readiness to learn: Acceptance  Method: Explanation    Wellbeing Assessment   .  Discussed no concerns at this time        Treatment Education: Information/teaching given to patient including: signs and symptoms of infection, bleeding,  adverse reaction(s), symptom control, and when to notify MD.    [name] Prevention Education: Instructed patient to call for assistance.    Pain Education: Patient instructed to contact nurse if pain should develop or if their current pain therapy becomes ineffective.    Response: Verbalizes understanding    Discharge Plan  Discharge instructions given to patient.  Future appointments:date given and reviewed with treatment plan.  Discharge Mode: Ambulatory  Discharge Time: [place]  Accompanied by: Family  Discharged To: Home

## 2017-10-16 LAB — EPG, SERUM
A/G Ratio, EPG: 0.98
Albumin, EPG: 3.77 gm/dL (ref 3.20–5.00)
Alpha 1, EPG: 0.21 gm/dL (ref 0.10–0.40)
Alpha 2, EPG: 0.62 gm/dL (ref 0.60–1.10)
Beta, EPG: 0.98 gm/dL (ref 0.60–1.30)
Gamma, EPG: 2.02 gm/dL — ABNORMAL HIGH (ref 0.70–1.50)
M-Spike 1 (Gamma): 0.8 gm/dL — ABNORMAL HIGH (ref 0.00–0.01)
Total Protein, EPG: 7.6 gm/dL (ref 6.00–8.00)

## 2017-10-16 LAB — IMMUNOFIX INTERPRETATION, SERUM

## 2017-10-16 LAB — EPG, INTERPRETATION, SERUM

## 2017-10-16 LAB — IMMUNOFIXATION, BLOOD

## 2017-10-17 ENCOUNTER — Encounter (HOSPITAL_BASED_OUTPATIENT_CLINIC_OR_DEPARTMENT_OTHER): Payer: Self-pay | Admitting: Hospital

## 2017-10-20 ENCOUNTER — Other Ambulatory Visit: Payer: Self-pay

## 2017-10-20 ENCOUNTER — Ambulatory Visit (HOSPITAL_BASED_OUTPATIENT_CLINIC_OR_DEPARTMENT_OTHER): Admit: 2017-10-20 | Discharge: 2017-10-20 | Disposition: A | Discharge status: Home Health | Payer: Medicare Other

## 2017-10-20 ENCOUNTER — Ambulatory Visit (HOSPITAL_BASED_OUTPATIENT_CLINIC_OR_DEPARTMENT_OTHER): Payer: Medicare Other

## 2017-10-20 VITALS — BP 137/87 | HR 106 | Temp 98.1°F | Resp 22 | Ht 62.0 in | Wt 101.2 lb

## 2017-10-20 DIAGNOSIS — C9000 Multiple myeloma not having achieved remission: Principal | ICD-10-CM

## 2017-10-20 DIAGNOSIS — Z5112 Encounter for antineoplastic immunotherapy: Secondary | ICD-10-CM

## 2017-10-20 DIAGNOSIS — R7989 Other specified abnormal findings of blood chemistry: Secondary | ICD-10-CM

## 2017-10-20 LAB — COMPREHENSIVE METABOLIC PANEL, BLOOD
ALT (SGPT): 10 U/L (ref 0–41)
AST (SGOT): 26 U/L (ref 0–40)
Albumin: 3.3 g/dL — ABNORMAL LOW (ref 3.5–5.2)
Alkaline Phos: 95 U/L (ref 40–129)
Anion Gap: 7 mmol/L (ref 7–15)
BUN: 14 mg/dL (ref 8–23)
Bicarbonate: 26 mmol/L (ref 22–29)
Bilirubin, Tot: 0.26 mg/dL (ref <1.2)
Calcium: 8.5 mg/dL (ref 8.5–10.6)
Chloride: 105 mmol/L (ref 98–107)
Creatinine: 0.63 mg/dL — ABNORMAL LOW (ref 0.67–1.17)
GFR: >60 mL/min
Glucose: 105 mg/dL — ABNORMAL HIGH (ref 70–99)
Potassium: 4.3 mmol/L (ref 3.5–5.1)
Sodium: 138 mmol/L (ref 136–145)
Total Protein: 7 g/dL (ref 6.0–8.0)

## 2017-10-20 LAB — CBC WITH DIFF, BLOOD
ANC-Automated: 2.2 10*3/uL (ref 1.6–7.0)
ANC-Instrument: 2.2 10*3/uL (ref 1.6–7.0)
Abs Basophils: 0 10*3/uL (ref <0.1)
Abs Eosinophils: 0.1 10*3/uL (ref 0.1–0.5)
Abs Lymphs: 0.9 10*3/uL (ref 0.8–3.1)
Abs Monos: 0.3 10*3/uL (ref 0.2–0.8)
Basophils: 0 %
Eosinophils: 2 %
Hct: 30.2 % — ABNORMAL LOW (ref 40.0–50.0)
Hgb: 9.5 gm/dL — ABNORMAL LOW (ref 13.7–17.5)
Lymphocytes: 26 %
MCH: 29.1 pg (ref 26.0–32.0)
MCHC: 31.5 g/dL — ABNORMAL LOW (ref 32.0–36.0)
MCV: 92.6 um3 (ref 79.0–95.0)
MPV: 9.6 fL (ref 9.4–12.4)
Monocytes: 8 %
Plt Count: 149 10*3/uL (ref 140–370)
RBC: 3.26 10*6/uL — ABNORMAL LOW (ref 4.60–6.10)
RDW: 15.4 % — ABNORMAL HIGH (ref 12.0–14.0)
Segs: 65 %
WBC: 3.3 10*3/uL — ABNORMAL LOW (ref 4.0–10.0)

## 2017-10-20 LAB — IMMUNOGLOBULIN PANEL (IGA,IGG,IGM), BLOOD
IGA: 35 mg/dL — ABNORMAL LOW (ref 70–400)
IGG: 1996 mg/dL — ABNORMAL HIGH (ref 700–1600)
IGM: 11 mg/dL — ABNORMAL LOW (ref 40–230)

## 2017-10-20 LAB — KAPPA LAMBDA FREE LIGHT CHAIN W/ RATIO, BLOOD
Kappa Free Light Chains Quant: 229 mg/L — ABNORMAL HIGH (ref 3.3–19.4)
Kappa:Lambda Free Light Chain Ratio: 254.44 — ABNORMAL HIGH (ref 0.26–1.65)
Lambda Free Light Chains Quant: 0.9 mg/L — ABNORMAL LOW (ref 5.7–26.3)

## 2017-10-20 LAB — LDH, BLOOD: LDH: 363 U/L — ABNORMAL HIGH (ref 25–175)

## 2017-10-20 MED ORDER — ACETAMINOPHEN 325 MG PO TABS
650.00 mg | ORAL_TABLET | Freq: Once | ORAL | Status: DC | PRN
Start: 2017-10-20 — End: 2017-10-20

## 2017-10-20 MED ORDER — MONTELUKAST SODIUM 10 MG OR TABS
10.00 mg | ORAL_TABLET | Freq: Once | ORAL | Status: DC | PRN
Start: 2017-10-20 — End: 2017-10-20

## 2017-10-20 MED ORDER — DARATUMUMAB 400 MG/20ML IV SOLN
16.00 mg/kg | Freq: Once | INTRAVENOUS | Status: AC
Start: 2017-10-20 — End: 2017-10-20
  Administered 2017-10-20: 700 mg via INTRAVENOUS
  Filled 2017-10-20: qty 20

## 2017-10-20 MED ORDER — SODIUM CHLORIDE 0.9 % IV SOLN
INTRAVENOUS | Status: AC
Start: 2017-10-20 — End: 2017-10-20
  Administered 2017-10-20: 10:00:00 via INTRAVENOUS

## 2017-10-20 MED ORDER — CETIRIZINE HCL 10 MG OR TABS
10.00 mg | ORAL_TABLET | Freq: Once | ORAL | Status: AC | PRN
Start: 2017-10-20 — End: 2017-10-20
  Administered 2017-10-20: 10 mg via ORAL
  Filled 2017-10-20: qty 1

## 2017-10-20 NOTE — Interdisciplinary (Signed)
Chemotherapy Nursing Note - [place]    Cinque [name] is a 72 year old male who presents for chemotherapy cycle 1, day(s) 8 of daratumumab/lab.    Current Chemotherapy Signed Informed Consent verified/obtained yes    Pre-treatment nursing assessment:  No problems identified upon assessment. Pt walked in with walker, denied pain/dizziness/nasuea/diarrhea/fever/cough. Per son, only side effect from last infusion was exhaustion. Placed piv and drew blood as order.   Pt took dexamethasone/tylenol/singlurair at home. Zyrtec 10 mg po given. Asked son that pt needs to take tylenol/zyrtec/dexamethasone on next visit    Lab result was reviewed with son    Patient met treatment parameters.    Vitals:    10/20/17 1226 10/20/17 1330 10/20/17 1440 10/20/17 1541   BP: 139/85 129/84 132/88 137/87   BP Location:  Left arm Left arm Left arm   BP Patient Position:   Sitting Sitting   Pulse: 114 108 108 106   Resp: _0 Temp: 98.2 F (36.8 C) 97.7 F (36.5 C) 98.2 F (36.8 C) 98.1 F (36.7 C)   TempSrc: Oral Oral Oral Oral   SpO2: 99% 98% 100% 98%   Weight:       Height:         Pain Score: 0  Body surface area is 1.42 meters squared.  Body mass index is 18.51 kg/m.    Chemotherapy:  Alan [name] tolerated treatment well.    Post blood return: Brisk  IV access post infusion: NS      Patient Education  Learner: Patient and Family  Barriers to learning: No Barriers  Readiness to learn: Acceptance  Method: Explanation    Wellbeing Assessment   .  Declined.          Treatment Education: Information/teaching given to patient including: signs and symptoms of infection, bleeding, adverse reaction(s), symptom control, and when to notify MD.    Alan [name] Prevention Education: Instructed patient to call for assistance.    Pain Education: Patient instructed to contact nurse if pain should develop or if their current pain therapy becomes ineffective.    Response: Verbalizes understanding    Discharge  Plan  Discharge instructions given to patient.  Future appointments:date given and reviewed with treatment plan.  Discharge Mode: Ambulatory  Discharge Time: 1555pm  Accompanied by: Family  Discharged To: Home

## 2017-10-23 LAB — EPG, SERUM
A/G Ratio, EPG: 0.93
Albumin, EPG: 3.38 gm/dL (ref 3.20–5.00)
Alpha 1, EPG: 0.16 gm/dL (ref 0.10–0.40)
Alpha 2, EPG: 0.63 gm/dL (ref 0.60–1.10)
Beta, EPG: 0.88 gm/dL (ref 0.60–1.30)
Gamma, EPG: 1.95 gm/dL — ABNORMAL HIGH (ref 0.70–1.50)
M-Spike 1 (Gamma): 1.13 gm/dL — ABNORMAL HIGH (ref 0.00–0.01)
Total Protein, EPG: 7 gm/dL (ref 6.00–8.00)

## 2017-10-23 LAB — IMMUNOFIXATION, BLOOD

## 2017-10-24 ENCOUNTER — Ambulatory Visit (HOSPITAL_BASED_OUTPATIENT_CLINIC_OR_DEPARTMENT_OTHER): Payer: Medicare Other

## 2017-10-25 ENCOUNTER — Encounter (HOSPITAL_BASED_OUTPATIENT_CLINIC_OR_DEPARTMENT_OTHER): Payer: Self-pay | Admitting: Hospital

## 2017-10-25 LAB — IMMUNOFIX INTERPRETATION, SERUM

## 2017-10-25 LAB — EPG, INTERPRETATION, SERUM

## 2017-10-27 ENCOUNTER — Ambulatory Visit (HOSPITAL_BASED_OUTPATIENT_CLINIC_OR_DEPARTMENT_OTHER): Admit: 2017-10-27 | Discharge: 2017-10-27 | Disposition: A | Discharge status: Home Health | Payer: Medicare Other

## 2017-10-27 ENCOUNTER — Ambulatory Visit (HOSPITAL_BASED_OUTPATIENT_CLINIC_OR_DEPARTMENT_OTHER): Payer: Medicare Other

## 2017-10-27 VITALS — BP 131/76 | HR 101 | Temp 97.6°F | Resp 16 | Ht 62.0 in | Wt 97.7 lb

## 2017-10-27 DIAGNOSIS — C9000 Multiple myeloma not having achieved remission: Principal | ICD-10-CM

## 2017-10-27 DIAGNOSIS — Z5112 Encounter for antineoplastic immunotherapy: Secondary | ICD-10-CM

## 2017-10-27 LAB — COMPREHENSIVE METABOLIC PANEL, BLOOD
ALT (SGPT): 15 U/L (ref 0–41)
AST (SGOT): 20 U/L (ref 0–40)
Albumin: 3.7 g/dL (ref 3.5–5.2)
Alkaline Phos: 99 U/L (ref 40–129)
Anion Gap: 12 mmol/L (ref 7–15)
BUN: 16 mg/dL (ref 8–23)
Bicarbonate: 25 mmol/L (ref 22–29)
Bilirubin, Tot: 0.3 mg/dL (ref <1.2)
Calcium: 8.8 mg/dL (ref 8.5–10.6)
Chloride: 105 mmol/L (ref 98–107)
Creatinine: 0.69 mg/dL (ref 0.67–1.17)
GFR: >60 mL/min
Glucose: 109 mg/dL — ABNORMAL HIGH (ref 70–99)
Potassium: 3.8 mmol/L (ref 3.5–5.1)
Sodium: 142 mmol/L (ref 136–145)
Total Protein: 7.6 g/dL (ref 6.0–8.0)

## 2017-10-27 LAB — CBC WITH DIFF, BLOOD
ANC-Automated: 1.9 10*3/uL (ref 1.6–7.0)
ANC-Instrument: 1.9 10*3/uL (ref 1.6–7.0)
Abs Basophils: 0 10*3/uL (ref <0.1)
Abs Eosinophils: 0 10*3/uL (ref 0.1–0.5)
Abs Lymphs: 1 10*3/uL (ref 0.8–3.1)
Abs Monos: 0.3 10*3/uL (ref 0.2–0.8)
Basophils: 0 %
Eosinophils: 0 %
Hct: 33.4 % — ABNORMAL LOW (ref 40.0–50.0)
Hgb: 10.2 gm/dL — ABNORMAL LOW (ref 13.7–17.5)
Lymphocytes: 32 %
MCH: 29.4 pg (ref 26.0–32.0)
MCHC: 30.5 g/dL — ABNORMAL LOW (ref 32.0–36.0)
MCV: 96.3 um3 — ABNORMAL HIGH (ref 79.0–95.0)
MPV: 9.7 fL (ref 9.4–12.4)
Monocytes: 9 %
Plt Count: 178 10*3/uL (ref 140–370)
RBC: 3.47 10*6/uL — ABNORMAL LOW (ref 4.60–6.10)
RDW: 15.5 % — ABNORMAL HIGH (ref 12.0–14.0)
Segs: 59 %
WBC: 3.2 10*3/uL — ABNORMAL LOW (ref 4.0–10.0)

## 2017-10-27 MED ORDER — SODIUM CHLORIDE 0.9 % IV SOLN
16.0000 mg/kg | Freq: Once | INTRAVENOUS | Status: AC
Start: 2017-10-27 — End: 2017-10-27
  Administered 2017-10-27: 700 mg via INTRAVENOUS
  Filled 2017-10-27: qty 20

## 2017-10-27 MED ORDER — SODIUM CHLORIDE 0.9 % IV SOLN
INTRAVENOUS | Status: AC
Start: 2017-10-27 — End: 2017-10-27
  Administered 2017-10-27: 09:00:00 via INTRAVENOUS

## 2017-10-27 NOTE — Interdisciplinary (Signed)
Chemotherapy Nursing Note - [place]    Alan [name] is a 72 year old male who presents for chemotherapy cycle 1, day(s) 15 of daratumumab    Current Chemotherapy Signed Informed Consent verified/obtained yes    Pre-treatment nursing assessment:  No problems identified upon assessment.  Pt denies fever/chills, n/v/d.  Pt arrived with son.  PIV inserted on 2nd attempt to Left lower arm 24g.  Labs drawn and sent.  Pt reported taking pre- medications prior to arrival @ 0745. Pt up ambulating with FWW accompanied by son multiple times.  Asked pt if there was anything bothering him and denied.  Son asked multiple times the rates and confirming max rate.  Educated pt & son that max rate of infusion is 200 mL/hr.      Patient met treatment parameters.    Vitals:    10/27/17 0910 10/27/17 1140 10/27/17 1310   BP: 131/85 (!) 141/91 131/76   BP Location: Left arm Right arm Right arm   BP Patient Position: Sitting Sitting Sitting   Pulse: 114 110 101   Resp: 17 16 16    Temp: 98.4 F (36.9 C) 98 F (36.7 C) 97.6 F (36.4 C)   TempSrc: Oral Oral Oral   SpO2: 95%     Weight: (!) 44.3 kg (97 lb 10.6 oz)     Height: 5' 2"  (1.575 m)       Pain Score: 0  Body surface area is 1.39 meters squared.  Body mass index is 17.86 kg/m.    Chemotherapy:  Alan [name] tolerated treatment well.    Post blood return: Brisk  IV access post infusion: NS    Medications   sodium chloride 0.9% infusion ( IntraVENOUS Stopped 10/27/17 1305)   daratumumab (DARZALEX) 700 mg in sodium chloride 0.9 % 500 mL infusion (0 mg IntraVENOUS Stopped 10/27/17 1305)           Patient Education  Learner: Patient  Barriers to learning: No Barriers  Readiness to learn: Acceptance  Method: Explanation    Wellbeing Assessment   .  Discussed declined.          Treatment Education: Information/teaching given to patient including: signs and symptoms of infection, bleeding, adverse reaction(s), symptom control, and when to notify MD.    [name]  Prevention Education: Instructed patient to call for assistance.    Pain Education: Patient instructed to contact nurse if pain should develop or if their current pain therapy becomes ineffective.    Response: Verbalizes understanding    Discharge Plan  Discharge instructions given to patient.  Future appointments:date given and reviewed with treatment plan.  Discharge Mode: [place]  Accompanied by: Family  Discharged To: Home

## 2017-11-01 ENCOUNTER — Encounter (HOSPITAL_BASED_OUTPATIENT_CLINIC_OR_DEPARTMENT_OTHER): Payer: Self-pay | Admitting: Hospital

## 2017-11-03 ENCOUNTER — Encounter: Payer: Self-pay | Admitting: Hospital

## 2017-11-03 ENCOUNTER — Ambulatory Visit (HOSPITAL_BASED_OUTPATIENT_CLINIC_OR_DEPARTMENT_OTHER): Admit: 2017-11-03 | Discharge: 2017-11-03 | Disposition: A | Discharge status: Home Health | Payer: Medicare Other

## 2017-11-03 ENCOUNTER — Ambulatory Visit: Payer: Medicare Other | Attending: Hematology & Oncology | Admitting: Hematology & Oncology

## 2017-11-03 ENCOUNTER — Ambulatory Visit (HOSPITAL_BASED_OUTPATIENT_CLINIC_OR_DEPARTMENT_OTHER): Payer: Medicare Other

## 2017-11-03 VITALS — BP 140/86 | HR 116 | Temp 97.9°F | Resp 16 | Ht 62.0 in | Wt 99.9 lb

## 2017-11-03 DIAGNOSIS — Z5112 Encounter for antineoplastic immunotherapy: Secondary | ICD-10-CM

## 2017-11-03 DIAGNOSIS — C9000 Multiple myeloma not having achieved remission: Secondary | ICD-10-CM

## 2017-11-03 LAB — CBC WITH DIFF, BLOOD
ANC-Instrument: 1.7 10*3/uL (ref 1.6–7.0)
ANC-Manual Mode: 2 10*3/uL (ref 1.6–7.0)
Abs Basophils: 0 10*3/uL (ref <0.1)
Abs Eosinophils: 0 10*3/uL (ref 0.1–0.5)
Abs Lymphs: 0.7 10*3/uL — ABNORMAL LOW (ref 0.8–3.1)
Abs Monos: 0.3 10*3/uL (ref 0.2–0.8)
Basophils: 0 %
Eosinophils: 0 %
Hct: 30.6 % — ABNORMAL LOW (ref 40.0–50.0)
Hgb: 9.7 gm/dL — ABNORMAL LOW (ref 13.7–17.5)
Lymphocytes: 23 %
MCH: 29.1 pg (ref 26.0–32.0)
MCHC: 31.7 g/dL — ABNORMAL LOW (ref 32.0–36.0)
MCV: 91.9 um3 (ref 79.0–95.0)
MPV: 9.7 fL (ref 9.4–12.4)
Monocytes: 11 %
Plt Count: 191 10*3/uL (ref 140–370)
RBC: 3.33 10*6/uL — ABNORMAL LOW (ref 4.60–6.10)
RDW: 15.3 % — ABNORMAL HIGH (ref 12.0–14.0)
Segs: 65 %
WBC: 3.1 10*3/uL — ABNORMAL LOW (ref 4.0–10.0)

## 2017-11-03 LAB — COMPREHENSIVE METABOLIC PANEL, BLOOD
ALT (SGPT): 11 U/L (ref 0–41)
AST (SGOT): 17 U/L (ref 0–40)
Albumin: 3.5 g/dL (ref 3.5–5.2)
Alkaline Phos: 109 U/L (ref 40–129)
Anion Gap: 9 mmol/L (ref 7–15)
BUN: 17 mg/dL (ref 8–23)
Bicarbonate: 26 mmol/L (ref 22–29)
Bilirubin, Tot: 0.32 mg/dL (ref <1.2)
Calcium: 8.9 mg/dL (ref 8.5–10.6)
Chloride: 104 mmol/L (ref 98–107)
Creatinine: 0.84 mg/dL (ref 0.67–1.17)
GFR: >60 mL/min
Glucose: 108 mg/dL — ABNORMAL HIGH (ref 70–99)
Potassium: 3.9 mmol/L (ref 3.5–5.1)
Sodium: 139 mmol/L (ref 136–145)
Total Protein: 7.4 g/dL (ref 6.0–8.0)

## 2017-11-03 LAB — MDIFF
Metamyelocytes: 1 %
Number of Cells Counted: 113
Plt Est: ADEQUATE

## 2017-11-03 LAB — LDH, BLOOD: LDH: 192 U/L — ABNORMAL HIGH (ref 25–175)

## 2017-11-03 LAB — KAPPA LAMBDA FREE LIGHT CHAIN W/ RATIO, BLOOD
Kappa Free Light Chains Quant: 215.5 mg/L — ABNORMAL HIGH (ref 3.3–19.4)
Kappa:Lambda Free Light Chain Ratio: 269.38 — ABNORMAL HIGH (ref 0.26–1.65)
Lambda Free Light Chains Quant: 0.8 mg/L — ABNORMAL LOW (ref 5.7–26.3)

## 2017-11-03 LAB — IMMUNOGLOBULIN PANEL (IGA,IGG,IGM), BLOOD
IGA: 20 mg/dL — ABNORMAL LOW (ref 70–400)
IGG: 2196 mg/dL — ABNORMAL HIGH (ref 700–1600)
IGM: 8 mg/dL — ABNORMAL LOW (ref 40–230)

## 2017-11-03 MED ORDER — ACETAMINOPHEN 325 MG PO TABS
650.0000 mg | ORAL_TABLET | Freq: Once | ORAL | Status: AC | PRN
Start: 2017-11-03 — End: 2017-11-03
  Administered 2017-11-03: 650 mg via ORAL
  Filled 2017-11-03: qty 2

## 2017-11-03 MED ORDER — SODIUM CHLORIDE 0.9 % IV SOLN
12.0000 mg | Freq: Once | INTRAVENOUS | Status: AC | PRN
Start: 2017-11-03 — End: 2017-11-03
  Administered 2017-11-03: 12 mg via INTRAVENOUS
  Filled 2017-11-03: qty 1.2

## 2017-11-03 MED ORDER — SODIUM CHLORIDE 0.9 % IV SOLN
INTRAVENOUS | Status: AC
Start: 2017-11-03 — End: 2017-11-03
  Administered 2017-11-03: 10:00:00 via INTRAVENOUS

## 2017-11-03 MED ORDER — SODIUM CHLORIDE 0.9 % IV SOLN
16.0000 mg/kg | Freq: Once | INTRAVENOUS | Status: AC
Start: 2017-11-03 — End: 2017-11-03
  Administered 2017-11-03: 700 mg via INTRAVENOUS
  Filled 2017-11-03: qty 20

## 2017-11-03 MED ORDER — MEPERIDINE HCL 25 MG/ML IJ SOLN
25.0000 mg | Freq: Once | INTRAMUSCULAR | Status: AC | PRN
Start: 2017-11-03 — End: 2017-11-03

## 2017-11-03 MED ORDER — CETIRIZINE HCL 10 MG OR TABS
10.0000 mg | ORAL_TABLET | Freq: Once | ORAL | Status: AC | PRN
Start: 2017-11-03 — End: 2017-11-03
  Administered 2017-11-03: 10 mg via ORAL
  Filled 2017-11-03: qty 1

## 2017-11-03 MED ORDER — SODIUM CHLORIDE 0.9 % IV BOLUS
500.0000 mL | INJECTION | Freq: Once | INTRAVENOUS | Status: AC
Start: 2017-11-03 — End: 2017-11-03
  Administered 2017-11-03: 500 mL via INTRAVENOUS

## 2017-11-03 NOTE — Progress Notes (Signed)
BONE MARROW TRANSPLANT      Diagnosis: Multiple myeloma    Primary Care Physician: Bobette Mo, MD  Oncologist: Dr. Charlaine Dalton, MD    Myeloma Data at Diagnosis:  Date of diagnosis:   Age: 72  Hgb:   Creat:   Alb:   Calcium:   B2M: 2.5  Bone marrow plasma cell %:   Cytogenetics/FISH:  Skeletal survey:    Free light chains: kappa 1005.1/lambda 7.1; ratio 141.56   SPEP/ M-protein: 3.1  IFE: IgG kappa  R-ISS:  ISS: unknown  [place]:    Interval History:  Since his last appt - he started dara/dex  He notes dramatic fatigue, usually about 3 days after dara infusion  He also notes the same SOB he had when he was on pomalidomide again.  Headache and some occasional muscle/joint pain.     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  He had an abnl chest X-ray which lead to a chest CT which noted multiple lytic lesions in thoracic vertebrae, as well as compression fractures.  He was referred to Dr. Charlaine Dalton for further evaluation.  He had a bone marrow biopsy that showed MM.  He had significant pain from his spine lesions and had palliative radiation from 03/24/16-04/11/16 from T9-T11 and exophytic masses.  His pain resolved once he finished radiation therapy.      He was then started on Revlimid 20 mg po daily 2 weeks on 1 week off as well as Velcade SQ and dexamethasone    He had several hospitalizations for infection, difficulty with weight gain, eating.  He was placed on TPN and then transitioned to tube feeds by 11/17.      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  At this point, he's off of dexamethasone.  He is taking Revlimid 5 mg po daily 1 week on 1 week off.    Given his significant debilitation and illness, he moved to [facility] to have more support/caregiving.  He and his wife have moved in with their son. Children:  Sons: Alan [name] and Alan [name] in [place].    Daughter: Alan [name]    Hospitalizations and significant events:   9/26-10/4: Admitted with N/V and failure to thrive, felt to have gastritis  10/16-10/25: Admitted with aspiration pneumonia; tx with vanc/zosyn/azithro  05/18/16: PEG tube placed  06/18/16: ER visit for abdominal pain at PEG site    07/2016: Attempted lenalidomide 10 mg (was on 5 mg every other week prior to moving).  Did not tolerate so went to 10 mg every other day until complete  08/2016: Changed to pom/dex with pomalidomide 4 mg po daily days 1-21 and dex 20 mg po weekly.  Tolerance was about the same as lenalidomide.  But M-spike responded.  09/2016: Took him off of pom/dex after only 1 cycle due to significant frailty and no improvement with improvement in myeloma.  Not clear pom/dex is actually what is causing his debilitation/frailty/failure to thrive, but feel like he is too frail to consider continued therapy when myeloma has responded.      10/13/2017: started dara/dex for biochemical relapse    Review of Systems:  General: No fever, chills.  +fatigue improving  Eyes: No icterus, vision change, double vision, eye pain, blurry vision, or floaters.   Oral/throat: No oral pain, oral ulcers or lesions, tooth pain,  sore throat.   Nose: No nasal discharge, sinus pain.   Ears: No change in hearing or ear pain.   Neck: No pain, no masses.  Cardiac: No chest pain or pressure, palpitations, or edema.   Pulmonary: No chest pain, SOB resolved  Abdomen: No pain, bloating, nausea resolved, diarrhea   Genitourinary: No dysuria, change in urinary frequency.   Extremities: No swelling, pain +numbness in feet, also fingertips.   Skin: No rash, lesions, pruritus.   Neurologic:  No headache, numbness  Psych: Normal mood     Past Medical/Surgical Hx:   1. Multiple myeloma  2. Retinal detachment 2006    Social History:   He is married. He and his wife moved from [place] to the [place] in 2002.  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  Since 2010  he's been very involved with an ashram in [place], [place].  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    He denies EtOH/never smoker/no smokeless tobacco.  He is vegetarian  He has 3 children, 2 sons and 1 daughter    Family History:   He has no known family h/o cancer.  Father died when patient was only 3 mos but he is not aware of cause    Medications:    Current Outpatient Medications:     acetaminophen (TYLENOL) 325 MG tablet, Take 2 tablets (650 mg) by mouth 1-3 hours prior to daratumumab infusions starting with SECOND daratumumab infusion., Disp: 40 tablet, Rfl: 0    acyclovir (ZOVIRAX) 400 MG tablet, Take 1 tablet (400 mg) by mouth 2 times daily., Disp: 60 tablet, Rfl: 6    calcium-vitamin D (CALTRATE 600+D) 600-400 MG-UNIT tablet, Take 1 tablet by mouth 2 times daily., Disp: , Rfl:     cetirizine (ZYRTEC) 10 MG tablet, Take 1 tablet by mouth 1-3 hours prior to daratumumab infusions starting with SECOND daratumumab infusion., Disp: 20 tablet, Rfl: 0    Cobalamine Combinations (B-12) [phone] MCG SUBL, , Disp: , Rfl:     cyanocobalamin ER 1000 MCG tablet, Take 1 tablet (1,000 mcg) by mouth daily., Disp: 30 tablet, Rfl: 3    dexamethasone (DECADRON) 4 MG tablet, Take 5 tablets (20 mg) by mouth 1-3 hours prior to SECOND daratumumab infusion and Take 3 tablets (12 mg) by mouth 1-3 hours prior to ALL SUBSEQUENT daratumumab infusions., Disp: 60 tablet, Rfl: 0    montelukast (SINGULAIR) 10 MG tablet, Take 1 tablet (10 mg) by mouth the day before and 1-3 hours prior to daratumumab infusions, Disp: 6 tablet, Rfl: 0    pantoprazole (PROTONIX) 40 MG packet, USE ONE PACKET AS DIRECTED AND DRINK DAILY., Disp: 30 each, Rfl: 2    potassium chloride (K-DUR) 10 MEQ Sustained-Release tablet, Take 1 tablet (10 mEq) by mouth 2 times daily., Disp: 60 tablet, Rfl: 2  No current facility-administered medications for this visit.     Facility-Administered Medications Ordered in Other Visits:     acetaminophen  (TYLENOL) tablet 650 mg, 650 mg, Oral, Once PRN, Georgiana Shore, MD, 650 mg at 11/03/17 0957    cetirizine (ZYRTEC) tablet 10 mg, 10 mg, Oral, Once PRN, Georgiana Shore, MD, 10 mg at 11/03/17 0957    dexamethasone (DECADRON) 12 mg in sodium chloride 0.9 % 50 mL IVPB, 12 mg, IntraVENOUS, Once PRN, Georgiana Shore, MD, Last Rate: 200 mL/hr at 11/03/17 0958, 12 mg at 11/03/17 0958    meperidine (DEMEROL) injection 25  mg, 25 mg, IntraVENOUS, Once PRN, Georgiana Shore, MD    sodium chloride 0.9% infusion, , IntraVENOUS, Continuous, Georgiana Shore, MD, Last Rate: 10 mL/hr at 11/03/17 0957    Allergies:  Patient has no known allergies.    Objective:  There were no vitals filed for this visit.    Physical Exam:   Karnofsky performance status: 70  General: Thin, chronically ill appearing male, no distress    HEENT: EOMI, PERRL, sclera anicteric, conjunctiva pink and moist, oral cavity without lesions or ulcers, tonsils normal   Neck: Supple, no lymphadenopathy   Lungs: Clear to auscultation bilaterally, no wheezes, rubs, or rales.    Cardiac: No JVD, tachycardic, normal rhythm, normal S1, S2, no murmurs or gallops   Abdomen: Not distended, normal bowel sounds, soft, non-tender, no organomegaly   Extremities: Warm, well perfused, no cyanosis, no clubbing, no edema   Skin: No jaundice, no petechiae, no purpura   Lymph: No lymphadenopathy at cervical, supraclavicular, axillary, or inguinal lymph nodes   Neurologic: Awake, alert, oriented.  +BLE weakness R>L    Lab results:  Lab Results   Component Value Date    WBC 3.1 (L) 11/03/2017    RBC 3.33 (L) 11/03/2017    HGB 9.7 (L) 11/03/2017    HCT 30.6 (L) 11/03/2017    MCV 91.9 11/03/2017    MCHC 31.7 (L) 11/03/2017    RDW 15.3 (H) 11/03/2017    PLT 191 11/03/2017    MPV 9.7 11/03/2017    SEG 65 11/03/2017    LYMPHS 23 11/03/2017    MONOS 11 11/03/2017    EOS 0 11/03/2017    BASOS 0 11/03/2017     Lab Results   Component Value Date    NA 139 11/03/2017     K 3.9 11/03/2017    CL 104 11/03/2017    BICARB 26 11/03/2017    BUN 17 11/03/2017    CREAT 0.84 11/03/2017    GLU 108 (H) 11/03/2017    [place] 8.9 11/03/2017     Lab Results   Component Value Date    AST 17 11/03/2017    ALT 11 11/03/2017    LDH 192 (H) 11/03/2017    ALK 109 11/03/2017    TP 7.4 11/03/2017    ALB 3.5 11/03/2017    TBILI 0.32 11/03/2017     Results for Alan, [name] (MRN [id number]) as of 11/03/2017 15:08   Ref. Range 08/15/2017 10:04 10/02/2017 14:17 10/13/2017 09:15 10/20/2017 09:30   M-Spike 1 (Gamma) Latest Ref Range: 0.00 - 0.01 gm/dL 1.09 (H) 1.03 (H) 0.80 (H) 1.13 (H)   IGA Latest Ref Range: 70 - 400 mg/dL 55 (L) 57 (L) 57 (L) 35 (L)   IGG Latest Ref Range: 700 - 1,600 mg/dL 1,417 1,699 (H) 2,213 (H) 1,996 (H)   IGM Latest Ref Range: 40 - 230 mg/dL 13 (L) 14 (L) 14 (L) 11 (L)   Kappa Free Light Chains Quant Latest Ref Range: 3.3 - 19.4 mg/L 240.0 (H) 277.3 (H) 347.8 (H) 229.0 (H)   Lambda Free Light Chains Quant Latest Ref Range: 5.7 - 26.3 mg/L 10.4 8.8 8.3 0.9 (L)   Kappa:Lambda Free Light Chain Ratio Latest Ref Range: 0.26 - 1.65  23.08 (H) 31.51 (H) 41.90 (H) 254.44 (H)     Pathology:  [facility] BMBx 03/25/16  1. Plasma cell neoplasm with mildy atypical plasma cells (>90% of the sampled marrow in the clot sections)  2. Abnormal FISH result: CCND1/IGH translocation with  aneuploidy - t(11;14).  Aneuploid for all probes.  No evidence of t(4;14) or t(14;16)  3. Abnormal SNP microarray result, see separate report for details    Karyotype: 46,XY[20]  MM FISH: CCND1/IGH translocation with aneuploidy - t(11;14)    ASSESSMENT AND PLAN:  72 yo M with IgG kappa multiple myeloma.  He has been on therapy with RVD, but due to multiple hospitalizations, has been unable to tolerate full dose therapy.  We started on pomalidomide 4 mg po days 1-21 along with weekly dexamethasone 20 mg but stopped therapy after a month (M-spike dropped nicely but did have some sx, not clear if attributable to pom/dex as continues with  them: nausea, decreased appetite)    #IgG kappa MM:   Biochemical relapse - but he's been very symptomatic on tx before and has no CRAB criteria right now. Low hgb, but this is baseline (even when MM controlled)   Check PET/CT now. I am concerned his MM is symptmoatic now, with progression of his anemia. I'd like a baseline scan, and then to start tx with daratumumab.  I favor dara as both len and pom caused significant side effects (loss of appeite with major weight loss, SOB?? Weakness) and bortezomib led to significant neuropathy.  I do not wish to try carfilzomib given his prior months of dyspnea and tachypnea.     He began dara/dex 4/5 - has had significant fatigue and SOB again as he did with prior regimens.  Though not typical, I will plan to go to every other week dosing now to see if this helps his sx and yet keeps MM under control.     Some of his fatigue may be steroid crash, and we'll try to taper over a couple of days and see if that helps    #B12 deficiency:   Per outside records previously deficient, tx with B12 injections, now appears resolved   Monitor B12, likely continue oral supplements    #Debilitation:   Much improved    Plan:  Space dara to every other week  Not eating/drinking as well --> give NS 1/2 liter today, and twice weekly at home.

## 2017-11-03 NOTE — Patient Instructions (Addendum)
PLAN:  1. Plan to decrease frequency of RCHOP now, to every other week. Next infusion would be 11/26/17.  2. Start dex taper after each weekly dose.   On weeks of infusions: take 12 mg on Friday, followed by 8 mg on Sat, and 4 mg on Sunday.  On off week: take 8 mg on Friday, followed by 4 mg in Saturday.  3. Will send orders to [facility] [phone]) for PRN hydration twice weekly.  4. Return to clinic 5/24.      CASE MANAGER:  Shela Nevin, RN     Phone # [phone]  Fax # [phone]  Pager # [phone]    After office hours MD on call line: [phone]. Ask to speak with BMT on call doctor     **Call to schedule, cancel, or reschedule clinic appointments: [phone]    [place]: [phone]  [place]: [phone]  Social Worker Lamonte Sakai): [phone]

## 2017-11-03 NOTE — Interdisciplinary (Signed)
Infusion Nursing Note - [place] is a 72 year old male who presents for hydration, labs and cycle 1, day(s) 22 on Daratumumab Weekly x 8, then Q 2 Weeks x 8, then Q 4 Weeks.    Pre-treatment nursing assessment:  No problems identified upon assessment.    PIV with 24 ga.catheter inserted to dorsum, distal right forearm without problem. Good blood return noted upon aspiration with labs obtained followed by flush with 5 ml normal saline to clear. Op-site placed.     Objective Data:  Vitals:    11/03/17 1222 11/03/17 1323 11/03/17 1418 11/03/17 1542   BP: (!) 135/91 121/88 131/85 140/86   BP Location:  Left arm Left arm Left arm   BP Patient Position:  Standing Sitting Sitting   Pulse: 111 126 118 116   Resp: _0 Temp:  98.1 F (36.7 C) 97.9 F (36.6 C) 97.9 F (36.6 C)   TempSrc:  Oral Oral Oral   SpO2: 100%      Weight:       Height:         Pain Score: NA (pre med, non-pain or scheduled)  Body surface area is 1.41 meters squared.  Body mass index is 18.27 kg/m.    Patient met treatment parameters.    Labs, hydration and chemotherapy:  Patient tolerated treatment well. PIV removed with catheter intact. Site without redness, swelling, discomfort or drainage. Sterile 2 x 2 gauze pressure dressing applied and secured with Coban.    Patient Education  Learner: Patient  Readiness to learn: Acceptance  Method: Explanation    Wellbeing Assessment  Not assessed    Treatment Education: Information/teaching given to patient including: signs and symptoms of infection, bleeding, adverse reaction(s), symptom control, and when to notify MD.  [name] Prevention Education: Instructed patient to call for assistance.  Pain Education: Patient instructed to contact nurse if pain should develop or if their current pain therapy becomes ineffective.  Response: Verbalizes understanding    Discharge Plan  Discharge instructions given to patient.  Future appointments:date given and reviewed  with treatment plan.  Discharge Mode: Wheelchair  Discharge Time: [place] by: Self and family  Discharged To: Home

## 2017-11-05 ENCOUNTER — Other Ambulatory Visit: Payer: Self-pay

## 2017-11-05 ENCOUNTER — Encounter (HOSPITAL_BASED_OUTPATIENT_CLINIC_OR_DEPARTMENT_OTHER): Payer: Self-pay | Admitting: Hematology & Oncology

## 2017-11-06 ENCOUNTER — Telehealth: Payer: Self-pay | Admitting: Internal Medicine

## 2017-11-06 ENCOUNTER — Encounter (HOSPITAL_BASED_OUTPATIENT_CLINIC_OR_DEPARTMENT_OTHER): Payer: Self-pay | Admitting: Hematology & Oncology

## 2017-11-06 DIAGNOSIS — C9000 Multiple myeloma not having achieved remission: Principal | ICD-10-CM

## 2017-11-06 LAB — EPG, SERUM
A/G Ratio, EPG: 0.98
Albumin, EPG: 3.37 gm/dL (ref 3.20–5.00)
Alpha 1, EPG: 0.19 gm/dL (ref 0.10–0.40)
Alpha 2, EPG: 0.57 gm/dL — ABNORMAL LOW (ref 0.60–1.10)
Beta, EPG: 0.86 gm/dL (ref 0.60–1.30)
Gamma, EPG: 1.82 gm/dL — ABNORMAL HIGH (ref 0.70–1.50)
M-Spike 1 (Gamma): 1.32 gm/dL — ABNORMAL HIGH (ref 0.00–0.01)
Total Protein, EPG: 6.8 gm/dL (ref 6.00–8.00)

## 2017-11-06 LAB — IMMUNOFIXATION, BLOOD

## 2017-11-06 LAB — IMMUNOFIX INTERPRETATION, SERUM

## 2017-11-06 LAB — EPG, INTERPRETATION, SERUM

## 2017-11-06 MED ORDER — OXYCODONE HCL 5 MG OR TABS
5.0000 mg | ORAL_TABLET | Freq: Four times a day (QID) | ORAL | 0 refills | Status: AC | PRN
Start: 2017-11-06 — End: ?

## 2017-11-06 NOTE — Telephone Encounter (Signed)
Called pt's son, Alan [name], d/t mychart messge and VM message received. Per Alan [name], pt was found to have fracture yesterday at [facility] ED when he took pt in d/t sever pain. [name] states pt was not sent home with any pain medication. Let him know that we have placed a referral to Radiation Oncology, and that Dr. Brand Males would like to continue Daratumumab weekly instead of every other week, and to add Pomalyst 4 mg daily (days 1-21). Alan [name] states he will speak with his father about this, and will reach out to me tomorrow after speaking with him.

## 2017-11-06 NOTE — Telephone Encounter (Signed)
Attempted to return call after receiving page. No answer. Left voicemail to return call.

## 2017-11-06 NOTE — Telephone Encounter (Signed)
Spoke to pt's son- [name] re: pt's care; pt currently in [place].

## 2017-11-07 ENCOUNTER — Encounter (HOSPITAL_BASED_OUTPATIENT_CLINIC_OR_DEPARTMENT_OTHER): Payer: Self-pay | Admitting: Hospital

## 2017-11-07 ENCOUNTER — Other Ambulatory Visit: Payer: Self-pay

## 2017-11-07 DIAGNOSIS — C9000 Multiple myeloma not having achieved remission: Principal | ICD-10-CM

## 2017-11-07 MED ORDER — ASPIRIN 81 MG OR CHEW
81.0000 mg | CHEWABLE_TABLET | Freq: Every day | ORAL | 5 refills | Status: AC
Start: 2017-11-07 — End: ?
  Filled 2017-11-07: qty 30, 30d supply, fill #0

## 2017-11-07 MED ORDER — POMALIDOMIDE 4 MG PO CAPS
4.0000 mg | ORAL_CAPSULE | Freq: Every day | ORAL | 0 refills | Status: AC
Start: 2017-11-07 — End: ?
  Filled 2017-11-07: qty 21, 28d supply, fill #0

## 2017-11-07 NOTE — Telephone Encounter (Signed)
Called and spoke with pt's son, [name]. Per [name], he discussed with his father about continuing weekly daratumumab (instead of decreasing frequency to every other week), and about adding Pomalyst to current regimen. He states pt is agreeable about restarting Pomalyst, but does not want to continue daratumumab, especially this week d/t pain (long car ride + long infusion) Let him know that we will keep next weeks appt for now (5/10), and we can re discuss next week. Let him know I send Rx for Pomalyst and aspirin to [facility].    Phone number given to [place] to schedule PET scan to assess for any impending fractures and to obtain current baseline. Let him know that we have placed referral to radiation oncology and phone number given to schedule.    All other questions answered at time of call. [place] ED records request given to [place].

## 2017-11-08 ENCOUNTER — Other Ambulatory Visit: Payer: Self-pay

## 2017-11-08 ENCOUNTER — Ambulatory Visit
Admission: RE | Admit: 2017-11-08 | Discharge: 2017-11-08 | Disposition: A | Discharge status: Home / Self Care | Payer: Medicare Other | Attending: Radiation Oncology | Admitting: Radiation Oncology

## 2017-11-08 DIAGNOSIS — C9000 Multiple myeloma not having achieved remission: Principal | ICD-10-CM | POA: Insufficient documentation

## 2017-11-09 ENCOUNTER — Other Ambulatory Visit: Payer: Self-pay

## 2017-11-10 ENCOUNTER — Other Ambulatory Visit: Payer: Self-pay

## 2017-11-10 ENCOUNTER — Encounter (HOSPITAL_BASED_OUTPATIENT_CLINIC_OR_DEPARTMENT_OTHER): Payer: Self-pay | Admitting: Radiation Oncology

## 2017-11-10 ENCOUNTER — Emergency Department (EMERGENCY_DEPARTMENT_HOSPITAL): Payer: Medicare Other

## 2017-11-10 ENCOUNTER — Ambulatory Visit (HOSPITAL_BASED_OUTPATIENT_CLINIC_OR_DEPARTMENT_OTHER)
Admit: 2017-11-10 | Discharge: 2017-11-10 | Disposition: A | Discharge status: Home Health | Payer: Medicare Other | Attending: Hematology & Oncology | Admitting: Hematology & Oncology

## 2017-11-10 ENCOUNTER — Emergency Department
Admission: EM | Admit: 2017-11-10 | Discharge: 2017-11-10 | Disposition: A | Discharge status: Home / Self Care | Payer: Medicare Other | Source: Ambulatory Visit | Attending: Emergency Medicine | Admitting: Emergency Medicine

## 2017-11-10 ENCOUNTER — Ambulatory Visit (HOSPITAL_BASED_OUTPATIENT_CLINIC_OR_DEPARTMENT_OTHER): Payer: Medicare Other

## 2017-11-10 DIAGNOSIS — M4802 Spinal stenosis, cervical region: Secondary | ICD-10-CM | POA: Insufficient documentation

## 2017-11-10 DIAGNOSIS — M8580 Other specified disorders of bone density and structure, unspecified site: Secondary | ICD-10-CM

## 2017-11-10 DIAGNOSIS — C9000 Multiple myeloma not having achieved remission: Secondary | ICD-10-CM | POA: Insufficient documentation

## 2017-11-10 DIAGNOSIS — M4852XA Collapsed vertebra, not elsewhere classified, cervical region, initial encounter for fracture: Principal | ICD-10-CM | POA: Insufficient documentation

## 2017-11-10 DIAGNOSIS — M8458XA Pathological fracture in neoplastic disease, other specified site, initial encounter for fracture: Secondary | ICD-10-CM

## 2017-11-10 DIAGNOSIS — D649 Anemia, unspecified: Secondary | ICD-10-CM | POA: Insufficient documentation

## 2017-11-10 DIAGNOSIS — M4312 Spondylolisthesis, cervical region: Secondary | ICD-10-CM

## 2017-11-10 DIAGNOSIS — Z7982 Long term (current) use of aspirin: Secondary | ICD-10-CM | POA: Insufficient documentation

## 2017-11-10 DIAGNOSIS — Z79899 Other long term (current) drug therapy: Secondary | ICD-10-CM | POA: Insufficient documentation

## 2017-11-10 DIAGNOSIS — M8448XA Pathological fracture, other site, initial encounter for fracture: Secondary | ICD-10-CM

## 2017-11-10 DIAGNOSIS — M4854XA Collapsed vertebra, not elsewhere classified, thoracic region, initial encounter for fracture: Secondary | ICD-10-CM | POA: Insufficient documentation

## 2017-11-10 DIAGNOSIS — M40204 Unspecified kyphosis, thoracic region: Secondary | ICD-10-CM

## 2017-11-10 DIAGNOSIS — S12100A Unspecified displaced fracture of second cervical vertebra, initial encounter for closed fracture: Secondary | ICD-10-CM

## 2017-11-10 DIAGNOSIS — R600 Localized edema: Secondary | ICD-10-CM

## 2017-11-10 LAB — BASIC METABOLIC PANEL, BLOOD
Anion Gap: 11 mmol/L (ref 7–15)
BUN: 14 mg/dL (ref 8–23)
Bicarbonate: 24 mmol/L (ref 22–29)
Calcium: 8.5 mg/dL (ref 8.5–10.6)
Chloride: 102 mmol/L (ref 98–107)
Creatinine: 0.89 mg/dL (ref 0.67–1.17)
GFR: >60 mL/min
Glucose: 100 mg/dL — ABNORMAL HIGH (ref 70–99)
Potassium: 3.7 mmol/L (ref 3.5–5.1)
Sodium: 137 mmol/L (ref 136–145)

## 2017-11-10 LAB — CBC WITH DIFF, BLOOD
ANC-Automated: 2.8 10*3/uL (ref 1.6–7.0)
Abs Basophils: 0 10*3/uL (ref <0.1)
Abs Eosinophils: 0 10*3/uL (ref 0.1–0.5)
Abs Lymphs: 0.7 10*3/uL — ABNORMAL LOW (ref 0.8–3.1)
Abs Monos: 0.3 10*3/uL (ref 0.2–0.8)
Basophils: 0 %
Eosinophils: 0 %
Hct: 36.1 % — ABNORMAL LOW (ref 40.0–50.0)
Hgb: 11.7 gm/dL — ABNORMAL LOW (ref 13.7–17.5)
Lymphocytes: 19 %
MCH: 29.6 pg (ref 26.0–32.0)
MCHC: 32.4 g/dL (ref 32.0–36.0)
MCV: 91.4 um3 (ref 79.0–95.0)
MPV: 10 fL (ref 9.4–12.4)
Monocytes: 8 %
Plt Count: 190 10*3/uL (ref 140–370)
RBC: 3.95 10*6/uL — ABNORMAL LOW (ref 4.60–6.10)
RDW: 14.7 % — ABNORMAL HIGH (ref 12.0–14.0)
Segs: 72 %
WBC: 3.9 10*3/uL — ABNORMAL LOW (ref 4.0–10.0)

## 2017-11-10 LAB — PROTHROMBIN TIME, BLOOD
INR: 1.3
PT,Patient: 14.7 s — ABNORMAL HIGH (ref 9.7–12.5)

## 2017-11-10 LAB — HCV ANTIBODY WITH REFLEX QUANT: Hepatitis C Ab: NONREACTIVE

## 2017-11-10 MED ORDER — OXYCODONE HCL 5 MG OR TABS
5.0000 mg | ORAL_TABLET | Freq: Once | ORAL | Status: AC
Start: 2017-11-10 — End: 2017-11-10
  Administered 2017-11-10: 5 mg via ORAL
  Filled 2017-11-10: qty 1

## 2017-11-10 MED ORDER — GADOBUTROL 1 MMOL/ML IV SOLN
4.5000 mL | Freq: Once | INTRAVENOUS | Status: AC
Start: 2017-11-10 — End: 2017-11-10
  Administered 2017-11-10: 4.5 mL via INTRAVENOUS
  Filled 2017-11-10: qty 4.5

## 2017-11-10 MED ORDER — HYDROMORPHONE HCL 1 MG/ML IJ SOLN
0.5000 mg | Freq: Once | INTRAMUSCULAR | Status: AC
Start: 2017-11-10 — End: 2017-11-10
  Administered 2017-11-10: 0.5 mg via INTRAVENOUS
  Filled 2017-11-10: qty 0.5

## 2017-11-10 MED ORDER — SODIUM CHLORIDE 0.9 % IV SOLN
INTRAVENOUS | Status: DC
Start: 2017-11-11 — End: 2017-11-11
  Administered 2017-11-10: 18:00:00 via INTRAVENOUS

## 2017-11-10 NOTE — ED Notes (Signed)
Pt's family member requests to go home after MRI  Informed family member I will inform ED MD Deon Pilling

## 2017-11-10 NOTE — ED Notes (Addendum)
Rounded on pt at this time.  Pt remains alert and oriented.  No change from baseline.   Speaking clear, full sentences.  Breathing even and unlabored on RA.  Skin is pink, warm and dry.  Call bell within reach.    Per family Neurosurg saw pt and cleared him for dispo  Dr. Ferdinand Lango verbally aware

## 2017-11-10 NOTE — ED Notes (Signed)
Dr. Ferdinand Lango at the bedside to reassess the pt

## 2017-11-10 NOTE — ED Notes (Signed)
Pt back in the room

## 2017-11-10 NOTE — Interdisciplinary (Signed)
NAME Alan [name]  MRN - [id number]  DOB - Nov 19, 1945    Date - 11/10/2017    Radiation Oncology Nurses Note:  Patient accompanied by wife and family member to consult with MD Sanghvi.  Consent was obtained for treatment.  Copy was given to the patient.  Original was placed in the [place]. Contact information  given.  Task sent to [facility] for simulation.  Following consult  patient proceeded to emergency room for evaluation of severe neck pain for  the past 5-6 days.      Magdalen Spatz 11/10/2017 11:19:42 AM  Signature derived by a controlled access password

## 2017-11-10 NOTE — Discharge Instructions (Addendum)
We have placed a referral for you to the Neurosurgery Clinic. Please call the phone number provided to schedule your appointment.    Please keep the hard neck collar in place when out of bed.      Take all medications as directed. Call your doctor for follow up in 2-3 days for re-evaluation. Return to ED sooner for any worsening/progression of symptoms.

## 2017-11-10 NOTE — ED Notes (Signed)
Pt changed his mind  Would now like to stay     Pt placed on cardiac montor, HR in the 100's, alarms on and audible, ST    Pt awaiting MRI, was told that it will occur after 2000, pt aware and agreeable  c-collar remains in place

## 2017-11-10 NOTE — ED Notes (Signed)
First contact with pt, introduced self and assumed care at this time.  Sent from radiology for worsening neckpain x 1-2 weeks. Hx multiple myeloma. Was seen at [facility] on 4/28 - records not accessible - was told odontoid fracture. Scans not able to be seen. Please rule out spine instability/retropulsion. Please do a CT/MRI C spine. Discussed case with Dr. Brand Males.    Pt A & Ox4, appears to be in discomfort.  Speaking full, clear sentences.  Breathing even and unlabored on RA.  Pt's skin is pink, warm and dry.

## 2017-11-10 NOTE — ED Provider Notes (Signed)
History  The date of service for the Emergency Department encounter is 11/10/2017 11:27 AM     Patient is a 72 year old male who  has no past medical history on file. Patient presents to ED as he was referred by his oncologist, Dr. Brand Males.  Patient's history of multiple myeloma.  He has multiple foci of chronic pain, however for the past several weeks he has been having increased neck pain. No fall or trauma.  Patient presented to an outside hospital a few days ago and was diagnosed with a possible odontoid fracture.  He was placed in a cervical collar and discharged home.  Family states his pain has been getting worse.  They contacted the oncology clinic and discuss the scenario with Dr. Brand Males, who recommended the patient come to the ED for further evaluation.    Patient states currently he is not having neck pain as he continues to wear the [place] collar.  He denies any bowel or bladder incontinence.  No urinary retention.  He has longstanding numbness and tingling in his extremities, but he states this is stable and unchanged for months.  He ambulates with a walker/wheelchair at baseline.    No headache.  No fevers or chills.  No chest pain shortness of breath.  No abdominal pain. No other complaints.    PMD: Cherene Julian    No past medical history on file.   Multiple myeloma    No past surgical history on file.    No family history on file.    Social History     Tobacco Use    Smoking status: Never Smoker    Smokeless tobacco: Never Used   Substance Use Topics    Alcohol use: Not on file    Drug use: Not on file    Lives with family in [place] ago    Home Medication List  Prior to Admission Medications   Prescriptions Last Dose Informant Patient Reported? Taking?   Cobalamine Combinations (B-12) [phone] MCG SUBL   Yes No   Pomalidomide 4 MG   No No   Sig: Take 1 capsule (4 mg) by mouth daily. On days 1-21, of a 28 day cycle.   acetaminophen (TYLENOL) 325 MG tablet Taking  No Yes   Sig: Take 2 tablets  (650 mg) by mouth 1-3 hours prior to daratumumab infusions starting with SECOND daratumumab infusion.   acyclovir (ZOVIRAX) 400 MG tablet   No No   Sig: Take 1 tablet (400 mg) by mouth 2 times daily.   aspirin 81 MG chewable tablet   No No   Sig: Take 1 tablet (81 mg) by mouth daily.   calcium-vitamin D (CALTRATE 600+D) 600-400 MG-UNIT tablet   Yes No   Sig: Take 1 tablet by mouth 2 times daily.   cetirizine (ZYRTEC) 10 MG tablet   No No   Sig: Take 1 tablet by mouth 1-3 hours prior to daratumumab infusions starting with SECOND daratumumab infusion.   cyanocobalamin ER 1000 MCG tablet   No No   Sig: Take 1 tablet (1,000 mcg) by mouth daily.   dexamethasone (DECADRON) 4 MG tablet   No No   Sig: Take 5 tablets (20 mg) by mouth 1-3 hours prior to SECOND daratumumab infusion and Take 3 tablets (12 mg) by mouth 1-3 hours prior to ALL SUBSEQUENT daratumumab infusions.   montelukast (SINGULAIR) 10 MG tablet   No No   Sig: Take 1 tablet (10 mg) by mouth the day  before and 1-3 hours prior to daratumumab infusions   oxyCODONE (ROXICODONE) 5 MG immediate release tablet Taking  No Yes   Sig: Take 1 tablet (5 mg) by mouth every 6 hours as needed for Severe Pain (Pain Score 7-10).   pantoprazole (PROTONIX) 40 MG packet   No No   Sig: USE ONE PACKET AS DIRECTED AND DRINK DAILY.   potassium chloride (K-DUR) 10 MEQ Sustained-Release tablet   No No   Sig: Take 1 tablet (10 mEq) by mouth 2 times daily.      Facility-Administered Medications: None       Review of Systems:   ROS was performed with pertinent positives and negatives noted in the HPI; all other systems are negative. This was done per my custom and practice for systems appropriate to the chief complaint in an Emergency Department setting    Physical Exam     11/10/17  1125 11/10/17  1258 11/10/17  1300   BP: 105/69  117/80   Pulse: (!) 113 98 102   Resp: 22 20 23    Temp: 97.6 F (36.4 C)     SpO2: 96% 97% 98%       VS noted with tachycardia but otherwise nl, O2 sat  wnl    Pt in NAD, chronically ill-appearing  Speaks in full sentences with fluid speech   OP moist  Neck in a cervical collar  No midline cervical spine tenderness  RRR, no murms.  Not tachycardic on my exam.  Chest with nl RR, CTAB  abd soft NTND, no r/g  Back no CVAT  extrs wwp no edema  Awake alert, MAE and follows commands.  He strength 5/5 throughout.  Sensation intact to light touch throughout.  Gait not assessed.  Skin warm, dry, no rashes or lesions    Impression/MDM:   Elderly gentleman with multiple comorbidities as most notably multiple myeloma, with new, atraumatic neck pain. Per report, outside imaging currently unavailable however, states he has an odontoid fracture.  No focal findings on neurologic exam.     Initial ED Plan:   Symptom control, screening labs, maintain cervical spine immobilization.  We will also perform a cervical spine CT.  Serial neurologic exams.  Consults neurosurgery once imaging complete.    ED Course: The subsequent ED course, lab/imaging results, and final disposition of the patient are documented in a separate continuation/progress note. Please see that note for details.          Fernande Bras, MD  11/10/17 1348

## 2017-11-10 NOTE — ED Notes (Signed)
Rounded on pt at this time.  Pt remains alert and oriented.  No change from baseline.   Speaking clear, full sentences.  Breathing even and unlabored on RA.  Skin is pink, warm and dry.  Call bell within reach.    Family present at the bedside    Dr. Ferdinand Lango updates pt and family that pt's CT is concerning for new fractures  MD is pulled away for phone call from Neuro

## 2017-11-10 NOTE — Progress Notes (Signed)
Patient notes to be started in  [facility].    Patient notes will be available in Epic in Chart Review Notes Tab

## 2017-11-10 NOTE — ED Notes (Addendum)
Pt requested Oxycodone 5mg  for MRI  Pt to MRI at this time

## 2017-11-10 NOTE — ED Notes (Signed)
Pt hooked back up and family members asking for update. Family members informed we were waiting for results from CT.

## 2017-11-10 NOTE — ED Notes (Signed)
MD Bowman aware of intent to leave ED  Per MD, it will be AMA    Pt still in MRI at this time

## 2017-11-10 NOTE — ED Notes (Signed)
Pt does not wish to wait for MRI    Dr. Ferdinand Lango aware  Awaiting discharge paperwork at this time

## 2017-11-10 NOTE — ED MD Progress Note (Signed)
S/o Dr. Ferdinand Lango.  Hx MM, atraumatic neck pain, dx with Type II odontoid fx at OSH, referred by his oncologist.  CT here w multiple fx. At neuro baseline.  MRI spinal survey ordered per NSGY.  Dispo/plan pending MRI results/MSGY recs.

## 2017-11-10 NOTE — ED MD Progress Note (Signed)
MRI completed; discussed with NSGY - they reviewed images. Non-operative, outpatient f/u, hard collar when out of bed. This was discussed with the [place] attending per resident.

## 2017-11-10 NOTE — ED Notes (Signed)
Pt given update by MD at bedside. Family with no further questions. Pt placed in wheelchair and wheeled outside. Pt with no further questions.

## 2017-11-10 NOTE — ED Notes (Signed)
Report given to Tessa Lerner, RN who is assuming pt's care

## 2017-11-10 NOTE — Progress Notes (Signed)
Neurosurgery Final Recommendations    63M MM w/ 6 days of atraumatic neck pain and Type 3 dens fracture, compression fracture of C3 and compression fracture of T4. No acute neurosurgical intervention at this time. No focal neurologic deficits at this time. MRI spine survey reviewed.    Plan/Recommendations:  -Follow up in clinic with Dr. Meda Coffee first available  -Continue [place] collar when OOB  -Onc assessment to confirm MM stable    The above plan was staffed with the chief resident and attending Dr. Meda Coffee.    Alan Vanderveer S. Leonia Reader, MD  PGY2 Neurosurgery

## 2017-11-10 NOTE — ED Notes (Signed)
Pt arrives in c collar.

## 2017-11-10 NOTE — ED Notes (Signed)
Pt report received from [place]  Assuming care for pt at this time    Plan:  Pt to go on MRI   Will administer oxycodone 5mg  PO prior to MRI

## 2017-11-10 NOTE — ED Notes (Signed)
MRI tech at the bedside for screening questions

## 2017-11-10 NOTE — Consults (Signed)
Neurosurgery Consultation    Referring Attending MD:   Fernande Bras, MD    Reason for consultation:    2 weeks of atraumatic neck pain and Type 3 Dens fracture, C3 and T4 compression fractures    History of Present Illness:     47M known history of multiple myeloma followed with outpatient oncology presents with 6 days of persistent neck pain. He denies any weakness, loss of sensation, bowel/bladder incontinence. He denies any recent falls of loss of balance.    He only takes oxycodone and acetaminophen for pain.    Patient has a known history of Multiple Myeloma. Patient uses a wheel chair/walker at baseline. Diagnosed with multiple myeloma in 2017. Previously healthy prior to this diagnosis.    Denies fever chills.      Past Medical and Surgical History:  No past medical history on file.  No past surgical history on file.    Allergies:  No Known Allergies    Medications:  No current facility-administered medications for this encounter.      Current Outpatient Medications   Medication Sig    acetaminophen (TYLENOL) 325 MG tablet Take 2 tablets (650 mg) by mouth 1-3 hours prior to daratumumab infusions starting with SECOND daratumumab infusion.    acyclovir (ZOVIRAX) 400 MG tablet Take 1 tablet (400 mg) by mouth 2 times daily.    aspirin 81 MG chewable tablet Take 1 tablet (81 mg) by mouth daily.    calcium-vitamin D (CALTRATE 600+D) 600-400 MG-UNIT tablet Take 1 tablet by mouth 2 times daily.    cetirizine (ZYRTEC) 10 MG tablet Take 1 tablet by mouth 1-3 hours prior to daratumumab infusions starting with SECOND daratumumab infusion.    Cobalamine Combinations (B-12) [phone] MCG SUBL     cyanocobalamin ER 1000 MCG tablet Take 1 tablet (1,000 mcg) by mouth daily.    dexamethasone (DECADRON) 4 MG tablet Take 5 tablets (20 mg) by mouth 1-3 hours prior to SECOND daratumumab infusion and Take 3 tablets (12 mg) by mouth 1-3 hours prior to ALL SUBSEQUENT daratumumab infusions.    montelukast (SINGULAIR) 10  MG tablet Take 1 tablet (10 mg) by mouth the day before and 1-3 hours prior to daratumumab infusions    oxyCODONE (ROXICODONE) 5 MG immediate release tablet Take 1 tablet (5 mg) by mouth every 6 hours as needed for Severe Pain (Pain Score 7-10).    pantoprazole (PROTONIX) 40 MG packet USE ONE PACKET AS DIRECTED AND DRINK DAILY.    Pomalidomide 4 MG Take 1 capsule (4 mg) by mouth daily. On days 1-21, of a 28 day cycle.    potassium chloride (K-DUR) 10 MEQ Sustained-Release tablet Take 1 tablet (10 mEq) by mouth 2 times daily.       Social History:  Social History     Socioeconomic History    Marital status: Married     Spouse name: Not on file    Number of children: Not on file    Years of education: Not on file    Highest education level: Not on file   Occupational History    Not on file   Tobacco Use    Smoking status: Never Smoker    Smokeless tobacco: Never Used   Substance and Sexual Activity    Alcohol use: Not on file    Drug use: Not on file    Sexual activity: Not on file   Social Activities of Daily Living Present    Not on file  Social History Narrative    Not on file       Family History:  Non-contributory     Review of Systems:    A 10 point review of systems was performed and was negative, except as listed in HPI     Physical Exam:  BP 115/70    Pulse 102    Temp 98.3 F (36.8 C)    Resp 20    Ht _0  (1.575 m)    Wt (!) 44.9 kg (99 lb 1 oz)    SpO2 99%    BMI 18.12 kg/m     Sitting in hospital bed  Oriented x4, soft speaking male  Cervical spine tender to palpation  [place] collar in place  EOMI, Face symmetric  No drift  Full strength x4  No loss of sensation in palms or back of hands    Labs and Other Data:    Recent Labs     11/10/17  1158   NA 137   K 3.7   CL 102   BICARB 24   BUN 14   CREAT 0.89   GLU 100*   [place] 8.5     Recent Labs     11/10/17  1158   WBC 3.9*   HGB 11.7*   HCT 36.1*   PLT 190   SEG 72     Recent Labs     11/10/17  1158   PT 14.7*   INR 1.3       Imaging:    CT C Spine demonstrates type 3 odontoid fracture as well as diffusely poor bone quality  C3 and T4 Compression fractures without bony retropulsion.    Assessment:     54M MM w/ 6 days of atraumatic neck pain and Type 3 dens fracture, compression fracture of C3 and compression fracture of T4. No acute neurosurgical intervention at this time. No focal neurologic deficits at this time. MRI spine survey w/wo contrast in outpatient with subsequent clinic follow up. Strict rigid collar cervical orthosis when out of bed. Oral pain control per ED/Primary team. Outpatient onc follow up.    Plan/Recommendations:  -Outpatient MRI spine survey  -Follow up in clinic with Dr. Meda Coffee first available  -Continue [place] collar when OOB  -Onc assessment to confirm MM stable    The above plan was staffed with the chief resident and attending Dr. Sandy Salaam R. Dossie Der, MD, [place]  Neurosurgery Resident  [place], [facility] Pager [phone]

## 2017-11-10 NOTE — ED Notes (Signed)
Dr. Ferdinand Lango at the bedside to discuss his conversation with Neurosurg with the pt and family

## 2017-11-10 NOTE — ED MD Progress Note (Addendum)
Results for orders placed or performed during the hospital encounter of 46/96/29   Basic Metabolic Panel, Blood Green Plasma Separator Tube   Result Value Ref Range    Glucose 100 (H) 70 - 99 mg/dL    BUN 14 8 - 23 mg/dL    Creatinine 0.89 0.67 - 1.17 mg/dL    GFR >60 mL/min    Sodium 137 136 - 145 mmol/L    Potassium 3.7 3.5 - 5.1 mmol/L    Chloride 102 98 - 107 mmol/L    Bicarbonate 24 22 - 29 mmol/L    Anion Gap 11 7 - 15 mmol/L    Calcium 8.5 8.5 - 10.6 mg/dL   CBC w/ Diff [place]   Result Value Ref Range    WBC 3.9 (L) 4.0 - 10.0 1000/mm3    RBC 3.95 (L) 4.60 - 6.10 mill/mm3    Hgb 11.7 (L) 13.7 - 17.5 gm/dL    Hct 36.1 (L) 40.0 - 50.0 %    MCV 91.4 79.0 - 95.0 um3    MCH 29.6 26.0 - 32.0 pgm    MCHC 32.4 32.0 - 36.0 g/dL    RDW 14.7 (H) 12.0 - 14.0 %    MPV 10.0 9.4 - 12.4 fL    Plt Count 190 140 - 370 1000/mm3    Segs 72 %    Lymphocytes 19 %    Monocytes 8 %    Eosinophils 0 %    Basophils 0 %    ANC-Automated 2.8 1.6 - 7.0 1000/mm3    Abs Lymphs 0.7 (L) 0.8 - 3.1 1000/mm3    Abs Monos 0.3 0.2 - 0.8 1000/mm3    Abs Eosinophils 0.0 <0.1 - 0.5 1000/mm3    Abs Basophils 0.0 <0.1 1000/mm3    Diff Type Automated    Prothrombin Time, Blood Blue   Result Value Ref Range    PT,Patient 14.7 (H) 9.7 - 12.5 sec    INR 1.3    HCV Antibody with Reflex Quant 2 Serum Separator Tubes   Result Value Ref Range    Hepatitis C Ab Non Reactive      Chemistry normal  WBC slightly low, improved from prior  Mild anemia, improved from prior  Platelets normal  INR normal    Ct C-spine W/o Contrast    Result Date: 11/10/2017  IMPRESSION: Comminuted pathologic fracture of C2 at the base of the odontoid process but also extending into the vertebral body and lateral masses with associated impaction, posterior angulation, and resultant minimal basilar invagination of the odontoid relative to the palate opisthion line (MacGregor line), which is new since 03/29/2016. Additional interval pathologic compression fractures of C3 and T4 with 70%  and 50% height loss respectively. Additional pathologic compression fractures of the right transverse process at C1 around the transverse foramen, T2 spinous process, and left medial first rib. Worsening osseous involvement of myeloma with progressive lytic lesions and bone loss. Critical Results:  Significant findings delineated above were seen at 13:36 on 11/10/2017 and verbally communicated by Lindalou Hose to the care provider, [facility], at 13:36 on 11/10/2017.   CTRM:2001:verbal.      I was contacted by Radiology regarding the emergent cervical spine images noted above.  Patient continues to have minimal discomfort at this time and he currently declines any analgesia.  Neurosurgery team was consulted and given that he is likely not an operative candidate, they recommend to maintain strict cervical spine precautions with hard collar at all times, and  to obtain an MRI spine survey with and without contrast.     Currently awaiting MRI and final Neurosurgery Rex at this time.

## 2017-11-13 ENCOUNTER — Telehealth: Payer: Self-pay | Admitting: *Deleted

## 2017-11-13 ENCOUNTER — Encounter (HOSPITAL_BASED_OUTPATIENT_CLINIC_OR_DEPARTMENT_OTHER): Payer: Self-pay | Admitting: Radiation Oncology

## 2017-11-13 ENCOUNTER — Encounter (HOSPITAL_BASED_OUTPATIENT_CLINIC_OR_DEPARTMENT_OTHER): Payer: Self-pay | Admitting: Hospital

## 2017-11-13 ENCOUNTER — Other Ambulatory Visit: Payer: Self-pay

## 2017-11-13 DIAGNOSIS — C9000 Multiple myeloma not having achieved remission: Principal | ICD-10-CM

## 2017-11-13 IMAGING — CT CT ANGIO CHEST
2 of 7 series · 18 of 46 positions shown · IV contrast (APPLIED)
Comparison: PET-CT 03/28/2016, chest radiograph 04/25/2016

CLINICAL DATA: New diagnosis of multiple myeloma with bony lesions,
on chemotherapy and radiation therapy. Admitted with progressive
weakness, shortness of breath, pancytopenia, history TB.

EXAM:
CT ANGIOGRAPHY CHEST WITH CONTRAST
TECHNIQUE: Multidetector CT imaging of the chest was performed using the
standard protocol during bolus administration of intravenous
contrast. Multiplanar CT image reconstructions and MIPs were
obtained to evaluate the vascular anatomy. Additional precontrast
images were obtained.
CONTRAST:  75 cc Isovue 370 IV.

[Series 5: thins · axial · 0.54mm/px · z∈[-707,-452]mm · 16 of 287 slices shown]
[im 16/287  lung]
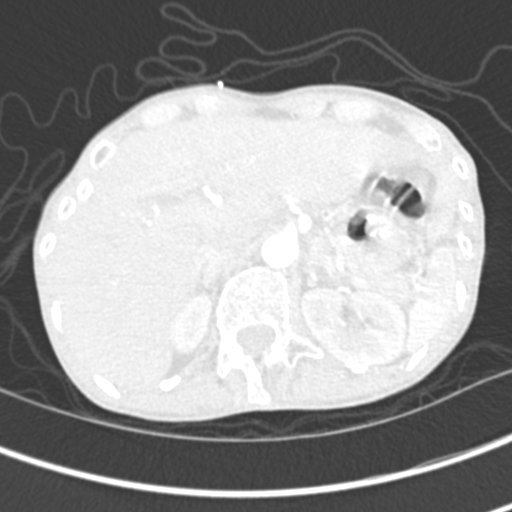
[im 32/287  soft-tissue]
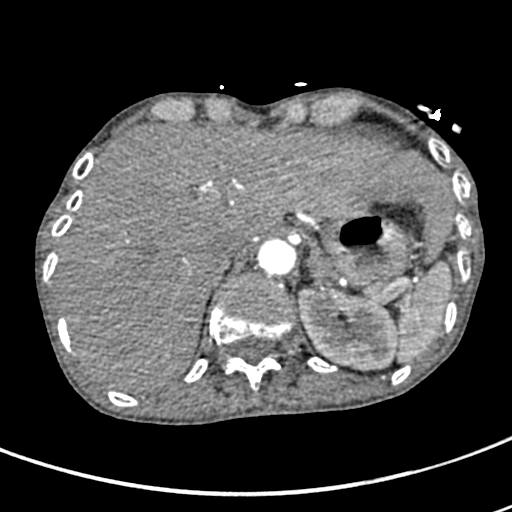
[im 48/287  lung]
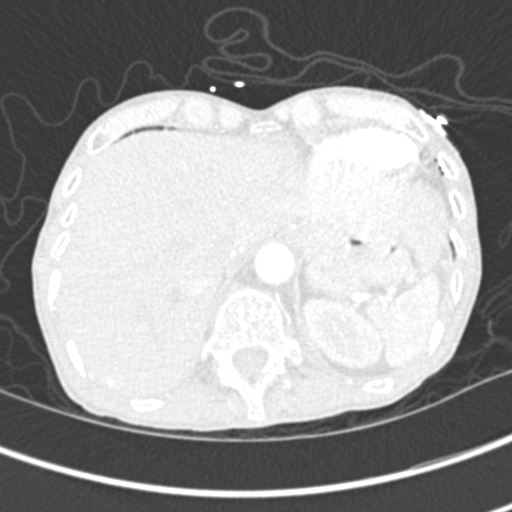
[im 64/287  soft-tissue]
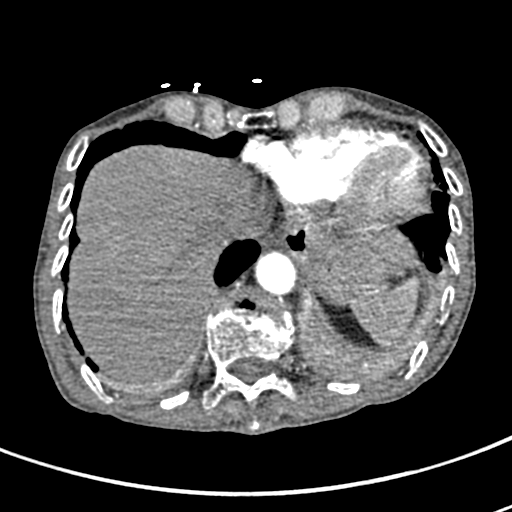
[im 80/287  lung]
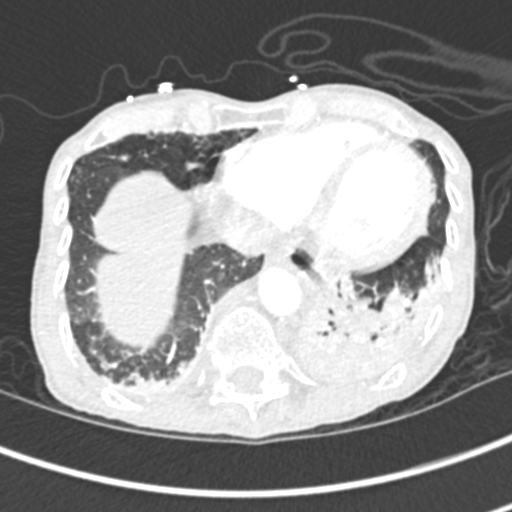
[im 96/287  soft-tissue]
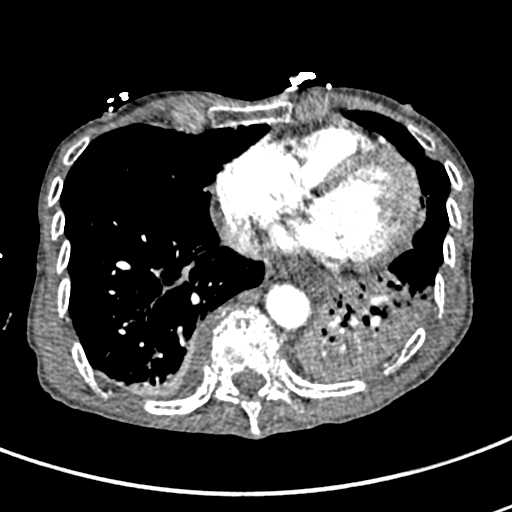
[im 112/287  lung]
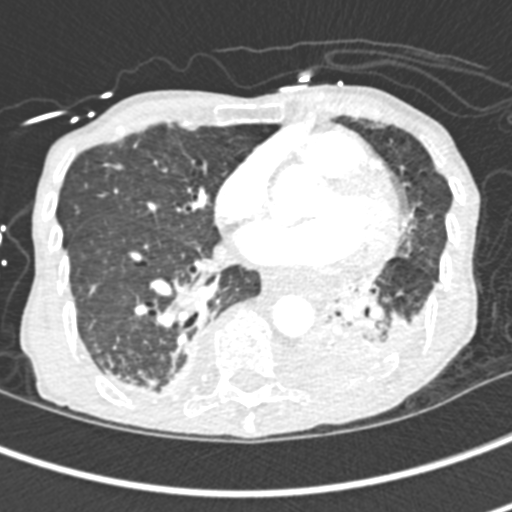
[im 128/287  soft-tissue]
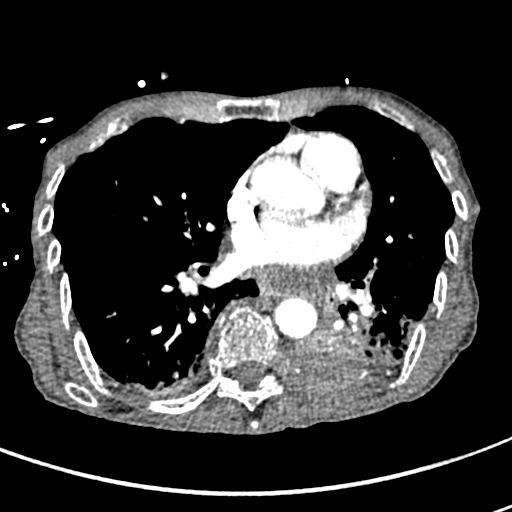
[im 159/287  lung]
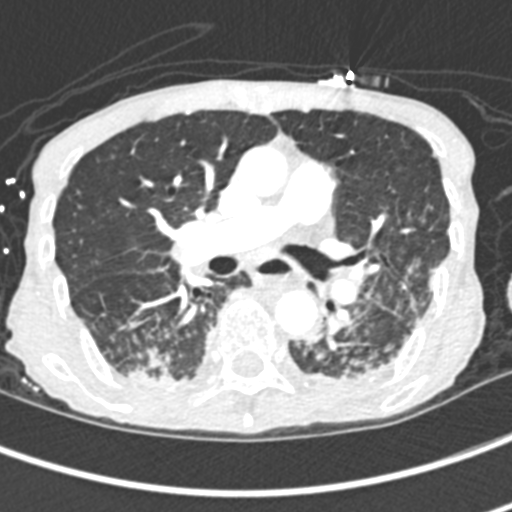
[im 175/287  soft-tissue]
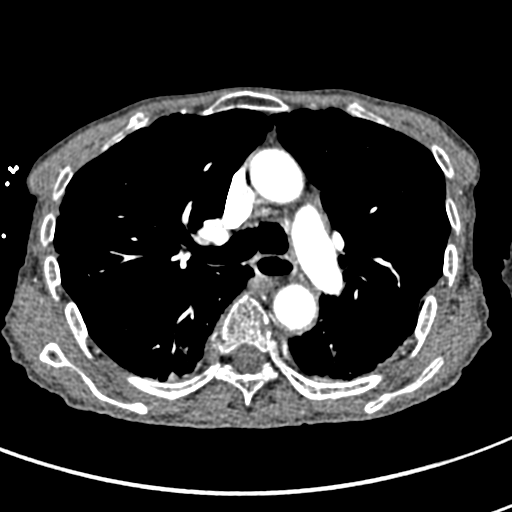
[im 191/287  lung]
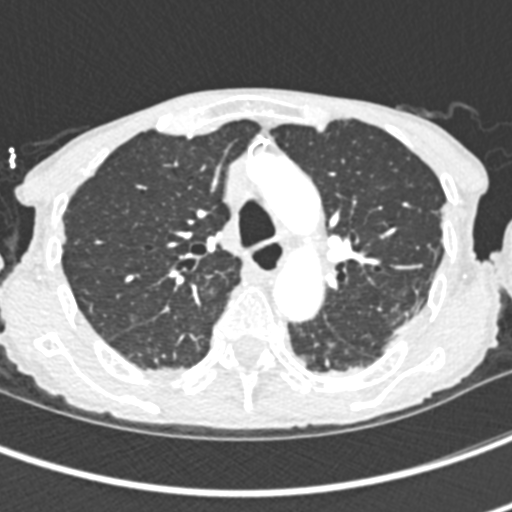
[im 207/287  soft-tissue]
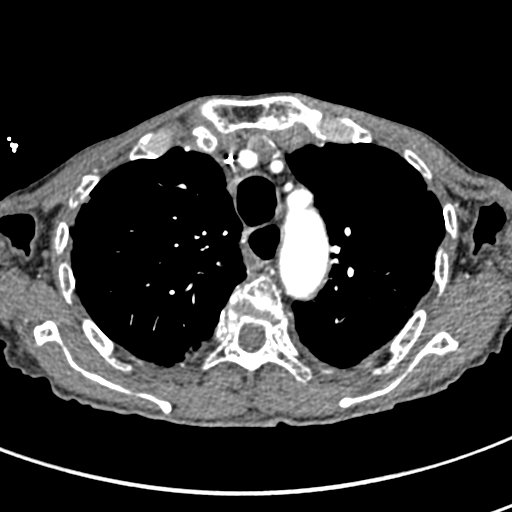
[im 223/287  lung]
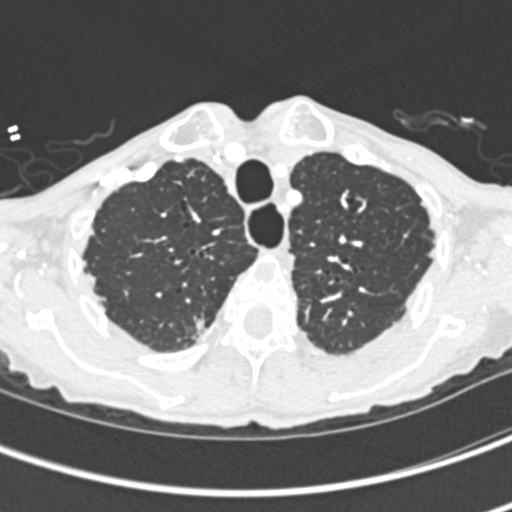
[im 239/287  soft-tissue]
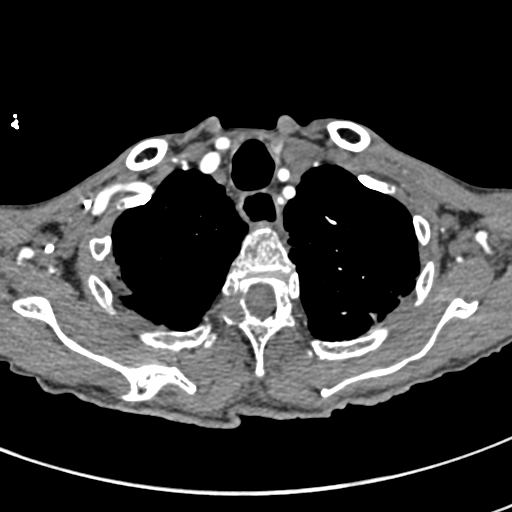
[im 255/287  lung]
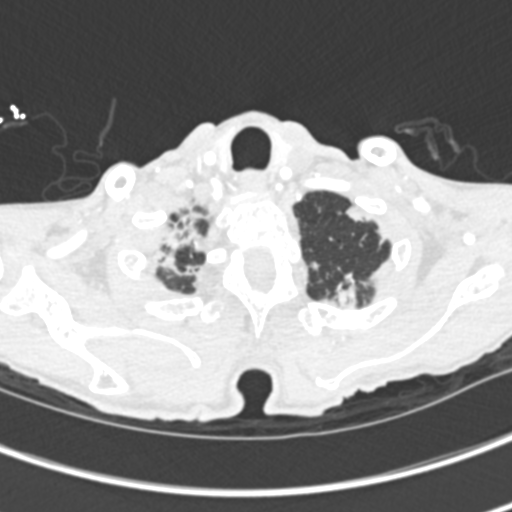
[im 271/287  soft-tissue]
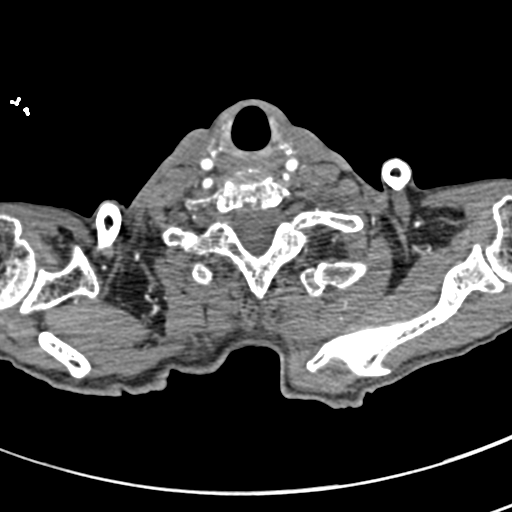

[Series 7: coronal mpr · coronal · 0.53mm/px · 2 of 66 slices shown]
[im 22/66  soft-tissue]
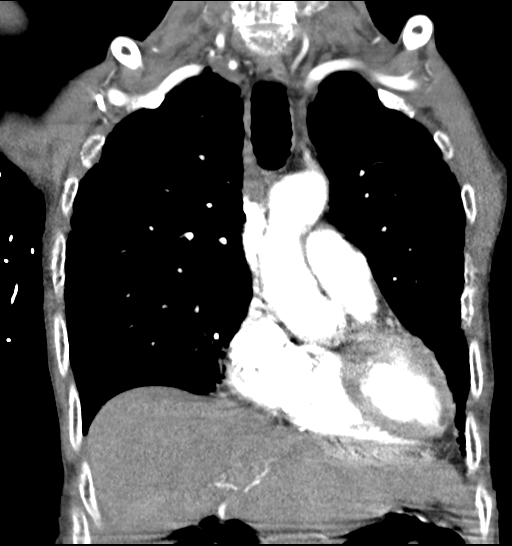
[im 44/66  soft-tissue]
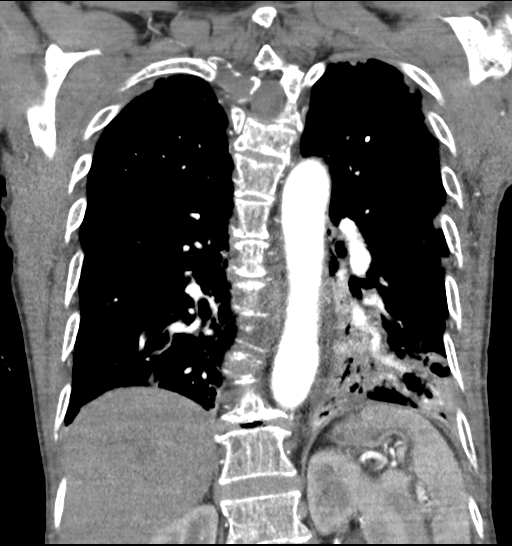

[18 of 46 positions shown; findings below may reference images not displayed]

FINDINGS: Cardiovascular: Atherosclerotic calcifications aorta without
aneurysm or dissection. Pulmonary arteries well opacified and
patent. No evidence of pulmonary embolism. No pericardial effusion.

Mediastinum/Nodes: Esophagus dilated by air and fluid. No definite
thoracic adenopathy. Base of cervical region unremarkable.

Lungs/Pleura: Lungs appear emphysematous. Slightly nodular
peribronchovascular infiltrates in both lower lobes with dense
consolidation of a large portion of LEFT lower lobe as well. Minimal
LEFT upper lobe infiltrate as well. Scattered areas of subpleural
thickening and scarring. No definite pleural effusion or
pneumothorax.

Upper Abdomen: Single low-attenuation lesion centrally in liver 13 x
10 mm diameter image 78, question cyst. Remaining visualized upper
abdomen unremarkable.

Musculoskeletal: Expansile destructive lesion of the posterior LEFT
ninth rib. Diffuse osseous demineralization. Additional scattered
areas of cortical thinning and destruction in BILATERAL ribs. Lytic
lesion at T3 vertebral body. Compression fractures at T4, T9, and
T10, suspected to be pathologic.

Review of the MIP images confirms the above findings.
IMPRESSION: No evidence of pulmonary embolism.

Aortic atherosclerosis.

BILATERAL lower lobe infiltrates LEFT greater than RIGHT.

Scattered bone lesions with thoracic spine compression fractures at
T4, T9, and T10.

## 2017-11-13 NOTE — Progress Notes (Signed)
CLINIC: Radiation Oncology    PHYSICIAN: Jeronimo Greaves,  DATE OF SERVICE: 11/13/2017    PATIENT: Alan [name]    MRN: [id number]      MD NOTE: Mr. Klang underwent a CT of the cervical spine and MRI spine  survey in the ED on 11/10/17. They show extensive lytic changes throughout  the axial skeleton. There are pathologic fractures of the C1 transverse  process, C2 odontoid process and T2 spinous process as well as compression  fractures of C3, C7, T4.      He was seen by Neurosurgery and was felt to not require any acute surgical  intervention. He was D/Ced home with a [place] collar and outpatient  evaluation with Dr. Meda Coffee    - Return for [place] with NS if any acute surgical intervention is needed or can  proceed with palliative RT        CT C spine    CLINICAL HISTORY:  History of multiple myeloma with neck pain. Concern for odontoid fracture  at outside hospital.    TECHNIQUE:  CT of the cervical spine was obtained with images reconstructed in the  orthogonal planes and viewed on bone and soft tissue algorithm. No IV  contrast was administered.    Up-to-date CT equipment and radiation dose reduction techniques were  employed. CTDIvol: 14.9 mGy. DLP: 398 mGy-cm.    COMPARISON:  Outside PET-CT from 03/29/2016. CT chest 04/26/2016.    FINDINGS:  There is extensive lytic change throughout the visualized cervical spine,  skull, ribs, and thoracic spine consistent with patient's diagnosis of  myeloma, the degree of which has progressed since 03/29/2016.    There is a pathologic fracture involving the base of the odontoid process  of C2, with minimal impaction and posterior displacement of the base of C2  relative to the odontoid as well as mild posterior angulation. The  fracture is comminuted and extends through the body/right lateral mass of  C2 with minimal retropulsion and no significant spinal canal narrowing.  There is also involvement of the left lateral mass with approximately 50 %  loss  of lateral mass height.    There is a new compression deformity of the C3 vertebral body with  approximately 70% height loss anteriorly and unchanged fracture deformity  of the C7 spinous process compared to prior PET-CT from 03/29/2016. There  is a new compression deformity of the T4 vertebral body compared to  03/29/2016 with approximately 50% height loss.    There are pathologic fractures of the right C1 transverse process adjacent  to the transverse foramen, T2 spinous process with some callus formation,  and the left medial first rib.    There is multilevel degenerative disc disease with no high-grade spinal  canal narrowing at any level. There is multilevel facet hypertrophy with  up to moderate bilateral neural foraminal narrowing at C5-6 and right  neural foraminal narrowing at C6-7.    The thyroid is unremarkable. There are no suspicious cervical lymph nodes.  There is cachexia with loss of fat volume in the cervical soft tissues.  The esophagus is patulous in appearance.        MRI Spine Survey    EXAM DESCRIPTION:  MRI SPINE SURVEY WO/W IV CONTRAST:11/10/2017 8:12 pm    CLINICAL HISTORY:  72 year old male with history of multiple myeloma, new C2 fracture noted  on CT scan.    TECHNIQUE:  MR imaging of the entire spine was performed on a 1.5 Tesla  system per  spine survey technique using a large field of view format. Sequences  include sagittal T1, T2, STIR of the cervicothoracic and thoracolumbar  spine, with additional post contrast fat saturated T1 sagittal and axial  images. 4.5 mL Gadavist intravenous contrast.    COMPARISON:  CT cervical spine 11/10/2017. Outside CT chest and abdomen/pelvis from [facility]  dated 11/05/2017.    FINDINGS:  There is edema and enhancement throughout the C2 vertebral body,  compatible with acute/subacute pathologic fracture at the base of the  odontoid process extending into the vertebral body and lateral masses with  associated impaction and posterior angulation, better demonstrated  on the  same day CT cervical spine. Enhancement within the adjacent paravertebral  soft tissues may reflect reactive edema versus soft tissue extension of  tumor.    Additionally, there is edema and enhancement within the right occipital  condyle and right lateral mass of C1 corresponding to additional  acute/subacute pathologic fractures through these regions.    There are numerous chronic pathologic fractures of the C3, T4, T8-T10  (severe at T9 and T10 with associated mild retropulsion), L3, and L4  vertebral bodies without associated edema. There is associated focal  dorsal angulation/kyphosis at the T8-T10 level.    There are innumerable T1 hypointense throughout the cervical, thoracic,  and lumbar spine as well as within the sacrum and bilateral iliac bones  compatible with known multiple myeloma.    There is no definite epidural extension of tumor although evaluation is  slightly limited due to lack of axial T2 sequences. Evaluation of the  cervical neural foramina is limited given the lack of axial T2 sequences.    There are multilevel degenerative changes of the cervical spine as well as  retrolisthesis of C3 on C4 and C4 on C5 resulting in moderate spinal canal  stenosis from C3-C4 through C5-C6. Mild spinal canal stenosis at T9-T10  related to the above described compression fractures and retropulsion. No  significant spinal canal stenosis in the lumbar spine. No cord compression  and no definite abnormal cord signal.          Signed by: Loyal Jacobson 11/11/2017 11:26:45  Impression    IMPRESSION:  1. Edema and enhancement throughout the C2 vertebral body, compatible with  acute/subacute pathologic fracture at the base of the odontoid process  extending into the vertebral body and lateral masses with associated  impaction and posterior angulation, better demonstrated on the same day CT  cervical spine. Enhancement within the adjacent paravertebral soft tissues  reflect reactive edema versus soft tissue  extension of tumor.    2. Edema and enhancement within the right occipital condyle and right  lateral mass of C1 corresponding to additional acute/subacute pathologic  fractures through these regions.    3. Numerous chronic pathologic fractures of the C3, T4, T8-T10 (severe at  T9 and T10 with associated mild retropulsion), L3, and L4 vertebral bodies  without associated edema. Associated focal dorsal angulation/kyphosis at  the T8-T10 level.    4. Innumerable T1 hypointense throughout the cervical, thoracic, and  lumbar spine as well as within the sacrum and bilateral iliac bones  compatible with known multiple myeloma.    5. No definite epidural extension of tumor although evaluation is slightly  limited due to lack of axial T2 sequences. Evaluation of the cervical  neural foramina is also limited given the lack of axial T2 sequences.    6. Multilevel degenerative changes of the cervical spine as well as  retrolisthesis of C3 on C4 and C4 on C5 resulting in moderate spinal canal  stenosis from C3-C4 through C5-C6. Mild spinal canal stenosis at T9-T10  related to the above described compression fractures and retropulsion. No  significant spinal canal stenosis in the lumbar spine. No cord compression  and no definite abnormal cord signal.          Signature derived from controlled access password  Jeronimo Greaves, MD - PID [id number]  11/13/2017 7:27:21 AM

## 2017-11-13 NOTE — Consults (Signed)
CONSULTING PHYSICIAN: Alan [name], M.D.    PATIENTDAN [name]    MEDICAL RECORD NUMBER: [id number]    DATE OF SERVICE: 11/10/2017    DATE OF BIRTH: 1946-07-10    REQUESTING PHYSICIAN: Alan Penner, MD    REASON: Alan [name]  is a 72 year old Man with multiple myeloma who  presents with worsening neck pain    HISTORY OF PRESENT ILLNESS:  This is a 72 y/o man who was initially  diagnosed with IgG kappa multiple myeloma in September 2017. He underwent  palliative radiotherapy to the thoracic spine from T9-11 from 03/24/16 ?"  04/11/16 with good relief of his pain.    He transferred his care to Dr. Sharene [name] in [place] in January 2018. He has been  on multiple systemic therapy regimens including RVD, Pom-Dex, Dara-Dex. He  has had multiple hospitalizations for failure to thrive since 2018. He is  G-tube dependent.    He was recently seen by Dr. Sharene [name] on 11/03/17 when he complained of  progressively worsening C-spine pain. He presented to an OSH ED [facility])  where he underwent a CT neck which should an odontoid fracture. I do not  have the records or imaging available today at the time of this  consultation.    He presents today to discuss palliative radiotherapy. He is in significant  pain with any range of motion. He had a very hard time getting in and out  of a car for today?Ts visit. He is in a C-spine brace.        PAST MEDICAL HISTORY:  Multiple Myeloma  Pernicious Anemia  Malaria  Tuberculosis    SURGICAL HISTORY:  Retinal Detachment Surgery 2006  PEG Placement 2017    PAST RADIATION HISTORY:  Yes, Palliative Radiation 03/24/16-04/11/16 T9-T11 ?" Done in [place]    PACEMAKER: None.    PORT/PICC: None.    CHEMOTHERAPY:  Daratumumab Weekly x 8, then Q 2 Weeks x 8, then Q 4 Weeks  D1C2 11/17/2017    MEDICATIONS:  Acetaminophen 325mg   Acyclovir 400mg   Aspirin 81mg   Calcium-Vitamin D 600-400mg   Cetirizine 10mg   Cobalamine 100-5026mcg  Cyanocobalamin ER  Dexamethasone 4mg   Montelukast 10mg   Oxycodone  5mg   Pomalidomide 4mg   Potassium Chloride    ALLERGIES: NKDA    FAMILY HISTORY: Non-Contributory    SOCIAL HISTORY: Patient is married, lives in [place] with his son .  He  and his wife moved from [place] to the [place] in 2002. He has 3 grown children.  Denies nicotine use/alcohol intake/illicit drug use. Family will drive to  visits, no issues with transportation.    REVIEW OF SYSTEMS:  A thorough review of the following systems was  performed: constitutional (fatigue,weight, sleeping),  psychological(anxiety, mood), neurological (paresthesia, weakness), head  and neck (pain, lumps), cardiovascular(chest pain, palpitations),  respiratory(cough, SOB, DOE), breast(changes, lumps),  gastrointestinal(diarrhea, blood, nausea/emesis), genitourinary (dysuria,  hematuria), musculoskeletal( joint pain, swelling), skin(pain, rashes),  endocrine(weight changes,hot flashes), hematologic(bruising, nodes), pain  (location, quality and duration), hot flashes, and intercourse.    Notable findings from this review were: ROS Constitutional - Complains of  lack of appetite, fatigue and change in weight. ROS Eyes - Denies blurred  vision, double vision and visual difficulties. ROS ENMT - Complains of  dysphagia, ear pain left ear and problems with hearing. Denies tinnitus.  ROS Neck - Complains of neck pain and decreased range of motion. wearing c  collar. ROS Integumentary - Complains of dry skin. Denies blistering and  bruising.  ROS Breasts - Denies breast masses and pain. ROS Cardiovascular  - Complains of dyspnea. Denies chest pain. ROS Respiratory - Denies cough.  ROS Gastrointestinal - Complains of change in bowel habits and  constipation. Denies nausea and vomiting. ROS Genitourinary (M) - Denies  dysuria and frequency. ROS Musculoskeletal - Complains of bone pain, joint  pain, muscle weakness and decreased range of motion. ROS Neurologic -  Complains of abnormal gait walker/wheelchair, headaches and motor weakness  BLE.  Denies dizziness. ROS Psychiatric - Denies mood swings and  depression. ROS Endocrine - Denies diabetes and thyroid disease. ROS  Hematologic/Lymphatic - Denies easy bruising and tender or enlarged lymph  nodes.    FATIGUE SCORE: 2    PERFORMANCE: 3 - Capable of only limited self-care, confined to bed or  chair more than 50% of waking hours. (ECOG)    PHYSICAL EXAM:    Vitals/Constitutional:  Well-developed chronically ill cachetic man alert  and oriented x 3 in pain.  Performed on 11/10/2017 10:02 AM  Temperature - 98.6 F  Pulse - 117 / min  Respiration - 18 / min  Systolic - 107 mm(hg)  Diastolic - 64 mm(hg)  O2 Sat - 100 %  Pain - 4  BP - 107/ 64    Head: Normocephalic, atraumatic.  Eyes: Sclerae are non-icteric.  Pupils are equal and round.  Extraocular  movements are intact.  ENMT: Oral cavity and oropharynx are without lesions. Mucous membranes are  moist.  Neck: Supple.  No palpable cervical or supraclavicular adenopathy.  Pulmonary: Clear to auscultation, bilaterally.  No rhonchi, rales or  wheezes.  Cardiovascular: Regular rate and rhythm.   No murmurs heard.  No edema.  GI: Soft, nontender and nondistended.  Extremities: No clubbing, cyanosis, or edema.  Skin: No visible lesions.  Neurologic: CN II-XII are grossly intact.  Motor strength is 5/5 in  bilateral upper and lower extremities and symmetric.    Sensory exam is  grossly intact to touch.  Gait is not tested today as in wheelchair.  MS: Tender to palpation in the entire cervical spine. ROMI is intact.  Psychiatric: Patient is alert and oriented x 3.  Normal mood and affect.    LABS:          IMAGING:  06/22/2017 PET Whole Body, PET Scheduled for 11/22/17 1PM  FINDINGS:  HEAD/NECK    No hypermetabolic activity in the neck. No hypermetabolic cervical  lymph nodes.    CHEST    No hypermetabolic mediastinal or hilar nodes. No suspicious  pulmonary nodules on the CT scan.    Airspace opacity in the right lower lobe shows mild hypermetabolism  with SUV max of  2.2. This is most likely an infiltrate.    The left-sided chest wall lesion has resolved.    ABDOMEN/PELVIS    No abnormal hypermetabolic activity within the liver, pancreas,  adrenal glands, or spleen. No hypermetabolic lymph nodes in the  abdomen or pelvis.    SKELETON    No focal hypermetabolic activity. Severe diffuse changes of multiple  myeloma with lytic lesions throughout the axial and appendicular  skeleton. No findings to suggest pathologic fracture.    EXTREMITIES    No abnormal hypermetabolic activity in the lower extremities.    IMPRESSION:  1. Severe and diffuse bony changes of multiple myeloma involving the  axial and appendicular skeleton. No areas of hypermetabolism. No  obvious pathologic fractures or spinal canal compromise.  2. Small right lower lobe infiltrate.  PATHOLOGY:  OSH BMBx 03/25/16  1. Plasma cell neoplasm with mildy atypical plasma cells (>90% of the  sampled marrow in the clot sections)  2. 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 Aneuploid for all probes. No evidence of t(4;14) or t(14;16)  3. Abnormal SNP microarray result, see separate report for details    Karyotype: 46,XY[20]  MM FISH: CCND1/IGH translocation with aneuploidy - t(11;14)      DISCUSSION WITH OUTSIDE PROVIDER: I spoke with Dr. Sharene [name] today. He  warrants urgent evaluation of his C-spine with a repeat CT/MRI neck to see  if there is any mechanical instability that requires a surgical  intervention    REVIEW AND SUMMARIZATION OF OLD RECORDS: Epic records since 07/2016    ADDITIONAL TESTS/MEDICATION ORDERS: Referral to [place] ED    IMPRESSION: Alan [name] is a 72 year old man with IgG kappa  multiple myeloma diagnosed since 07/2016 and history of prior palliative RT  to the thoracic spine T9-11 (03/2017-04/2017) in [place] who now  presents with worsening cervical spine pain. OSH apparently showed an  odontoid fracture ?" records and imaging not available.    KPS:  50    RECOMMENDATION: I will send him to the [facility] ED for an urgent evaluation to  rule out any mechanical instability of the C-spine. He has no recent  imaging in the [facility] system, so I recommend a CT and MRI C-spine to be  done.    He will warrant palliative radiotherapy to the cervical spine. I  tentatively recommend treating this to 25-30 Gy in 10-12 fractions with  VMAT based planning and daily IGRT.      PLAN: ED evaluation  - After imaging and surgical clearance, will need to return for CT  simulation.    Plan is subject to change pending further analysis.    INFORMED CONSENT:  I reviewed the rationale and goal of the proposed  treatment course. The radiotherapeutic procedure with associated side  effects and potential complications were explained. The patient,Alan  [name], had the opportunity to ask questions which were answered to the  best of my knowledge. The patient voiced understanding of the above and  has elected to proceed with treatment.    MEDICAL NECESSITY: I recommend VMAT based planning and daily IGRT to spare  the bilateral parotid glands, pharynx, larynx and esophagus.      Thank you for allowing me to participate in the care of this patient.  Please feel free to contact me with any questions or concerns.    cc: Alan Penner, MD    Signature derived from controlled access password  Alan Richard, MD - PID [id number] 11/13/2017 7:21:27 AM

## 2017-11-13 NOTE — Telephone Encounter (Signed)
Pt's son called and spoke to [place], Therapist, sports. He wanted a copy of his father's last pet scan results. I explained to him that this should be visible in mychart. He states that the trip has been post poned to come to [facility] for treatment. Pt's level of pain in neck is high. Pt unable to travel at this time. Most likely, will have new imaging in [place]. He asked me to communicate this Dr. Rogue Bussing.

## 2017-11-14 ENCOUNTER — Telehealth: Payer: Self-pay | Admitting: Internal Medicine

## 2017-11-14 ENCOUNTER — Encounter: Payer: Self-pay | Admitting: Internal Medicine

## 2017-11-14 ENCOUNTER — Other Ambulatory Visit: Payer: Self-pay

## 2017-11-14 ENCOUNTER — Encounter (HOSPITAL_BASED_OUTPATIENT_CLINIC_OR_DEPARTMENT_OTHER): Payer: Self-pay | Admitting: Hospital

## 2017-11-14 NOTE — Interdisciplinary (Signed)
NAME Alan [name]  MRN - [id number]  DOB - 29-Dec-1945    Date - 11/14/2017    Radiation Oncology Nurses Note:  Pt?Ts son would like pt to start treatment on the same day as sim.  Explained to him the planning process but he requested that I inform the  team of his request. Message sent to Dr Earnest Conroy, Dr Deloris Ping and Junious Dresser RN.              [name] 11/14/2017 4:51:30 PM  Signature derived by a controlled access password

## 2017-11-14 NOTE — Telephone Encounter (Signed)
FYI- Discussed with pt' son, [name]; patient currently in [place]-reviewed the MRI that unfortunately shows progression around C2 vertebral bodies/symptomatic.  Recommend talking to local oncologist regarding starting dexamethasone; while awaiting radiation/Pomalyst.

## 2017-11-15 ENCOUNTER — Encounter: Payer: Self-pay | Admitting: Internal Medicine

## 2017-11-15 ENCOUNTER — Other Ambulatory Visit: Payer: Self-pay

## 2017-11-16 ENCOUNTER — Other Ambulatory Visit (HOSPITAL_BASED_OUTPATIENT_CLINIC_OR_DEPARTMENT_OTHER): Payer: Self-pay | Admitting: Pharmacist

## 2017-11-16 ENCOUNTER — Inpatient Hospital Stay (HOSPITAL_BASED_OUTPATIENT_CLINIC_OR_DEPARTMENT_OTHER): Admit: 2017-11-16 | Discharge: 2017-11-16 | Disposition: A | Discharge status: Home Health | Payer: Self-pay

## 2017-11-16 ENCOUNTER — Other Ambulatory Visit: Payer: Self-pay

## 2017-11-16 DIAGNOSIS — C9000 Multiple myeloma not having achieved remission: Principal | ICD-10-CM

## 2017-11-16 NOTE — Progress Notes (Signed)
Oral Chemotherapy Refill Pharmacy Note    Indication:Multiple myeloma, remission status unspecified     Regimen Dose/Schedule: Pomalyst 4 mg #21 caps- Take 1 capsule (4 mg) by mouth daily. On days 1-21, of a 28 day cycle.      Start Date: 11/23/17    Symptoms/Toxicities Assessed: Labs reviewed. Patient tolerating medication well with no complaints.    Symptoms/Toxicities Addressed: Discussed with patient to address all experienced adverse effects with MD.    Medication Adherence Assessed: Confirmed that the patient is taking the medication as prescribed.    Medication Reconciliation Performed: Reviewed patient's medication profile for any changes.

## 2017-11-17 ENCOUNTER — Inpatient Hospital Stay (HOSPITAL_BASED_OUTPATIENT_CLINIC_OR_DEPARTMENT_OTHER): Admit: 2017-11-17 | Discharge: 2017-11-17 | Disposition: A | Discharge status: Home Health | Payer: Self-pay

## 2017-11-17 ENCOUNTER — Encounter (HOSPITAL_BASED_OUTPATIENT_CLINIC_OR_DEPARTMENT_OTHER): Payer: Self-pay

## 2017-11-17 ENCOUNTER — Ambulatory Visit
Admit: 2017-11-17 | Discharge: 2017-11-17 | Disposition: A | Discharge status: Home / Self Care | Payer: Medicare Other | Attending: Hematology & Oncology | Admitting: Hematology & Oncology

## 2017-11-20 ENCOUNTER — Inpatient Hospital Stay (HOSPITAL_BASED_OUTPATIENT_CLINIC_OR_DEPARTMENT_OTHER): Admit: 2017-11-20 | Discharge: 2017-11-20 | Disposition: A | Discharge status: Home Health | Payer: Self-pay

## 2017-11-21 ENCOUNTER — Other Ambulatory Visit: Payer: Self-pay

## 2017-11-21 ENCOUNTER — Inpatient Hospital Stay (HOSPITAL_BASED_OUTPATIENT_CLINIC_OR_DEPARTMENT_OTHER): Admit: 2017-11-21 | Discharge: 2017-11-21 | Disposition: A | Discharge status: Home Health | Payer: Self-pay

## 2017-11-21 ENCOUNTER — Encounter (HOSPITAL_BASED_OUTPATIENT_CLINIC_OR_DEPARTMENT_OTHER): Payer: Self-pay | Admitting: Hospital

## 2017-11-21 NOTE — Progress Notes (Signed)
[facility], Radiation Oncology  Weekly Management Eval ?" OTV    Patient: Alan [name]    DOB: [birth year]    MRN: [id number]    Date of Evaluation: 11/21/2017    Physician: Abran Richard,  Referring Physician: Orlando Penner    Radiation:  1 C1-T3 Spine (Plan ID - 1b SpineLower) - Rx Dose 2,000cGy (Status -  Treatment Approved) - 4 / 5  1 C1-T3 Spine (Plan ID - 1a SpineUpper) - Rx Dose 2,000cGy (Status -  Treatment Approved) - 4 / 5    Vitals: Performed on 11/21/2017 9:39 AM  Temperature - 97.6 F  Pulse - 112 / min  Respiration - 16 / min  Systolic - 105 mm(hg)  Diastolic - 76 mm(hg)  O2 Sat - 99 %  BP - 105/ 76    Fatigue Score: 3    Performance: 3 - Capable of only limited self-care, confined to bed or  chair more than 50% of waking hours. (ECOG)    Nurse?Ts Note: Not seen by nursing. Plan for follow-up with MD Sharene Skeans.  C.SherwoodRN    MD Note: Tolerating RT well. Pain has improved slightly. Wearing C spine  collar. F/U with Dr. Sharene Skeans. RTC as needed    Physical Exam:  Gen: WD/WN man A&O X3 in NAD  Treatment Area: cervico-thoracic spine C1-T3    Films:  Checked and approved for this week.    Assessment: Patient is tolerating treatment well.    Plan: I have reviewed the treatment plan and recommend continuation of  treatment as prescribed.    Signature derived from controlled access password  Abran Richard, MD - PID [id number]  11/21/2017 5:27:46 PM

## 2017-11-22 ENCOUNTER — Ambulatory Visit (HOSPITAL_BASED_OUTPATIENT_CLINIC_OR_DEPARTMENT_OTHER): Admit: 2017-11-22 | Payer: Medicare Other

## 2017-11-22 ENCOUNTER — Ambulatory Visit (HOSPITAL_BASED_OUTPATIENT_CLINIC_OR_DEPARTMENT_OTHER): Payer: Medicare Other

## 2017-11-22 ENCOUNTER — Inpatient Hospital Stay (HOSPITAL_BASED_OUTPATIENT_CLINIC_OR_DEPARTMENT_OTHER): Admit: 2017-11-22 | Discharge: 2017-11-22 | Disposition: A | Discharge status: Home Health | Payer: Self-pay

## 2017-11-24 ENCOUNTER — Ambulatory Visit (HOSPITAL_BASED_OUTPATIENT_CLINIC_OR_DEPARTMENT_OTHER): Payer: Medicare Other

## 2017-11-25 NOTE — Procedures (Addendum)
[facility]  Radiation Oncology  [address]  [place], [place] [address]  [phone] Fax [phone]    Radiation Therapy Summary    Patient: Alan [name]  Medical Record Number: [id number]  Date of Birth: 06-04-1946  Radiation Oncologist: Jeronimo Greaves  Referring Physician: Clancy Gourd    Course: 1 C1-T3 Spine  Treatment Site: 1b SpineLower  Ref. ID: C1-T3 Lower  Energy: 15X  Dose/Fx (cGy): 400  #Fx: 5 / 5  Dose Correction (cGy): 0  Total Dose (cGy): 2,000  Start Date: 11/16/2017  End Date: 11/22/2017  Elapsed Days: 6    Course: 1 C1-T3 Spine  Treatment Site: 1a SpineUpper  Ref. ID: C1-T3 Upper  Energy: 15X  Dose/Fx (cGy): 400  #Fx: 5 / 5  Dose Correction (cGy): 0  Total Dose (cGy): 2,000  Start Date: 11/16/2017  End Date: 11/22/2017  Elapsed Days: 6    C1-T3 Lower - Planned Dose 2,000cGy - Actual Dose 2,000cGy  C1-T3 Upper - Planned Dose 2,000cGy - Actual Dose 2,000cGy    Course and Elapsed Days:1 C1-T3 Spine, 6, day(s).  Treatment Dates: 11/16/2017 - 11/22/2017    Treatment Summary: Alan [name] underwent palliative radiotherapy to  the cervico-thoracic spine from C1-T3 for multiple myeloma. This was  treated to 20 Gy in 5 fractions with 3D conformal radiotherapy.    Impression and Plan: Please reference to the weekly on treatment notes for  an assessment of tumor response and toxicity. Patient was instructed to  call with any questions or concerns in the interim and follow-up with Dr.  Dr. Brand Males. RTC as needed.    Signature derived from controlled access password  Jeronimo Greaves, MD - PID [id number]  11/25/2017 6:07:30 PM

## 2017-11-27 ENCOUNTER — Telehealth: Payer: Self-pay | Admitting: Internal Medicine

## 2017-11-27 ENCOUNTER — Encounter: Payer: Self-pay | Admitting: Internal Medicine

## 2017-11-27 ENCOUNTER — Encounter (HOSPITAL_BASED_OUTPATIENT_CLINIC_OR_DEPARTMENT_OTHER): Payer: Self-pay | Admitting: Hospital

## 2017-11-27 NOTE — Telephone Encounter (Signed)
Spoke to pt's son re: pt care.  Recommend continued medical care in [place].   Patient's son will inform us next week-so that I could speak to the patient myself.

## 2017-11-30 ENCOUNTER — Encounter (HOSPITAL_BASED_OUTPATIENT_CLINIC_OR_DEPARTMENT_OTHER): Payer: Self-pay | Admitting: Hospital

## 2017-12-01 ENCOUNTER — Ambulatory Visit (HOSPITAL_BASED_OUTPATIENT_CLINIC_OR_DEPARTMENT_OTHER): Admit: 2017-12-01 | Discharge: 2017-12-01 | Disposition: A | Discharge status: Home Health | Payer: Medicare Other

## 2017-12-01 ENCOUNTER — Ambulatory Visit (HOSPITAL_BASED_OUTPATIENT_CLINIC_OR_DEPARTMENT_OTHER): Payer: Medicare Other | Admitting: Hematology & Oncology

## 2017-12-05 ENCOUNTER — Telehealth: Payer: Self-pay | Admitting: Internal Medicine

## 2017-12-05 ENCOUNTER — Encounter: Payer: Self-pay | Admitting: Internal Medicine

## 2017-12-05 ENCOUNTER — Encounter (HOSPITAL_BASED_OUTPATIENT_CLINIC_OR_DEPARTMENT_OTHER): Payer: Self-pay | Admitting: Hospital

## 2017-12-05 NOTE — Telephone Encounter (Signed)
Spoke to pt at the request of family; recommend continue current therapy in [place]. Also spoke to son, [name]- discussed my recommendations.

## 2017-12-06 ENCOUNTER — Encounter (HOSPITAL_BASED_OUTPATIENT_CLINIC_OR_DEPARTMENT_OTHER): Payer: Self-pay | Admitting: Hospital

## 2017-12-07 ENCOUNTER — Ambulatory Visit (HOSPITAL_BASED_OUTPATIENT_CLINIC_OR_DEPARTMENT_OTHER): Payer: Medicare Other

## 2017-12-08 ENCOUNTER — Ambulatory Visit (HOSPITAL_BASED_OUTPATIENT_CLINIC_OR_DEPARTMENT_OTHER): Admit: 2017-12-08 | Discharge: 2017-12-08 | Disposition: A | Discharge status: Home Health | Payer: Medicare Other

## 2017-12-08 NOTE — Telephone Encounter (Signed)
Called pt's son [name] r/t [employer] received. He states that pt's neck pain has improved, so he has not tried lidocaine patches yet, but is now complaining of body aches. Instructed him to try OTC tylenol, as pt does not want to take any narcotics at this time. Let him know that I have sent lab orders and hydration orders to [facility] as requested. Booked pt for f/u with Dr. Brand Males on Wed 6/12 @ 1p. Encouraged him to schedule PET scan for the same day to save a trip. No further questions at time of call.

## 2017-12-09 ENCOUNTER — Ambulatory Visit (HOSPITAL_BASED_OUTPATIENT_CLINIC_OR_DEPARTMENT_OTHER): Payer: Medicare Other

## 2017-12-11 ENCOUNTER — Encounter (HOSPITAL_BASED_OUTPATIENT_CLINIC_OR_DEPARTMENT_OTHER): Payer: Self-pay | Admitting: Hospital

## 2017-12-14 ENCOUNTER — Encounter (HOSPITAL_BASED_OUTPATIENT_CLINIC_OR_DEPARTMENT_OTHER): Payer: Self-pay | Admitting: Hospital

## 2017-12-15 ENCOUNTER — Encounter (HOSPITAL_BASED_OUTPATIENT_CLINIC_OR_DEPARTMENT_OTHER): Payer: Self-pay

## 2017-12-15 ENCOUNTER — Encounter (HOSPITAL_BASED_OUTPATIENT_CLINIC_OR_DEPARTMENT_OTHER): Payer: Self-pay | Admitting: Hospital

## 2017-12-15 NOTE — Telephone Encounter (Signed)
Called pt's son [name] r/t [employer]. Let him know that I called [facility] who states they will fax over lab results for myeloma labs from 6/5 as soon as they get them. Per [name], pt is currently inpatient at [facility] d/t dehydration/ failure to thrive getting hydration and is planned to get gtube placed either tonight or tomorrow. [name] states he will call back on Monday with update.

## 2017-12-18 ENCOUNTER — Encounter (HOSPITAL_BASED_OUTPATIENT_CLINIC_OR_DEPARTMENT_OTHER): Payer: Self-pay | Admitting: Hospital

## 2017-12-19 ENCOUNTER — Encounter (HOSPITAL_BASED_OUTPATIENT_CLINIC_OR_DEPARTMENT_OTHER): Payer: Self-pay | Admitting: Hospital

## 2017-12-20 ENCOUNTER — Ambulatory Visit (HOSPITAL_BASED_OUTPATIENT_CLINIC_OR_DEPARTMENT_OTHER): Payer: Medicare Other | Admitting: Hematology & Oncology

## 2017-12-20 NOTE — Telephone Encounter (Signed)
Called and spoke with pt's son, [name]. Reviewed MM labs drawn by [facility] with [facility]. Discussed that pt was sent home on [facility] with [facility]. Explained that they will be taking over care, and we will no longer be ordering labs. Understanding verbalized. [name] states that he will let us know if they wish to return to [place] and resume treatment. No further questions at time of call.

## 2017-12-25 ENCOUNTER — Other Ambulatory Visit: Payer: Self-pay

## 2018-01-01 ENCOUNTER — Encounter: Payer: Self-pay | Admitting: Internal Medicine

## 2018-01-04 ENCOUNTER — Other Ambulatory Visit: Payer: Self-pay

## 2018-01-05 ENCOUNTER — Other Ambulatory Visit: Payer: Self-pay

## 2018-01-25 ENCOUNTER — Encounter (HOSPITAL_BASED_OUTPATIENT_CLINIC_OR_DEPARTMENT_OTHER): Payer: Self-pay | Admitting: Hospital

## 2018-02-20 ENCOUNTER — Other Ambulatory Visit: Payer: Self-pay

## 2018-02-22 ENCOUNTER — Other Ambulatory Visit: Payer: Self-pay

## 2018-03-08 ENCOUNTER — Other Ambulatory Visit: Payer: Self-pay

## 2018-11-13 ENCOUNTER — Other Ambulatory Visit: Payer: Self-pay

## 2019-07-26 ENCOUNTER — Encounter: Payer: Self-pay | Admitting: Hospital

## 2019-08-19 ENCOUNTER — Encounter (INDEPENDENT_AMBULATORY_CARE_PROVIDER_SITE_OTHER): Payer: Self-pay

## 2019-08-19 ENCOUNTER — Encounter (INDEPENDENT_AMBULATORY_CARE_PROVIDER_SITE_OTHER): Payer: Self-pay | Admitting: Hospital

## 2019-08-27 ENCOUNTER — Encounter (INDEPENDENT_AMBULATORY_CARE_PROVIDER_SITE_OTHER): Payer: Self-pay | Admitting: Hospital
